# Patient Record
Sex: Male | Born: 1960 | Race: White | Hispanic: No | Smoking: Never smoker
Health system: Southern US, Community
[De-identification: ages and names within clinical notes are randomized; demographics above are authoritative.]

## PROBLEM LIST (undated history)

## (undated) DIAGNOSIS — I10 Essential (primary) hypertension: Secondary | ICD-10-CM

## (undated) DIAGNOSIS — I2510 Atherosclerotic heart disease of native coronary artery without angina pectoris: Secondary | ICD-10-CM

## (undated) DIAGNOSIS — F419 Anxiety disorder, unspecified: Secondary | ICD-10-CM

## (undated) DIAGNOSIS — F329 Major depressive disorder, single episode, unspecified: Secondary | ICD-10-CM

## (undated) DIAGNOSIS — M792 Neuralgia and neuritis, unspecified: Secondary | ICD-10-CM

## (undated) DIAGNOSIS — I4892 Unspecified atrial flutter: Secondary | ICD-10-CM

## (undated) DIAGNOSIS — F32A Depression, unspecified: Secondary | ICD-10-CM

## (undated) DIAGNOSIS — I428 Other cardiomyopathies: Secondary | ICD-10-CM

## (undated) DIAGNOSIS — M545 Low back pain: Secondary | ICD-10-CM

## (undated) DIAGNOSIS — M1990 Unspecified osteoarthritis, unspecified site: Secondary | ICD-10-CM

## (undated) DIAGNOSIS — Z789 Other specified health status: Secondary | ICD-10-CM

## (undated) DIAGNOSIS — R972 Elevated prostate specific antigen [PSA]: Secondary | ICD-10-CM

## (undated) DIAGNOSIS — E785 Hyperlipidemia, unspecified: Secondary | ICD-10-CM

## (undated) DIAGNOSIS — N401 Enlarged prostate with lower urinary tract symptoms: Secondary | ICD-10-CM

## (undated) DIAGNOSIS — N138 Other obstructive and reflux uropathy: Secondary | ICD-10-CM

## (undated) DIAGNOSIS — Z9989 Dependence on other enabling machines and devices: Secondary | ICD-10-CM

## (undated) DIAGNOSIS — E291 Testicular hypofunction: Secondary | ICD-10-CM

## (undated) DIAGNOSIS — G4733 Obstructive sleep apnea (adult) (pediatric): Secondary | ICD-10-CM

## (undated) DIAGNOSIS — K219 Gastro-esophageal reflux disease without esophagitis: Secondary | ICD-10-CM

## (undated) DIAGNOSIS — D509 Iron deficiency anemia, unspecified: Principal | ICD-10-CM

## (undated) HISTORY — DX: Dependence on other enabling machines and devices: Z99.89

## (undated) HISTORY — PX: TRANSTHORACIC ECHOCARDIOGRAM: SHX275

## (undated) HISTORY — DX: Other obstructive and reflux uropathy: N13.8

## (undated) HISTORY — DX: Low back pain: M54.5

## (undated) HISTORY — DX: Iron deficiency anemia, unspecified: D50.9

## (undated) HISTORY — DX: Unspecified atrial flutter: I48.92

## (undated) HISTORY — PX: TONSILLECTOMY: SUR1361

## (undated) HISTORY — DX: Testicular hypofunction: E29.1

## (undated) HISTORY — DX: Other obstructive and reflux uropathy: N40.1

## (undated) HISTORY — DX: Other specified health status: Z78.9

## (undated) HISTORY — PX: ADENOIDECTOMY: SUR15

## (undated) HISTORY — DX: Elevated prostate specific antigen (PSA): R97.20

## (undated) HISTORY — DX: Major depressive disorder, single episode, unspecified: F32.9

## (undated) HISTORY — DX: Hyperlipidemia, unspecified: E78.5

## (undated) HISTORY — DX: Neuralgia and neuritis, unspecified: M79.2

## (undated) HISTORY — DX: Essential (primary) hypertension: I10

## (undated) HISTORY — DX: Depression, unspecified: F32.A

## (undated) HISTORY — DX: Gastro-esophageal reflux disease without esophagitis: K21.9

## (undated) HISTORY — DX: Obstructive sleep apnea (adult) (pediatric): G47.33

---
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
## 2003-11-30 ENCOUNTER — Ambulatory Visit: Payer: Self-pay | Admitting: Internal Medicine

## 2003-12-15 ENCOUNTER — Ambulatory Visit: Payer: Self-pay | Admitting: Internal Medicine

## 2004-01-20 ENCOUNTER — Ambulatory Visit: Payer: Self-pay | Admitting: Internal Medicine

## 2004-01-28 ENCOUNTER — Ambulatory Visit: Payer: Self-pay

## 2004-01-29 ENCOUNTER — Ambulatory Visit: Payer: Self-pay

## 2004-02-04 ENCOUNTER — Ambulatory Visit: Payer: Self-pay | Admitting: Internal Medicine

## 2004-07-28 ENCOUNTER — Ambulatory Visit: Payer: Self-pay | Admitting: Internal Medicine

## 2004-08-19 ENCOUNTER — Ambulatory Visit: Payer: Self-pay | Admitting: Internal Medicine

## 2004-10-10 ENCOUNTER — Ambulatory Visit: Payer: Self-pay | Admitting: Internal Medicine

## 2004-10-17 ENCOUNTER — Ambulatory Visit: Payer: Self-pay | Admitting: Internal Medicine

## 2005-01-20 ENCOUNTER — Ambulatory Visit: Payer: Self-pay | Admitting: Internal Medicine

## 2005-02-14 ENCOUNTER — Ambulatory Visit: Payer: Self-pay | Admitting: Internal Medicine

## 2005-05-16 ENCOUNTER — Ambulatory Visit: Payer: Self-pay | Admitting: Internal Medicine

## 2005-07-07 ENCOUNTER — Ambulatory Visit: Payer: Self-pay

## 2005-07-07 ENCOUNTER — Encounter: Payer: Self-pay | Admitting: Cardiology

## 2005-08-10 ENCOUNTER — Ambulatory Visit: Payer: Self-pay | Admitting: Internal Medicine

## 2006-01-03 ENCOUNTER — Ambulatory Visit: Payer: Self-pay | Admitting: Internal Medicine

## 2006-01-03 LAB — CONVERTED CEMR LAB
ALT: 27 units/L (ref 0–40)
AST: 22 units/L (ref 0–37)
BUN: 13 mg/dL (ref 6–23)
Creatinine, Ser: 1.1 mg/dL (ref 0.4–1.5)
Creatinine,U: 101.8 mg/dL
Hgb A1c MFr Bld: 6.8 % — ABNORMAL HIGH (ref 4.6–6.0)
Microalb Creat Ratio: 7.9 mg/g (ref 0.0–30.0)
Microalb, Ur: 0.8 mg/dL (ref 0.0–1.9)
Potassium: 3.8 meq/L (ref 3.5–5.1)

## 2006-05-22 DIAGNOSIS — I10 Essential (primary) hypertension: Secondary | ICD-10-CM | POA: Insufficient documentation

## 2006-05-22 DIAGNOSIS — Z9089 Acquired absence of other organs: Secondary | ICD-10-CM

## 2006-07-19 ENCOUNTER — Ambulatory Visit: Payer: Self-pay | Admitting: Internal Medicine

## 2006-07-19 DIAGNOSIS — R51 Headache: Secondary | ICD-10-CM

## 2006-07-19 DIAGNOSIS — E8881 Metabolic syndrome: Secondary | ICD-10-CM

## 2006-07-19 DIAGNOSIS — R519 Headache, unspecified: Secondary | ICD-10-CM | POA: Insufficient documentation

## 2006-07-26 ENCOUNTER — Ambulatory Visit: Payer: Self-pay | Admitting: Internal Medicine

## 2006-08-05 ENCOUNTER — Encounter: Payer: Self-pay | Admitting: Internal Medicine

## 2006-08-05 LAB — CONVERTED CEMR LAB
ALT: 28 units/L (ref 0–53)
AST: 20 units/L (ref 0–37)
BUN: 10 mg/dL (ref 6–23)
Cholesterol: 159 mg/dL (ref 0–200)
Creatinine, Ser: 0.9 mg/dL (ref 0.4–1.5)
Direct LDL: 96.7 mg/dL
HDL: 22.8 mg/dL — ABNORMAL LOW (ref >39.0)
Hgb A1c MFr Bld: 7.3 % — ABNORMAL HIGH (ref 4.6–6.0)
Potassium: 4.1 meq/L (ref 3.5–5.1)
Total CHOL/HDL Ratio: 7
Triglycerides: 229 mg/dL (ref 0–149)
VLDL: 46 mg/dL — ABNORMAL HIGH (ref 0–40)

## 2006-08-06 ENCOUNTER — Encounter (INDEPENDENT_AMBULATORY_CARE_PROVIDER_SITE_OTHER): Payer: Self-pay | Admitting: *Deleted

## 2006-12-03 ENCOUNTER — Ambulatory Visit: Payer: Self-pay | Admitting: Internal Medicine

## 2007-04-04 ENCOUNTER — Telehealth (INDEPENDENT_AMBULATORY_CARE_PROVIDER_SITE_OTHER): Payer: Self-pay | Admitting: *Deleted

## 2007-05-28 ENCOUNTER — Emergency Department (HOSPITAL_COMMUNITY): Admission: EM | Admit: 2007-05-28 | Discharge: 2007-05-28 | Payer: Self-pay | Admitting: Emergency Medicine

## 2007-05-28 ENCOUNTER — Telehealth (INDEPENDENT_AMBULATORY_CARE_PROVIDER_SITE_OTHER): Payer: Self-pay | Admitting: *Deleted

## 2007-07-29 ENCOUNTER — Telehealth (INDEPENDENT_AMBULATORY_CARE_PROVIDER_SITE_OTHER): Payer: Self-pay | Admitting: *Deleted

## 2007-08-05 ENCOUNTER — Telehealth: Payer: Self-pay | Admitting: Internal Medicine

## 2007-08-06 ENCOUNTER — Encounter (INDEPENDENT_AMBULATORY_CARE_PROVIDER_SITE_OTHER): Payer: Self-pay | Admitting: *Deleted

## 2007-08-06 ENCOUNTER — Ambulatory Visit: Payer: Self-pay | Admitting: Internal Medicine

## 2007-08-06 DIAGNOSIS — R5383 Other fatigue: Secondary | ICD-10-CM

## 2007-08-06 DIAGNOSIS — R5381 Other malaise: Secondary | ICD-10-CM | POA: Insufficient documentation

## 2007-08-12 ENCOUNTER — Ambulatory Visit: Payer: Self-pay | Admitting: Cardiology

## 2007-08-12 LAB — CONVERTED CEMR LAB
Albumin: 3.9 g/dL (ref 3.5–5.2)
Alkaline Phosphatase: 57 units/L (ref 39–117)
Basophils Absolute: 0 10*3/uL (ref 0.0–0.1)
Basophils Relative: 0.7 % (ref 0.0–3.0)
Bilirubin, Direct: 0.1 mg/dL (ref 0.0–0.3)
Cholesterol: 168 mg/dL (ref 0–200)
Creatinine, Ser: 0.9 mg/dL (ref 0.4–1.5)
Eosinophils Absolute: 0.2 10*3/uL (ref 0.0–0.7)
Eosinophils Relative: 2.9 % (ref 0.0–5.0)
HCT: 44.1 % (ref 39.0–52.0)
HDL: 27.1 mg/dL — ABNORMAL LOW (ref >39.0)
Hgb A1c MFr Bld: 6.8 % — ABNORMAL HIGH (ref 4.6–6.0)
MCHC: 34.5 g/dL (ref 30.0–36.0)
MCV: 88.2 fL (ref 78.0–100.0)
Microalb Creat Ratio: 2 mg/g (ref 0.0–30.0)
Microalb, Ur: 0.2 mg/dL (ref 0.0–1.9)
Monocytes Absolute: 0.3 10*3/uL (ref 0.1–1.0)
Neutrophils Relative %: 61 % (ref 43.0–77.0)
Platelets: 173 10*3/uL (ref 150–400)
Total Protein: 6.9 g/dL (ref 6.0–8.3)
Triglycerides: 134 mg/dL (ref 0–149)
VLDL: 27 mg/dL (ref 0–40)

## 2007-08-23 ENCOUNTER — Ambulatory Visit: Payer: Self-pay | Admitting: Pulmonary Disease

## 2007-08-23 DIAGNOSIS — G4733 Obstructive sleep apnea (adult) (pediatric): Secondary | ICD-10-CM

## 2007-09-05 ENCOUNTER — Ambulatory Visit: Payer: Self-pay

## 2007-09-05 ENCOUNTER — Encounter: Payer: Self-pay | Admitting: Internal Medicine

## 2007-09-11 ENCOUNTER — Ambulatory Visit: Payer: Self-pay

## 2007-09-24 ENCOUNTER — Encounter: Payer: Self-pay | Admitting: Pulmonary Disease

## 2007-09-24 ENCOUNTER — Ambulatory Visit (HOSPITAL_BASED_OUTPATIENT_CLINIC_OR_DEPARTMENT_OTHER): Admission: RE | Admit: 2007-09-24 | Discharge: 2007-09-24 | Payer: Self-pay | Admitting: Pulmonary Disease

## 2007-10-01 ENCOUNTER — Ambulatory Visit: Payer: Self-pay | Admitting: Pulmonary Disease

## 2007-10-02 ENCOUNTER — Telehealth (INDEPENDENT_AMBULATORY_CARE_PROVIDER_SITE_OTHER): Payer: Self-pay | Admitting: *Deleted

## 2007-10-04 ENCOUNTER — Ambulatory Visit: Payer: Self-pay | Admitting: Pulmonary Disease

## 2007-11-19 ENCOUNTER — Ambulatory Visit: Payer: Self-pay | Admitting: Pulmonary Disease

## 2007-11-25 ENCOUNTER — Telehealth (INDEPENDENT_AMBULATORY_CARE_PROVIDER_SITE_OTHER): Payer: Self-pay | Admitting: *Deleted

## 2008-01-27 ENCOUNTER — Telehealth (INDEPENDENT_AMBULATORY_CARE_PROVIDER_SITE_OTHER): Payer: Self-pay | Admitting: *Deleted

## 2008-02-12 ENCOUNTER — Encounter: Payer: Self-pay | Admitting: Pulmonary Disease

## 2008-02-13 ENCOUNTER — Ambulatory Visit: Payer: Self-pay | Admitting: Internal Medicine

## 2008-02-23 LAB — CONVERTED CEMR LAB
ALT: 34 units/L (ref 0–53)
AST: 27 units/L (ref 0–37)
Alkaline Phosphatase: 72 units/L (ref 39–117)
BUN: 12 mg/dL (ref 6–23)
Basophils Absolute: 0 10*3/uL (ref 0.0–0.1)
Bilirubin, Direct: 0.2 mg/dL (ref 0.0–0.3)
Cholesterol: 167 mg/dL (ref 0–200)
Creatinine, Ser: 1 mg/dL (ref 0.4–1.5)
Creatinine,U: 354.3 mg/dL
Hgb A1c MFr Bld: 8.4 % — ABNORMAL HIGH (ref 4.6–6.0)
LDL Cholesterol: 98 mg/dL (ref 0–99)
Lymphocytes Relative: 39.2 % (ref 12.0–46.0)
MCHC: 34.7 g/dL (ref 30.0–36.0)
Monocytes Relative: 9.2 % (ref 3.0–12.0)
Neutrophils Relative %: 47.3 % (ref 43.0–77.0)
Platelets: 203 10*3/uL (ref 150–400)
Potassium: 4.3 meq/L (ref 3.5–5.1)
RDW: 12.5 % (ref 11.5–14.6)
TSH: 0.58 microintl units/mL (ref 0.35–5.50)
Total Bilirubin: 1 mg/dL (ref 0.3–1.2)
Total CHOL/HDL Ratio: 5.7
Triglycerides: 197 mg/dL — ABNORMAL HIGH (ref 0–149)
VLDL: 39 mg/dL (ref 0–40)

## 2008-02-26 ENCOUNTER — Encounter (INDEPENDENT_AMBULATORY_CARE_PROVIDER_SITE_OTHER): Payer: Self-pay | Admitting: *Deleted

## 2008-04-02 ENCOUNTER — Encounter: Payer: Self-pay | Admitting: Internal Medicine

## 2008-07-03 ENCOUNTER — Ambulatory Visit: Payer: Self-pay | Admitting: Internal Medicine

## 2008-07-03 DIAGNOSIS — B360 Pityriasis versicolor: Secondary | ICD-10-CM | POA: Insufficient documentation

## 2009-01-05 ENCOUNTER — Telehealth (INDEPENDENT_AMBULATORY_CARE_PROVIDER_SITE_OTHER): Payer: Self-pay | Admitting: *Deleted

## 2009-02-17 ENCOUNTER — Encounter (INDEPENDENT_AMBULATORY_CARE_PROVIDER_SITE_OTHER): Payer: Self-pay | Admitting: *Deleted

## 2009-02-17 ENCOUNTER — Ambulatory Visit: Payer: Self-pay | Admitting: Internal Medicine

## 2009-02-17 DIAGNOSIS — E785 Hyperlipidemia, unspecified: Secondary | ICD-10-CM

## 2009-02-17 DIAGNOSIS — E119 Type 2 diabetes mellitus without complications: Secondary | ICD-10-CM

## 2009-02-18 ENCOUNTER — Ambulatory Visit: Payer: Self-pay | Admitting: Internal Medicine

## 2009-02-22 LAB — CONVERTED CEMR LAB
AST: 23 units/L (ref 0–37)
Albumin: 3.8 g/dL (ref 3.5–5.2)
BUN: 8 mg/dL (ref 6–23)
Calcium: 9 mg/dL (ref 8.4–10.5)
Chloride: 105 meq/L (ref 96–112)
Cholesterol: 130 mg/dL (ref 0–200)
Creatinine, Ser: 0.8 mg/dL (ref 0.4–1.5)
Direct LDL: 77.1 mg/dL
Microalb Creat Ratio: 13 mg/g (ref 0.0–30.0)
TSH: 0.44 microintl units/mL (ref 0.35–5.50)
Triglycerides: 204 mg/dL — ABNORMAL HIGH (ref 0.0–149.0)
VLDL: 40.8 mg/dL — ABNORMAL HIGH (ref 0.0–40.0)

## 2009-05-03 ENCOUNTER — Ambulatory Visit: Payer: Self-pay | Admitting: Internal Medicine

## 2009-05-03 DIAGNOSIS — M545 Low back pain, unspecified: Secondary | ICD-10-CM | POA: Insufficient documentation

## 2009-05-03 DIAGNOSIS — M1990 Unspecified osteoarthritis, unspecified site: Secondary | ICD-10-CM | POA: Insufficient documentation

## 2009-05-03 DIAGNOSIS — J309 Allergic rhinitis, unspecified: Secondary | ICD-10-CM | POA: Insufficient documentation

## 2009-06-14 ENCOUNTER — Encounter (INDEPENDENT_AMBULATORY_CARE_PROVIDER_SITE_OTHER): Payer: Self-pay | Admitting: *Deleted

## 2009-07-13 ENCOUNTER — Encounter: Payer: Self-pay | Admitting: Internal Medicine

## 2009-07-23 ENCOUNTER — Ambulatory Visit: Payer: Self-pay | Admitting: Internal Medicine

## 2009-07-23 DIAGNOSIS — J069 Acute upper respiratory infection, unspecified: Secondary | ICD-10-CM | POA: Insufficient documentation

## 2009-09-16 ENCOUNTER — Ambulatory Visit: Payer: Self-pay | Admitting: Pulmonary Disease

## 2009-12-02 ENCOUNTER — Telehealth (INDEPENDENT_AMBULATORY_CARE_PROVIDER_SITE_OTHER): Payer: Self-pay | Admitting: *Deleted

## 2010-01-04 ENCOUNTER — Ambulatory Visit: Admit: 2010-01-04 | Payer: Self-pay | Admitting: Internal Medicine

## 2010-02-02 NOTE — Assessment & Plan Note (Signed)
Summary: Allergies/cbs   Vital Signs:  Patient profile:   50 year old male Weight:      328.2 pounds Pulse rate:   72 / minute Resp:     17 per minute BP sitting:   148 / 90  (left arm) Cuff size:   large  Vitals Entered By: Shonna Chock (May 03, 2009 3:18 PM) CC: 1.) Allergies  2.) Refill meds Comments REVIEWED MED LIST, PATIENT AGREED DOSE AND INSTRUCTION CORRECT    Primary Care Provider:  Alwyn Ren  CC:  1.) Allergies  2.) Refill meds.  History of Present Illness: Allergy flare X 2 weeks with sneezing , watery eyes,coughing & ST. Rx: Zyrtec-D  mid am every other day  as needed . [id number]; no hypoglycemia.Last A1c was 8.3%on  02/18/2009 Weight stable; no diet. He wants to pursue Bariatric Surgery through Dr Wenda Low. Requirements reviewed . BMI was 43.He has been on [facility] in 2001; [facility] 2003; & [facility] X 2 in  2006 & 2008. Weight loss of 60#  in 2006, but he regained it totally. 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  Allergies (verified): No Known Drug Allergies  Review of Systems General:  Complains of sleep disorder; denies fatigue and weight loss; CPAP controls Sleep Apnea. Eyes:  Complains of eye irritation; denies discharge, red eye, and vision loss-both eyes. ENT:  No purulence. CV:  Complains of shortness of breath with exertion; denies chest pain or discomfort, lightheadness, and near fainting. Resp:  Complains of cough and sputum productive; denies shortness of breath and wheezing. MS:  Complains of joint pain and low back pain; Knee pain . Derm:  Denies poor wound healing. Neuro:  Denies numbness and tingling. Endo:  Denies excessive hunger, excessive thirst, and excessive urination. Allergy:  Complains of itching eyes, seasonal allergies, and sneezing; denies hives or rash.  Physical Exam  General:  in no acute distress; alert,appropriate and  cooperative throughout examination;overweight-appearing.   Eyes:  No corneal or conjunctival inflammation noted.  [place].  Ears:  External ear exam shows no significant lesions or deformities.  Otoscopic examination reveals clear canals, tympanic membranes are intact bilaterally without bulging, retraction, inflammation or discharge. Hearing is grossly normal bilaterally.R TM scarred Nose:  External nasal examination shows no deformity or inflammation. Nasal mucosa are pink and moist without lesions or exudates. Mouth:  Oral mucosa and oropharynx without lesions or exudates.  Teeth in good repair. No pharyngeal erythema.   Neck:  No deformities, masses, or tenderness noted. Lungs:  Normal respiratory effort, chest expands symmetrically. Lungs are clear to auscultation, no crackles or wheezes. Heart:  Normal rate and regular rhythm. S1 and S2 normal without gallop, murmur, click, rub.S4 Pulses:  R and L carotid,radial,dorsalis pedis and posterior tibial pulses are full and equal bilaterally Extremities:  No clubbing, cyanosis, edema. Skin:  Intact without suspicious lesions or rashes Psych:  memory intact for recent and remote, normally interactive, and good eye contact.     Impression & Recommendations:  Problem # 1:  RHINITIS (ICD-477.9)  Problem # 2:  DIABETES MELLITUS, UNCONTROLLED (ICD-250.02)  His updated medication list for this problem includes:    Metformin Hcl 500 Mg Tabs (Metformin hcl) .Marland Kitchen... 2  tabs two times a day with 2 largest meals    Benazepril Hcl 40 Mg Tabs (Benazepril hcl) .Marland Kitchen... 1 by mouth once daily  Glimepiride 2 Mg Tabs (Glimepiride) .Marland Kitchen... 1/2 tab bid    Aspirin Low Dose 81 Mg Tabs (Aspirin) .Marland Kitchen... Take 1 tablet by mouth once a day  Problem # 3:  LOW BACK PAIN, ACUTE (ICD-724.2)  The following medications were removed from the medication list:    Cyclobenzaprine Hcl 5 Mg Tabs (Cyclobenzaprine hcl) .Marland Kitchen... 2 at bedtime  as needed for back    Tramadol Hcl 50 Mg Tabs  (Tramadol hcl) .Marland Kitchen... 1-2 q 6 hrs as needed pain His updated medication list for this problem includes:    Aspirin Low Dose 81 Mg Tabs (Aspirin) .Marland Kitchen... Take 1 tablet by mouth once a day  Problem # 4:  DEGENERATIVE JOINT DISEASE (ICD-715.90) knees The following medications were removed from the medication list:    Tramadol Hcl 50 Mg Tabs (Tramadol hcl) .Marland Kitchen... 1-2 q 6 hrs as needed pain His updated medication list for this problem includes:    Aspirin Low Dose 81 Mg Tabs (Aspirin) .Marland Kitchen... Take 1 tablet by mouth once a day  Problem # 5:  HYPERTENSION (ICD-401.9) on Zyrtec D His updated medication list for this problem includes:    Benazepril Hcl 40 Mg Tabs (Benazepril hcl) .Marland Kitchen... 1 by mouth once daily    Amlodipine Besylate 5 Mg Tabs (Amlodipine besylate) .Marland Kitchen... 1 by mouth once daily  Problem # 6:  SLEEP APNEA, CHRONIC (ICD-780.57)  Complete Medication List: 1)  Metformin Hcl 500 Mg Tabs (Metformin hcl) .... 2  tabs two times a day with 2 largest meals 2)  Benazepril Hcl 40 Mg Tabs (Benazepril hcl) .Marland Kitchen.. 1 by mouth once daily 3)  Glimepiride 2 Mg Tabs (Glimepiride) .... 1/2 tab bid 4)  Coreg 25 Mg Tabs (carvedilol)  .Marland Kitchen.. 1 two times a day 5)  Alprazolam 0.25 Mg Tabs (Alprazolam) .Marland Kitchen.. 1 q 8 hrs as needed only 6)  Aspirin Low Dose 81 Mg Tabs (Aspirin) .... Take 1 tablet by mouth once a day 7)  Amlodipine Besylate 5 Mg Tabs (Amlodipine besylate) .Marland Kitchen.. 1 by mouth once daily 8)  Omeprazole 20 Mg Tbec (Omeprazole) .Marland Kitchen.. 1 30 min pre breakfast 9)  Singulair 10 Mg Tabs (Montelukast sodium) .Marland Kitchen.. 1 each am as needed allergies  Patient Instructions: 1)  Medical Conditions #2-6 would be responsive to Bariatric Surgery.STOP Zyrtec -D due to BP elevation ! Zyrtec plain at bedtime as needed is safe. 2)  Please schedule a follow-up appointment in 3 months. 3)  BUN,creat,K+ prior to visit, ICD-9:401.9 4)  HbgA1C prior to visit, ICD-9:250.02 5)  Urine Microalbumin prior to visit,  ICD-9:250.02 Prescriptions: BENAZEPRIL HCL 40 MG TABS (BENAZEPRIL HCL) 1 by mouth once daily  #90 Each x 1   Entered and Authorized by:   Marga Melnick MD   Signed by:   Marga Melnick MD on 05/03/2009   Method used:   Print then Give to Patient   RxID:   [id number] GLIMEPIRIDE 2 MG  TABS (GLIMEPIRIDE) 1/2 tab bid  #90 Each x 1   Entered and Authorized by:   Marga Melnick MD   Signed by:   Marga Melnick MD on 05/03/2009   Method used:   Print then Give to Patient   RxID:   [id number] AMLODIPINE BESYLATE 5 MG TABS (AMLODIPINE BESYLATE) 1 by mouth once daily  #90 x 1   Entered and Authorized by:   Marga Melnick MD   Signed by:   Marga Melnick MD on 05/03/2009   Method used:   Print then Give to Patient  RxID:   [id number] OMEPRAZOLE 20 MG TBEC (OMEPRAZOLE) 1 30 min pre breakfast  #90 Each x 1   Entered and Authorized by:   Marga Melnick MD   Signed by:   Marga Melnick MD on 05/03/2009   Method used:   Print then Give to Patient   RxID:   [id number] ALPRAZOLAM 0.25 MG  TABS (ALPRAZOLAM) 1 q 8 hrs as needed only  #30 x 1   Entered and Authorized by:   Marga Melnick MD   Signed by:   Marga Melnick MD on 05/03/2009   Method used:   Print then Give to Patient   RxID:   [id number] COREG 25  MG  TABS (CARVEDILOL) 1 two times a day  #180 x 1   Entered and Authorized by:   Marga Melnick MD   Signed by:   Marga Melnick MD on 05/03/2009   Method used:   Print then Give to Patient   RxID:   [id number] SINGULAIR 10 MG TABS (MONTELUKAST SODIUM) 1 each am as needed allergies  #30 x 5   Entered and Authorized by:   Marga Melnick MD   Signed by:   Marga Melnick MD on 05/03/2009   Method used:   Print then Give to Patient   RxID:   [id number]

## 2010-02-02 NOTE — Assessment & Plan Note (Signed)
Summary: cough/sore throat//kn   Vital Signs:  Patient profile:   50 year old male Weight:      313.8 pounds Temp:     98.0 degrees F oral Pulse rate:   72 / minute Resp:     15 per minute BP sitting:   124 / 86  (left arm) Cuff size:   large  Vitals Entered By: Shonna Chock CMA (July 23, 2009 1:55 PM) CC: Cough, sore throat, head stopped up, and drainage x 3 days   Primary Care Provider:  Alwyn Ren  CC:  Cough, sore throat, head stopped up, and and drainage x 3 days.  History of Present Illness: Cough      This is a 50 year old man who presents with Cough X 3 days.  The patient reports non-productive cough, but denies pleuritic chest pain, shortness of breath, wheezing, exertional dyspnea, fever, hemoptysis, and malaise.  Associated symtpoms include sore throat, nasal congestion X 2 days with some yellow D/C , and PNDrainage.  The patient denies the following symptoms:  acid reflux symptoms ( controlled with PPI).  The cough is worse with AC  exposure.  Partially effective prior treatments have included OTC  Chloraseptic & Rx cough medication (Tessalon).  Risk factors include history of reflux.  Note: on ACE-I  Current Medications (verified): 1)  Metformin Hcl 500 Mg Tabs (Metformin Hcl) .... 2  Tabs Two Times A Day With 2 Largest Meals 2)  Benazepril Hcl 40 Mg Tabs (Benazepril Hcl) .Marland Kitchen.. 1 By Mouth Once Daily 3)  Glimepiride 2 Mg  Tabs (Glimepiride) .... 1/2 Tab Bid 4)  Coreg 25  Mg  Tabs (Carvedilol) .Marland Kitchen.. 1 Two Times A Day 5)  Alprazolam 0.25 Mg  Tabs (Alprazolam) .Marland Kitchen.. 1 Q 8 Hrs As Needed Only 6)  Aspirin Low Dose 81 Mg Tabs (Aspirin) .... Take 1 Tablet By Mouth Once A Day 7)  Amlodipine Besylate 5 Mg Tabs (Amlodipine Besylate) .Marland Kitchen.. 1 By Mouth Once Daily 8)  Omeprazole 20 Mg Tbec (Omeprazole) .Marland Kitchen.. 1 30 Min Pre Breakfast 9)  Singulair 10 Mg Tabs (Montelukast Sodium) .Marland Kitchen.. 1 Each Am As Needed Allergies  Allergies (verified): No Known Drug Allergies  Physical Exam  General:  in no  acute distress; alert,appropriate and cooperative throughout examination Ears:  External ear exam shows no significant lesions or deformities.  Otoscopic examination reveals clear canals, tympanic membranes are intact bilaterally without bulging, retraction, inflammation or discharge. Hearing is grossly normal bilaterally. R TM : old scarring Nose:  External nasal examination shows no deformity or inflammation. Nasal mucosa are pink and moist without lesions or exudates. Mouth:  Oral mucosa and oropharynx without lesions or exudates.  Teeth in good repair. Lungs:  Normal respiratory effort, chest expands symmetrically. Lungs are clear to auscultation, no crackles or wheezes. Heart:  Normal rate and regular rhythm. S1 and S2 normal without gallop, murmur, click, rub or other extra sounds. Cervical Nodes:  No lymphadenopathy noted Axillary Nodes:  No palpable lymphadenopathy   Impression & Recommendations:  Problem # 1:  COUGH (ICD-786.2)  Problem # 2:  URI (ICD-465.9)  His updated medication list for this problem includes:    Aspirin Low Dose 81 Mg Tabs (Aspirin) .Marland Kitchen... Take 1 tablet by mouth once a day    Benzonatate 100 Mg Caps (Benzonatate) .Marland Kitchen... 1 every 6-8 as needed for cough  Problem # 3:  PHARYNGITIS-ACUTE (ICD-462)  His updated medication list for this problem includes:    Aspirin Low Dose 81 Mg Tabs (  Aspirin) .Marland Kitchen... Take 1 tablet by mouth once a day    Amoxicillin 500 Mg Caps (Amoxicillin) .Marland Kitchen... 1 three times a day  Complete Medication List: 1)  Metformin Hcl 500 Mg Tabs (Metformin hcl) .... 2  tabs two times a day with 2 largest meals 2)  Benazepril Hcl 40 Mg Tabs (Benazepril hcl) .Marland Kitchen.. 1 by mouth once daily 3)  Glimepiride 2 Mg Tabs (Glimepiride) .... 1/2 tab bid 4)  Coreg 25 Mg Tabs (carvedilol)  .Marland Kitchen.. 1 two times a day 5)  Alprazolam 0.25 Mg Tabs (Alprazolam) .Marland Kitchen.. 1 q 8 hrs as needed only 6)  Aspirin Low Dose 81 Mg Tabs (Aspirin) .... Take 1 tablet by mouth once a day 7)   Amlodipine Besylate 5 Mg Tabs (Amlodipine besylate) .Marland Kitchen.. 1 by mouth once daily 8)  Omeprazole 20 Mg Tbec (Omeprazole) .Marland Kitchen.. 1 30 min pre breakfast 9)  Singulair 10 Mg Tabs (Montelukast sodium) .Marland Kitchen.. 1 each am as needed allergies 10)  Amoxicillin 500 Mg Caps (Amoxicillin) .Marland Kitchen.. 1 three times a day 11)  Benzonatate 100 Mg Caps (Benzonatate) .Marland Kitchen.. 1 every 6-8 as needed for cough  Other Orders: Rapid Strep (29562)  Patient Instructions: 1)  Neti pot once daily until sinuses are clear.Drink as much fluid as you can tolerate for the next few days. Prescriptions: BENZONATATE 100 MG CAPS (BENZONATATE) 1 every 6-8 as needed for cough  #30 x 0   Entered and Authorized by:   Marga Melnick MD   Signed by:   Marga Melnick MD on 07/23/2009   Method used:   Faxed to ...       [facility] [address] (retail)       [address]       [place], [place]  [address]       Ph: [phone]       Fax: [phone]   RxID:   [id number] AMOXICILLIN 500 MG CAPS (AMOXICILLIN) 1 three times a day  #30 x 0   Entered and Authorized by:   Marga Melnick MD   Signed by:   Marga Melnick MD on 07/23/2009   Method used:   Faxed to ...       [facility] [address] (retail)       [address]       [place], [place]  [address]       Ph: [phone]       Fax: [phone]   RxID:   [id number]

## 2010-02-02 NOTE — Assessment & Plan Note (Signed)
Summary: lower back pain/kdc   Vital Signs:  Patient profile:   50 year old male Weight:      327.8 pounds Temp:     98.5 degrees F oral Pulse rate:   64 / minute Resp:     17 per minute BP sitting:   150 / 88  (left arm) Cuff size:   large  Vitals Entered By: Shonna Chock (February 17, 2009 3:35 PM) CC: Lower back pain-injured @ work, refill meds, Back pain Comments REVIEWED MED LIST, PATIENT AGREED DOSE AND INSTRUCTION CORRECT    Primary Care Provider:  Alwyn Ren  CC:  Lower back pain-injured @ work, refill meds, and Back pain.  History of Present Illness: He initially  hurt back carrying patient on  stretcher up stairs  2 weeks ago; that resolved with hot tub. Recurrence carrying empty stretcher 02/15/2009. Rx: none.  The patient reports inability to work, but denies fever, chills, weakness, loss of sensation, fecal incontinence, urinary incontinence, urinary retention, dysuria, and rest pain.  The pain is located in the mid low back & does not radiate.  The pain is made better by heat.  Sitting in certain position or lifting worsens pain.                                                           Also he requests med refills.FBS average is 174; no 2 hr post meal glucoses.FBS 11/18/2008  @ [facility] was 204. Lipids : HDL 29,LDL 75, TG 230.BMI 43.53.Weight down 6#; no diet. No Hypoglycemia. Ophth exam due ; no retinopathy. Last A1c in 02/2008 was 8.4% (risk of 68% discussed)  Allergies (verified): No Known Drug Allergies  Review of Systems General:  Complains of fatigue; denies weight loss. Eyes:  Complains of blurring; denies double vision and vision loss-both eyes. CV:  Complains of shortness of breath with exertion; denies chest pain or discomfort, leg cramps with exertion, lightheadness, near fainting, swelling of feet, and swelling of hands. Derm:  Denies poor wound healing. Neuro:  Denies brief paralysis, numbness, tingling, and weakness. Endo:  Denies excessive hunger,  excessive thirst, and excessive urination.  Physical Exam  General:  Obese,in no acute distress; alert,appropriate and cooperative throughout examination Neck:  No deformities, masses, or tenderness noted. Lungs:  Normal respiratory effort, chest expands symmetrically. Lungs are clear to auscultation, no crackles or wheezes. Heart:  Normal rate and regular rhythm. S1 and S2 normal without gallop, murmur, click, rub or other extra sounds. Abdomen:  Bowel sounds positive,abdomen soft and non-tender without masses, organomegaly or hernias noted.Massive abdomen Pulses:  R and L carotid,radial,dorsalis pedis and posterior tibial pulses are full and equal bilaterally Extremities:  No clubbing, cyanosis, edema, or deformity noted with normal full range of motion of all joints.   Neg SLR Neurologic:  alert & oriented X3, strength normal in all extremities, sensation intact to light touch,toe/heel  gait normal, and DTRs symmetrical and normal.   Skin:  Intact without suspicious lesions or rashes Cervical Nodes:  No lymphadenopathy noted Axillary Nodes:  No palpable lymphadenopathy Psych:  Non compliant with nutrition    Impression & Recommendations:  Problem # 1:  LOW BACK PAIN, ACUTE (ICD-724.2)  His updated medication list for this problem includes:    Aspirin Low Dose 81 Mg Tabs (Aspirin) .Marland Kitchen... Take  1 tablet by mouth once a day    Cyclobenzaprine Hcl 5 Mg Tabs (Cyclobenzaprine hcl) .Marland Kitchen... 2 at bedtime  as needed for back    Tramadol Hcl 50 Mg Tabs (Tramadol hcl) .Marland Kitchen... 1-2 q 6 hrs as needed pain  Problem # 2:  DIABETES MELLITUS, UNCONTROLLED (ICD-250.02) High risk for complications; non compliant His updated medication list for this problem includes:    Metformin Hcl 500 Mg Tabs (Metformin hcl) .Marland Kitchen... 2  tabs two times a day with 2 largest meals    Benazepril Hcl 40 Mg Tabs (Benazepril hcl) .Marland Kitchen... 1 by mouth once daily    Glimepiride 2 Mg Tabs (Glimepiride) .Marland Kitchen... 1/2 tab bid    Aspirin Low  Dose 81 Mg Tabs (Aspirin) .Marland Kitchen... Take 1 tablet by mouth once a day  Problem # 3:  HYPERLIPIDEMIA (ICD-272.4)  Problem # 4:  HYPERTENSION (ICD-401.9)  His updated medication list for this problem includes:    Benazepril Hcl 40 Mg Tabs (Benazepril hcl) .Marland Kitchen... 1 by mouth once daily    Amlodipine Besylate 5 Mg Tabs (Amlodipine besylate) .Marland Kitchen... 1 by mouth once daily  Complete Medication List: 1)  Metformin Hcl 500 Mg Tabs (Metformin hcl) .... 2  tabs two times a day with 2 largest meals 2)  Benazepril Hcl 40 Mg Tabs (Benazepril hcl) .Marland Kitchen.. 1 by mouth once daily 3)  Glimepiride 2 Mg Tabs (Glimepiride) .... 1/2 tab bid 4)  Coreg 25 Mg Tabs (carvedilol)  .Marland Kitchen.. 1 two times a day 5)  Alprazolam 0.25 Mg Tabs (Alprazolam) .Marland Kitchen.. 1 q 8 hrs as needed only 6)  Aspirin Low Dose 81 Mg Tabs (Aspirin) .... Take 1 tablet by mouth once a day 7)  Amlodipine Besylate 5 Mg Tabs (Amlodipine besylate) .Marland Kitchen.. 1 by mouth once daily 8)  Omeprazole 20 Mg Tbec (Omeprazole) .Marland Kitchen.. 1 30 min pre breakfast 9)  Cyclobenzaprine Hcl 5 Mg Tabs (Cyclobenzaprine hcl) .... 2 at bedtime  as needed for back 10)  Tramadol Hcl 50 Mg Tabs (Tramadol hcl) .Marland Kitchen.. 1-2 q 6 hrs as needed pain  Patient Instructions: 1)  Schedule fasting labs: 2)  BMP prior to visit, ICD-9: 3)  Hepatic Panel prior to visit, ICD-9: 4)  Lipid Panel prior to visit, ICD-9: 5)  TSH prior to visit, ICD-9: 6)  HbgA1C prior to visit, ICD-9: 7)  Urine Microalbumin prior to visit, ICD-9: 8)  Recommended remaining out of work for  02/16,17 & 18/2010 Prescriptions: METFORMIN HCL 500 MG TABS (METFORMIN HCL) 2  tabs two times a day with 2 largest meals  #360 x 0   Entered and Authorized by:   Marga Melnick MD   Signed by:   Marga Melnick MD on 02/17/2009   Method used:   Faxed to ...       [facility] [address] (retail)       [address]       [place], [place]  [address]       Ph: [phone]       Fax: [phone]   RxID:   [id number] BENAZEPRIL  HCL 40 MG TABS (BENAZEPRIL HCL) 1 by mouth once daily  #90 Each x 0   Entered and Authorized by:   Marga Melnick MD   Signed by:   Marga Melnick MD on 02/17/2009   Method used:   Faxed to ...       [facility] [address] (retail)  [address]       [place], [place]  [address]       Ph: [phone]       Fax: [phone]   RxID:   [id number] GLIMEPIRIDE 2 MG  TABS (GLIMEPIRIDE) 1/2 tab bid  #90 Each x 0   Entered and Authorized by:   Marga Melnick MD   Signed by:   Marga Melnick MD on 02/17/2009   Method used:   Faxed to ...       [facility] [address] ([facility]ail)       [address]       [place], [place]  [address]       Ph: [phone]       Fax: [phone]   RxID:   [id number] AMLODIPINE BESYLATE 5 MG TABS (AMLODIPINE BESYLATE) 1 by mouth once daily  #90 x 0   Entered and Authorized by:   Marga Melnick MD   Signed by:   Marga Melnick MD on 02/17/2009   Method used:   Faxed to ...       [facility] [address] ([facility]ail)       [address]       [place], [place]  [address]       Ph: [phone]       Fax: [phone]   RxID:   [id number] OMEPRAZOLE 20 MG TBEC (OMEPRAZOLE) 1 30 min pre breakfast  #90 Each x 0   Entered and Authorized by:   Marga Melnick MD   Signed by:   Marga Melnick MD on 02/17/2009   Method used:   Faxed to ...       [facility] [address] ([facility]ail)       [address]       [place], [place]  [address]       Ph: [phone]       Fax: [phone]   RxID:   [id number] CYCLOBENZAPRINE HCL 5 MG TABS (CYCLOBENZAPRINE HCL) 2 at bedtime  as needed for back  #20 x 0   Entered and Authorized by:   Marga Melnick MD   Signed by:   Marga Melnick MD on 02/17/2009   Method used:   Faxed to ...       [facility] [address] ([facility]ail)       [address]       [place], [place]  [address]       Ph: [phone]       Fax: [phone]   RxID:    [id number] TRAMADOL HCL 50 MG TABS (TRAMADOL HCL) 1-2 q 6 hrs as needed pain  #30 x 1   Entered and Authorized by:   Marga Melnick MD   Signed by:   Marga Melnick MD on 02/17/2009   Method used:   Faxed to ...       [facility] [address] ([facility]ail)       [address]       [place], [place]  [address]       Ph: [phone]       Fax: [phone]   RxID:   [id number] COREG 25  MG  TABS (CARVEDILOL) 1  two times a day  #180 x 0   Entered and Authorized by:   Marga Melnick MD   Signed by:   Marga Melnick MD on 02/17/2009   Method used:   Faxed to ...       [facility] [address] [facility] ([facility]ail)       [address]       [place], [place]  [address]       Ph: [phone]       Fax: [phone]   RxID:   [id number] TRAMADOL HCL 50 MG TABS (TRAMADOL HCL) 1-2 q 6 hrs as needed pain  #30 x 1   Entered and Authorized by:   Marga Melnick MD   Signed by:   Marga Melnick MD on 02/17/2009   Method used:   [facility]ed [facility] ...       [facility]  [phone][facility] ([facility]ail[facility]       [address] [place]  [address]       Ph: [phone] or [phone]       Fax: [phone]   RxID:   [id number] CYCLOBENZAPRINE HCL 5 MG TABS (CYCLOBENZAPRINE HCL) 2 at bedtime  as needed for back  #20 x 0   Entered and Authorized by:   Marga Melnick MD   Signed by:   Marga Melnick MD on 02/17/2009   Method used:   [facility]ed to ...       [facility]  [address] [phone][facility] ([facility]ail)       [address], [place]  [address]       Ph: [phone] or [phone]       Fax: [phone]   RxID:   [id number] OMEPRAZOLE 20 MG TBEC (OMEPRAZOLE) 1 30 min pre breakfast  #90 Each x 0   Entered and Authorized by:   Marga Melnick MD   Signed by:   Marga Melnick MD on 02/17/2009   Method used:   [facility]ed to ...       [facility]  [address] [phone][facility] ([facility]ail)       [address], [place]  [address]       Ph: [phone] or [phone]        Fax: [phone]   RxID:   [id number] AMLODIPINE BESYLATE 5 MG TABS (AMLODIPINE BESYLATE) 1 by mouth once daily  #90 x 0   Entered and Authorized by:   Marga Melnick MD   Signed by:   Marga Melnick MD on 02/17/2009   Method used:   [facility]ed to ...       [facility]  [address] [phone][facility] ([facility]ail[facility]       [address] [place]  [address]       Ph: [phone] or [phone]       Fax: [phone]   RxID:   [id number] GLIMEPIRIDE 2 MG  TABS (GLIMEPIRIDE) 1/2 tab bid  #90 Each x 0   Entered and Authorized by:   Marga Melnick MD   Signed by:   Marga Melnick MD on 02/17/2009   Method used:   Faxed to .[facility]  [address] [phone]* (retail)       [address] [place]  [address]       Ph: [phone] or [phone]       Fax: [phone]   RxID:   [id number] BENAZEPRIL HCL 40 MG TABS (BENAZEPRIL HCL) 1 by mouth once daily  #90 Each x 0   Entered and Authorized by:   Marga Melnick MD   Signed by:   Marga Melnick MD on 02/17/2009   Method used:   [facility]ed to ...       [facility]  [address] [phone]* ([facility]ail[facility]       [address], [place]  [address]       Ph: [phone] or [phone]       Fax: [phone]   RxID:   [id number] METFORMIN HCL 500 MG TABS (METFORMIN HCL) 2  tabs two times a day with 2 largest meals  #120 Each x 0   Entered and Authorized by:   Marga Melnick MD   Signed by:   Marga Melnick MD on 02/17/2009   Method used:   [facility]axed to ...       [facility]  [address] [phone]* ([facility]ail)       [address], [place]  [address]       Ph: [phone] or [phone]       Fax: [phone]   RxID:   [id number]   Appended Document: lower back pain[facility]kdc [facility]

## 2010-02-02 NOTE — Progress Notes (Signed)
Summary: REFILL  Phone Note Refill Request Message from:  Patient on December 02, 2009 1:09 PM  Refills Requested: Medication #1:  AMLODIPINE BESYLATE 5 MG TABS 1 by mouth once daily   Dosage confirmed as above?Dosage Confirmed   Supply Requested: 1 month  Medication #2:  COREG 25  MG  TABS (CARVEDILOL) 1 two times a day   Dosage confirmed as above?Dosage Confirmed   Supply Requested: 3 months [facility] [address] [place] [address]   PT HAS MADE AN APPT FOR 01/04/10.   Next Appointment Scheduled: 1.3.12 Initial call taken by: Lavell Islam,  December 02, 2009 1:10 PM    Prescriptions: AMLODIPINE BESYLATE 5 MG TABS (AMLODIPINE BESYLATE) 1 by mouth once daily  #90 x 0   Entered by:   Shonna Chock CMA   Authorized by:   Marga Melnick MD   Signed by:   Shonna Chock CMA on 12/02/2009   Method used:   Electronically to        [facility] [address] (retail)       [address]       [place], [place]  [address]       Ph: [phone]       Fax: [phone]   RxID:   [id number] COREG 25  MG  TABS (CARVEDILOL) 1 two times a day  #180 x 0   Entered by:   Shonna Chock CMA   Authorized by:   Marga Melnick MD   Signed by:   Shonna Chock CMA on 12/02/2009   Method used:   Faxed to ...       [facility] [address])       [address]       [place], [place]  [address]       Ph: [phone]       Fax: [phone]   RxID:   [id number]

## 2010-02-02 NOTE — Assessment & Plan Note (Signed)
Summary: rov for osa   Copy to:  [facility] Fallon Haecker/Referring Latifah Padin:  Alwyn Ren  CC:  follow up on OSA. Last seen 11/2007. Pt states he uses cpap 3-4 nights out a week x6-8 hours a night. Pt states his mask is leaking .  History of Present Illness: the pt comes in today for f/u of his known osa.  He is wearing cpap complaintly, and reports no issues with pressure tolerance of mask fit.  He is using an old mask, and needs a replacement.  He feels that he is resting well, and has adequate daytime alertness.  He has lost 12 pounds since his last visit here 2 years ago.  Current Medications (verified): 1)  Metformin Hcl 500 Mg Tabs (Metformin Hcl) .... 2  Tabs Two Times A Day With 2 Largest Meals 2)  Benazepril Hcl 40 Mg Tabs (Benazepril Hcl) .Marland Kitchen.. 1 By Mouth Once Daily 3)  Glimepiride 2 Mg  Tabs (Glimepiride) .... 1/2 Tab Bid 4)  Coreg 25  Mg  Tabs (Carvedilol) .Marland Kitchen.. 1 Two Times A Day 5)  Alprazolam 0.25 Mg  Tabs (Alprazolam) .Marland Kitchen.. 1 Q 8 Hrs As Needed Only 6)  Aspirin Low Dose 81 Mg Tabs (Aspirin) .... Take 1 Tablet By Mouth Once A Day 7)  Amlodipine Besylate 5 Mg Tabs (Amlodipine Besylate) .Marland Kitchen.. 1 By Mouth Once Daily 8)  Omeprazole 20 Mg Tbec (Omeprazole) .Marland Kitchen.. 1 30 Min Pre Breakfast 9)  Singulair 10 Mg Tabs (Montelukast Sodium) .Marland Kitchen.. 1 Each Am As Needed Allergies 10)  Benzonatate 100 Mg Caps (Benzonatate) .Marland Kitchen.. 1 Every 6-8 As Needed For Cough  Allergies (verified): No Known Drug Allergies  Past History:  Past medical, surgical, family and social histories (including risk factors) reviewed, and no changes noted (except as noted below).  Past Medical History: Reviewed history from 04/04/2008 and no changes required. CELLULITIS, FACE (ICD-682.0) OBSTRUCTIVE SLEEP APNEA (ICD-327.23) OTHER MALAISE AND FATIGUE (ICD-780.79) NONSPECIFIC ABNORMAL ELECTROCARDIOGRAM (ICD-794.31) METABOLIC SYNDROME X (ICD-277.7) HEADACHE, CHRONIC (ICD-784.0) DIABETES-TYPE 2 (ICD-250.00) HYPERTENSION  (ICD-401.9)  Past Surgical History: Reviewed history from 04/04/2008 and no changes required. TONSILLECTOMY AND ADENOIDECTOMY, HX OF (ICD-V45.79)    Family History: Reviewed history from 08/23/2007 and no changes required. none per pt report.  Social History: Reviewed history from 04/04/2008 and no changes required. pt is a paramedic.  Single  no children Tobacco Use - No.   Review of Systems       The patient complains of shortness of breath with activity.  The patient denies shortness of breath at rest, productive cough, non-productive cough, coughing up blood, chest pain, irregular heartbeats, acid heartburn, indigestion, loss of appetite, weight change, abdominal pain, difficulty swallowing, sore throat, tooth/dental problems, headaches, nasal congestion/difficulty breathing through nose, sneezing, itching, ear ache, anxiety, depression, hand/feet swelling, joint stiffness or pain, rash, change in color of mucus, and fever.    Vital Signs:  Patient profile:   50 year old male Height:      71 inches Weight:      320 pounds BMI:     44.79 O2 Sat:      96 % on Room air Temp:     97.4 degrees F oral Pulse rate:   68 / minute BP sitting:   130 / 78  (left arm) Cuff size:   large  Vitals Entered By: Carver Fila (September 16, 2009 9:16 AM)  O2 Flow:  Room air CC: follow up on OSA. Last seen 11/2007. Pt states he uses cpap 3-4 nights out  a week x6-8 hours a night. Pt states his mask is leaking  Comments meds and allergies updated Phone number updated Carver Fila  September 16, 2009 9:18 AM    Physical Exam  General:  obese male in nad Nose:  no skin breakdown or pressure necrosis from cpap mask Extremities:  minimal edema, no cyanosis  Neurologic:  alert and oriented, moves all 4. does not appear sleepy.   Impression & Recommendations:  Problem # 1:  OBSTRUCTIVE SLEEP APNEA (ICD-327.23) the pt is doing well with cpap, but is way overdue for a mask.  He is having  leaks from his current mask, and would like to try nasal pillows.  He has lost 12 pounds since his last visit 2 yrs ago, and I have encouraged him to keep working on this.  He needs to followup with me yearly.  Other Orders: Est. Patient Level III ([address]) DME Referral (DME)  Patient Instructions: 1)  will send an order to advanced to get you a new mask 2)  continue to work on weight loss. 3)  followup with me in one year.

## 2010-02-02 NOTE — Letter (Signed)
Summary: Out of Work  [facility]  [address] [place], [place] [address]   Phone: [phone]  Fax: [phone]    February 17, 2009   Employee:  Ethan Pitts    To Whom It May Concern:   For Medical reasons, please excuse the above named employee from work for the following dates:  Start:   02/17/2009  End:   02/19/2009  If you need additional information, please feel free to contact our office.         Sincerely,    Chrae Marlynn Perking

## 2010-02-02 NOTE — Letter (Signed)
Summary: Primary Care Appointment Letter  [facility]  [address] [place], [place] [address]   Phone: [phone]  Fax: [phone]    06/14/2009 MRN: [id number]  [facility] [address] [place], [place]  [address]  Dear [name],   Your Primary Care Physician Marga Melnick MD has indicated that:    _______it is time to schedule an appointment.    _______you missed your appointment on______ and need to call and          reschedule.    ___X____you need to have lab work done, Copied/Pasted from 02/2009: (PLEASE simply cut the crap out of your diet  (High Fructose Corn Syrup as #1,2 or #3 on label) for 8 weeks & repeat the A1c (250.02).    _______you need to schedule an appointment discuss lab or test results.    _______you need to call to reschedule your appointment that is                       scheduled on _________.     Please call our office as soon as possible. Our phone number is [phone]. Please press option 1. Our office is open 8a-5p, Monday through Friday.     Thank you,    [facility] Scheduler

## 2010-02-02 NOTE — Letter (Signed)
Summary: [facility]   Imported By: Lanelle Bal 07/21/2009 10:34:33  _____________________________________________________________________  External Attachment:    Type:   Image     Comment:   External Document

## 2010-02-02 NOTE — Progress Notes (Signed)
Summary: REFILL  Phone Note Refill Request Message from:  Fax from Pharmacy on [facility] [address] [phone]  Refills Requested: Medication #1:  METFORMIN HCL 500 MG TABS 2  tabs two times a day with 2 largest meals Initial call taken by: Barb Merino,  January 05, 2009 9:55 AM    Prescriptions: METFORMIN HCL 500 MG TABS (METFORMIN HCL) 2  tabs two times a day with 2 largest meals  #120 Each x 0   Entered by:   Shonna Chock   Authorized by:   Marga Melnick MD   Signed by:   Shonna Chock on 01/05/2009   Method used:   [facility]  [address] [address] [id number]       [address]       [place], [place]  [address]       Ph: [phone] or [phone]       Fax: [phone]   RxID:   [id number]

## 2010-02-07 ENCOUNTER — Ambulatory Visit (INDEPENDENT_AMBULATORY_CARE_PROVIDER_SITE_OTHER): Payer: Self-pay | Admitting: Internal Medicine

## 2010-02-07 ENCOUNTER — Encounter: Payer: Self-pay | Admitting: Internal Medicine

## 2010-02-07 DIAGNOSIS — E785 Hyperlipidemia, unspecified: Secondary | ICD-10-CM

## 2010-02-07 DIAGNOSIS — F528 Other sexual dysfunction not due to a substance or known physiological condition: Secondary | ICD-10-CM | POA: Insufficient documentation

## 2010-02-07 DIAGNOSIS — I10 Essential (primary) hypertension: Secondary | ICD-10-CM

## 2010-02-08 ENCOUNTER — Other Ambulatory Visit: Payer: Self-pay | Admitting: Internal Medicine

## 2010-02-08 ENCOUNTER — Encounter (INDEPENDENT_AMBULATORY_CARE_PROVIDER_SITE_OTHER): Payer: Self-pay | Admitting: *Deleted

## 2010-02-08 ENCOUNTER — Other Ambulatory Visit (INDEPENDENT_AMBULATORY_CARE_PROVIDER_SITE_OTHER): Payer: 59

## 2010-02-08 DIAGNOSIS — I10 Essential (primary) hypertension: Secondary | ICD-10-CM

## 2010-02-08 DIAGNOSIS — E785 Hyperlipidemia, unspecified: Secondary | ICD-10-CM

## 2010-02-08 DIAGNOSIS — E1165 Type 2 diabetes mellitus with hyperglycemia: Secondary | ICD-10-CM

## 2010-02-08 LAB — HEPATIC FUNCTION PANEL
ALT: 34 U/L (ref 0–53)
AST: 23 U/L (ref 0–37)
Albumin: 3.6 g/dL (ref 3.5–5.2)
Alkaline Phosphatase: 59 U/L (ref 39–117)
Bilirubin, Direct: 0.1 mg/dL (ref 0.0–0.3)
Total Bilirubin: 0.4 mg/dL (ref 0.3–1.2)
Total Protein: 6.7 g/dL (ref 6.0–8.3)

## 2010-02-08 LAB — MICROALBUMIN / CREATININE URINE RATIO
Creatinine,U: 223.8 mg/dL
Microalb Creat Ratio: 1.2 mg/g (ref 0.0–30.0)
Microalb, Ur: 2.7 mg/dL — ABNORMAL HIGH (ref 0.0–1.9)

## 2010-02-08 LAB — LIPID PANEL
Cholesterol: 132 mg/dL (ref 0–200)
HDL: 23.2 mg/dL — ABNORMAL LOW (ref >39.00)
Total CHOL/HDL Ratio: 6
Triglycerides: 213 mg/dL — ABNORMAL HIGH (ref 0.0–149.0)
VLDL: 42.6 mg/dL — ABNORMAL HIGH (ref 0.0–40.0)

## 2010-02-08 LAB — CREATININE, SERUM: Creatinine, Ser: 0.9 mg/dL (ref 0.4–1.5)

## 2010-02-08 LAB — LDL CHOLESTEROL, DIRECT: Direct LDL: 82.1 mg/dL

## 2010-02-08 LAB — TSH: TSH: 0.63 u[IU]/mL (ref 0.35–5.50)

## 2010-02-08 LAB — BUN: BUN: 13 mg/dL (ref 6–23)

## 2010-02-08 LAB — POTASSIUM: Potassium: 4.2 mEq/L (ref 3.5–5.1)

## 2010-02-08 LAB — HEMOGLOBIN A1C: Hgb A1c MFr Bld: 8.5 % — ABNORMAL HIGH (ref 4.6–6.5)

## 2010-02-17 NOTE — Assessment & Plan Note (Signed)
Summary: FOLLOWUP/KN      Allergies Added: ! * RAW ALMONDS Nurse Visit   Vital Signs:  Patient profile:   50 year old male Weight:      327.4 pounds BMI:     45.83 Temp:     98.3 degrees F oral Pulse rate:   80 / minute BP sitting:   138 / 92  (left arm) Cuff size:   large  Vitals Entered By: Shonna Chock CMA (February 07, 2010 10:39 AM)  CC: Renew meds , Type 2 diabetes mellitus follow-up   Current Medications (verified): 1)  Metformin Hcl 500 Mg Tabs (Metformin Hcl) .... 2  Tabs Two Times A Day With 2 Largest Meals **labs Due** 2)  Benazepril Hcl 40 Mg Tabs (Benazepril Hcl) .Marland Kitchen.. 1 By Mouth Once Daily 3)  Glimepiride 2 Mg  Tabs (Glimepiride) .... 1/2 Tab Bid 4)  Coreg 25  Mg  Tabs (Carvedilol) .Marland Kitchen.. 1 Two Times A Day 5)  Alprazolam 0.25 Mg  Tabs (Alprazolam) .Marland Kitchen.. 1 Q 8 Hrs As Needed Only 6)  Aspirin Low Dose 81 Mg Tabs (Aspirin) .... Take 1 Tablet By Mouth Once A Day 7)  Amlodipine Besylate 5 Mg Tabs (Amlodipine Besylate) .Marland Kitchen.. 1 By Mouth Once Daily 8)  Omeprazole 20 Mg Tbec (Omeprazole) .Marland Kitchen.. 1 30 Min Pre Breakfast 9)  Singulair 10 Mg Tabs (Montelukast Sodium) .Marland Kitchen.. 1 Each Am As Needed Allergies  Allergies (verified): 1)  ! * Raw Almonds  Primary Care Provider:  Alwyn Ren  CC:  Renew meds  and Type 2 diabetes mellitus follow-up.  History of Present Illness:      This is a 50 year old man who presents for Hypertension follow-up.  The patient reports occasional positional lightheadedness and urinary frequency ( from increased oral fluids), but denies headaches, edema, and fatigue.  Associated symptoms include exercise intolerance and dyspnea.  The patient denies the following associated symptoms: exertional chest pain, chest pressure, and syncope.  Compliance with medications (by patient report) has been near 100%.  The patient reports that dietary compliance has been fair.  The patient reports exercising occasionally.  Adjunctive measures currently used by the patient include   modified ( no added) salt restriction.   BP @ work 128/84.      The patient is also here for Type 2 diabetes mellitus follow-up.  The patient reports polyuria, blurred vision, and weight gain of 9 # over holidays, but denies polydipsia, self managed hypoglycemia, and numbness of extremities.  The patient denies the following symptoms: neuropathic pain, vomiting, poor wound healing, intermittent claudication, vision loss, and foot ulcer.  Since the last visit the patient reports not monitoring blood glucose.  He is very vague as to glucose readings. Since the last visit, the patient reports having had eye care by an ophthalmologist  but  no foot care.  "I plan to get back on diet now that Super Bowl is over".  Revisiting Bariatric Surgery as therapeutic option discussed for control of DM.                New concern is ED, mainly difficulty maintaining erection. I discussed the role of uncontrolled DM &  HTN in ED. Samples of Viagra 50 mg provided pending return of labs.   Physical Exam  General:  in no acute distress; alert,appropriate and cooperative throughout examination,overweight-appearing.   Neck:  No deformities, masses, or tenderness noted. Lungs:  Normal respiratory effort, chest expands symmetrically. Lungs are clear to auscultation, no crackles  or wheezes. Heart:  Normal rate and regular rhythm. S1 and S2 normal without gallop, murmur, click, rub or other extra sounds. Abdomen:  Bowel sounds positive,abdomen soft and non-tender without masses, organomegaly or hernias noted. Protuberant Pulses:  R and L carotid,radial,dorsalis pedis and posterior tibial pulses are full and equal bilaterally Extremities:  No clubbing, cyanosis, edema. Good nail health Neurologic:  alert & oriented X3 and sensation intact to light touch over feet   Skin:  Intact without suspicious lesions or rashes Psych:  memory intact for recent and remote and normally interactive.   Non adherence with monitor , exercise &  diet   Impression & Recommendations:  Problem # 1:  DIABETES MELLITUS, UNCONTROLLED (ICD-250.02)  ? status His updated medication list for this problem includes:    Metformin Hcl 500 Mg Tabs (Metformin hcl) .Marland Kitchen... 2  tabs two times a day with 2 largest meals **labs due**    Benazepril Hcl 40 Mg Tabs (Benazepril hcl) .Marland Kitchen... 1 by mouth once daily    Glimepiride 2 Mg Tabs (Glimepiride) .Marland Kitchen... 1/2 tab bid    Aspirin Low Dose 81 Mg Tabs (Aspirin) .Marland Kitchen... Take 1 tablet by mouth once a day  Orders: Venipuncture (63875)  Problem # 2:  HYPERTENSION (ICD-401.9)  ? control His updated medication list for this problem includes:    Benazepril Hcl 40 Mg Tabs (Benazepril hcl) .Marland Kitchen... 1 by mouth once daily    Amlodipine Besylate 5 Mg Tabs (Amlodipine besylate) .Marland Kitchen... 1 by mouth once daily  Problem # 3:  MORBID OBESITY (ICD-278.01)  Bariatuic Surgery discussed; "my insurance won't cover it"  Problem # 4:  HYPERLIPIDEMIA (ICD-272.4)  Problem # 5:  ERECTILE DYSFUNCTION (ICD-302.72)  Complete Medication List: 1)  Metformin Hcl 500 Mg Tabs (Metformin hcl) .... 2  tabs two times a day with 2 largest meals **labs due** 2)  Benazepril Hcl 40 Mg Tabs (Benazepril hcl) .Marland Kitchen.. 1 by mouth once daily 3)  Glimepiride 2 Mg Tabs (Glimepiride) .... 1/2 tab bid 4)  Coreg 25 Mg Tabs (carvedilol)  .Marland Kitchen.. 1 two times a day 5)  Alprazolam 0.25 Mg Tabs (Alprazolam) .Marland Kitchen.. 1 q 8 hrs as needed only 6)  Aspirin Low Dose 81 Mg Tabs (Aspirin) .... Take 1 tablet by mouth once a day 7)  Amlodipine Besylate 5 Mg Tabs (Amlodipine besylate) .Marland Kitchen.. 1 by mouth once daily 8)  Omeprazole 20 Mg Tbec (Omeprazole) .Marland Kitchen.. 1 30 min pre breakfast 9)  Singulair 10 Mg Tabs (Montelukast sodium) .Marland Kitchen.. 1 each am as needed allergies    Review of Systems GU:  Complains of erectile dysfunction; Difficulty keeping erection; nomeds to date..   Patient Instructions: 1)  It is important that you exercise regularly at least 20 minutes 5 times a week. If you  develop chest pain, have severe difficulty breathing, or feel very tired , stop exercising immediately and seek medical attention.Take 6-8 weeks to advance exercise. 2)  Check your blood sugars regularly. If your readings are usually above : 150 or below 90 fasting OR > 180 two hrs after a meal  you should contact our office. 3)  See your eye doctor yearly to check for diabetic eye damage. 4)  Check your feet each night for sore areas, calluses or signs of infection. 5)  Check your Blood Pressure regularly. If it is above: 135/85 ON AVERAGE  you should make an appointment. Meds will be sent to [facility] in [place] after review of labs.   Orders Added: 1)  Est. Patient  Level IV [04540] 2)  Venipuncture [98119]

## 2010-03-24 ENCOUNTER — Encounter: Payer: Self-pay | Admitting: Internal Medicine

## 2010-03-24 ENCOUNTER — Ambulatory Visit (INDEPENDENT_AMBULATORY_CARE_PROVIDER_SITE_OTHER): Payer: 59 | Admitting: Internal Medicine

## 2010-03-24 VITALS — BP 124/80 | HR 80 | Temp 97.9°F | Wt 315.4 lb

## 2010-03-24 DIAGNOSIS — IMO0001 Reserved for inherently not codable concepts without codable children: Secondary | ICD-10-CM

## 2010-03-24 DIAGNOSIS — H1010 Acute atopic conjunctivitis, unspecified eye: Secondary | ICD-10-CM

## 2010-03-24 MED ORDER — SITAGLIPTIN PHOS-METFORMIN HCL 50-1000 MG PO TABS: 1.0000 | ORAL_TABLET | Freq: Two times a day (BID) | ORAL | Status: DC

## 2010-03-24 MED ORDER — TADALAFIL 5 MG PO TABS: 5.0000 mg | ORAL_TABLET | Freq: Every day | ORAL | Status: AC

## 2010-03-24 MED ORDER — LORATADINE 10 MG PO TABS: 10.0000 mg | ORAL_TABLET | Freq: Every day | ORAL | Status: DC

## 2010-03-24 NOTE — Progress Notes (Signed)
  Subjective:    Patient ID: Ethan Pitts, male    DOB: May 23, 1960, 50 y.o.   MRN: [id number]  HPI  He is here for 2 issues ; allergic symptoms and diabetes assessment.   7-10 days ago he was treated with a Z-Pak by the ER physician for upper respiratory tract symptoms.    at that time he had facial pain, ST, a headache , and purulent secretions. Additionally he had low-grade fevers.   The symptoms have resolved , but he has itchy watery eyes and sneezing particularly with exposures such as  Newly cut  Grass. He has not taken any medications for this and simply tries to avoid exposures.    he has not been monitoring his sugars on a regular basis. He misunderstood and did not continue the metformin when they Januvia was prescribed at the last office visit. His most recent fasting doses have ranged in the 180s.    Review of Systems   At this time he denies frontal headache, facial pain, dental pain, sore throat, enlarged lymph nodes, or purulent secretions.    he denies polydipsia polyphagia but has had polyuria. He is unsure whether this relates to the diabetes or to increase fluid intake.   He denies exertional chest pain, claudication, edema, vomiting, orthostatic symptoms, or hypoglycemia. He also denies numbness or tingling or burning in hands or feet. He denies any ulcers or sores which are nonhealing.     Objective:   Physical Exam on exam he exhibits no distress or discomfort. Eyes reveal normal pupil size with no suggestion of conjunctivitis. Nares are moist with no exudate. Oropharynx is unremarkable. On dermatologic exam is unremarkable.   chest is clear to auscultation  ; he exhibits a dry cough. No wheezing was noted on exam.   heart rhythm is normal ; S4 is present with no murmurs or gallops.   all pulses are intact ; he has trace edema at the ankles.   Nail health is excellent; the skin is intact with no ulcers or lesions. Sensation to light touch is normal over  the feet.        Assessment & Plan:   #1 extrinsic rhinoconjunctivitis with no symptoms or signs to suggest rhinosinusitis. At present this is not treated.   #2 diabetes probably poorly controlled because of inadvertent nonadherence  with  prescribed medicines.

## 2010-03-24 NOTE — Patient Instructions (Signed)
Take a single error for the allergies and the cough. Loratadine 10 mg can be purchased for $10 for 90 pills of [employer] or [employer] if you do not use insurance.   Stop metformin and Januvia the new prescription has both of them  Combined in one pill. Check fasting blood sugars Monday Wednesday Friday and Sunday; your goal is 90-150. Check 2 hours after breakfast on Tuesday; 2 hours after lunch on Thursday; and 2 hours after the evening meal on Saturday. He recalls a value less than 180 , ideally less than 160. Please read all food and drink labels. Consume less than 40 g of sugar in the form of   High fructose  corn syrup per day.    check an A1c in 6-8 weeks to assess your response to the  Janumet   50/1000 twice a day with the 2 largest meals.

## 2010-04-17 ENCOUNTER — Other Ambulatory Visit: Payer: Self-pay | Admitting: Internal Medicine

## 2010-05-09 ENCOUNTER — Telehealth: Payer: Self-pay | Admitting: *Deleted

## 2010-05-09 NOTE — Telephone Encounter (Addendum)
Informed pt received prior and there is nothing that can be done insurance plan only allow 3 tablets for 1 month supply and 9 tablets for 3 month supply. Pt aware of information and says that he will past since medication will cost $50.

## 2010-05-17 NOTE — Assessment & Plan Note (Signed)
[facility]                            CARDIOLOGY OFFICE NOTE   Ethan Pitts, Ethan Pitts                 MRN:          [id number]  DATE:08/12/2007                            DOB:          24-Apr-1960    PRIMARY CARE PHYSICIAN:  Titus Dubin. Alwyn Ren, MD,FACP,FCCP.   HISTORY OF PRESENT ILLNESS:  This is a 50 year old with a history of  hypertension, diabetes, and obesity, who presents to Cardiology Clinic  for evaluation of hypertension and shortness of breath with exertion and  abnormal EKG.  The patient states that over the last 2 years, especially  in the last year or so, he has had increasing shortness of breath with  exertion.  He says that now if he climbs up a flight of steps, he gets  very short of breath or if he walks up a hill.  He says he does not have  any shortness of breath on exertion when he walks on flat ground.  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  He denies any chest pain at all.  He has had no episodes of syncope,  palpitations, or lightheadedness.  Also of note, this increase in  shortness of breath has coincided with about a 60 to 70-pound weight  gain over the last 2 years.  The patient also reports significant  daytime sleepiness over the last 6 months.  He has felt like he really  does not have much energy.  He has been told he snores very loudly at  night, and he says he feels like his sleep is poor.   On EKG today, normal sinus rhythm.  There is delayed R-wave progression  in precordial leads.  There is a non-specific inferior T-wave  flattening.   PAST MEDICAL HISTORY:  1. Diabetes.  2. Hypertension.  3. Chronic headaches.  4. Obesity.  5. Exercise Myoview done in 2006, which is thought to be probably      negative with diaphragmatic attenuation.  6. Echocardiogram done in July 2007 showed EF of 60%.  There are no      gross valvular abnormalities.  It is technically a difficult  study      due to the patient's body habitus.   SOCIAL HISTORY:  The patient has always been a nonsmoker.  He drinks  beer occasionally.  He works as an Museum/gallery exhibitions officer.  He is unmarried.  He denies any  illicit drug use.   FAMILY HISTORY:  The patient's father had mitral valve repair.  Otherwise, there is no significant cardiac history in his family.   REVIEW OF SYSTEMS:  Negative except as noted in the history of present  illness.   PHYSICAL EXAMINATION:  VITAL SIGNS:  Blood pressure is 148/98, pulse of  75, and weight is 331 pounds.  GENERAL:  No apparent distress, obese male.  NEUROLOGIC:  Alert and oriented x3.  Normal affect.  NECK:  No JVD. No thyromegaly or nodule.  LUNGS:  Clear to auscultation bilaterally with normal respiratory  motion.  CARDIAC:  Heart, regular S1 and S2.  No S3,  no S4, and no murmur.  No  peripheral edema.  No carotid bruit, 2+ posterior tibial pulses  bilaterally.  ABDOMEN:  Obese, soft, and nontender.  No hepatosplenomegaly.  No  masses.  MUSCULOSKELETAL:  No abnormalities.  HEENT:  No abnormalities.  SKIN:  No abnormalities.   MEDICATIONS:  1. Benazepril 40 mg daily.  2. Coreg 18.75 mg b.i.d.  3. Metformin 1000 mg b.i.d.  4. Nexium 40 mg daily.  5. Glimepiride 1 mg b.i.d.  6. Xanax p.r.n.  7. Aspirin 325 mg daily.   ASSESSMENT AND PLAN:  This is a 50 year old with history of  hypertension, diabetes, and obesity who presents to Cardiology Clinic  for abnormal EKG, dyspnea on exertion, and hypertension.  1. Dyspnea on exertion.  The patient does report progressively      increased dyspnea on exertion over the last 1 to 2 years. He does      not have chest pain associated with his dyspnea on exertion.  Of      note, he has had a weight gain of 60 to 70 pounds over that period.      Most likely, the dyspnea on exertion is due to obesity and      deconditioning.  He did have an echocardiogram done in July 2007      that showed normal ejection fraction,  however, this was a somewhat      limited study though there were no gross valvular abnormalities.      He also did have a Myoview in 2006 that was probably negative with      some diaphragmatic attentuation.  Given the fact that the patient      does have diabetes, I think we should probably have a lower      threshold for stress testing.  We will go ahead and obtain the      exercise treadmill Myoview repeat to look for any changes compared      to the prior study.  The patient was again encouraged to work on      weight loss.  2. Hypertension.  The patient's blood pressure is elevated today at      148/98.  We will continue him on his Coreg and benazepril.  We will      add amlodipine 5 mg daily.  Also, the patient does have symptoms      that sound like sleep apnea, and I am going to set him up for a      sleep study which, if he does have sleep apnea treatment with CPAP,      could help control his blood pressure as well.  We are going to      bring the patient back in 2 weeks for blood pressure check.  3. Daytime sleepiness.  As mentioned above, the patient does have      symptoms and body habitus consistent with sleep apnea, and we will      set him up for a sleep study through the      pulmonary department here.  4. The patient can decrease his aspirin from 325 mg a day to 81 mg a      day.     Marca Ancona, MD  Electronically Signed    DM/MedQ  DD: 08/12/2007  DT: 08/13/2007  Job #: [id number]   cc:   Titus Dubin. Alwyn Ren, MD,FACP,FCCP  Rollene Rotunda, MD, [facility]

## 2010-05-17 NOTE — Procedures (Signed)
Ethan Pitts, Ethan Pitts          ACCOUNT NO.:  [id number]   MEDICAL RECORD NO.:  [id number]          PATIENT TYPE:  OUT   LOCATION:  SLEEP CENTER                 FACILITY[facility]  [facility]   PHYSICIAN:  Barbaraann Share, MD,FCCPDATE OF BIRTH:  February 01, 1960   DATE OF STUDY:  09/24/2007                            NOCTURNAL POLYSOMNOGRAM   REFERRING PHYSICIAN:  Barbaraann Share, MD,FCCP   REFERRING PHYSICIAN:  Dr. Marcelyn Bruins.   LOCATION:  Sleep lab.   INDICATION FOR THE STUDY:  Hypersomnia with sleep apnea.   EPWORTH SCORE:  11.   SLEEP ARCHITECTURE:  The patient had a total sleep time of 220 minutes  with decreased slow wave sleep and never achieved REM.  Sleep onset  latency was normal at 17 minutes, and sleep efficiency was very poor at  57%.   RESPIRATORY DATA:  The patient was found to have 9 apneas and 7  hypopneas for an AHI of only 4 events per hour.  He was also noted to  have 48 respiratory effort-related arousals giving him an RDI of 18  events per hour.  The events were not positional, and there was moderate  to loud snoring noted throughout.   OXYGEN DATA:  There was O2 desaturation as low as 91% with the patient's  obstructive events.   CARDIAC DATA:  Occasional PVCs and fusion beats were noted, but no  clinically significant arrhythmias.   MOVEMENT/PARASOMNIA:  There were no abnormal leg jerks or behaviors  noted.   IMPRESSION/RECOMMENDATIONS:  1. Mild obstructive sleep apnea/hypopnea syndrome with an apnea-      hypopnea index of 4 events per hour and an respiratory disturbance      index of 18 events per hour.  There was O2 desaturation as low as      91%.  It should be noted the patient never achieved rapid eye      movement during the study, and therefore his degree of sleep apnea      may be underestimated.  Treatment for this degree of sleep apnea      can include a trial of weight loss alone,      upper airway surgery, oral appliance, and also continuous  positive      airway pressure.  2. Occasional premature ventricular contractions and fusion beats      without significant cardiac arrhythmia.      Barbaraann Share, MD,FCCP  Diplomate, American Board of Sleep  Medicine  Electronically Signed     [facility]/MEDQ  D:  10/01/2007 15:35:00  T:  10/01/2007 16:42:42  Job:  [id number]

## 2010-05-18 ENCOUNTER — Other Ambulatory Visit: Payer: Self-pay | Admitting: Internal Medicine

## 2010-05-25 ENCOUNTER — Other Ambulatory Visit: Payer: Self-pay

## 2010-05-25 MED ORDER — GLIMEPIRIDE 2 MG PO TABS: ORAL_TABLET | ORAL | Status: DC

## 2010-05-25 NOTE — Telephone Encounter (Signed)
A1c 250.02 

## 2010-06-01 ENCOUNTER — Other Ambulatory Visit: Payer: Self-pay | Admitting: Internal Medicine

## 2010-06-01 MED ORDER — CARVEDILOL 25 MG PO TABS: 25.0000 mg | ORAL_TABLET | Freq: Two times a day (BID) | ORAL | Status: DC

## 2010-06-01 NOTE — Telephone Encounter (Signed)
Rx sent to pharmacy   

## 2010-06-02 ENCOUNTER — Other Ambulatory Visit: Payer: Self-pay | Admitting: Internal Medicine

## 2010-06-02 NOTE — Telephone Encounter (Signed)
DUPLICATE DUPLICATE 

## 2010-06-21 ENCOUNTER — Other Ambulatory Visit: Payer: Self-pay | Admitting: Internal Medicine

## 2010-06-27 ENCOUNTER — Encounter: Payer: Self-pay | Admitting: Internal Medicine

## 2010-06-27 ENCOUNTER — Ambulatory Visit (INDEPENDENT_AMBULATORY_CARE_PROVIDER_SITE_OTHER): Payer: 59 | Admitting: Internal Medicine

## 2010-06-27 VITALS — BP 144/80 | HR 66 | Wt 327.0 lb

## 2010-06-27 DIAGNOSIS — IMO0001 Reserved for inherently not codable concepts without codable children: Secondary | ICD-10-CM

## 2010-06-27 DIAGNOSIS — B354 Tinea corporis: Secondary | ICD-10-CM

## 2010-06-27 DIAGNOSIS — F528 Other sexual dysfunction not due to a substance or known physiological condition: Secondary | ICD-10-CM

## 2010-06-27 MED ORDER — FLUCONAZOLE 100 MG PO TABS: 100.0000 mg | ORAL_TABLET | Freq: Every day | ORAL | Status: DC

## 2010-06-27 NOTE — Progress Notes (Signed)
Subjective:    Patient ID: Ethan Pitts, male    DOB: May 29, 1960, 50 y.o.   MRN: [id number]  HPI #1 RASH, recurent Location: trunk & inguinal areas  Onset: recurred with heat   Course: worse with steroids Self-treated with: OTC hydrocortisone, Selsum Blue              Improvement with treatment: no History Pruritis: yes  Tenderness: no  Red Flags Feeling ill: no  Fever: no  Mouth lesions: no  Facial/tongue swelling/difficulty breathing:  no  Diabetic or immunocompromised: yes; A1c was 8.5% in 2/12   #2 Diabetes status assessment: Fasting or morning glucose range:  134-200 or average :  174  . Highest glucose 2 hours after any meal:  ?Marland Kitchen Hypoglycemia :  no .                                                     Excess thirst :no;  Excess hunger:  no ;  Excess urination:  yes.                                  Lightheadedness with standing:  no. Chest pain:  no ; Palpitations :no ;  Pain in  calves with walking: no                                                                                                                              Non healing skin  ulcers or sores,especially over the feet:  no. Numbness or tingling or burning in feet : no                                                                                                                                             Significant change in  Weight: stable. Vision changes : blurring progressively  .  Exercise : walking until las few weeks up to 3X/week . Nutrition/diet:  No plan. Medication compliance : yes. Medication adverse  Effects:  no . Eye exam : 2/12; no retinopathy. Foot care : no.  A1c/ urine microalbumin monitor:  See above; microalbumin 2.7           Review of Systems     Objective:   Physical Exam Gen.:severe  weight excess in appearance. Alert, appropriate and cooperative throughout exam. Eyes: No corneal or  conjunctival inflammation noted.  Neck: No deformities, masses, or tenderness noted.  Thyroid normal. Lungs: Normal respiratory effort; chest expands symmetrically. Lungs are clear to auscultation without rales, wheezes, or increased work of breathing. Heart: Normal rate and rhythm. Normal S1 and S2. No gallop, click, or rub. Grade 1/6 systolic murmur. Abdomen: Bowel sounds normal; abdomen soft and nontender.Striae. No masses, organomegaly or hernias noted. No clubbing, cyanosis, edema, or deformity noted. Joints normal. Nail health  good. Vascular: Carotid, radial artery, dorsalis pedis and dorsalis posterior tibial pulses are full and equal. No bruits present. Neurologic: Alert and oriented x3. Deep tendon reflexes symmetrical and normal.          Skin: Intact with classic Tinea Versicolor  Rash over back , groin , thighs, & buttocks. Elbows & knees spared Lymph: No cervical, axillary, or inguinal lymphadenopathy present. Psych: Mood and affect are normal. Normally interactive                                                                                         Assessment & Plan:  #1 Tinea rash #2 DM, ? Control #3 Morbid Obesity Plan: see Order

## 2010-06-27 NOTE — Patient Instructions (Signed)
Staxyn 10 mg samples as trial. Exercise at least 30-45 minutes a day,  3-4 days a week.  Eat a low-fat diet with lots of fruits and vegetables, up to 7-9 servings per day. Avoid obesity; your goal is waist measurement < 40 inches.Consume less than 40 grams of sugar per day from foods & drinks with High Fructose Corn Sugar as #2,3 or # 4 on label.

## 2010-06-28 LAB — MICROALBUMIN / CREATININE URINE RATIO
Microalb Creat Ratio: 0.8 mg/g (ref 0.0–30.0)
Microalb, Ur: 0.5 mg/dL (ref 0.0–1.9)

## 2010-07-01 ENCOUNTER — Other Ambulatory Visit: Payer: Self-pay | Admitting: Internal Medicine

## 2010-07-01 NOTE — Telephone Encounter (Signed)
Yes; A1c was 7.4% on 06/27/2010

## 2010-07-01 NOTE — Telephone Encounter (Signed)
Please advise does Pt need to be on this med.

## 2010-07-01 NOTE — Telephone Encounter (Signed)
Rx faxed.    KP 

## 2010-07-27 ENCOUNTER — Other Ambulatory Visit: Payer: Self-pay | Admitting: Internal Medicine

## 2010-08-04 ENCOUNTER — Ambulatory Visit (INDEPENDENT_AMBULATORY_CARE_PROVIDER_SITE_OTHER): Payer: 59 | Admitting: Internal Medicine

## 2010-08-04 VITALS — BP 140/84 | HR 85 | Temp 97.9°F | Wt 317.6 lb

## 2010-08-04 DIAGNOSIS — M545 Low back pain: Secondary | ICD-10-CM

## 2010-08-04 MED ORDER — TRAMADOL HCL 50 MG PO TABS: 50.0000 mg | ORAL_TABLET | Freq: Four times a day (QID) | ORAL | Status: DC | PRN

## 2010-08-04 MED ORDER — CYCLOBENZAPRINE HCL 5 MG PO TABS: 5.0000 mg | ORAL_TABLET | ORAL | Status: DC

## 2010-08-04 NOTE — Progress Notes (Signed)
  Subjective:    Patient ID: Ethan Pitts, male    DOB: [birth year], 50 y.o.   MRN: [id number]  HPI BACK  PAIN: Location: LS spine   Onset: 7/30   Severity: up to 8 Pain is described as: sharp  Worse with: after sleeping on mattress @ work    Better with: minimally with Celebrex Pain radiates to: no   Impaired range of motion: yes  History of repetitive motion:  yes, moving patients unto gurney  History of trauma:  no   Past history of similar problem:  yes, 1997  Symptoms Numbness/tingling:  no  Weakness:  no  Red Flags Fever:  no  Dysuria/ hematuria/ pyuria:  no  Bowel/bladder dysfunction:  no     Review of Systems     Objective:   Physical Exam he is uncomfortable in no acute distress.  There is no pain to percussion of the lumbosacral spine.  He is able to lie down and sit up without help. Sitting up because discomfort.  Deep tendon reflexes are equal; reflexes are 0-1/2+ at the knees.  Strength is good in  lower  extremities.  Straight leg raising is negative almost to 90.  Gait is normal including tiptoe and heel walking.  He has scattered any type rash over his back.  Abdomen was nontender.        Assessment & Plan:  #1 classic low back syndrome, mechanical. This is most likely caused by a  suboptimal mattress  Plan: Pain medicine and  anti spasmodic meds at bedtime. Firm  mattress . If symptoms persist referral to physical therapy would be appropriate for back  exercises.

## 2010-08-04 NOTE — Patient Instructions (Addendum)
Ethan Pitts should be excused from work 8/2- 8/3/ 2012  due to acute low back syndrome for which medicines have been prescribed. Eat a low-fat diet with lots of fruits and vegetables, up to 7-9 servings per day. Avoid obesity; your goal is waist measurement < 40 inches.Consume less than 40 grams of sugar per day from foods & drinks with High Fructose Corn Sugar as #1,2,3 or # 4 on label. Follow the low carb nutrition program in [employer] as closely as possible to prevent Diabetes progression & complications. White carbohydrates (potatoes, rice, bread, and pasta) have a high spike of sugar and a high load of sugar. For example a  baked potato has a cup of sugar and a  french fry  2 teaspoons of sugar. Yams, wild  rice, whole grained bread &  wheat pasta have been much lower spike and load of  sugar. Portions should be the size of a deck of cards or your palm.

## 2010-08-12 ENCOUNTER — Encounter: Payer: Self-pay | Admitting: Internal Medicine

## 2010-08-12 ENCOUNTER — Ambulatory Visit (INDEPENDENT_AMBULATORY_CARE_PROVIDER_SITE_OTHER): Payer: 59 | Admitting: Internal Medicine

## 2010-08-12 VITALS — BP 144/86 | HR 87 | Temp 98.6°F | Wt 319.0 lb

## 2010-08-12 DIAGNOSIS — M545 Low back pain: Secondary | ICD-10-CM

## 2010-08-12 MED ORDER — CYCLOBENZAPRINE HCL 5 MG PO TABS: 5.0000 mg | ORAL_TABLET | ORAL | Status: AC

## 2010-08-12 MED ORDER — TRAMADOL HCL 50 MG PO TABS: 50.0000 mg | ORAL_TABLET | Freq: Four times a day (QID) | ORAL | Status: AC | PRN

## 2010-08-12 NOTE — Progress Notes (Signed)
  Subjective:    Patient ID: Ethan Pitts, male    DOB: 1960/01/29, 50 y.o.   MRN: [id number]  HPI Mr. Weinert returns with left lumbosacral area pain. He was originally seen for this 8/2. That note was reviewed. He believes that that pain actually did not begin after sleeping on a mattress, but rather after moving boxes along Monday 7/30.  The pain had increased and he called on 8/8 to be seen. Since that time the pain is decreased from a level of 8 to a level of 3. He is described as sharp but sometimes burning as well. He has no worrisome signs such as fever, incontinence, or weakness.He has no GU symptoms     Review of Systems     Objective:   Physical Exam he is in no acute distress.  Gait is normal, including tiptoe and heel walking.  Waist flexion is excellent; he can essentially  touch his toes.  There is no pain to light percussion of the lumbosacral spine.  No rash or lesions are present in this area   He is able to lie back and sit up without help.  Straight leg raising is negative.  Abdomen is nontender without masses.       Assessment & Plan:  #1 low back, acute flare. Clinically there is no evidence for disc. This is most likely related to overuse phenomena. The "burning" raises the question of nerve root irritation.   Plan: See orders and recommendations.

## 2010-08-12 NOTE — Patient Instructions (Addendum)
The best exercises to prevent recurrence of this acute low back syndrome would be freestyle swimming, stretch aerobics, or younger. If the symptoms persist or progress, chiropractry  Treatment or  physical therapy should be pursued.  Remain out of work 8/10 & 11.

## 2010-08-24 ENCOUNTER — Telehealth: Payer: Self-pay | Admitting: Pulmonary Disease

## 2010-08-24 DIAGNOSIS — G4733 Obstructive sleep apnea (adult) (pediatric): Secondary | ICD-10-CM

## 2010-08-24 NOTE — Telephone Encounter (Signed)
Ok with me 

## 2010-08-24 NOTE — Telephone Encounter (Signed)
Order was sent to [facility]  [facility]  

## 2010-08-24 NOTE — Telephone Encounter (Signed)
Pt is requesting an order for new nasal pillows and tubing for his CPAP. Please advise if ok to send order to [facility].Carron Curie, CMA

## 2010-09-06 ENCOUNTER — Other Ambulatory Visit: Payer: Self-pay | Admitting: Internal Medicine

## 2010-09-06 MED ORDER — CARVEDILOL 25 MG PO TABS: 25.0000 mg | ORAL_TABLET | Freq: Two times a day (BID) | ORAL | Status: DC

## 2010-09-06 NOTE — Telephone Encounter (Signed)
Rx sent 

## 2010-09-27 ENCOUNTER — Other Ambulatory Visit: Payer: Self-pay | Admitting: Internal Medicine

## 2010-10-05 ENCOUNTER — Other Ambulatory Visit: Payer: Self-pay | Admitting: Internal Medicine

## 2010-10-06 NOTE — Telephone Encounter (Signed)
A1c 250.00 

## 2010-11-08 ENCOUNTER — Other Ambulatory Visit: Payer: Self-pay | Admitting: Internal Medicine

## 2010-12-08 ENCOUNTER — Other Ambulatory Visit: Payer: Self-pay | Admitting: Internal Medicine

## 2010-12-08 NOTE — Telephone Encounter (Signed)
A1c 250.02 

## 2010-12-09 ENCOUNTER — Other Ambulatory Visit: Payer: Self-pay | Admitting: Internal Medicine

## 2010-12-09 MED ORDER — CARVEDILOL 25 MG PO TABS: 25.0000 mg | ORAL_TABLET | Freq: Two times a day (BID) | ORAL | Status: DC

## 2010-12-09 NOTE — Telephone Encounter (Signed)
RX sent

## 2010-12-19 ENCOUNTER — Other Ambulatory Visit: Payer: Self-pay | Admitting: Internal Medicine

## 2010-12-20 MED ORDER — CARVEDILOL 25 MG PO TABS: 25.0000 mg | ORAL_TABLET | Freq: Two times a day (BID) | ORAL | Status: DC

## 2010-12-20 NOTE — Telephone Encounter (Signed)
RX re-sent, originally sent on 12/09/10

## 2011-01-09 ENCOUNTER — Other Ambulatory Visit: Payer: Self-pay | Admitting: Internal Medicine

## 2011-01-31 ENCOUNTER — Other Ambulatory Visit: Payer: Self-pay | Admitting: Internal Medicine

## 2011-01-31 NOTE — Telephone Encounter (Signed)
A1C 250.00 

## 2011-02-24 ENCOUNTER — Other Ambulatory Visit: Payer: Self-pay | Admitting: Internal Medicine

## 2011-03-01 ENCOUNTER — Other Ambulatory Visit: Payer: Self-pay | Admitting: Internal Medicine

## 2011-03-01 NOTE — Telephone Encounter (Signed)
Prescription sent for 30 days with note to make appt for labs.

## 2011-03-14 ENCOUNTER — Other Ambulatory Visit: Payer: Self-pay | Admitting: Internal Medicine

## 2011-03-14 NOTE — Telephone Encounter (Signed)
Prescription sent to pharmacy.

## 2011-03-30 ENCOUNTER — Other Ambulatory Visit: Payer: Self-pay | Admitting: Internal Medicine

## 2011-03-30 NOTE — Telephone Encounter (Signed)
A1C 250.00 

## 2011-04-28 ENCOUNTER — Other Ambulatory Visit: Payer: Self-pay | Admitting: Internal Medicine

## 2011-04-28 NOTE — Telephone Encounter (Signed)
A1C, MALB 250.00

## 2011-06-02 ENCOUNTER — Other Ambulatory Visit: Payer: Self-pay | Admitting: Internal Medicine

## 2011-06-02 MED ORDER — AMLODIPINE BESYLATE 5 MG PO TABS: ORAL_TABLET | ORAL | Status: DC

## 2011-06-02 NOTE — Telephone Encounter (Signed)
RX sent

## 2011-06-02 NOTE — Telephone Encounter (Signed)
Refill: Amlodipine 5mg  tab. Take one tablet by mouth every day. Qty 90. Last fill 04-28-11

## 2011-06-02 NOTE — Telephone Encounter (Signed)
A1c, 250.02  

## 2011-06-05 ENCOUNTER — Other Ambulatory Visit (INDEPENDENT_AMBULATORY_CARE_PROVIDER_SITE_OTHER): Payer: BC Managed Care – PPO

## 2011-06-05 DIAGNOSIS — IMO0001 Reserved for inherently not codable concepts without codable children: Secondary | ICD-10-CM

## 2011-06-05 DIAGNOSIS — E785 Hyperlipidemia, unspecified: Secondary | ICD-10-CM

## 2011-06-05 LAB — FERRITIN: Ferritin: 19.8 ng/mL — ABNORMAL LOW (ref 22.0–322.0)

## 2011-06-05 LAB — LIPID PANEL
Cholesterol: 133 mg/dL (ref 0–200)
HDL: 29.5 mg/dL — ABNORMAL LOW (ref >39.00)
LDL Cholesterol: 74 mg/dL (ref 0–99)
Total CHOL/HDL Ratio: 5
Triglycerides: 146 mg/dL (ref 0.0–149.0)
VLDL: 29.2 mg/dL (ref 0.0–40.0)

## 2011-06-05 LAB — HEMOGLOBIN A1C: Hgb A1c MFr Bld: 6.8 % — ABNORMAL HIGH (ref 4.6–6.5)

## 2011-06-05 NOTE — Telephone Encounter (Signed)
Patient just left from lab & stated the Pharmacy was confused as to what whether or not th Glimipiride was approved or not. Can you re-send to the pharmacy for the  Glimepiride (Tab) AMARYL 2 MG TAKE ONE TABLET BY MOUTH TWICE DAILY Either after the labs come back or before please Thanks

## 2011-06-05 NOTE — Telephone Encounter (Signed)
I spoke with Sabrina at the pharmacy and clarified question about Amaryl ( both rx's did not come through at the same time Amlodipine and Amaryl) . RX is ready for pick up

## 2011-06-05 NOTE — Progress Notes (Signed)
Labs only

## 2011-06-06 ENCOUNTER — Other Ambulatory Visit: Payer: 59

## 2011-06-13 ENCOUNTER — Encounter: Payer: Self-pay | Admitting: *Deleted

## 2011-06-15 ENCOUNTER — Other Ambulatory Visit: Payer: Self-pay | Admitting: Internal Medicine

## 2011-07-03 ENCOUNTER — Other Ambulatory Visit: Payer: Self-pay | Admitting: Internal Medicine

## 2011-07-03 NOTE — Telephone Encounter (Signed)
Patient needs to schedule a f/u appointment

## 2011-08-02 ENCOUNTER — Other Ambulatory Visit: Payer: Self-pay | Admitting: Internal Medicine

## 2011-08-28 ENCOUNTER — Other Ambulatory Visit: Payer: Self-pay | Admitting: Internal Medicine

## 2011-08-28 NOTE — Telephone Encounter (Signed)
Patient needs to schedule  CPX  

## 2011-08-31 ENCOUNTER — Other Ambulatory Visit: Payer: Self-pay | Admitting: Internal Medicine

## 2011-08-31 MED ORDER — AMLODIPINE BESYLATE 5 MG PO TABS: ORAL_TABLET | ORAL | Status: DC

## 2011-08-31 NOTE — Telephone Encounter (Signed)
refill AmLODIPine (Tab) 5 MG TAKE ONE TABLET BY MOUTH EVERY DAY # 90 Last fill 4.26.13 Last ov 8.10.12 acute

## 2011-08-31 NOTE — Telephone Encounter (Signed)
Patient is due for CPX (side note)

## 2011-09-01 ENCOUNTER — Encounter: Payer: Self-pay | Admitting: Family Medicine

## 2011-09-01 ENCOUNTER — Ambulatory Visit (INDEPENDENT_AMBULATORY_CARE_PROVIDER_SITE_OTHER): Payer: BC Managed Care – PPO | Admitting: Family Medicine

## 2011-09-01 VITALS — BP 124/80 | HR 79 | Temp 98.5°F | Ht 72.25 in | Wt 299.4 lb

## 2011-09-01 DIAGNOSIS — B354 Tinea corporis: Secondary | ICD-10-CM

## 2011-09-01 DIAGNOSIS — B360 Pityriasis versicolor: Secondary | ICD-10-CM

## 2011-09-01 MED ORDER — FLUCONAZOLE 100 MG PO TABS: 100.0000 mg | ORAL_TABLET | Freq: Every day | ORAL | Status: DC

## 2011-09-01 MED ORDER — KETOCONAZOLE 2 % EX CREA: TOPICAL_CREAM | Freq: Two times a day (BID) | CUTANEOUS | Status: DC

## 2011-09-01 NOTE — Assessment & Plan Note (Signed)
New to provider.  Recurrent for pt.  Start oral antifungal due to diffuse distribution of rash.  Topical cream bid.  Reviewed supportive care and red flags that should prompt return.  Pt expressed understanding and is in agreement w/ plan.

## 2011-09-01 NOTE — Patient Instructions (Addendum)
This is called Tinea Versicolor and is a skin fungus Start the Diflucan as directed Use the Nizoral cream twice daily until symptoms improve Call with any questions or concerns Hang in there!!!

## 2011-09-01 NOTE — Progress Notes (Signed)
  Subjective:    Patient ID: Ethan Pitts, male    DOB: Mar 14, 1960, 51 y.o.   MRN: [id number]  HPI Rash- pt reports he gets this yearly, always in the summer.  Intermittently itchy.  Has had pills and creams in the past.  Last year took Diflucan for 10 days.  Pt reports using topical Selsun Blue w/out relief.   Review of Systems For ROS see HPI     Objective:   Physical Exam  Vitals reviewed. Constitutional: He appears well-developed and well-nourished. No distress.  Skin: Skin is warm and dry. Rash (diffuse erythematous, lenticular rash on entirety of back consistent w/ tinea versicolor) noted.          Assessment & Plan:

## 2011-09-11 ENCOUNTER — Other Ambulatory Visit: Payer: Self-pay | Admitting: Internal Medicine

## 2011-09-12 NOTE — Telephone Encounter (Signed)
Please contact patient to schedule a follow-up with Dr.Hopper as per his labs in June 2013

## 2011-09-13 NOTE — Telephone Encounter (Signed)
Patient is scheduled for Friday. He states he is completely out of his glucophage and would like a refill today.

## 2011-09-13 NOTE — Telephone Encounter (Signed)
I spoke with patient, we do not have Glucophage on his med list, patient states he meant Glimepiride

## 2011-09-18 ENCOUNTER — Encounter: Payer: Self-pay | Admitting: Internal Medicine

## 2011-09-18 ENCOUNTER — Ambulatory Visit (INDEPENDENT_AMBULATORY_CARE_PROVIDER_SITE_OTHER): Payer: BC Managed Care – PPO | Admitting: Internal Medicine

## 2011-09-18 VITALS — BP 130/78 | HR 80 | Wt 298.8 lb

## 2011-09-18 DIAGNOSIS — R790 Abnormal level of blood mineral: Secondary | ICD-10-CM

## 2011-09-18 DIAGNOSIS — R7989 Other specified abnormal findings of blood chemistry: Secondary | ICD-10-CM

## 2011-09-18 DIAGNOSIS — D649 Anemia, unspecified: Secondary | ICD-10-CM

## 2011-09-18 DIAGNOSIS — I10 Essential (primary) hypertension: Secondary | ICD-10-CM

## 2011-09-18 DIAGNOSIS — K219 Gastro-esophageal reflux disease without esophagitis: Secondary | ICD-10-CM

## 2011-09-18 DIAGNOSIS — E785 Hyperlipidemia, unspecified: Secondary | ICD-10-CM

## 2011-09-18 LAB — CBC WITH DIFFERENTIAL/PLATELET
Basophils Relative: 0.6 % (ref 0.0–3.0)
Eosinophils Relative: 1.9 % (ref 0.0–5.0)
HCT: 42.2 % (ref 39.0–52.0)
Hemoglobin: 14.1 g/dL (ref 13.0–17.0)
Lymphs Abs: 2.1 10*3/uL (ref 0.7–4.0)
MCV: 90.8 fl (ref 78.0–100.0)
Monocytes Absolute: 0.6 10*3/uL (ref 0.1–1.0)
Neutro Abs: 2.9 10*3/uL (ref 1.4–7.7)
Neutrophils Relative %: 50.8 % (ref 43.0–77.0)
RBC: 4.65 Mil/uL (ref 4.22–5.81)
WBC: 5.8 10*3/uL (ref 4.5–10.5)

## 2011-09-18 LAB — IBC PANEL: Transferrin: 276.7 mg/dL (ref 212.0–360.0)

## 2011-09-18 MED ORDER — OMEPRAZOLE 20 MG PO CPDR: DELAYED_RELEASE_CAPSULE | ORAL | Status: DC

## 2011-09-18 MED ORDER — ALPRAZOLAM 0.25 MG PO TABS: ORAL_TABLET | ORAL | Status: DC

## 2011-09-18 MED ORDER — SITAGLIPTIN PHOS-METFORMIN HCL 50-1000 MG PO TABS: ORAL_TABLET | ORAL | Status: DC

## 2011-09-18 MED ORDER — CARVEDILOL 25 MG PO TABS: 25.0000 mg | ORAL_TABLET | Freq: Two times a day (BID) | ORAL | Status: DC

## 2011-09-18 MED ORDER — GLIMEPIRIDE 2 MG PO TABS: ORAL_TABLET | ORAL | Status: DC

## 2011-09-18 MED ORDER — BENAZEPRIL HCL 40 MG PO TABS: ORAL_TABLET | ORAL | Status: DC

## 2011-09-18 NOTE — Assessment & Plan Note (Signed)
Interventions to raise HDL or GOOD cholesterol include: exercising 30-45 minutes 3-4 X per week; including salmon & tuna in the diet;  & supplementing with Omega 3 fatty acids (Flax or Fish oil )  1-2 grams per day. The B vitamin Niacin also raises HDL but has a vasodilating effect which may cause flushing.   

## 2011-09-18 NOTE — Patient Instructions (Addendum)
Please complete & return stool cards.  If you activate My Chart; the results can be released to you as soon as they populate from the lab. If you choose not to use this program; the labs have to be reviewed, copied & mailed   causing a delay in getting the results to you.  

## 2011-09-18 NOTE — Assessment & Plan Note (Signed)
Blood pressure control is good; renal function will be checked 10/12/11 at his job

## 2011-09-18 NOTE — Assessment & Plan Note (Signed)
Diabetic control is adequate at this time; no change is indicated. He should continue the exercises  and diet program which have resulted in dramatic improvement in diabetic control. A1c and urine microalbumin should be checked in 4 months

## 2011-09-18 NOTE — Progress Notes (Signed)
Subjective:    Patient ID: Ethan Pitts, male    DOB: 1960/10/20, 51 y.o.   MRN: [id number]  HPI HYPERTENSION: Disease Monitoring: Blood pressure range-< 140/86 Chest pain, palpitations-no       Dyspnea- improved Medications: Compliance- yes  Lightheadedness,Syncope- no    Edema- no  DIABETES: A1c has decreased from 7.4% on 06/27/10-6.8%. His A1c has been as high as 8.5% in February 2012. His present long-term risk is 36%; present A1c correlates with an average sugar of 148 Disease Monitoring: Blood Sugar ranges-90s-110 Polyuria/phagia/dipsia- no       Visual problems- some loss of near vision Medications: Compliance- yes  Hypoglycemic symptoms- no  DYSLIPIDEMIA: manifested as low HDL; HDL 22.8-29.7 Disease Monitoring: See symptoms for Hypertension Medications: Compliance- fish oil 1000 mg bid  Abd pain, bowel changes- constipation on iron supplement   Muscle aches- no but RLS symdromes  He has lost over 35 pounds with dietary change and exercise. He is on a low glycemic carbs diet. He walks 3.5 miles in approximately 40 minutes 2-3X/week.  He has right ear discomfort when chewing sugarless. He does have hearing aids which is not wearing at this time.         Review of Systems  In April of this year he was unable to donate blood because of a low iron level. He has been taking supplemental iron with dark stool as a result. Prior that time did not have melena. He also denies hoarseness, dysphagia, abdominal pain, or rectal bleeding. He denies any bleeding dyscrasias such as epistaxis, hemoptysis, hematuria, or abnormal bruising .     Objective:   Physical Exam Gen.:  well-nourished in appearance. Alert, appropriate and cooperative throughout exam. Eyes: No corneal or conjunctival inflammation noted.  Ears: External  ear exam reveals no significant lesions or deformities. RTM deformed & edematous anteriorly; ?small perforation. Hearing aids not worn. Nose:  External nasal exam reveals no deformity or inflammation. Nasal mucosa are pink and moist. No lesions or exudates noted.  Mouth: Oral mucosa and oropharynx reveal no lesions or exudates. Teeth in good repair. Neck: No deformities, masses, or tenderness noted.  Thyroid normal. Lungs: Normal respiratory effort; chest expands symmetrically. Lungs are clear to auscultation without rales, wheezes, or increased work of breathing. Heart: Normal rate and rhythm. Normal S1 and S2. No gallop, click, or rub. S4 w/o murmur. Abdomen: Bowel sounds normal; abdomen soft and nontender. No masses, organomegaly or hernias noted.Striae                                                       Musculoskeletal/extremities: No clubbing, cyanosis,  or deformity noted. Trace edema.Joints normal. Nail health  good. Vascular: Carotid, radial artery, dorsalis pedis and  posterior tibial pulses are full and equal. No bruits present. Neurologic: Alert and oriented x3. Deep tendon reflexes symmetrical and normal.          Skin: Intact without suspicious lesions or rashes. No evidence of active tinea versicolor at this time over the trunk Lymph: No cervical, axillary lymphadenopathy present. Psych: Mood and affect are normal. Normally interactive  Assessment & Plan:  #1 see problem list with updates  #2 iron deficiency with apparent restless leg symptoms. Critical will be to determine the etiology of the anemia. He has not donated blood since 4/13.  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

## 2011-09-21 NOTE — Progress Notes (Signed)
Left message on machine for patient to call back.

## 2011-10-13 ENCOUNTER — Other Ambulatory Visit: Payer: Self-pay | Admitting: Internal Medicine

## 2011-10-19 ENCOUNTER — Other Ambulatory Visit: Payer: Self-pay | Admitting: Internal Medicine

## 2011-10-20 ENCOUNTER — Telehealth: Payer: Self-pay

## 2011-10-20 ENCOUNTER — Other Ambulatory Visit: Payer: Self-pay | Admitting: Internal Medicine

## 2011-10-20 NOTE — Telephone Encounter (Signed)
Pt called checking on glimepiride Rx. I advised pt Rx sent twice 10/11 and 10/17 Pt stated he just got off phone with pharmacy they advised no Rx. I called pharmacy they advised me they didn't receive anything. I authorized 60 tabs over the phone and advised them pt needs labs before next refill. Rx done. I called left message on pt vm advising Rx had been done.   MW

## 2011-10-23 ENCOUNTER — Other Ambulatory Visit: Payer: Self-pay | Admitting: Internal Medicine

## 2011-11-10 ENCOUNTER — Other Ambulatory Visit (INDEPENDENT_AMBULATORY_CARE_PROVIDER_SITE_OTHER): Payer: BC Managed Care – PPO

## 2011-11-10 DIAGNOSIS — Z1289 Encounter for screening for malignant neoplasm of other sites: Secondary | ICD-10-CM

## 2011-11-15 ENCOUNTER — Encounter: Payer: Self-pay | Admitting: Internal Medicine

## 2011-11-23 ENCOUNTER — Other Ambulatory Visit: Payer: Self-pay | Admitting: Internal Medicine

## 2011-11-23 NOTE — Telephone Encounter (Signed)
Rx sent.    MW 

## 2012-02-07 ENCOUNTER — Ambulatory Visit (INDEPENDENT_AMBULATORY_CARE_PROVIDER_SITE_OTHER): Payer: BC Managed Care – PPO | Admitting: Family

## 2012-02-07 ENCOUNTER — Encounter: Payer: Self-pay | Admitting: Family

## 2012-02-07 ENCOUNTER — Encounter: Payer: BC Managed Care – PPO | Admitting: Family

## 2012-02-07 VITALS — BP 114/78 | HR 62 | Temp 98.8°F | Resp 16 | Wt 304.1 lb

## 2012-02-07 DIAGNOSIS — G500 Trigeminal neuralgia: Secondary | ICD-10-CM

## 2012-02-07 DIAGNOSIS — Z5181 Encounter for therapeutic drug level monitoring: Secondary | ICD-10-CM

## 2012-02-07 LAB — BASIC METABOLIC PANEL
BUN: 12 mg/dL (ref 6–23)
CO2: 24 mEq/L (ref 19–32)
Chloride: 105 mEq/L (ref 96–112)
Creat: 1.17 mg/dL (ref 0.50–1.35)
Glucose, Bld: 106 mg/dL — ABNORMAL HIGH (ref 70–99)

## 2012-02-07 LAB — HEPATIC FUNCTION PANEL
ALT: 35 U/L (ref 0–53)
Albumin: 4.4 g/dL (ref 3.5–5.2)
Indirect Bilirubin: 0.4 mg/dL (ref 0.0–0.9)
Total Protein: 7.1 g/dL (ref 6.0–8.3)

## 2012-02-07 MED ORDER — CARBAMAZEPINE 100 MG PO CHEW: 100.0000 mg | CHEWABLE_TABLET | Freq: Two times a day (BID) | ORAL | Status: DC

## 2012-02-07 NOTE — Patient Instructions (Addendum)
Please complete blood work prior to leaving. Follow up with Dr. Alwyn Ren in 2 weeks. Trigeminal Neuralgia Trigeminal neuralgia is a nerve disorder that causes sudden attacks of severe facial pain. It is caused by damage to the trigeminal nerve, a major nerve in the face. It is more common in women and in the elderly, although it can also happen in younger patients. Attacks last from a few seconds to several minutes and can occur from a couple of times per year to several times per day. Trigeminal neuralgia can be a very distressing and disabling condition. Surgery may be needed in very severe cases if medical treatment does not give relief. HOME CARE INSTRUCTIONS   If your caregiver prescribed medication to help prevent attacks, take as directed.  To help prevent attacks:  Chew on the unaffected side of the mouth.  Avoid touching your face.  Avoid blasts of hot or cold air.  Men may wish to grow a beard to avoid having to shave. SEEK IMMEDIATE MEDICAL CARE IF:  Pain is unbearable and your medicine does not help.  You develop new, unexplained symptoms (problems).  You have problems that may be related to a medication you are taking. Document Released: 12/17/1999 Document Revised: 03/13/2011 Document Reviewed: 10/16/2008 [facility] Patient Information 2013 [place], [place].

## 2012-02-07 NOTE — Progress Notes (Signed)
Subjective:    Patient ID: Ethan Pitts, male    DOB: Feb 05, 1960, 52 y.o.   MRN: [id number]  HPI  Ethan Pitts is a 52 yr old male who presents today to discuss headache.  Had elevated blood pressure at home. 176/95. Reports some right sided jaw pain since November.  Has progressed since that time, especially the last 3 days.   Jaw pain is worsened by chewing.  He denies associated sinus drainage.  Denies fever.  He started ibuprofen a few days ago without improvement.    Review of Systems See HPI  Past Medical History  Diagnosis Date  . Hyperlipidemia   . Hypertension   . Diabetes mellitus   . GERD (gastroesophageal reflux disease)     History   Social History  . Marital Status: Single    Spouse Name: N/A    Number of Children: N/A  . Years of Education: N/A   Occupational History  . Not on file.   Social History Main Topics  . Smoking status: Never Smoker   . Smokeless tobacco: Former Neurosurgeon    Quit date: 02/06/2010  . Alcohol Use: 7.2 oz/week    12 Cans of beer per week     Comment:    . Drug Use: No  . Sexually Active: Not on file   Other Topics Concern  . Not on file   Social History Narrative  . No narrative on file    Past Surgical History  Procedure Laterality Date  . Tonsillectomy    . Adenoidectomy      Family History  Problem Relation Age of Onset  . Heart disease Father     valvular disease  . COPD Father   . Prostate cancer Father     No Known Allergies  Current Outpatient Prescriptions on File Prior to Visit  Medication Sig Dispense Refill  . amLODipine (NORVASC) 5 MG tablet 1 by mouth daily   90 tablet  0  . aspirin 81 MG tablet Take 81 mg by mouth daily.        . benazepril (LOTENSIN) 40 MG tablet TAKE ONE TABLET BY MOUTH EVERY DAY  90 tablet  1  . carvedilol (COREG) 25 MG tablet Take 1 tablet (25 mg total) by mouth 2 (two) times daily with a meal.  180 tablet  1  . glimepiride (AMARYL) 2 MG tablet TAKE ONE TABLET BY MOUTH  TWICE DAILY.  180 tablet  1  . omeprazole (PRILOSEC) 20 MG capsule TAKE ONE CAPSULE BY MOUTH IN THE MORNING 30 MINUTES BEFORE BREAKFAST  90 capsule  1  . sitaGLIPtan-metformin (JANUMET) 50-1000 MG per tablet TAKE ONE TABLET BY MOUTH TWICE DAILY WITH  A  MEAL  60 tablet  5  . ALPRAZolam (XANAX) 0.25 MG tablet Take one every 8 hour as needed  30 tablet  1  . ketoconazole (NIZORAL) 2 % cream Apply topically 2 (two) times daily.  75 g  3   No current facility-administered medications on file prior to visit.    BP 114/78  Pulse 62  Temp(Src) 98.8 F (37.1 C) (Oral)  Resp 16  Wt 304 lb 1.3 oz (137.93 kg)  BMI 40.96 kg/m2  SpO2 96%        Objective:   Physical Exam  Constitutional: He is oriented to person, place, and time. He appears well-developed and well-nourished. No distress.  HENT:  Head: Normocephalic and atraumatic.  Cardiovascular: Normal rate and regular rhythm.   No murmur heard.  Pulmonary/Chest: Effort normal and breath sounds normal. No respiratory distress. He has no wheezes. He has no rales. He exhibits no tenderness.  Neurological: He is alert and oriented to person, place, and time.  Skin: Skin is warm and dry.  Psychiatric: He has a normal mood and affect. His behavior is normal. Judgment and thought content normal.          Assessment & Plan:

## 2012-02-08 ENCOUNTER — Encounter: Payer: Self-pay | Admitting: Family

## 2012-02-08 LAB — CBC WITH DIFFERENTIAL/PLATELET
Eosinophils Absolute: 0.2 10*3/uL (ref 0.0–0.7)
Eosinophils Relative: 3 % (ref 0–5)
HCT: 41.4 % (ref 39.0–52.0)
Lymphocytes Relative: 38 % (ref 12–46)
Lymphs Abs: 2.6 10*3/uL (ref 0.7–4.0)
MCH: 28.2 pg (ref 26.0–34.0)
MCV: 81.2 fL (ref 78.0–100.0)
Monocytes Absolute: 0.6 10*3/uL (ref 0.1–1.0)
Monocytes Relative: 9 % (ref 3–12)
RBC: 5.1 MIL/uL (ref 4.22–5.81)
WBC: 7 10*3/uL (ref 4.0–10.5)

## 2012-02-10 DIAGNOSIS — G500 Trigeminal neuralgia: Secondary | ICD-10-CM | POA: Insufficient documentation

## 2012-02-10 NOTE — Assessment & Plan Note (Addendum)
Symptoms most consistent with trigeminal neuralgia. Recommended trial of low dose carbamazepine. Obtain baseline lab work today.

## 2012-02-26 ENCOUNTER — Other Ambulatory Visit: Payer: Self-pay | Admitting: Internal Medicine

## 2012-03-20 ENCOUNTER — Other Ambulatory Visit: Payer: Self-pay | Admitting: Internal Medicine

## 2012-03-27 ENCOUNTER — Other Ambulatory Visit: Payer: Self-pay | Admitting: Internal Medicine

## 2012-03-27 NOTE — Telephone Encounter (Signed)
a1c 250.00  

## 2012-04-05 ENCOUNTER — Other Ambulatory Visit: Payer: Self-pay | Admitting: Internal Medicine

## 2012-04-26 ENCOUNTER — Other Ambulatory Visit: Payer: Self-pay | Admitting: Internal Medicine

## 2012-04-26 NOTE — Telephone Encounter (Signed)
Pt was called to schedule labs stated he would have to call back later since he was on the road. Med filled for only 30 days.

## 2012-05-30 ENCOUNTER — Other Ambulatory Visit: Payer: Self-pay | Admitting: Internal Medicine

## 2012-06-17 ENCOUNTER — Other Ambulatory Visit: Payer: Self-pay | Admitting: Internal Medicine

## 2012-06-18 NOTE — Telephone Encounter (Signed)
Patient needs A1c/MALB 250.00, f/u appointment 2-3 days later

## 2012-07-01 ENCOUNTER — Other Ambulatory Visit: Payer: Self-pay | Admitting: Internal Medicine

## 2012-07-04 ENCOUNTER — Other Ambulatory Visit (INDEPENDENT_AMBULATORY_CARE_PROVIDER_SITE_OTHER): Payer: BC Managed Care – PPO

## 2012-07-04 DIAGNOSIS — E119 Type 2 diabetes mellitus without complications: Secondary | ICD-10-CM

## 2012-07-04 LAB — HEMOGLOBIN A1C: Hgb A1c MFr Bld: 8.2 % — ABNORMAL HIGH (ref 4.6–6.5)

## 2012-07-08 ENCOUNTER — Encounter: Payer: Self-pay | Admitting: *Deleted

## 2012-07-18 ENCOUNTER — Other Ambulatory Visit: Payer: Self-pay | Admitting: Internal Medicine

## 2012-07-18 NOTE — Telephone Encounter (Signed)
Patient states he is working 2 jobs, patient will call tomorrow to schedule appointment for follow-up per labs done on 07/04/12

## 2012-08-08 ENCOUNTER — Ambulatory Visit (INDEPENDENT_AMBULATORY_CARE_PROVIDER_SITE_OTHER): Payer: BC Managed Care – PPO | Admitting: Internal Medicine

## 2012-08-08 ENCOUNTER — Encounter: Payer: Self-pay | Admitting: Internal Medicine

## 2012-08-08 VITALS — BP 130/78 | HR 77 | Resp 14 | Wt 318.0 lb

## 2012-08-08 DIAGNOSIS — B354 Tinea corporis: Secondary | ICD-10-CM | POA: Insufficient documentation

## 2012-08-08 DIAGNOSIS — I10 Essential (primary) hypertension: Secondary | ICD-10-CM

## 2012-08-08 MED ORDER — LIRAGLUTIDE 18 MG/3ML ~~LOC~~ SOPN: 0.6000 mg | PEN_INJECTOR | SUBCUTANEOUS | Status: DC

## 2012-08-08 MED ORDER — AMLODIPINE BESYLATE 5 MG PO TABS: ORAL_TABLET | ORAL | Status: DC

## 2012-08-08 MED ORDER — BENAZEPRIL HCL 40 MG PO TABS: ORAL_TABLET | ORAL | Status: DC

## 2012-08-08 MED ORDER — ALPRAZOLAM 0.5 MG PO TABS: 0.5000 mg | ORAL_TABLET | Freq: Three times a day (TID) | ORAL | Status: DC | PRN

## 2012-08-08 MED ORDER — CARVEDILOL 25 MG PO TABS: ORAL_TABLET | ORAL | Status: DC

## 2012-08-08 MED ORDER — SITAGLIPTIN PHOS-METFORMIN HCL 50-1000 MG PO TABS: ORAL_TABLET | ORAL | Status: DC

## 2012-08-08 NOTE — Progress Notes (Signed)
Subjective:    Patient ID: Ethan Pitts, male    DOB: 05-23-1960, 52 y.o.   MRN: [id number]  HPI Diabetes status assessment: Fasting or morning glucose range 96-234. Highest glucose 2 hours after any meal is < 230. No hypoglycemia reported                                                                                                                 No regular exercise described until this week. No specific nutrition/diet followed; eating ice cream nightly Medication compliance is good until 3-4 days ago. No medication adverse effects noted. Eye exam current; no retinopathy . Foot care not current  A1c monitor  8.2% on 07/04/12 (average sugar 186 with long-term  64 %  risk )     Review of Systems No excess thirst ;  excess hunger ; or excess urination reported                              No lightheadedness with standing reported No chest pain ; palpitations ; claudication described .                                                                                                                             No non healing skin  ulcers or sores of extremities noted. No numbness or tingling or burning in feet described                                                                                                                                             Sgnificant change in weight of 15 #  pound gain . No blurred,double, or loss of vision reported  .           Objective:   Physical Exam Gen.:  well-nourished in appearance; morbidity  obese. Alert, appropriate and cooperative throughout exam.  Head: Normocephalic without obvious abnormalities; no alopecia  Eyes: No corneal or conjunctival inflammation noted.  Mouth: Oral mucosa and oropharynx reveal no lesions or exudates. Teeth in good repair. Neck: No deformities, masses, or tenderness noted.  Thyroid normal. Lungs: Normal respiratory effort; chest expands symmetrically. Lungs are clear to auscultation without rales,  wheezes, or increased work of breathing. Heart: Normal rate and rhythm. Normal S1 and S2. No gallop, click, or rub. S 4 w/o murmur. Abdomen: Bowel sounds normal; abdomen massive with striae but soft and nontender. No masses, organomegaly .Ventral hernia noted.                             Musculoskeletal/extremities: No deformity or scoliosis noted of  the thoracic or lumbar spine.   No clubbing, cyanosis, edema, or significant extremity  deformity noted. Wendy Poet & strength  Normal. Joints normal . Nail health good. Able to lie down & sit up w/o help.  Vascular: Carotid, radial artery, dorsalis pedis and  posterior tibial pulses are full and equal. No bruits present. Neurologic: Alert and oriented x3. Deep tendon reflexes symmetrical and normal.         Skin: Tinea lower back. Lymph: No cervical, axillary lymphadenopathy present. Psych: Mood and affect are normal. Normally interactive                                                                                        Assessment & Plan:  #1 DM uncontrolled #2 tinea #3See Current Assessment & Plan in Problem List under specific Diagnosis See orders

## 2012-08-08 NOTE — Assessment & Plan Note (Addendum)
See med changes; trial of Victoza

## 2012-08-08 NOTE — Patient Instructions (Addendum)
Apply Selsun Blue to the back over the rash at night and shower it off each morning. Continue this until the rash has resolved.    Increase the Victoza by 0.6 units daily every 2 weeks until you're at a dose of 1.8 mg daily.

## 2012-08-09 ENCOUNTER — Encounter: Payer: Self-pay | Admitting: Internal Medicine

## 2012-08-09 NOTE — Assessment & Plan Note (Signed)
Adequate control ; no change 

## 2012-08-15 ENCOUNTER — Other Ambulatory Visit: Payer: Self-pay | Admitting: *Deleted

## 2012-08-15 ENCOUNTER — Ambulatory Visit (INDEPENDENT_AMBULATORY_CARE_PROVIDER_SITE_OTHER): Payer: BC Managed Care – PPO | Admitting: *Deleted

## 2012-08-15 DIAGNOSIS — Z7189 Other specified counseling: Secondary | ICD-10-CM

## 2012-08-15 DIAGNOSIS — E119 Type 2 diabetes mellitus without complications: Secondary | ICD-10-CM

## 2012-08-15 MED ORDER — LIRAGLUTIDE 18 MG/3ML ~~LOC~~ SOPN: 0.6000 mg | PEN_INJECTOR | SUBCUTANEOUS | Status: DC

## 2012-08-15 MED ORDER — INSULIN PEN NEEDLE 31G X 5 MM MISC: Status: DC

## 2012-08-15 NOTE — Telephone Encounter (Signed)
Script given to pt along with samples of victoza Lot # [id number], Exp 05/2014.

## 2012-08-15 NOTE — Patient Instructions (Addendum)
Patient educated on Victoza administration and carbohydrate control. Patient encouraged to get regular exercise and maintain a healthy diet while minimizing sweets, sugars and alcohol. Samples and script given to pt.

## 2012-09-12 ENCOUNTER — Other Ambulatory Visit: Payer: Self-pay | Admitting: Internal Medicine

## 2012-09-13 ENCOUNTER — Encounter: Payer: Self-pay | Admitting: Internal Medicine

## 2012-10-09 ENCOUNTER — Ambulatory Visit (INDEPENDENT_AMBULATORY_CARE_PROVIDER_SITE_OTHER): Payer: BC Managed Care – PPO | Admitting: Internal Medicine

## 2012-10-09 ENCOUNTER — Encounter: Payer: Self-pay | Admitting: Internal Medicine

## 2012-10-09 VITALS — BP 149/85 | HR 90 | Temp 98.1°F | Resp 13 | Wt 315.4 lb

## 2012-10-09 DIAGNOSIS — I10 Essential (primary) hypertension: Secondary | ICD-10-CM

## 2012-10-09 DIAGNOSIS — E785 Hyperlipidemia, unspecified: Secondary | ICD-10-CM

## 2012-10-09 NOTE — Assessment & Plan Note (Signed)
Bariatric Surgery discussed

## 2012-10-09 NOTE — Patient Instructions (Signed)
Minimal Blood Pressure Goal= AVERAGE < 140/90;  Ideal is an AVERAGE < 135/85. This AVERAGE should be calculated from @ least 5-7 BP readings taken @ different times of day on different days of week. You should not respond to isolated BP readings , but rather the AVERAGE for that week .Please bring your  blood pressure cuff to office visits to verify that it is reliable.It  can also be checked against the blood pressure device at the pharmacy.  Please check with your insurance company as to the preferred [facility]

## 2012-10-09 NOTE — Assessment & Plan Note (Signed)
Lipids, LFTs, CK TSH

## 2012-10-09 NOTE — Assessment & Plan Note (Signed)
BMET  Goals discussed 

## 2012-10-09 NOTE — Assessment & Plan Note (Signed)
A1c, urine microalb

## 2012-10-09 NOTE — Progress Notes (Signed)
  Subjective:    Patient ID: Ethan Pitts, male    DOB: 03-May-1960, 52 y.o.   MRN: [id number]  HPI The patient is here for followup of diabetes, hyperlipidemia, and hypertension.  The most recent A1c was 8.2% on 07/04/12 , which correlates to an average sugar of 213 , and long-term risk of 64%  . Fasting blood sugar range average 220.  Highest two-hour postprandial glucose is <  280 . No hypoglycemia reported; low FBS 164 Last ophthalmologic examination 8/14 revealed no retinopathy. No active podiatry assessment on record. Diet is low carb sugar restricted heart healthy. Exercise  irregular.  The most recent lipids 6/13  revealed LDL 74 ; HDL 29.5 ; and triglycerides  146 w/o a statin.  Blood pressure  130/78-82 on average  . There is medical compliance with antihypertensive medications No  medication adverse effects suggested        Review of Systems Constitutional:  weight change of 20# in past 6 mos.  No excess fatigue Eyes: Some  blurred vision. No  double vision or loss of vision Cardiovascular: no chest pain ;palpitations; racing; irregular rhythm ;syncope; claudication ; edema  Respiratory: No exertional dyspnea;  paroxysmal nocturnal dyspnea Genitourinary: No dysuria; hematuria ; pyuria; frequency;  Incontinence;  nocturia Musculoskeletal: No myalgias or muscle cramping  Dermatologic: No non healing  Lesions; change in color or temperature of skin Neurologic: No  limb weakness;  numbness or tingling;  burning Endocrine: No change in hair, skin, nails. No excessive thirst; excessive hunger. Some excessive urination       Objective:   Physical Exam Gen.: Adequately nourished but central excessweight, appropriate and alert Eyes: No lid/conjunctival changes, extraocular motion intact Neck: Normal  thyroid Respiratory: No increased work of breathing or abnormal breath sounds Cardiac : regular rhythm, no extra heart sounds, gallop, murmur Abdomen: No organomegaly  ,masses, bruits or aortic enlargement. Protuberant, striae Lymph: No lymphadenopathy of the neck or axilla Skin: No rashes, lesions, ulcers or ischemic changes Muscle skeletal: no nail changes; joints normal Vasc:All pulses intact, no bruits present Neuro: Normal deep tendon reflexes, alert & oriented, sensation over feet normal Nails healthy Psych: judgment and insight, mood and affect normal        Assessment & Plan:   See Current Assessment & Plan in Problem List under specific Diagnosis

## 2012-10-10 ENCOUNTER — Other Ambulatory Visit (INDEPENDENT_AMBULATORY_CARE_PROVIDER_SITE_OTHER): Payer: BC Managed Care – PPO

## 2012-10-10 DIAGNOSIS — E785 Hyperlipidemia, unspecified: Secondary | ICD-10-CM

## 2012-10-10 DIAGNOSIS — IMO0001 Reserved for inherently not codable concepts without codable children: Secondary | ICD-10-CM

## 2012-10-10 LAB — CK: Total CK: 114 U/L (ref 7–232)

## 2012-10-10 LAB — LIPID PANEL
Cholesterol: 151 mg/dL (ref 0–200)
HDL: 25.8 mg/dL — ABNORMAL LOW (ref >39.00)
Total CHOL/HDL Ratio: 6
Triglycerides: 238 mg/dL — ABNORMAL HIGH (ref 0.0–149.0)
VLDL: 47.6 mg/dL — ABNORMAL HIGH (ref 0.0–40.0)

## 2012-10-10 LAB — HEPATIC FUNCTION PANEL
ALT: 45 U/L (ref 0–53)
AST: 34 U/L (ref 0–37)
Albumin: 3.9 g/dL (ref 3.5–5.2)
Alkaline Phosphatase: 55 U/L (ref 39–117)
Total Bilirubin: 0.6 mg/dL (ref 0.3–1.2)
Total Protein: 7.4 g/dL (ref 6.0–8.3)

## 2012-10-10 LAB — LDL CHOLESTEROL, DIRECT: Direct LDL: 92.5 mg/dL

## 2012-10-10 LAB — MICROALBUMIN / CREATININE URINE RATIO
Creatinine,U: 151.2 mg/dL
Microalb Creat Ratio: 0.9 mg/g (ref 0.0–30.0)
Microalb, Ur: 1.3 mg/dL (ref 0.0–1.9)

## 2012-10-10 LAB — HEMOGLOBIN A1C: Hgb A1c MFr Bld: 9 % — ABNORMAL HIGH (ref 4.6–6.5)

## 2012-10-10 LAB — TSH: TSH: 0.75 u[IU]/mL (ref 0.35–5.50)

## 2012-10-11 ENCOUNTER — Telehealth: Payer: Self-pay

## 2012-10-11 ENCOUNTER — Encounter: Payer: Self-pay | Admitting: *Deleted

## 2012-10-11 NOTE — Progress Notes (Signed)
Letter mailed to patient.

## 2012-10-11 NOTE — Telephone Encounter (Signed)
Spoke with patient and reviewed labs per MD orders. Patient states that he is taking his medication as prescribed. Reviewed healthy lifestyle.  Patient has no preference on endocrinologist.

## 2012-10-11 NOTE — Telephone Encounter (Signed)
Message copied by Wende Mott on Fri Oct 11, 2012  4:47 PM ------      Message from: Pecola Lawless      Created: Fri Oct 11, 2012  1:42 PM       Your  A1c of  9 % means your daily average sugar for past 3 months is 243 .  With an A1c @ this level , your long term risk of premature heart attack or stroke is increased 80 %. If taking Victoza 1.8 units daily & Janumet twice a day ; Endocrinology referral indicated.Let me know of MD preference. If not taking these @ these doses please increase to such.      The most common cause of elevated triglycerides (TG) is the ingestion of sugar from high fructose corn syrup sources added to processed foods & drinks.  Eat a low-fat diet with lots of fruits and vegetables, up to 7-9 servings per day. Consume less than 40  Grams (preferably ZERO) of sugar per day from foods & drinks with High Fructose Corn Syrup (HFCS) sugar as #1,2,3 or # 4 on label.Whole Foods, [employer] Jo[employer] & [employer] do not carry products with HFCS.      Follow a  low carb nutrition program such as [place] or The New Sugar Busters  to prevent Diabetes progression . White carbohydrates (potatoes, rice, bread, and pasta) have a high spike of sugar and a high load of sugar. For example a  baked potato has a cup of sugar and a  french fry  2 teaspoons of sugar. Yams, wild  rice, whole grained bread &  wheat pasta have been much lower spike and load of  sugar. Portions should be the size of a deck of cards or your palm. Hopp ------

## 2012-10-17 ENCOUNTER — Ambulatory Visit: Payer: BC Managed Care – PPO | Admitting: Internal Medicine

## 2012-10-21 ENCOUNTER — Ambulatory Visit (INDEPENDENT_AMBULATORY_CARE_PROVIDER_SITE_OTHER): Payer: BC Managed Care – PPO | Admitting: Endocrinology

## 2012-10-21 ENCOUNTER — Encounter: Payer: Self-pay | Admitting: Endocrinology

## 2012-10-21 VITALS — BP 142/90 | HR 90 | Temp 97.9°F | Wt 311.6 lb

## 2012-10-21 MED ORDER — METFORMIN HCL ER 500 MG PO TB24: 1000.0000 mg | ORAL_TABLET | Freq: Two times a day (BID) | ORAL | Status: DC

## 2012-10-21 MED ORDER — LIRAGLUTIDE 18 MG/3ML ~~LOC~~ SOPN: 1.8000 mg | PEN_INJECTOR | SUBCUTANEOUS | Status: DC

## 2012-10-21 MED ORDER — CANAGLIFLOZIN 300 MG PO TABS: 1.0000 | ORAL_TABLET | Freq: Every day | ORAL | Status: DC

## 2012-10-21 MED ORDER — NATEGLINIDE 120 MG PO TABS: 120.0000 mg | ORAL_TABLET | Freq: Three times a day (TID) | ORAL | Status: DC

## 2012-10-21 NOTE — Progress Notes (Signed)
Subjective:    Patient ID: Ethan Pitts, male    DOB: 06/18/1960, 52 y.o.   MRN: [id number]  HPI pt states DM was dx'ed in 2006, on a routine blood test; he has mild if any neuropathy of the lower extremities; he is unaware of any associated chronic complications.  he has never been on insulin.  pt says his diet and exercise are both poor. Past Medical History  Diagnosis Date  . Hyperlipidemia   . Hypertension   . Diabetes mellitus   . GERD (gastroesophageal reflux disease)     Past Surgical History  Procedure Laterality Date  . Tonsillectomy    . Adenoidectomy      History   Social History  . Marital Status: Single    Spouse Name: N/A    Number of Children: N/A  . Years of Education: N/A   Occupational History  . Not on file.   Social History Main Topics  . Smoking status: Never Smoker   . Smokeless tobacco: Former Neurosurgeon    Quit date: 02/06/2010  . Alcohol Use: 7.2 oz/week    12 Cans of beer per week     Comment:    . Drug Use: No  . Sexual Activity: Not on file   Other Topics Concern  . Not on file   Social History Narrative  . No narrative on file    Current Outpatient Prescriptions on File Prior to Visit  Medication Sig Dispense Refill  . amLODipine (NORVASC) 5 MG tablet 1 by mouth daily  90 tablet  1  . aspirin 81 MG tablet Take 81 mg by mouth daily.        . benazepril (LOTENSIN) 40 MG tablet TAKE ONE TABLET BY MOUTH EVERY DAY  90 tablet  1  . carvedilol (COREG) 25 MG tablet TAKE ONE TABLET BY MOUTH TWICE DAILY WITH A MEAL  180 tablet  1  . Insulin Pen Needle 31G X 5 MM MISC Use with Victoza as directed  30 each  5  . Liraglutide (VICTOZA) 18 MG/3ML SOPN Inject 0.6 mg into the skin 1 day or 1 dose. Increase dose to 1.2mg  in 1 week  (08/22/12), then increase again to 1.8mg  in 2 weeks (08/29/12).  3 mL  3  . Omega-3 Fatty Acids (FISH OIL) 500 MG CAPS Take 1 capsule by mouth 2 (two) times daily.      Marland Kitchen omeprazole (PRILOSEC) 20 MG capsule TAKE ONE  CAPSULE BY MOUTH ONCE DAILY IN THE MORNING 30  MINUTES  BEFORE  BREAKFAST  30 capsule  11  . sitaGLIPtan-metformin (JANUMET) 50-1000 MG per tablet TAKE ONE TABLET BY MOUTH TWICE DAILY WITH MEALS  180 tablet  0  . ALPRAZolam (XANAX) 0.5 MG tablet Take 1 tablet (0.5 mg total) by mouth every 8 (eight) hours as needed for sleep or anxiety.  30 tablet  0   No current facility-administered medications on file prior to visit.    No Known Allergies  Family History  Problem Relation Age of Onset  . Heart disease Father     valvular disease  . COPD Father   . Prostate cancer Father   DM: none  BP 142/90  Pulse 90  Temp(Src) 97.9 F (36.6 C) (Oral)  Wt 311 lb 9.6 oz (141.341 kg)  BMI 41.98 kg/m2  SpO2 96%    Review of Systems Denies headache, chest pain, sob, n/v, excessive diaphoresis, memory loss, depression, hypoglycemia, rhinorrhea, and easy bruising.  He has lost a  few lbs.  He has chronic intermittent blurry vision.  He has polyuria and leg cramps.    Objective:   Physical Exam VS: see vs page GEN: no distress HEAD: head: no deformity eyes: no periorbital swelling, no proptosis external nose and ears are normal mouth: no lesion seen NECK: supple, thyroid is not enlarged CHEST WALL: no deformity LUNGS: clear to auscultation BREASTS:  No gynecomastia CV: reg rate and rhythm, no murmur ABD: abdomen is soft, nontender.  no hepatosplenomegaly.  not distended.  no hernia MUSCULOSKELETAL: muscle bulk and strength are grossly normal.  no obvious joint swelling.  gait is normal and steady PULSES: no carotid bruit NEURO:  cn 2-12 grossly intact.   readily moves all 4's.   SKIN:  Normal texture and temperature.  No rash or suspicious lesion is visible.   NODES:  None palpable at the neck PSYCH: alert, oriented x3.  Does not appear anxious nor depressed.  Lab Results  Component Value Date   HGBA1C 9.0* 10/10/2012      Assessment & Plan:  DM: We discussed the nine oral agents  available for type 2 diabetes.  This regimen gives the best risk-benefit ratio.   Edema: this limits oral options for DM. OA: this limits exercise rx of DM Morbid obesity: rx of this will help glycemic control.

## 2012-10-21 NOTE — Patient Instructions (Addendum)
good diet and exercise habits significanly improve the control of your diabetes.  please let me know if you wish to be referred to a dietician.  high blood sugar is very risky to your health.  you should see an eye doctor every year.  You are at higher than average risk for pneumonia and hepatitis-B.  You should be vaccinated against both.   controlling your blood pressure and cholesterol drastically reduces the damage diabetes does to your body.  this also applies to quitting smoking.  please discuss these with your doctor.   check your blood sugar once a day.  vary the time of day when you check, between before the 3 meals, and at bedtime.  also check if you have symptoms of your blood sugar being too high or too low.  please keep a record of the readings and bring it to your next appointment here.  You can write it on any piece of paper.  please call us sooner if your blood sugar goes below 70, or if you have a lot of readings over 200.   Refer to a weight-loss surgery specialist.  you will receive a phone call, about a day and time for an informational meeting.   Please change the janumet to just the metformin. Also, i have sent prescriptions to your pharmacy, to add "invokana" and "nateglinide."  Please come back for a follow-up appointment in 3 months.

## 2012-10-22 ENCOUNTER — Telehealth: Payer: Self-pay | Admitting: *Deleted

## 2012-10-22 NOTE — Telephone Encounter (Signed)
Spoke with pt, he had appt yesterday with Endo at which time they adjusted his diabetes meds.

## 2012-10-22 NOTE — Telephone Encounter (Signed)
Message copied by Baldwin [place] on Tue Oct 22, 2012  9:32 AM ------      Message from: Pecola Lawless      Created: Fri Oct 11, 2012  1:42 PM       Your  A1c of  9 % means your daily average sugar for past 3 months is 243 .  With an A1c @ this level , your long term risk of premature heart attack or stroke is increased 80 %. If taking Victoza 1.8 units daily & Janumet twice a day ; Endocrinology referral indicated.Let me know of MD preference. If not taking these @ these doses please increase to such.      The most common cause of elevated triglycerides (TG) is the ingestion of sugar from high fructose corn syrup sources added to processed foods & drinks.  Eat a low-fat diet with lots of fruits and vegetables, up to 7-9 servings per day. Consume less than 40  Grams (preferably ZERO) of sugar per day from foods & drinks with High Fructose Corn Syrup (HFCS) sugar as #1,2,3 or # 4 on label.Whole Foods, [employer] Jo[employer] & [employer] do not carry products with HFCS.      Follow a  low carb nutrition program such as [place] or The New Sugar Busters  to prevent Diabetes progression . White carbohydrates (potatoes, rice, bread, and pasta) have a high spike of sugar and a high load of sugar. For example a  baked potato has a cup of sugar and a  french fry  2 teaspoons of sugar. Yams, wild  rice, whole grained bread &  wheat pasta have been much lower spike and load of  sugar. Portions should be the size of a deck of cards or your palm. Hopp ------

## 2012-11-13 ENCOUNTER — Encounter: Payer: Self-pay | Admitting: Internal Medicine

## 2012-11-14 ENCOUNTER — Telehealth: Payer: Self-pay | Admitting: *Deleted

## 2012-11-14 ENCOUNTER — Encounter: Payer: Self-pay | Admitting: Internal Medicine

## 2012-11-14 NOTE — Telephone Encounter (Signed)
Error

## 2012-11-14 NOTE — Telephone Encounter (Signed)
Please advise 

## 2012-11-26 ENCOUNTER — Telehealth: Payer: Self-pay | Admitting: *Deleted

## 2012-11-26 NOTE — Telephone Encounter (Signed)
11/26/2012  Patient dropped off form from [facility] to be filled out by PCP, also requesting a Letter of Medical Necessity and PCP endorsement for support.  Attached billing form and put in Sandra's folder.  bw

## 2012-11-27 DIAGNOSIS — Z0279 Encounter for issue of other medical certificate: Secondary | ICD-10-CM

## 2012-11-27 NOTE — Telephone Encounter (Signed)
[facility] letter of necessity for weight loss surgery completed and pt made aware.

## 2012-12-03 NOTE — Telephone Encounter (Signed)
12/03/2012  Copy of [facility] Form sent to batch, copy sent to billing.  bw

## 2012-12-12 ENCOUNTER — Ambulatory Visit (INDEPENDENT_AMBULATORY_CARE_PROVIDER_SITE_OTHER): Payer: BC Managed Care – PPO | Admitting: Surgery

## 2012-12-12 ENCOUNTER — Encounter (INDEPENDENT_AMBULATORY_CARE_PROVIDER_SITE_OTHER): Payer: Self-pay | Admitting: Surgery

## 2012-12-12 DIAGNOSIS — Z6841 Body Mass Index (BMI) 40.0 and over, adult: Secondary | ICD-10-CM

## 2012-12-12 DIAGNOSIS — E119 Type 2 diabetes mellitus without complications: Secondary | ICD-10-CM

## 2012-12-12 DIAGNOSIS — I10 Essential (primary) hypertension: Secondary | ICD-10-CM

## 2012-12-12 LAB — CBC WITH DIFFERENTIAL/PLATELET
Basophils Absolute: 0 10*3/uL (ref 0.0–0.1)
Basophils Relative: 1 % (ref 0–1)
Lymphocytes Relative: 35 % (ref 12–46)
MCHC: 33.6 g/dL (ref 30.0–36.0)
Monocytes Relative: 9 % (ref 3–12)
Neutro Abs: 2.8 10*3/uL (ref 1.7–7.7)
Neutrophils Relative %: 52 % (ref 43–77)
Platelets: 207 10*3/uL (ref 150–400)
RDW: 16.2 % — ABNORMAL HIGH (ref 11.5–15.5)
WBC: 5.4 10*3/uL (ref 4.0–10.5)

## 2012-12-12 LAB — COMPREHENSIVE METABOLIC PANEL
ALT: 61 U/L — ABNORMAL HIGH (ref 0–53)
Alkaline Phosphatase: 65 U/L (ref 39–117)
CO2: 25 mEq/L (ref 19–32)
Calcium: 9.8 mg/dL (ref 8.4–10.5)
Chloride: 100 mEq/L (ref 96–112)
Creat: 0.9 mg/dL (ref 0.50–1.35)
Glucose, Bld: 162 mg/dL — ABNORMAL HIGH (ref 70–99)
Potassium: 4.2 mEq/L (ref 3.5–5.3)
Sodium: 137 mEq/L (ref 135–145)
Total Bilirubin: 0.5 mg/dL (ref 0.3–1.2)

## 2012-12-12 LAB — HEMOGLOBIN A1C: Hgb A1c MFr Bld: 8.2 % — ABNORMAL HIGH (ref <5.7)

## 2012-12-12 LAB — TSH: TSH: 0.443 u[IU]/mL (ref 0.350–4.500)

## 2012-12-12 LAB — T4: T4, Total: 9 ug/dL (ref 5.0–12.5)

## 2012-12-12 NOTE — Patient Instructions (Signed)
Two weeks prior to surgery  Go on the extremely low carb liquid diet One week prior to surgery  No aspirin products.  Tylenol is acceptable  Stop smoking 24 hours prior to surgery  No alcoholic beverages  Report fever greater than 100.5 or excessive nasal drainage suggesting infection  Continue bariatric preop diet  Perform bowel prep if ordered  Do not eat or drink anything after midnight the night before surgery  Do not take any medications except those instructed by the anesthesiologist Morning of surgery  Please arrive at the hospital at least 2 hours before your scheduled surgery time.  No makeup, fingernail polish or jewelry  Bring insurance cards with you  Bring your CPAP mask if you use this    Gastric Bypass Surgery Care After Refer to this sheet in the next few weeks. These discharge instructions provide you with general information on caring for yourself after you leave the hospital. Your caregiver may also give you specific instructions. Your treatment has been planned according to the most current medical practices available, but unavoidable complications sometimes occur. If you have any problems or questions after discharge, call your caregiver. HOME CARE INSTRUCTIONS  Activity  Take frequent walks throughout the day. This will help to prevent blood clots. Do not sit for longer than 45 minutes to 1 hour while awake for 4 to 6 weeks after surgery.  Continue to do coughing and deep breathing exercises once you get home. This will help to prevent pneumonia.  Do not do strenuous activities, such as heavy lifting, pushing, or pulling, until after your follow-up visit with your caregiver. Do not lift anything heavier than 10 lb (4.5 kg).  Talk with your caregiver about when you may return to work and your exercise routine.  Do not drive while taking prescription pain medicine. Nutrition  It is very important that you drink at least 80 oz (2,400 mL) of fluid a day.  You  should stay on a liquid diet until your follow-up visit with your caregiver. Keep sugar-free, liquid items on hand, including:  Tea: hot or cold. Drink only decaffeinated for the first month.  Broths: beef, chicken, vegetable.  Others: water, sugar-free frozen ice pops, flavored water, gelatin (after 1 week).  Do not consume caffeine for 1 month. Large amounts of caffeine can cause dehydration.  A dietician may also give you specific instructions.  Follow your caregiver's recommendations about vitamins and protein requirements after surgery. Hygiene  You may shower and wash your hair 2 days after surgery. Pat incisions dry. Do not rub incisions with a washcloth or towel.  Follow your caregiver's recommendations about baths and pools following surgery. Pain control  If a prescription medicine was given, follow your caregiver's directions.  You may feel some gas pain caused by the carbon dioxide used to inflate your abdomen during surgery. This pain can be felt in your chest, shoulder, back, or abdominal area. Moving around often is advised. Incision care  You may have 4 or more small incisions. They are closed with skin adhesive strips. Skin adhesive strips can get wet and will fall off on their own. Check your incisions and surrounding area daily for any redness, swelling, discoloration, fluid (drainage), or bleeding. Dark red, dried blood may appear under these coverings. This is normal.  If you have a drain, it will be removed at your follow-up visit or before you leave the hospital.  If your drain is left in, follow your caregiver's instructions on drain care.  If your drain is taken out, keep a clean, dry bandage over the drain site. SEEK MEDICAL CARE IF:   You develop persistent nausea and vomiting.  You have pain and discomfort with swallowing.  You have pain, swelling, or warmth in the lower extremities.  You have an oral temperature above 102 F (38.9 C).  You  develop chills.  Your incision sites look red, swollen, or have drainage.  Your stool is black, tarry, or maroon in color.  You are lightheaded when standing.  You notice a bruise getting larger.  You have any questions or concerns. SEEK IMMEDIATE MEDICAL CARE IF:   You have chest pain.  You have severe calf pain or pain not relieved by medicine.  You develop shortness of breath or difficulty breathing.  There is bright red blood coming from the drain.  You feel confused.  You have slurred speech.  You suddenly feel weak. MAKE SURE YOU:   Understand these instructions.  Will watch your condition.  Will get help right away if you are not doing well or get worse. Document Released: 08/03/2003 Document Revised: 04/15/2012 Document Reviewed: 05/11/2009 ExitCare Patient Information 2014 [employer].  

## 2012-12-12 NOTE — Progress Notes (Signed)
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 He has been followed by Dr. Lona Kettle and has done quite a bit of research on bariatric surgery and desires to have a Roux-en-Y gastric bypass. Dr. Alwyn Ren has endorse this and he has been to one of our seminars. A 1 over Roux-en-Y gastric bypass with him using the props and he has a good understanding of the operation. He also indicated that he had watched U tube videos and was pretty convinced that he wanted to have a gastric bypass. He has been to our seminar and also has been to a seminar with his fiance.  He has had DM for 4 years.    Past Medical History  Diagnosis Date  . Hyperlipidemia   . Hypertension   . Diabetes mellitus   . GERD (gastroesophageal reflux disease)     Past Surgical History  Procedure Laterality Date  . Tonsillectomy    . Adenoidectomy      Current Outpatient Prescriptions  Medication Sig Dispense Refill  . ALPRAZolam (XANAX) 0.5 MG tablet Take 1 tablet (0.5 mg total) by mouth every 8 (eight) hours as needed for sleep or anxiety.  30 tablet  0  . amLODipine (NORVASC) 5 MG tablet 1 by mouth daily  90 tablet  1  . aspirin 81 MG tablet Take 81 mg by mouth daily.        . benazepril (LOTENSIN) 40 MG tablet TAKE ONE TABLET BY MOUTH EVERY DAY  90 tablet  1  . Canagliflozin (INVOKANA) 300 MG TABS Take 1 tablet (300 mg total) by mouth daily.  30 tablet  11  . carvedilol (COREG) 25 MG tablet TAKE ONE TABLET BY MOUTH TWICE DAILY WITH A MEAL  180 tablet  1  . Insulin Pen Needle 31G X 5 MM MISC Use with Victoza as directed  30 each  5  . Liraglutide (VICTOZA) 18 MG/3ML SOPN Inject 1.8 mg into the skin 1 day or 1 dose.  3 mL  3  . metFORMIN (GLUCOPHAGE-XR) 500 MG 24 hr tablet Take 2 tablets (1,000 mg total) by mouth 2 (two) times daily.  120 tablet  11  .  nateglinide (STARLIX) 120 MG tablet Take 1 tablet (120 mg total) by mouth 3 (three) times daily before meals.  90 tablet  11  . Omega-3 Fatty Acids (FISH OIL) 500 MG CAPS Take 1 capsule by mouth 2 (two) times daily.      Marland Kitchen omeprazole (PRILOSEC) 20 MG capsule TAKE ONE CAPSULE BY MOUTH ONCE DAILY IN THE MORNING 30  MINUTES  BEFORE  BREAKFAST  30 capsule  11   No current facility-administered medications for this visit.   Review of patient's allergies indicates no known allergies. Family History  Problem Relation Age of Onset  . Heart disease Father     valvular disease  . COPD Father   . Prostate cancer Father    Social History:   reports that he has never smoked. He quit smokeless tobacco use about 2 years ago. He reports that he drinks about 7.2 ounces of alcohol per week. He reports that he does not use illicit drugs.   REVIEW OF SYSTEMS - PERTINENT POSITIVES ONLY: Positive for hearing loss, visual disturbance, and rashes. He said no prior surgery except for tonsillectomy. His risk factors include diabetes and hypertension as well as  obstructive sleep apnea. Otherwise his review of systems is negative  Physical Exam:   Blood pressure 136/88, pulse 82, temperature 97.6 F (36.4 C), resp. rate 18, height 6\' 1"  (1.854 m), weight 310 lb (140.615 kg). Body mass index is 40.91 kg/(m^2).  Gen:  WDWN WM NAD  Neurological: Alert and oriented to person, place, and time. Motor and sensory function is grossly intact  Head: Normocephalic and atraumatic.  Eyes: Conjunctivae are normal. Pupils are equal, round, and reactive to light. No scleral icterus.  Neck: Normal range of motion. Neck supple. No tracheal deviation or thyromegaly present.  Cardiovascular:  SR without murmurs or gallops.  No carotid bruits Respiratory: Effort normal.  No respiratory distress. No chest wall tenderness. Breath sounds normal.  No wheezes, rales or rhonchi.  Abdomen:  No prior abdominal surgery GU: Musculoskeletal:  Normal range of motion. Extremities are nontender. No cyanosis, edema or clubbing noted Lymphadenopathy: No cervical, preauricular, postauricular or axillary adenopathy is present Skin: Skin is warm and dry. No rash noted. No diaphoresis. No erythema. No pallor. Pscyh: Normal mood and affect. Behavior is normal. Judgment and thought content normal.   LABORATORY RESULTS: No results found for this or any previous visit (from the past 48 hour(s)).  RADIOLOGY RESULTS: No results found.  Problem List: Patient Active Problem List   Diagnosis Date Noted  . Trigeminal neuralgia 02/10/2012  . ERECTILE DYSFUNCTION 02/07/2010  . Morbid obesity 05/03/2009  . RHINITIS 05/03/2009  . DEGENERATIVE JOINT DISEASE 05/03/2009  . LOW BACK PAIN, CHRONIC 05/03/2009  . DIABETES MELLITUS, UNCONTROLLED 02/17/2009  . HYPERLIPIDEMIA 02/17/2009  . OBSTRUCTIVE SLEEP APNEA 08/23/2007  . OTHER MALAISE AND FATIGUE 08/06/2007  . NONSPECIFIC ABNORMAL ELECTROCARDIOGRAM 08/06/2007  . HEADACHE, CHRONIC 07/19/2006  . HYPERTENSION 05/22/2006    Assessment & Plan: Morbid obesity with DM and hypertension.  OSA treated with CPAP Plan:  Workup toward Roux en Y gastric bypass    Matt B. Daphine Deutscher, MD, [facility] [phone] beeper [phone]  12/12/2012 10:43 AM

## 2013-01-08 ENCOUNTER — Ambulatory Visit (HOSPITAL_COMMUNITY)
Admission: RE | Admit: 2013-01-08 | Discharge: 2013-01-08 | Disposition: A | Discharge status: Home / Self Care | Payer: BC Managed Care – PPO | Source: Ambulatory Visit | Attending: Surgery | Admitting: Surgery

## 2013-01-08 ENCOUNTER — Encounter (HOSPITAL_COMMUNITY)
Admission: RE | Disposition: A | Discharge status: Home / Self Care | Payer: BC Managed Care – PPO | Source: Ambulatory Visit | Attending: Surgery

## 2013-01-08 HISTORY — PX: BREATH TEK H PYLORI: SHX5422

## 2013-01-08 SURGERY — BREATH TEST, FOR HELICOBACTER PYLORI

## 2013-01-09 ENCOUNTER — Encounter (HOSPITAL_COMMUNITY): Payer: Self-pay | Admitting: Surgery

## 2013-01-22 ENCOUNTER — Ambulatory Visit (HOSPITAL_COMMUNITY)
Admission: RE | Admit: 2013-01-22 | Discharge: 2013-01-22 | Disposition: A | Discharge status: Home / Self Care | Payer: BC Managed Care – PPO | Source: Ambulatory Visit | Attending: Surgery | Admitting: Surgery

## 2013-01-22 ENCOUNTER — Encounter: Payer: Self-pay | Admitting: Dietician

## 2013-01-22 ENCOUNTER — Other Ambulatory Visit: Payer: Self-pay

## 2013-01-22 ENCOUNTER — Encounter: Payer: BC Managed Care – PPO | Attending: Surgery | Admitting: Dietician

## 2013-01-22 DIAGNOSIS — K219 Gastro-esophageal reflux disease without esophagitis: Secondary | ICD-10-CM | POA: Insufficient documentation

## 2013-01-22 DIAGNOSIS — G4733 Obstructive sleep apnea (adult) (pediatric): Secondary | ICD-10-CM | POA: Insufficient documentation

## 2013-01-22 DIAGNOSIS — Z6841 Body Mass Index (BMI) 40.0 and over, adult: Secondary | ICD-10-CM | POA: Insufficient documentation

## 2013-01-22 DIAGNOSIS — I10 Essential (primary) hypertension: Secondary | ICD-10-CM | POA: Insufficient documentation

## 2013-01-22 DIAGNOSIS — E119 Type 2 diabetes mellitus without complications: Secondary | ICD-10-CM | POA: Insufficient documentation

## 2013-01-22 DIAGNOSIS — K224 Dyskinesia of esophagus: Secondary | ICD-10-CM | POA: Insufficient documentation

## 2013-01-22 DIAGNOSIS — E785 Hyperlipidemia, unspecified: Secondary | ICD-10-CM | POA: Insufficient documentation

## 2013-01-22 DIAGNOSIS — M1990 Unspecified osteoarthritis, unspecified site: Secondary | ICD-10-CM | POA: Insufficient documentation

## 2013-01-22 DIAGNOSIS — Z713 Dietary counseling and surveillance: Secondary | ICD-10-CM | POA: Insufficient documentation

## 2013-01-22 NOTE — Progress Notes (Signed)
  Pre-Op Assessment Visit:  Pre-Operative RYGB Surgery  Medical Nutrition Therapy:  Appt start time: 3557   End time:  1230.  Patient was seen on 01/22/2013 for Pre-Operative RYGB  Nutrition Assessment. Assessment and letter of approval faxed to [facility] Bariatric Surgery Program coordinator on 01/22/2013.   Handouts given during visit include:  Pre-Op Goals Bariatric Surgery Protein Shakes  Patient to call the [facility] to enroll in Pre-Op and Post-Op Nutrition Education when surgery date is scheduled.

## 2013-01-22 NOTE — Patient Instructions (Signed)
Patient to call [facility] to schedule Pre-Op Class.

## 2013-02-19 ENCOUNTER — Telehealth: Payer: Self-pay | Admitting: Pulmonary Disease

## 2013-02-19 NOTE — Telephone Encounter (Signed)
LMOM x 1 to R/C Does not look like patient has been seen 09/16/2009 Will need a Consult appt for Sleep with [facility]

## 2013-02-20 NOTE — Telephone Encounter (Signed)
lmomtcb x 2  

## 2013-02-21 NOTE — Telephone Encounter (Signed)
Pt is already scheduled for 03/11/13 at 4:00pm. Advised him that we will not be able to order a new machine until he is seen. He agreed and verbalized understanding.

## 2013-02-27 ENCOUNTER — Other Ambulatory Visit (INDEPENDENT_AMBULATORY_CARE_PROVIDER_SITE_OTHER): Payer: Self-pay | Admitting: Surgery

## 2013-03-11 ENCOUNTER — Encounter: Payer: Self-pay | Admitting: Pulmonary Disease

## 2013-03-11 ENCOUNTER — Ambulatory Visit (INDEPENDENT_AMBULATORY_CARE_PROVIDER_SITE_OTHER): Payer: BC Managed Care – PPO | Admitting: Pulmonary Disease

## 2013-03-11 ENCOUNTER — Ambulatory Visit: Payer: BC Managed Care – PPO | Admitting: Pulmonary Disease

## 2013-03-11 VITALS — BP 118/72 | HR 83 | Temp 97.9°F | Ht 73.0 in | Wt 315.2 lb

## 2013-03-11 DIAGNOSIS — G4733 Obstructive sleep apnea (adult) (pediatric): Secondary | ICD-10-CM

## 2013-03-11 NOTE — Patient Instructions (Signed)
Will order you a new cpap machine, and set on auto for your upcoming bariatric surgery. Keep up with mask changes and supplies. followup with me again in 25mos.

## 2013-03-11 NOTE — Assessment & Plan Note (Signed)
The patient has a history of mild obstructive sleep apnea, but has done extremely well on CPAP over the years. He has not kept up followup visits, but comes in today to reestablish prior to his bariatric surgery next month. He is requesting a new CPAP machine, and his current machine is almost 52 years old. We'll send the order to his insurance company to see if they will cover. He also needs new CPAP supplies and nasal pillows, and I've encouraged him to keep up with his mask changes, filters, and hoses. I will set his new device on the automatic setting since he is likely to lose weight rapidly after his surgery.

## 2013-03-11 NOTE — Progress Notes (Signed)
Subjective:    Patient ID: Ethan Pitts, male    DOB: 1960-04-18, 53 y.o.   MRN: [id number]  HPI The patient is a 53 year old male who I've been asked to see for management of obstructive sleep apnea. He was diagnosed with mild OSA in 2009, with an RDI of 18 events per hour. The patient was started on CPAP and did quite well, but has not been seen since 2011. He is wearing his CPAP compliantly, but feels that he needs a new CPAP device. He is using nasal pillows, but has not replaced these in quite some time.  He also has not changed his hoses or filters in quite some time.  The patient is trying to reestablish for management of sleep apnea, prior to having his bariatric surgery next month. He feels that he is rested in the mornings upon arising, and has not had breakthrough snoring. He feels adequately rested during the day, but does intermittently have some sleep pressure. His weight is down 5 pounds from 2011, and his Epworth score today is 13.   Sleep Questionnaire What time do you typically go to bed?( Between what hours) 12am 12am at 1556 on 03/11/13 by Virl Cagey, CMA How long does it take you to fall asleep? 1mins 23mins at 1556 on 03/11/13 by Virl Cagey, CMA How many times during the night do you wake up? 3 3 at 1556 on 03/11/13 by Virl Cagey, CMA What time do you get out of bed to start your day? 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 YesYes EMT at 1556 on 03/11/13 by Virl Cagey, CMA How much has your weight changed (up or down) over the past two years? (In pounds) 0 oz (0 kg) 0 oz (0 kg) at 1556 on 03/11/13 by Virl Cagey, CMA Have you ever had a sleep study before? Yes Yes at 1556 on 03/11/13 by Virl Cagey, CMA If yes, location of study? [place] at 1556 on 03/11/13 by Virl Cagey, CMA If yes, date of study? 2009 2009 at 1556 on  03/11/13 by Virl Cagey, CMA Do you currently use CPAP? Yes Yes at 1556 on 03/11/13 by Virl Cagey, CMA If so, what pressure? unsure unsure at 1556 on 03/11/13 by Virl Cagey, CMA Do you wear oxygen at any time? No No at 1556 on 03/11/13 by Virl Cagey, CMA   Review of Systems  Constitutional: Negative for fever and unexpected weight change.  HENT: Negative for congestion, dental problem, ear pain, nosebleeds, postnasal drip, rhinorrhea, sinus pressure, sneezing, sore throat and trouble swallowing.   Eyes: Negative for redness and itching.  Respiratory: Positive for shortness of breath. Negative for cough, chest tightness and wheezing.   Cardiovascular: Negative for palpitations and leg swelling.  Gastrointestinal: Negative for nausea and vomiting.  Genitourinary: Negative for dysuria.  Musculoskeletal: Negative for joint swelling.  Skin: Positive for rash.  Neurological: Negative for headaches.  Hematological: Does not bruise/bleed easily.  Psychiatric/Behavioral: Positive for dysphoric mood. The patient is not nervous/anxious.        Objective:   Physical Exam Constitutional:  Obese male, no acute distress  HENT:  Nares patent without discharge  Oropharynx without exudate, palate and uvula are mildly elongated  Eyes:  Perrla, eomi, no scleral icterus  Neck:  No JVD, no TMG  Cardiovascular:  Normal rate, regular rhythm, no rubs or gallops.  No murmurs        Intact distal pulses  Pulmonary :  Normal breath sounds, no stridor or respiratory distress   No rales, rhonchi, or wheezing  Abdominal:  Soft, nondistended, bowel sounds present.  No tenderness noted.   Musculoskeletal:  No lower extremity edema noted.  Lymph Nodes:  No cervical lymphadenopathy noted  Skin:  No cyanosis noted  Neurologic:  Alert, appropriate, moves all 4 extremities without obvious deficit.         Assessment & Plan:

## 2013-03-13 ENCOUNTER — Telehealth: Payer: Self-pay | Admitting: Pulmonary Disease

## 2013-03-13 ENCOUNTER — Encounter: Payer: Self-pay | Admitting: Internal Medicine

## 2013-03-13 NOTE — Telephone Encounter (Signed)
Spoke w/ Melissa from [facility]. Pt originally received CPAP from them 10/2007-at that time he had [employer]. Pt sleep study showed AHI 4.4 and RDI 17.8 per melissa. [employer] accepted the RDI and reason the approved CPAP. Pt now has changed insurances to [employer]. They looks at AHI and no RDI. They are advised [facility] pt will need a new sleep study bc according to [facility] he does not qualify for CPAP. Please advise [place] thanks

## 2013-03-14 NOTE — Telephone Encounter (Signed)
Tried to call pt and LMOM  Let him know that his current insurance company is not accepting his old sleep study in order to get him a new cpap machine.  In 2009, most insurance companies accepted both complete and partial apneas, and now the new standard is primarily complete apneas.  You did not have enough complete apneas on your 2009 study to qualify for cpap in 2015.  He needs to have a repeat sleep study, and would be willing to do home sleep test if he is ok with this. Marland Kitchen

## 2013-03-15 ENCOUNTER — Other Ambulatory Visit: Payer: Self-pay | Admitting: Internal Medicine

## 2013-03-15 DIAGNOSIS — B354 Tinea corporis: Secondary | ICD-10-CM

## 2013-03-17 ENCOUNTER — Other Ambulatory Visit: Payer: Self-pay | Admitting: Pulmonary Disease

## 2013-03-17 DIAGNOSIS — G4733 Obstructive sleep apnea (adult) (pediatric): Secondary | ICD-10-CM

## 2013-03-17 NOTE — Telephone Encounter (Signed)
Spoke with patient Okay with repeating sleep study. Pt wanted to know if it is possible to have the study at home rather than in the facility. (less costly to have at home) Pt states that if not able to HOME sleep test then we can order the facility test instead.  Please advise. Thanks.

## 2013-03-17 NOTE — Telephone Encounter (Signed)
Order for home sleep test sent.

## 2013-03-20 ENCOUNTER — Encounter: Payer: Self-pay | Admitting: Endocrinology

## 2013-03-20 ENCOUNTER — Encounter: Payer: BC Managed Care – PPO | Attending: Surgery

## 2013-03-20 DIAGNOSIS — Z713 Dietary counseling and surveillance: Secondary | ICD-10-CM | POA: Insufficient documentation

## 2013-03-20 NOTE — Progress Notes (Signed)
  Pre-Operative Nutrition Class:  Appt start time: 830   End time:  1030.  Patient was seen on 03/20/2013 for Pre-Operative Bariatric Surgery Education [facility].   Surgery date: 04/14/2013 Surgery type: RYGB Start weight at [facility]: 312 on 01/22/2013 Weight today: 313.5 lbs  TANITA  BODY COMP RESULTS  03/20/2013   BMI (kg/m^2) 41.4   Fat Mass (lbs) 141   Fat Free Mass (lbs) 172.5   Total Body Water (lbs) 126.5   Samples given per MNT protocol. Patient educated on appropriate usage: Celebrate Multivitamin (grape)  Lot #: [id number]  Exp: 04/2014   Bariactiv Calcium Citrate (berry)  Lot #: [id number]  Exp: 05/2014   Premier protein shake (vanilla)  Lot #: [id number]  Exp: 10/2013   Renee Pain Protein Powder (chocolate)  Lot #: [id number]  Exp: 04/2014   The following the learning objectives were met by the patient during this course:  Identify Pre-Op Dietary Goals and will begin 2 weeks pre-operatively  Identify appropriate sources of fluids and proteins   State protein recommendations and appropriate sources pre and post-operatively  Identify Post-Operative Dietary Goals and will follow for 2 weeks post-operatively  Identify appropriate multivitamin and calcium sources  Describe the need for physical activity post-operatively and will follow MD recommendations  State when to call healthcare provider regarding medication questions or post-operative complications  Handouts given during class include:  Pre-Op Bariatric Surgery Diet Handout  Protein Shake Handout  Post-Op Bariatric Surgery Nutrition Handout  BELT Program Information Flyer  Support Group Information Flyer  [facility] Bariatric Supplements Price List  Follow-Up Plan: Patient will follow-up at [facility] 2 weeks post operatively for diet advancement per MD.

## 2013-03-21 ENCOUNTER — Encounter: Payer: Self-pay | Admitting: Endocrinology

## 2013-03-24 ENCOUNTER — Encounter: Payer: Self-pay | Admitting: Pulmonary Disease

## 2013-03-24 ENCOUNTER — Telehealth: Payer: Self-pay | Admitting: Pulmonary Disease

## 2013-03-24 ENCOUNTER — Encounter: Payer: Self-pay | Admitting: Endocrinology

## 2013-03-24 ENCOUNTER — Ambulatory Visit (INDEPENDENT_AMBULATORY_CARE_PROVIDER_SITE_OTHER): Payer: BC Managed Care – PPO | Admitting: Endocrinology

## 2013-03-24 ENCOUNTER — Other Ambulatory Visit: Payer: Self-pay | Admitting: Pulmonary Disease

## 2013-03-24 VITALS — BP 132/84 | HR 80 | Temp 98.2°F | Ht 73.0 in | Wt 309.0 lb

## 2013-03-24 DIAGNOSIS — IMO0001 Reserved for inherently not codable concepts without codable children: Secondary | ICD-10-CM

## 2013-03-24 DIAGNOSIS — E1165 Type 2 diabetes mellitus with hyperglycemia: Principal | ICD-10-CM

## 2013-03-24 DIAGNOSIS — G4733 Obstructive sleep apnea (adult) (pediatric): Secondary | ICD-10-CM

## 2013-03-24 MED ORDER — TRIAMCINOLONE ACETONIDE 0.1 % EX CREA: 1.0000 "application " | TOPICAL_CREAM | Freq: Three times a day (TID) | CUTANEOUS | Status: DC

## 2013-03-24 NOTE — Patient Instructions (Addendum)
check your blood sugar once a day.  vary the time of day when you check, between before the 3 meals, and at bedtime.  also check if you have symptoms of your blood sugar being too high or too low.  please keep a record of the readings and bring it to your next appointment here.  You can write it on any piece of paper.  please call us sooner if your blood sugar goes below 70, or if you have a lot of readings over 200.   Please continue the same medications for now. i have sent a prescription to your pharmacy, for the itching.   Please come back for a follow-up appointment in 1 month (this will be after you get out of the hospital).

## 2013-03-24 NOTE — Progress Notes (Signed)
Subjective:    Patient ID: Ethan Pitts, male    DOB: 01-28-1960, 53 y.o.   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  Pt will have gastric bypass surgery in 3 weeks.  no cbg record, but states cbg's are in the 200's.  He takes meds intermittently.  He says he is about to go pre-surgery diet.   Pt states few weeks of moderate itching throughout the body, and slight assoc rash.   Past Medical History  Diagnosis Date  . Hyperlipidemia   . Hypertension   . Diabetes mellitus   . GERD (gastroesophageal reflux disease)     Past Surgical History  Procedure Laterality Date  . Tonsillectomy    . Adenoidectomy    . Breath tek h pylori N/A 01/08/2013    Procedure: BREATH TEK H PYLORI;  Surgeon: [name], MD;  Location: [facility];  Service: General;  Laterality: N/A;    History   Social History  . Marital Status: Single    Spouse Name: N/A    Number of Children: N/A  . Years of Education: N/A   Occupational History  . 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 Smoking status: Never Smoker   . Smokeless tobacco: Former Systems developer    Quit date: 02/06/2010  . Alcohol Use: 7.2 oz/week    12 Cans of beer per week     Comment:  12pk/per week  . Drug Use: No  . Sexual Activity: Not on file   Other Topics Concern  . Not on file   Social History Narrative  . No narrative on file    Current Outpatient Prescriptions on File Prior to Visit  Medication Sig Dispense Refill  . ALPRAZolam (XANAX) 0.5 MG tablet Take 1 tablet (0.5 mg total) by mouth every 8 (eight) hours as needed for sleep or anxiety.  30 tablet  0  . amLODipine (NORVASC) 5 MG tablet 1 by mouth daily  90 tablet  1  . aspirin 81 MG tablet Take 81 mg by mouth daily.        . benazepril (LOTENSIN) 40 MG tablet  TAKE ONE TABLET BY MOUTH EVERY DAY  90 tablet  1  . Canagliflozin (INVOKANA) 300 MG TABS Take 1 tablet (300 mg total) by mouth daily.  30 tablet  11  . carvedilol (COREG) 25 MG tablet TAKE ONE TABLET BY MOUTH TWICE DAILY WITH A MEAL  180 tablet  1  . Insulin Pen Needle 31G X 5 MM MISC Use with Victoza as directed  30 each  5  . Liraglutide (VICTOZA) 18 MG/3ML SOPN Inject 1.8 mg into the skin 1 day or 1 dose.  3 mL  3  . metFORMIN (GLUCOPHAGE-XR) 500 MG 24 hr tablet Take 2 tablets (1,000 mg total) by mouth 2 (two) times daily.  120 tablet  11  . nateglinide (STARLIX) 120 MG tablet Take 1 tablet (120 mg total) by mouth 3 (three) times daily before meals.  90 tablet  11  . Omega-3 Fatty Acids (FISH OIL) 500 MG CAPS Take 1 capsule by mouth 2 (two) times daily.      Marland Kitchen omeprazole (PRILOSEC) 20 MG capsule TAKE ONE CAPSULE BY MOUTH ONCE DAILY IN THE MORNING 30  MINUTES  BEFORE  BREAKFAST  30 capsule  11   No current facility-administered medications on file prior to visit.    No Known Allergies  Family History  Problem Relation Age of Onset  . Heart disease Father     valvular disease  . COPD Father   . Prostate cancer Father     BP 132/84  Pulse 80  Temp(Src) 98.2 F (36.8 C) (Oral)  Ht 6\' 1"  (1.854 m)  Wt 309 lb (140.161 kg)  BMI 40.78 kg/m2  SpO2 93%  Review of Systems He denies hypoglycemia and weight change.      Objective:   Physical Exam VITAL SIGNS:  See vs page GENERAL: no distress Skin: dry skin, but no rash.       Assessment & Plan:  Noncompliance with medication: this limits the rx of DM Morbid obesity: he will have surgery for this soon. Itching: very unlikely related to medications.

## 2013-03-24 NOTE — Telephone Encounter (Signed)
Will send order for formal NPSG.

## 2013-03-24 NOTE — Telephone Encounter (Signed)
[employer] denied HST.  Called and spoke with patient and he is willing to proceed with in lab study. Please send order to [facility]

## 2013-04-01 ENCOUNTER — Encounter (HOSPITAL_COMMUNITY): Payer: Self-pay | Admitting: Pharmacy Technician

## 2013-04-03 ENCOUNTER — Telehealth (INDEPENDENT_AMBULATORY_CARE_PROVIDER_SITE_OTHER): Payer: Self-pay

## 2013-04-03 ENCOUNTER — Ambulatory Visit (INDEPENDENT_AMBULATORY_CARE_PROVIDER_SITE_OTHER): Payer: BC Managed Care – PPO | Admitting: Surgery

## 2013-04-03 ENCOUNTER — Ambulatory Visit (HOSPITAL_BASED_OUTPATIENT_CLINIC_OR_DEPARTMENT_OTHER): Payer: BC Managed Care – PPO | Attending: Pulmonary Disease | Admitting: Radiology

## 2013-04-03 ENCOUNTER — Encounter (INDEPENDENT_AMBULATORY_CARE_PROVIDER_SITE_OTHER): Payer: Self-pay | Admitting: Surgery

## 2013-04-03 VITALS — Ht 73.0 in | Wt 309.0 lb

## 2013-04-03 DIAGNOSIS — G4733 Obstructive sleep apnea (adult) (pediatric): Secondary | ICD-10-CM | POA: Insufficient documentation

## 2013-04-03 DIAGNOSIS — Z9989 Dependence on other enabling machines and devices: Secondary | ICD-10-CM

## 2013-04-03 MED ORDER — HYDROCODONE-ACETAMINOPHEN 7.5-325 MG/15ML PO SOLN: 10.0000 mL | Freq: Four times a day (QID) | ORAL | Status: DC | PRN

## 2013-04-03 NOTE — Telephone Encounter (Signed)
Called and spoke to patient regarding pre-op bowel prep.  RX and instructions mailed to patient's home address as noted in EPIC.

## 2013-04-03 NOTE — Progress Notes (Signed)
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 He has been followed by Dr. Ignacia Palma and has done quite a bit of research on bariatric surgery and desires to have a Roux-en-Y gastric bypass. Dr. Linna Darner has endorse this and he has been to one of our seminars. A 1 over Roux-en-Y gastric bypass with him using the props and he has a good understanding of the operation. He also indicated that he had watched U tube videos and was pretty convinced that he wanted to have a gastric bypass. He has been to our seminar and also has been to a seminar with his fiance.  His upper GI showed no hiatus hernia and his ultrasound showed no gallstones.  He is ready for surgery.    He has had DM for 4 years.    Past Medical History  Diagnosis Date  . Hyperlipidemia   . Hypertension   . Diabetes mellitus   . GERD (gastroesophageal reflux disease)     Past Surgical History  Procedure Laterality Date  . Tonsillectomy    . Adenoidectomy    . Breath tek h pylori N/A 01/08/2013    Procedure: BREATH TEK H PYLORI;  Surgeon: [name], MD;  Location: [facility];  Service: General;  Laterality: N/A;    Current Outpatient Prescriptions  Medication Sig Dispense Refill  . ALPRAZolam (XANAX) 0.5 MG tablet Take 1 tablet (0.5 mg total) by mouth every 8 (eight) hours as needed for sleep or anxiety.  30 tablet  0  . amLODipine (NORVASC) 5 MG tablet Take 5 mg by mouth every morning.      Marland Kitchen aspirin EC 81 MG tablet Take 81 mg by mouth daily.      . benazepril (LOTENSIN) 40 MG tablet Take 40 mg by mouth every morning.      . Canagliflozin (INVOKANA) 300 MG TABS Take 1 tablet (300 mg total) by mouth daily.  30 tablet  11  . carvedilol (COREG) 25 MG tablet Take 25 mg by mouth 2 (two) times daily with a meal.      . Insulin Pen Needle 31G X 5 MM MISC Use with  Victoza as directed  30 each  5  . Liraglutide (VICTOZA) 18 MG/3ML SOPN Inject 1.8 mg into the skin 1 day or 1 dose.  3 mL  3  . metFORMIN (GLUCOPHAGE-XR) 500 MG 24 hr tablet Take 500 mg by mouth 2 (two) times daily.      . naproxen sodium (ALEVE) 220 MG tablet Take 220-440 mg by mouth 2 (two) times daily as needed (Pain).      . nateglinide (STARLIX) 120 MG tablet Take 1 tablet (120 mg total) by mouth 3 (three) times daily before meals.  90 tablet  11  . Omega-3 Fatty Acids (FISH OIL) 500 MG CAPS Take 1 capsule by mouth 2 (two) times daily.      Marland Kitchen omeprazole (PRILOSEC) 20 MG capsule Take 20 mg by mouth at bedtime.      . triamcinolone cream (KENALOG) 0.1 % Apply 1 application topically 3 (three) times daily. As needed for itching  45 g  2  . HYDROcodone-acetaminophen (HYCET) 7.5-325 mg/15 ml solution Take 10 mLs by mouth 4 (four) times daily as needed for moderate pain.  300 mL  0   No current facility-administered medications for this visit.  Review of patient's allergies indicates no known allergies. Family History  Problem Relation Age of Onset  . Heart disease Father     valvular disease  . COPD Father   . Prostate cancer Father    Social History:   reports that he has never smoked. He quit smokeless tobacco use about 3 years ago. He reports that he drinks about 7.2 ounces of alcohol per week. He reports that he does not use illicit drugs.   REVIEW OF SYSTEMS - PERTINENT POSITIVES ONLY: Positive for hearing loss, visual disturbance, and rashes. He said no prior surgery except for tonsillectomy. His risk factors include diabetes and hypertension as well as obstructive sleep apnea. Otherwise his review of systems is negative  Physical Exam:   Blood pressure 140/86, pulse 78, resp. rate 18, height 6\' 1"  (1.854 m), weight 308 lb (139.708 kg). Body mass index is 40.64 kg/(m^2).  Gen:  WDWN WM NAD  Neurological: Alert and oriented to person, place, and time. Motor and sensory function  is grossly intact  Head: Normocephalic and atraumatic.  Eyes: Conjunctivae are normal. Pupils are equal, round, and reactive to light. No scleral icterus.  Neck: Normal range of motion. Neck supple. No tracheal deviation or thyromegaly present.  Cardiovascular:  SR without murmurs or gallops.  No carotid bruits Respiratory: Effort normal.  No respiratory distress. No chest wall tenderness. Breath sounds normal.  No wheezes, rales or rhonchi.  Abdomen:  No prior abdominal surgery GU: Musculoskeletal: Normal range of motion. Extremities are nontender. No cyanosis, edema or clubbing noted Lymphadenopathy: No cervical, preauricular, postauricular or axillary adenopathy is present Skin: Skin is warm and dry. No rash noted. No diaphoresis. No erythema. No pallor. Pscyh: Normal mood and affect. Behavior is normal. Judgment and thought content normal.   LABORATORY RESULTS: No results found for this or any previous visit (from the past 48 hour(s)).  RADIOLOGY RESULTS: No results found.  Problem List: Patient Active Problem List   Diagnosis Date Noted  . Trigeminal neuralgia 02/10/2012  . ERECTILE DYSFUNCTION 02/07/2010  . Morbid obesity 05/03/2009  . RHINITIS 05/03/2009  . DEGENERATIVE JOINT DISEASE 05/03/2009  . LOW BACK PAIN, CHRONIC 05/03/2009  . DIABETES MELLITUS, UNCONTROLLED 02/17/2009  . HYPERLIPIDEMIA 02/17/2009  . OBSTRUCTIVE SLEEP APNEA 08/23/2007  . OTHER MALAISE AND FATIGUE 08/06/2007  . NONSPECIFIC ABNORMAL ELECTROCARDIOGRAM 08/06/2007  . HEADACHE, CHRONIC 07/19/2006  . HYPERTENSION 05/22/2006    Assessment & Plan: Morbid obesity with DM and hypertension.  OSA treated with CPAP For Roux Y gastric bypass on Monday, April 13.    Matt B. Hassell Done, MD, [facility] [phone] beeper [phone]  04/03/2013 11:51 AM

## 2013-04-03 NOTE — Patient Instructions (Addendum)
NIC LAMPE  04/03/2013   Your procedure is scheduled on:  04/14/13  Report to [place] at    [place]  AM.  Call this number if you have problems the morning of surgery: [phone]   Remember:   Do not eat food or drink liquids after midnight.   Take these medicines the morning of surgery with A SIP OF WATER:    Do not wear jewelry  Do not wear lotions, powders, or perfumes.    Men may shave face and neck.  Do not bring valuables to the hospital.  Contacts, dentures or bridgework may not be worn into surgery.  Leave suitcase in the car. After surgery it may be brought to your room.  For patients admitted to the hospital, checkout time is 11:00 AM the day of  discharge.      SEE CHG INSTRUCTION SHEET    Incentive Spirometer  An incentive spirometer is a tool that can help keep your lungs clear and active. This tool measures how well you are filling your lungs with each breath. Taking long deep breaths may help reverse or decrease the chance of developing breathing (pulmonary) problems (especially infection) following:  A long period of time when you are unable to move or be active. BEFORE THE PROCEDURE   If the spirometer includes an indicator to show your best effort, your nurse or respiratory therapist will set it to a desired goal.  If possible, sit up straight or lean slightly forward. Try not to slouch.  Hold the incentive spirometer in an upright position. INSTRUCTIONS FOR USE  1. Sit on the edge of your bed if possible, or sit up as far as you can in bed or on a chair. 2. Hold the incentive spirometer in an upright position. 3. Breathe out normally. 4. Place the mouthpiece in your mouth and seal your lips tightly around it. 5. Breathe in slowly and as deeply as possible, raising the piston or the ball toward the top of the column. 6. Hold your breath for 3-5 seconds or for as long as possible. Allow the piston or ball to fall to the bottom of  the column. 7. Remove the mouthpiece from your mouth and breathe out normally. 8. Rest for a few seconds and repeat Steps 1 through 7 at least 10 times every 1-2 hours when you are awake. Take your time and take a few normal breaths between deep breaths. 9. The spirometer may include an indicator to show your best effort. Use the indicator as a goal to work toward during each repetition. 10. After each set of 10 deep breaths, practice coughing to be sure your lungs are clear. If you have an incision (the cut made at the time of surgery), support your incision when coughing by placing a pillow or rolled up towels firmly against it. Once you are able to get out of bed, walk around indoors and cough well. You may stop using the incentive spirometer when instructed by your caregiver.  RISKS AND COMPLICATIONS  Take your time so you do not get dizzy or light-headed.  If you are in pain, you may need to take or ask for pain medication before doing incentive spirometry. It is harder to take a deep breath if you are having pain. AFTER USE  Rest and breathe slowly and easily.  It can be helpful to keep track of a log of your progress. Your caregiver can provide you with a simple table  to help with this. If you are using the spirometer at home, follow these instructions: [place] IF:   You are having difficultly using the spirometer.  You have trouble using the spirometer as often as instructed.  Your pain medication is not giving enough relief while using the spirometer.  You develop fever of 100.5 F (38.1 C) or higher. SEEK IMMEDIATE MEDICAL CARE IF:   You cough up bloody sputum that had not been present before.  You develop fever of 102 F (38.9 C) or greater.  You develop worsening pain at or near the incision site. MAKE SURE YOU:   Understand these instructions.  Will watch your condition.  Will get help right away if you are not doing well or get worse. Document  Released: 05/01/2006 Document Revised: 03/13/2011 Document Reviewed: 07/02/2006 [facility] Patient Information 2014 [place], [place].                [place] - Preparing for Surgery Before surgery, you can play an important role.  Because skin is not sterile, your skin needs to be as free of germs as possible.  You can reduce the number of germs on your skin by washing with CHG (chlorahexidine gluconate) soap before surgery.  CHG is an antiseptic cleaner which kills germs and bonds with the skin to continue killing germs even after washing. Please DO NOT use if you have an allergy to CHG or antibacterial soaps.  If your skin becomes reddened/irritated stop using the CHG and inform your nurse when you arrive at Short Stay. Do not shave (including legs and underarms) for at least 48 hours prior to the first CHG shower.  You may shave your face. Please follow these instructions carefully:  1.  Shower with CHG Soap the night before surgery and the  morning of Surgery.  2.  If you choose to wash your hair, wash your hair first as usual with your  normal  shampoo.  3.  After you shampoo, rinse your hair and body thoroughly to remove the  shampoo.                           4.  Use CHG as you would any other liquid soap.  You can apply chg directly  to the skin and wash                       Gently with a scrungie or clean washcloth.  5.  Apply the CHG Soap to your body ONLY FROM THE NECK DOWN.   Do not use on open                           Wound or open sores. Avoid contact with eyes, ears mouth and genitals (private parts).                        Genitals (private parts) with your normal soap.             6.  Wash thoroughly, paying special attention to the area where your surgery  will be performed.  7.  Thoroughly rinse your body with warm water from the neck down.  8.  DO NOT shower/wash with your normal soap after using and rinsing off  the CHG Soap.  9.  Pat yourself dry with a  clean towel.            10.  Wear clean pajamas.            11.  Place clean sheets on your bed the night of your first shower and do not  sleep with pets. Day of Surgery : Do not apply any lotions/deodorants the morning of surgery.  Please wear clean clothes to the hospital/surgery center.  FAILURE TO FOLLOW THESE INSTRUCTIONS MAY RESULT IN THE CANCELLATION OF YOUR SURGERY PATIENT SIGNATURE_________________________________  NURSE SIGNATURE__________________________________

## 2013-04-03 NOTE — Patient Instructions (Signed)

## 2013-04-07 ENCOUNTER — Encounter (HOSPITAL_COMMUNITY)
Admission: RE | Admit: 2013-04-07 | Discharge: 2013-04-07 | Disposition: A | Discharge status: Home / Self Care | Payer: BC Managed Care – PPO | Source: Ambulatory Visit | Attending: Surgery | Admitting: Surgery

## 2013-04-07 ENCOUNTER — Encounter (HOSPITAL_COMMUNITY): Payer: Self-pay

## 2013-04-07 DIAGNOSIS — Z01812 Encounter for preprocedural laboratory examination: Secondary | ICD-10-CM | POA: Insufficient documentation

## 2013-04-07 HISTORY — DX: Unspecified osteoarthritis, unspecified site: M19.90

## 2013-04-07 HISTORY — DX: Anxiety disorder, unspecified: F41.9

## 2013-04-07 LAB — COMPREHENSIVE METABOLIC PANEL
ALK PHOS: 85 U/L (ref 39–117)
ALT: 72 U/L — AB (ref 0–53)
AST: 49 U/L — ABNORMAL HIGH (ref 0–37)
Albumin: 3.9 g/dL (ref 3.5–5.2)
BUN: 16 mg/dL (ref 6–23)
CALCIUM: 9.7 mg/dL (ref 8.4–10.5)
CO2: 25 mEq/L (ref 19–32)
Chloride: 101 mEq/L (ref 96–112)
Creatinine, Ser: 0.86 mg/dL (ref 0.50–1.35)
GFR calc Af Amer: >90 mL/min (ref >90)
GFR calc non Af Amer: >90 mL/min (ref >90)
Glucose, Bld: 132 mg/dL — ABNORMAL HIGH (ref 70–99)
POTASSIUM: 4.1 meq/L (ref 3.7–5.3)
SODIUM: 138 meq/L (ref 137–147)
Total Bilirubin: 0.4 mg/dL (ref 0.3–1.2)
Total Protein: 7.5 g/dL (ref 6.0–8.3)

## 2013-04-07 LAB — CBC WITH DIFFERENTIAL/PLATELET
BASOS ABS: 0 10*3/uL (ref 0.0–0.1)
Basophils Relative: 1 % (ref 0–1)
EOS ABS: 0.2 10*3/uL (ref 0.0–0.7)
EOS PCT: 3 % (ref 0–5)
HCT: 41.2 % (ref 39.0–52.0)
Hemoglobin: 13.3 g/dL (ref 13.0–17.0)
LYMPHS ABS: 1.8 10*3/uL (ref 0.7–4.0)
LYMPHS PCT: 29 % (ref 12–46)
MCH: 26.1 pg (ref 26.0–34.0)
MCHC: 32.3 g/dL (ref 30.0–36.0)
MCV: 80.8 fL (ref 78.0–100.0)
Monocytes Absolute: 0.6 10*3/uL (ref 0.1–1.0)
Monocytes Relative: 10 % (ref 3–12)
Neutro Abs: 3.6 10*3/uL (ref 1.7–7.7)
Neutrophils Relative %: 58 % (ref 43–77)
PLATELETS: 183 10*3/uL (ref 150–400)
RBC: 5.1 MIL/uL (ref 4.22–5.81)
RDW: 14.7 % (ref 11.5–15.5)
WBC: 6.2 10*3/uL (ref 4.0–10.5)

## 2013-04-07 NOTE — Progress Notes (Signed)
Sleep Study done 04/03/13.  Results in EPIC.  Last office visit with Dr Gwenette Greet 03/11/13 in [facility].   CXR and EKG 01/2013 in EPIC.

## 2013-04-07 NOTE — Progress Notes (Signed)
Patient aware of bowel prep prior to surgery at time of preop appointment.

## 2013-04-14 ENCOUNTER — Encounter (HOSPITAL_COMMUNITY): Payer: Self-pay | Admitting: *Deleted

## 2013-04-14 ENCOUNTER — Inpatient Hospital Stay (HOSPITAL_COMMUNITY)
Admission: RE | Admit: 2013-04-14 | Discharge: 2013-04-16 | DRG: 621 | Disposition: A | Discharge status: Home / Self Care | Payer: BC Managed Care – PPO | Source: Ambulatory Visit | Attending: Surgery | Admitting: Surgery

## 2013-04-14 ENCOUNTER — Inpatient Hospital Stay (HOSPITAL_COMMUNITY): Payer: BC Managed Care – PPO | Admitting: Anesthesiology

## 2013-04-14 ENCOUNTER — Telehealth: Payer: Self-pay | Admitting: Pulmonary Disease

## 2013-04-14 ENCOUNTER — Encounter (HOSPITAL_COMMUNITY)
Admission: RE | Disposition: A | Discharge status: Home / Self Care | Payer: Self-pay | Source: Ambulatory Visit | Attending: Surgery

## 2013-04-14 ENCOUNTER — Encounter (HOSPITAL_COMMUNITY): Payer: BC Managed Care – PPO | Admitting: Anesthesiology

## 2013-04-14 DIAGNOSIS — G4733 Obstructive sleep apnea (adult) (pediatric): Secondary | ICD-10-CM | POA: Diagnosis present

## 2013-04-14 DIAGNOSIS — I10 Essential (primary) hypertension: Secondary | ICD-10-CM

## 2013-04-14 DIAGNOSIS — Z8249 Family history of ischemic heart disease and other diseases of the circulatory system: Secondary | ICD-10-CM

## 2013-04-14 DIAGNOSIS — G4710 Hypersomnia, unspecified: Secondary | ICD-10-CM

## 2013-04-14 DIAGNOSIS — Z794 Long term (current) use of insulin: Secondary | ICD-10-CM

## 2013-04-14 DIAGNOSIS — Z9884 Bariatric surgery status: Secondary | ICD-10-CM

## 2013-04-14 DIAGNOSIS — Z6841 Body Mass Index (BMI) 40.0 and over, adult: Secondary | ICD-10-CM

## 2013-04-14 DIAGNOSIS — K219 Gastro-esophageal reflux disease without esophagitis: Secondary | ICD-10-CM | POA: Diagnosis present

## 2013-04-14 DIAGNOSIS — Z79899 Other long term (current) drug therapy: Secondary | ICD-10-CM

## 2013-04-14 DIAGNOSIS — Z8042 Family history of malignant neoplasm of prostate: Secondary | ICD-10-CM

## 2013-04-14 DIAGNOSIS — E119 Type 2 diabetes mellitus without complications: Secondary | ICD-10-CM | POA: Diagnosis present

## 2013-04-14 DIAGNOSIS — E785 Hyperlipidemia, unspecified: Secondary | ICD-10-CM | POA: Diagnosis present

## 2013-04-14 DIAGNOSIS — G4730 Sleep apnea, unspecified: Secondary | ICD-10-CM

## 2013-04-14 HISTORY — PX: GASTRIC ROUX-EN-Y: SHX5262

## 2013-04-14 LAB — CREATININE, SERUM
CREATININE: 1 mg/dL (ref 0.50–1.35)
GFR calc Af Amer: >90 mL/min (ref >90)
GFR calc non Af Amer: 85 mL/min — ABNORMAL LOW (ref >90)

## 2013-04-14 LAB — CBC
HCT: 40.7 % (ref 39.0–52.0)
Hemoglobin: 13.3 g/dL (ref 13.0–17.0)
MCH: 26.1 pg (ref 26.0–34.0)
MCHC: 32.7 g/dL (ref 30.0–36.0)
MCV: 80 fL (ref 78.0–100.0)
Platelets: 174 10*3/uL (ref 150–400)
RBC: 5.09 MIL/uL (ref 4.22–5.81)
RDW: 14.7 % (ref 11.5–15.5)
WBC: 9.1 10*3/uL (ref 4.0–10.5)

## 2013-04-14 LAB — GLUCOSE, CAPILLARY
GLUCOSE-CAPILLARY: 123 mg/dL — AB (ref 70–99)
GLUCOSE-CAPILLARY: 235 mg/dL — AB (ref 70–99)
Glucose-Capillary: 105 mg/dL — ABNORMAL HIGH (ref 70–99)
Glucose-Capillary: 164 mg/dL — ABNORMAL HIGH (ref 70–99)
Glucose-Capillary: 130 mg/dL — ABNORMAL HIGH (ref 70–99)
Glucose-Capillary: 137 mg/dL — ABNORMAL HIGH (ref 70–99)

## 2013-04-14 LAB — HEMOGLOBIN AND HEMATOCRIT, BLOOD
HCT: 39.7 % (ref 39.0–52.0)
HEMOGLOBIN: 13.1 g/dL (ref 13.0–17.0)

## 2013-04-14 SURGERY — LAPAROSCOPIC ROUX-EN-Y GASTRIC BYPASS WITH UPPER ENDOSCOPY
Anesthesia: General | Site: Abdomen

## 2013-04-14 MED ORDER — UNJURY VANILLA POWDER: 2.0000 [oz_av] | Freq: Four times a day (QID) | ORAL | Status: DC

## 2013-04-14 MED ORDER — MIDAZOLAM HCL 5 MG/5ML IJ SOLN
INTRAMUSCULAR | Status: DC | PRN
  Administered 2013-04-14: 2 mg via INTRAVENOUS

## 2013-04-14 MED ORDER — ACETAMINOPHEN 160 MG/5ML PO SOLN: 325.0000 mg | ORAL | Status: DC | PRN

## 2013-04-14 MED ORDER — INSULIN ASPART 100 UNIT/ML ~~LOC~~ SOLN
0.0000 [IU] | SUBCUTANEOUS | Status: DC
  Administered 2013-04-14 – 2013-04-16 (×11): 3 [IU] via SUBCUTANEOUS

## 2013-04-14 MED ORDER — LACTATED RINGERS IV SOLN
INTRAVENOUS | Status: DC | PRN
  Administered 2013-04-14 (×2): via INTRAVENOUS

## 2013-04-14 MED ORDER — HYDROMORPHONE HCL PF 1 MG/ML IJ SOLN
0.2500 mg | INTRAMUSCULAR | Status: DC | PRN
  Administered 2013-04-14 (×2): 0.5 mg via INTRAVENOUS

## 2013-04-14 MED ORDER — GLYCOPYRROLATE 0.2 MG/ML IJ SOLN
INTRAMUSCULAR | Status: DC | PRN
  Administered 2013-04-14: .8 mg via INTRAVENOUS

## 2013-04-14 MED ORDER — CISATRACURIUM BESYLATE (PF) 10 MG/5ML IV SOLN
INTRAVENOUS | Status: DC | PRN
  Administered 2013-04-14: 4 mg via INTRAVENOUS
  Administered 2013-04-14: 2 mg via INTRAVENOUS
  Administered 2013-04-14: 4 mg via INTRAVENOUS
  Administered 2013-04-14: 8 mg via INTRAVENOUS

## 2013-04-14 MED ORDER — PROPOFOL 10 MG/ML IV BOLUS
INTRAVENOUS | Status: DC | PRN
  Administered 2013-04-14: 200 mg via INTRAVENOUS

## 2013-04-14 MED ORDER — UNJURY CHICKEN SOUP POWDER: 2.0000 [oz_av] | Freq: Four times a day (QID) | ORAL | Status: DC

## 2013-04-14 MED ORDER — DEXTROSE 5 % IV SOLN
INTRAVENOUS | Status: AC
  Filled 2013-04-14: qty 2

## 2013-04-14 MED ORDER — HEPARIN SODIUM (PORCINE) 5000 UNIT/ML IJ SOLN
5000.0000 [IU] | INTRAMUSCULAR | Status: AC
Start: 2013-04-14 — End: 2013-04-14
  Administered 2013-04-14: 5000 [IU] via SUBCUTANEOUS
  Filled 2013-04-14: qty 1

## 2013-04-14 MED ORDER — DEXAMETHASONE SODIUM PHOSPHATE 10 MG/ML IJ SOLN
INTRAMUSCULAR | Status: DC | PRN
  Administered 2013-04-14: 10 mg via INTRAVENOUS

## 2013-04-14 MED ORDER — GLYCOPYRROLATE 0.2 MG/ML IJ SOLN
INTRAMUSCULAR | Status: AC
  Filled 2013-04-14: qty 4

## 2013-04-14 MED ORDER — PHENYLEPHRINE 40 MCG/ML (10ML) SYRINGE FOR IV PUSH (FOR BLOOD PRESSURE SUPPORT)
PREFILLED_SYRINGE | INTRAVENOUS | Status: AC
  Filled 2013-04-14: qty 10

## 2013-04-14 MED ORDER — FENTANYL CITRATE 0.05 MG/ML IJ SOLN
INTRAMUSCULAR | Status: DC | PRN
  Administered 2013-04-14: 50 ug via INTRAVENOUS
  Administered 2013-04-14: 100 ug via INTRAVENOUS
  Administered 2013-04-14: 50 ug via INTRAVENOUS

## 2013-04-14 MED ORDER — MORPHINE SULFATE 2 MG/ML IJ SOLN
2.0000 mg | INTRAMUSCULAR | Status: DC | PRN
  Administered 2013-04-14 (×2): 2 mg via INTRAVENOUS
  Administered 2013-04-15: 4 mg via INTRAVENOUS
  Administered 2013-04-15 (×2): 6 mg via INTRAVENOUS
  Administered 2013-04-15 (×2): 2 mg via INTRAVENOUS
  Administered 2013-04-16: 6 mg via INTRAVENOUS
  Filled 2013-04-14: qty 1
  Filled 2013-04-14 (×2): qty 3
  Filled 2013-04-14: qty 1
  Filled 2013-04-14: qty 3
  Filled 2013-04-14: qty 1
  Filled 2013-04-14: qty 2
  Filled 2013-04-14: qty 1

## 2013-04-14 MED ORDER — PHENYLEPHRINE HCL 10 MG/ML IJ SOLN
INTRAMUSCULAR | Status: DC | PRN
  Administered 2013-04-14: 40 ug via INTRAVENOUS
  Administered 2013-04-14: 120 ug via INTRAVENOUS

## 2013-04-14 MED ORDER — SUCCINYLCHOLINE CHLORIDE 20 MG/ML IJ SOLN
INTRAMUSCULAR | Status: DC | PRN
  Administered 2013-04-14: 100 mg via INTRAVENOUS

## 2013-04-14 MED ORDER — BUPIVACAINE LIPOSOME 1.3 % IJ SUSP
20.0000 mL | Freq: Once | INTRAMUSCULAR | Status: AC
  Administered 2013-04-14: 20 mL
  Filled 2013-04-14: qty 20

## 2013-04-14 MED ORDER — PROMETHAZINE HCL 25 MG/ML IJ SOLN: 6.2500 mg | INTRAMUSCULAR | Status: DC | PRN

## 2013-04-14 MED ORDER — DEXTROSE 5 % IV SOLN
2.0000 g | INTRAVENOUS | Status: AC
  Administered 2013-04-14: 2 g via INTRAVENOUS

## 2013-04-14 MED ORDER — LACTATED RINGERS IV SOLN: INTRAVENOUS | Status: DC

## 2013-04-14 MED ORDER — NEOSTIGMINE METHYLSULFATE 1 MG/ML IJ SOLN
INTRAMUSCULAR | Status: DC | PRN
  Administered 2013-04-14: 5 mg via INTRAVENOUS

## 2013-04-14 MED ORDER — ACETAMINOPHEN 160 MG/5ML PO SOLN: 650.0000 mg | ORAL | Status: DC | PRN

## 2013-04-14 MED ORDER — NEOSTIGMINE METHYLSULFATE 1 MG/ML IJ SOLN
INTRAMUSCULAR | Status: AC
  Filled 2013-04-14: qty 10

## 2013-04-14 MED ORDER — LACTATED RINGERS IR SOLN
Status: DC | PRN
  Administered 2013-04-14: 1000 mL

## 2013-04-14 MED ORDER — DEXAMETHASONE SODIUM PHOSPHATE 10 MG/ML IJ SOLN
INTRAMUSCULAR | Status: AC
  Filled 2013-04-14: qty 1

## 2013-04-14 MED ORDER — FENTANYL CITRATE 0.05 MG/ML IJ SOLN
INTRAMUSCULAR | Status: AC
  Filled 2013-04-14: qty 5

## 2013-04-14 MED ORDER — TISSEEL VH 10 ML EX KIT
PACK | CUTANEOUS | Status: DC | PRN
  Administered 2013-04-14: 10 mL

## 2013-04-14 MED ORDER — OXYCODONE HCL 5 MG/5ML PO SOLN
5.0000 mg | ORAL | Status: DC | PRN
  Administered 2013-04-15 – 2013-04-16 (×3): 10 mg via ORAL
  Filled 2013-04-14 (×2): qty 10
  Filled 2013-04-14: qty 50

## 2013-04-14 MED ORDER — ONDANSETRON HCL 4 MG/2ML IJ SOLN: 4.0000 mg | INTRAMUSCULAR | Status: DC | PRN

## 2013-04-14 MED ORDER — ONDANSETRON HCL 4 MG/2ML IJ SOLN
INTRAMUSCULAR | Status: AC
  Filled 2013-04-14: qty 2

## 2013-04-14 MED ORDER — INSULIN ASPART 100 UNIT/ML ~~LOC~~ SOLN
10.0000 [IU] | Freq: Once | SUBCUTANEOUS | Status: AC
  Administered 2013-04-14: 10 [IU] via SUBCUTANEOUS

## 2013-04-14 MED ORDER — MIDAZOLAM HCL 2 MG/2ML IJ SOLN
INTRAMUSCULAR | Status: AC
  Filled 2013-04-14: qty 2

## 2013-04-14 MED ORDER — HEPARIN SODIUM (PORCINE) 5000 UNIT/ML IJ SOLN
5000.0000 [IU] | Freq: Three times a day (TID) | INTRAMUSCULAR | Status: DC
  Administered 2013-04-14 – 2013-04-16 (×5): 5000 [IU] via SUBCUTANEOUS
  Filled 2013-04-14 (×9): qty 1

## 2013-04-14 MED ORDER — ONDANSETRON HCL 4 MG/2ML IJ SOLN
INTRAMUSCULAR | Status: DC | PRN
  Administered 2013-04-14: 4 mg via INTRAVENOUS

## 2013-04-14 MED ORDER — CISATRACURIUM BESYLATE 20 MG/10ML IV SOLN
INTRAVENOUS | Status: AC
  Filled 2013-04-14: qty 10

## 2013-04-14 MED ORDER — UNJURY CHOCOLATE CLASSIC POWDER
2.0000 [oz_av] | Freq: Four times a day (QID) | ORAL | Status: DC
  Administered 2013-04-16: 2 [oz_av] via ORAL

## 2013-04-14 MED ORDER — CHLORHEXIDINE GLUCONATE CLOTH 2 % EX PADS: 6.0000 | MEDICATED_PAD | Freq: Once | CUTANEOUS | Status: DC

## 2013-04-14 MED ORDER — PROPOFOL 10 MG/ML IV BOLUS
INTRAVENOUS | Status: AC
  Filled 2013-04-14: qty 20

## 2013-04-14 MED ORDER — INSULIN ASPART 100 UNIT/ML ~~LOC~~ SOLN
SUBCUTANEOUS | Status: AC
  Filled 2013-04-14: qty 1

## 2013-04-14 MED ORDER — HYDROMORPHONE HCL PF 1 MG/ML IJ SOLN
INTRAMUSCULAR | Status: AC
  Filled 2013-04-14: qty 1

## 2013-04-14 MED ORDER — KCL IN DEXTROSE-NACL 20-5-0.45 MEQ/L-%-% IV SOLN
INTRAVENOUS | Status: AC
  Administered 2013-04-14: 14:00:00
  Filled 2013-04-14: qty 1000

## 2013-04-14 MED ORDER — KCL IN DEXTROSE-NACL 20-5-0.45 MEQ/L-%-% IV SOLN
INTRAVENOUS | Status: DC
  Administered 2013-04-14 – 2013-04-16 (×5): via INTRAVENOUS
  Filled 2013-04-14 (×6): qty 1000

## 2013-04-14 MED ORDER — CHLORHEXIDINE GLUCONATE CLOTH 2 % EX PADS
6.0000 | MEDICATED_PAD | Freq: Once | CUTANEOUS | Status: DC
Start: 2013-04-14 — End: 2013-04-14

## 2013-04-14 MED ORDER — TISSEEL VH 10 ML EX KIT
PACK | CUTANEOUS | Status: AC
  Filled 2013-04-14: qty 1

## 2013-04-14 SURGICAL SUPPLY — 69 items
APPLICATOR COTTON TIP 6IN STRL (MISCELLANEOUS) IMPLANT
BENZOIN TINCTURE PRP APPL 2/3 (GAUZE/BANDAGES/DRESSINGS) IMPLANT
BLADE SURG 15 STRL LF DISP TIS (BLADE) ×1 IMPLANT
BLADE SURG 15 STRL SS (BLADE) ×1
CABLE HIGH FREQUENCY MONO STRZ (ELECTRODE) IMPLANT
CANISTER SUCTION 2500CC (MISCELLANEOUS) ×2 IMPLANT
CLIP SUT LAPRA TY ABSORB (SUTURE) ×6 IMPLANT
DERMABOND ADVANCED (GAUZE/BANDAGES/DRESSINGS) ×1
DERMABOND ADVANCED .7 DNX12 (GAUZE/BANDAGES/DRESSINGS) ×1 IMPLANT
DEVICE SUTURE ENDOST 10MM (ENDOMECHANICALS) ×2 IMPLANT
DISSECTOR BLUNT TIP ENDO 5MM (MISCELLANEOUS) ×2 IMPLANT
DRAIN PENROSE 18X1/4 LTX STRL (WOUND CARE) ×2 IMPLANT
DRAPE CAMERA CLOSED 9X96 (DRAPES) ×2 IMPLANT
GAUZE SPONGE 4X4 16PLY XRAY LF (GAUZE/BANDAGES/DRESSINGS) ×2 IMPLANT
GLOVE BIO SURGEON STRL SZ7 (GLOVE) ×4 IMPLANT
GLOVE BIOGEL M 8.0 STRL (GLOVE) ×2 IMPLANT
GLOVE BIOGEL PI IND STRL 7.5 (GLOVE) ×1 IMPLANT
GLOVE BIOGEL PI INDICATOR 7.5 (GLOVE) ×1
GLOVE SS BIOGEL STRL SZ 7.5 (GLOVE) ×2 IMPLANT
GLOVE SUPERSENSE BIOGEL SZ 7.5 (GLOVE) ×2
GLOVE SURG SS PI 7.0 STRL IVOR (GLOVE) ×6 IMPLANT
GOWN STRL REUS W/TWL XL LVL3 (GOWN DISPOSABLE) ×12 IMPLANT
HANDLE STAPLE EGIA 4 XL (STAPLE) ×2 IMPLANT
HOVERMATT SINGLE USE (MISCELLANEOUS) ×2 IMPLANT
KIT BASIN OR (CUSTOM PROCEDURE TRAY) ×2 IMPLANT
KIT GASTRIC LAVAGE 34FR ADT (SET/KITS/TRAYS/PACK) ×4 IMPLANT
MARKER SKIN DUAL TIP RULER LAB (MISCELLANEOUS) ×2 IMPLANT
NEEDLE SPNL 22GX3.5 QUINCKE BK (NEEDLE) ×2 IMPLANT
NS IRRIG 1000ML POUR BTL (IV SOLUTION) ×2 IMPLANT
PACK CARDIOVASCULAR III (CUSTOM PROCEDURE TRAY) ×2 IMPLANT
RELOAD EGIA 45 MED/THCK PURPLE (STAPLE) ×2 IMPLANT
RELOAD EGIA 45 TAN VASC (STAPLE) ×2 IMPLANT
RELOAD EGIA 60 MED/THCK PURPLE (STAPLE) ×10 IMPLANT
RELOAD EGIA 60 TAN VASC (STAPLE) ×2 IMPLANT
RELOAD ENDO STITCH 2.0 (ENDOMECHANICALS) ×8
SCISSORS LAP 5X45 EPIX DISP (ENDOMECHANICALS) ×2 IMPLANT
SEALANT SURGICAL APPL DUAL CAN (MISCELLANEOUS) ×2 IMPLANT
SET IRRIG TUBING LAPAROSCOPIC (IRRIGATION / IRRIGATOR) ×2 IMPLANT
SHEARS CURVED HARMONIC AC 45CM (MISCELLANEOUS) ×2 IMPLANT
SLEEVE ADV FIXATION 12X100MM (TROCAR) ×4 IMPLANT
SLEEVE ADV FIXATION 5X100MM (TROCAR) ×2 IMPLANT
SOLUTION ANTI FOG 6CC (MISCELLANEOUS) ×2 IMPLANT
SPONGE GAUZE 4X4 12PLY (GAUZE/BANDAGES/DRESSINGS) ×2 IMPLANT
STAPLER VISISTAT 35W (STAPLE) ×2 IMPLANT
STRIP CLOSURE SKIN 1/2X4 (GAUZE/BANDAGES/DRESSINGS) IMPLANT
STRIP PERI DRY VERITAS 45 (STAPLE) IMPLANT
STRIP PERI DRY VERITAS 60 (STAPLE) ×6 IMPLANT
SUT DVC SILK 2.0X39 (SUTURE) ×4 IMPLANT
SUT DVC VICRYL PGA 2.0X39 (SUTURE) ×2 IMPLANT
SUT RELOAD ENDO STITCH 2 48X1 (ENDOMECHANICALS) ×2
SUT RELOAD ENDO STITCH 2.0 (ENDOMECHANICALS) ×6
SUT VIC AB 2-0 SH 27 (SUTURE) ×1
SUT VIC AB 2-0 SH 27X BRD (SUTURE) ×1 IMPLANT
SUT VIC AB 4-0 SH 18 (SUTURE) ×2 IMPLANT
SUTURE RELOAD END STTCH 2 48X1 (ENDOMECHANICALS) ×2 IMPLANT
SUTURE RELOAD ENDO STITCH 2.0 (ENDOMECHANICALS) ×6 IMPLANT
SYR 20CC LL (SYRINGE) ×2 IMPLANT
SYR 30ML LL (SYRINGE) ×2 IMPLANT
SYR 50ML LL SCALE MARK (SYRINGE) ×2 IMPLANT
TOWEL OR 17X26 10 PK STRL BLUE (TOWEL DISPOSABLE) ×4 IMPLANT
TOWEL OR NON WOVEN STRL DISP B (DISPOSABLE) ×2 IMPLANT
TRAY FOLEY CATH 14FRSI W/METER (CATHETERS) ×2 IMPLANT
TROCAR ADV FIXATION 12X100MM (TROCAR) ×4 IMPLANT
TROCAR ADV FIXATION 5X100MM (TROCAR) ×2 IMPLANT
TROCAR BLADELESS OPT 5 100 (ENDOMECHANICALS) ×2 IMPLANT
TROCAR XCEL 12X100 BLDLESS (ENDOMECHANICALS) ×2 IMPLANT
TUBING CONNECTING 10 (TUBING) ×2 IMPLANT
TUBING ENDO SMARTCAP PENTAX (MISCELLANEOUS) ×2 IMPLANT
TUBING FILTER THERMOFLATOR (ELECTROSURGICAL) ×2 IMPLANT

## 2013-04-14 NOTE — Progress Notes (Signed)
Spoke with patient in regards to nighttime CPAP--states preference to self administer when ready. Appropriate equipment at bedside, patient has home nasal pillows/tubing. Verbalizes understanding to call for RT if additional assistance needed.

## 2013-04-14 NOTE — Anesthesia Postprocedure Evaluation (Signed)
  Anesthesia Post-op Note  Patient: Ethan Pitts  Procedure(s) Performed: Procedure(s) (LRB): LAPAROSCOPIC ROUX-EN-Y GASTRIC BYPASS WITH UPPER ENDOSCOPY (N/A)  Patient Location: PACU  Anesthesia Type: General  Level of Consciousness: awake and alert   Airway and Oxygen Therapy: Patient Spontanous Breathing  Post-op Pain: mild  Post-op Assessment: Post-op Vital signs reviewed, Patient's Cardiovascular Status Stable, Respiratory Function Stable, Patent Airway and No signs of Nausea or vomiting  Last Vitals:  Filed Vitals:   04/14/13 1245  BP: 137/81  Pulse: 73  Temp: 36.8 C  Resp: 14    Post-op Vital Signs: stable   Complications: No apparent anesthesia complications

## 2013-04-14 NOTE — Progress Notes (Signed)
Hgb. And Hct. Drawn by lab. 

## 2013-04-14 NOTE — Telephone Encounter (Signed)
Pt needs ov to review sleep study 

## 2013-04-14 NOTE — Progress Notes (Signed)
Patient is alert and oriented, vital signs are stable, incisions are within normal limits, patient with no complaints of nausea or vomiting, patient up and ambulating around the nursing station and tolerating well, girlfriend at bedside and supportive, will continue to monitor Neta Mends RN 04-14-2013 18:39pm

## 2013-04-14 NOTE — Anesthesia Preprocedure Evaluation (Addendum)
Anesthesia Evaluation  Patient identified by MRN, date of birth, ID band Patient awake    Reviewed: Allergy & Precautions, H&P , NPO status , Patient's Chart, lab work & pertinent test results  Airway Mallampati: III TM Distance: <3 FB Neck ROM: Full    Dental no notable dental hx.    Pulmonary sleep apnea ,  breath sounds clear to auscultation  Pulmonary exam normal       Cardiovascular hypertension, Pt. on medications Rhythm:Regular Rate:Normal     Neuro/Psych negative neurological ROS  negative psych ROS   GI/Hepatic negative GI ROS, Neg liver ROS,   Endo/Other  diabetes  Renal/GU negative Renal ROS  negative genitourinary   Musculoskeletal negative musculoskeletal ROS (+)   Abdominal   Peds negative pediatric ROS (+)  Hematology negative hematology ROS (+)   Anesthesia Other Findings   Reproductive/Obstetrics negative OB ROS                          Anesthesia Physical Anesthesia Plan  ASA: III  Anesthesia Plan: General   Post-op Pain Management:    Induction: Intravenous  Airway Management Planned: Oral ETT  Additional Equipment:   Intra-op Plan:   Post-operative Plan: Extubation in OR  Informed Consent: I have reviewed the patients History and Physical, chart, labs and discussed the procedure including the risks, benefits and alternatives for the proposed anesthesia with the patient or authorized representative who has indicated his/her understanding and acceptance.   Dental advisory given  Plan Discussed with: CRNA and Surgeon  Anesthesia Plan Comments:         Anesthesia Quick Evaluation

## 2013-04-14 NOTE — Op Note (Signed)
Upper GI endoscopy is performed at the completion of laparoscopic Roux-en-Y gastric bypass by Dr. Martin. The Olympus video endoscope was inserted into the upper esophagus and then passed under direct vision to the EG junction. The small gastric pouch was insufflated with air while the gastric outlet was clamped under irrigation by the operating surgeon. There was no evidence of leak. The anastomosis was visualized and was patent. Suture and staple lines were intact and without bleeding. The pouch was tubular and measured 6-7 cm in length. At the completion of the procedure the pouch was desufflated and the scope withdrawn. 

## 2013-04-14 NOTE — Discharge Instructions (Signed)

## 2013-04-14 NOTE — Sleep Study (Signed)
   NAME: Ethan Pitts DATE OF BIRTH:  02/28/1960 MEDICAL RECORD NUMBER [id number]  LOCATION: [facility]  PHYSICIAN: Armando Reichert Rajanae Mantia  DATE OF STUDY: 04/03/2013  SLEEP STUDY TYPE: Nocturnal Polysomnogram               REFERRING PHYSICIAN: Marjarie Irion, Armando Reichert, MD  INDICATION FOR STUDY: Hypersomnia with sleep apnea  EPWORTH SLEEPINESS SCORE:  17 HEIGHT: 6\' 1"  (185.4 cm)  WEIGHT: 309 lb (140.161 kg)    Body mass index is 40.78 kg/(m^2).  NECK SIZE: 18 in.  MEDICATIONS: Reviewed in the sleep record  SLEEP ARCHITECTURE: The patient had a total sleep time of 320 minutes, with no slow-wave sleep and only 16 minutes of REM. Sleep onset latency was normal at 6 minutes, and REM onset was prolonged at 177 minutes. Sleep efficiency was moderately reduced at 82%.  RESPIRATORY DATA: The patient was found to have 20 central apneas, one mixed apnea, and 275 obstructive hypopneas, giving him an AHI of 56 events per hour. The events occurred in all body positions, and there was loud snoring noted throughout.  OXYGEN DATA: There was oxygen desaturation as low as 83% with the patient's obstructive events  CARDIAC DATA: Frequent PVCs were noted.  MOVEMENT/PARASOMNIA: No significant periodic limb movements or abnormal behaviors were noted.  IMPRESSION/ RECOMMENDATION:    1)  severe obstructive sleep apnea/hypopnea syndrome, with an AHI of 56 events per hour and oxygen desaturation as low as 83%. Treatment for this degree of sleep apnea should focus primarily on weight loss, and as well as a trial of CPAP.  2)  frequent PVCs were noted throughout. Clinical correlation is suggested.     [place], American Board of Sleep Medicine  ELECTRONICALLY SIGNED ON:  04/14/2013, 5:52 PM [place] PH: [phone]   FX: [phone] [place]

## 2013-04-14 NOTE — Op Note (Signed)
Surgeon: Althea Grimmer. Hassell Done, MD, FACS Asst:  Adonis Housekeeper, MD, FACS Anesthesia: General endotracheal Drains: None  Procedure: Laparoscopic Roux en Y gastric bypass with 40 cm BP limb and 100 cm Roux limb, antecolic, antegastric, candy cane to the left.  Closure of Peterson's defect. Upper endoscopy.   Description of Procedure:  The patient was taken to OR 1 at [facility] and given general anesthesia.  The abdomen was prepped with PCMX and draped sterilely.  A time out was performed.    The operation began by identifying the ligament of Treitz. I measured 40 cm downstream and divided the bowel with a 6 cm Covidian stapler.  I sutured a Penrose drain along the Roux limb end.  I measured a 1 meter (100 cm) Roux limb and then placed the distal bowels to the BP limb side by side and performed a stapled jejunojejunostomy. The common defect was closed from either end with 4-0 Vicryl using the Endo Stitch. The mesenteric defect was closed with a running 2-0 silk using the Endo Stitch. Tisseel was applied to the suture line.  The omentum was divided with the harmonic scalpel.  The Nathanson retractor was inserted in the left lateral segment of liver was retracted. The foregut dissection ensued.  There was a tremondous amount of foregut fat as well as omental fat.  I measued 6 cm down from the EG junction.  The lessor curvature was dissected with the harmonic and a single firing of the Covidien  6 cm purple was deployed.  I then aimed toward the EG junction.  There was a large posterior stomach stuck to the retroperitoneum.  After a second firing of the purple 6 cm, I had peristrips applied to the remaining loads and the pouch was completed.    The Roux limb was then brought up with the candycane pointed left and a back row of sutures of 2-0 Vicryl were placed. I opened along the right side of each structure and inserted the 4.5 cm stapler to create the gastrojejunostomy. The common defect was closed from either end with  2-0 Vicryl and a second row was placed anterior to that the Ewald tube acting as a stent across the anastomosis. The Penrose drain was removed. Peterson's defect was closed with 2-0 silk.   Endoscopy was performed by Dr. Excell Seltzer.  No leaks or bleeding were noted.   The incisions were injected with Exparel and were closed with 4-0 Vicryl and Dermabond.    The patient was taken to the recovery room in satisfactory condition.  Matt B. Hassell Done, MD, FACS

## 2013-04-14 NOTE — Transfer of Care (Signed)
Immediate Anesthesia Transfer of Care Note  Patient: Ethan Pitts  Procedure(s) Performed: Procedure(s): LAPAROSCOPIC ROUX-EN-Y GASTRIC BYPASS WITH UPPER ENDOSCOPY (N/A)  Patient Location: PACU  Anesthesia Type:General  Level of Consciousness: awake, sedated and patient cooperative  Airway & Oxygen Therapy: Patient Spontanous Breathing and Patient connected to face mask oxygen  Post-op Assessment: Report given to PACU RN and Post -op Vital signs reviewed and stable  Post vital signs: Reviewed and stable  Complications: No apparent anesthesia complications

## 2013-04-14 NOTE — H&P (View-Only) (Signed)
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 He has been followed by Dr. Ignacia Palma and has done quite a bit of research on bariatric surgery and desires to have a Roux-en-Y gastric bypass. Dr. Linna Darner has endorse this and he has been to one of our seminars. A 1 over Roux-en-Y gastric bypass with him using the props and he has a good understanding of the operation. He also indicated that he had watched U tube videos and was pretty convinced that he wanted to have a gastric bypass. He has been to our seminar and also has been to a seminar with his fiance.  His upper GI showed no hiatus hernia and his ultrasound showed no gallstones.  He is ready for surgery.    He has had DM for 4 years.    Past Medical History  Diagnosis Date  . Hyperlipidemia   . Hypertension   . Diabetes mellitus   . GERD (gastroesophageal reflux disease)     Past Surgical History  Procedure Laterality Date  . Tonsillectomy    . Adenoidectomy    . Breath tek h pylori N/A 01/08/2013    Procedure: BREATH TEK H PYLORI;  Surgeon: [name], MD;  Location: [facility];  Service: General;  Laterality: N/A;    Current Outpatient Prescriptions  Medication Sig Dispense Refill  . ALPRAZolam (XANAX) 0.5 MG tablet Take 1 tablet (0.5 mg total) by mouth every 8 (eight) hours as needed for sleep or anxiety.  30 tablet  0  . amLODipine (NORVASC) 5 MG tablet Take 5 mg by mouth every morning.      Marland Kitchen aspirin EC 81 MG tablet Take 81 mg by mouth daily.      . benazepril (LOTENSIN) 40 MG tablet Take 40 mg by mouth every morning.      . Canagliflozin (INVOKANA) 300 MG TABS Take 1 tablet (300 mg total) by mouth daily.  30 tablet  11  . carvedilol (COREG) 25 MG tablet Take 25 mg by mouth 2 (two) times daily with a meal.      . Insulin Pen Needle 31G X 5 MM MISC Use with  Victoza as directed  30 each  5  . Liraglutide (VICTOZA) 18 MG/3ML SOPN Inject 1.8 mg into the skin 1 day or 1 dose.  3 mL  3  . metFORMIN (GLUCOPHAGE-XR) 500 MG 24 hr tablet Take 500 mg by mouth 2 (two) times daily.      . naproxen sodium (ALEVE) 220 MG tablet Take 220-440 mg by mouth 2 (two) times daily as needed (Pain).      . nateglinide (STARLIX) 120 MG tablet Take 1 tablet (120 mg total) by mouth 3 (three) times daily before meals.  90 tablet  11  . Omega-3 Fatty Acids (FISH OIL) 500 MG CAPS Take 1 capsule by mouth 2 (two) times daily.      Marland Kitchen omeprazole (PRILOSEC) 20 MG capsule Take 20 mg by mouth at bedtime.      . triamcinolone cream (KENALOG) 0.1 % Apply 1 application topically 3 (three) times daily. As needed for itching  45 g  2  . HYDROcodone-acetaminophen (HYCET) 7.5-325 mg/15 ml solution Take 10 mLs by mouth 4 (four) times daily as needed for moderate pain.  300 mL  0   No current facility-administered medications for this visit.  Review of patient's allergies indicates no known allergies. Family History  Problem Relation Age of Onset  . Heart disease Father     valvular disease  . COPD Father   . Prostate cancer Father    Social History:   reports that he has never smoked. He quit smokeless tobacco use about 3 years ago. He reports that he drinks about 7.2 ounces of alcohol per week. He reports that he does not use illicit drugs.   REVIEW OF SYSTEMS - PERTINENT POSITIVES ONLY: Positive for hearing loss, visual disturbance, and rashes. He said no prior surgery except for tonsillectomy. His risk factors include diabetes and hypertension as well as obstructive sleep apnea. Otherwise his review of systems is negative  Physical Exam:   Blood pressure 140/86, pulse 78, resp. rate 18, height 6\' 1"  (1.854 m), weight 308 lb (139.708 kg). Body mass index is 40.64 kg/(m^2).  Gen:  WDWN WM NAD  Neurological: Alert and oriented to person, place, and time. Motor and sensory function  is grossly intact  Head: Normocephalic and atraumatic.  Eyes: Conjunctivae are normal. Pupils are equal, round, and reactive to light. No scleral icterus.  Neck: Normal range of motion. Neck supple. No tracheal deviation or thyromegaly present.  Cardiovascular:  SR without murmurs or gallops.  No carotid bruits Respiratory: Effort normal.  No respiratory distress. No chest wall tenderness. Breath sounds normal.  No wheezes, rales or rhonchi.  Abdomen:  No prior abdominal surgery GU: Musculoskeletal: Normal range of motion. Extremities are nontender. No cyanosis, edema or clubbing noted Lymphadenopathy: No cervical, preauricular, postauricular or axillary adenopathy is present Skin: Skin is warm and dry. No rash noted. No diaphoresis. No erythema. No pallor. Pscyh: Normal mood and affect. Behavior is normal. Judgment and thought content normal.   LABORATORY RESULTS: No results found for this or any previous visit (from the past 48 hour(s)).  RADIOLOGY RESULTS: No results found.  Problem List: Patient Active Problem List   Diagnosis Date Noted  . Trigeminal neuralgia 02/10/2012  . ERECTILE DYSFUNCTION 02/07/2010  . Morbid obesity 05/03/2009  . RHINITIS 05/03/2009  . DEGENERATIVE JOINT DISEASE 05/03/2009  . LOW BACK PAIN, CHRONIC 05/03/2009  . DIABETES MELLITUS, UNCONTROLLED 02/17/2009  . HYPERLIPIDEMIA 02/17/2009  . OBSTRUCTIVE SLEEP APNEA 08/23/2007  . OTHER MALAISE AND FATIGUE 08/06/2007  . NONSPECIFIC ABNORMAL ELECTROCARDIOGRAM 08/06/2007  . HEADACHE, CHRONIC 07/19/2006  . HYPERTENSION 05/22/2006    Assessment & Plan: Morbid obesity with DM and hypertension.  OSA treated with CPAP For Roux Y gastric bypass on Monday, April 13.    Matt B. Hassell Done, MD, [facility] [phone] beeper [phone]  04/03/2013 11:51 AM

## 2013-04-14 NOTE — Interval H&P Note (Signed)
History and Physical Interval Note:  04/14/2013 7:10 AM  Ethan Pitts  has presented today for surgery, with the diagnosis of morbid obesity   The various methods of treatment have been discussed with the patient and family. After consideration of risks, benefits and other options for treatment, the patient has consented to  Procedure(s): LAPAROSCOPIC ROUX-EN-Y GASTRIC BYPASS WITH UPPER ENDOSCOPY (N/A) as a surgical intervention .  The patient's history has been reviewed, patient examined, no change in status, stable for surgery.  I have reviewed the patient's chart and labs.  Questions were answered to the patient's satisfaction.     [name]

## 2013-04-14 NOTE — Progress Notes (Signed)
Patient is alert and oriented and vital signs are stable. Patient ambulated in hallway couple of times and tolerated well. Patient has active bowel sounds but no flatus or bowel movement. Patient is voided. Marlinda Mike (Student Nurse) 04/14/13. [phone]

## 2013-04-14 NOTE — Progress Notes (Signed)
Hgb. 13.1- Hct. 39.7 -Results noted

## 2013-04-15 ENCOUNTER — Encounter: Payer: Self-pay | Admitting: Pulmonary Disease

## 2013-04-15 ENCOUNTER — Inpatient Hospital Stay (HOSPITAL_COMMUNITY): Payer: BC Managed Care – PPO

## 2013-04-15 ENCOUNTER — Encounter (HOSPITAL_COMMUNITY): Payer: Self-pay | Admitting: Surgery

## 2013-04-15 DIAGNOSIS — Z09 Encounter for follow-up examination after completed treatment for conditions other than malignant neoplasm: Secondary | ICD-10-CM

## 2013-04-15 LAB — CBC WITH DIFFERENTIAL/PLATELET
BASOS ABS: 0 10*3/uL (ref 0.0–0.1)
Basophils Relative: 0 % (ref 0–1)
EOS PCT: 0 % (ref 0–5)
Eosinophils Absolute: 0 10*3/uL (ref 0.0–0.7)
HEMATOCRIT: 39.8 % (ref 39.0–52.0)
HEMOGLOBIN: 12.9 g/dL — AB (ref 13.0–17.0)
Lymphocytes Relative: 16 % (ref 12–46)
Lymphs Abs: 1.4 10*3/uL (ref 0.7–4.0)
MCH: 26.4 pg (ref 26.0–34.0)
MCHC: 32.4 g/dL (ref 30.0–36.0)
MCV: 81.6 fL (ref 78.0–100.0)
MONO ABS: 1.2 10*3/uL — AB (ref 0.1–1.0)
MONOS PCT: 13 % — AB (ref 3–12)
NEUTROS ABS: 6.4 10*3/uL (ref 1.7–7.7)
Neutrophils Relative %: 71 % (ref 43–77)
Platelets: 175 10*3/uL (ref 150–400)
RBC: 4.88 MIL/uL (ref 4.22–5.81)
RDW: 15 % (ref 11.5–15.5)
WBC: 8.9 10*3/uL (ref 4.0–10.5)

## 2013-04-15 LAB — GLUCOSE, CAPILLARY
GLUCOSE-CAPILLARY: 124 mg/dL — AB (ref 70–99)
GLUCOSE-CAPILLARY: 131 mg/dL — AB (ref 70–99)
Glucose-Capillary: 129 mg/dL — ABNORMAL HIGH (ref 70–99)
Glucose-Capillary: 128 mg/dL — ABNORMAL HIGH (ref 70–99)
Glucose-Capillary: 126 mg/dL — ABNORMAL HIGH (ref 70–99)
Glucose-Capillary: 142 mg/dL — ABNORMAL HIGH (ref 70–99)

## 2013-04-15 LAB — HEMOGLOBIN AND HEMATOCRIT, BLOOD
HCT: 40 % (ref 39.0–52.0)
Hemoglobin: 12.6 g/dL — ABNORMAL LOW (ref 13.0–17.0)

## 2013-04-15 MED ORDER — IOHEXOL 300 MG/ML  SOLN
50.0000 mL | Freq: Once | INTRAMUSCULAR | Status: AC | PRN
  Administered 2013-04-15: 50 mL via ORAL

## 2013-04-15 NOTE — Progress Notes (Addendum)
General Surgery Note  LOS: 1 day  POD -  1 Day Post-Op  Assessment/Plan: 1.  LAPAROSCOPIC ROUX-EN-Y GASTRIC BYPASS WITH UPPER ENDOSCOPY - 04/14/2013 - [employer] results pending.  He had to go down twice.  It looks to me that the pouch emptied slowly, but will await the final report.  Final report does show slow emptying at over an hour.  Will try water and see how he does.  Discussed with Vilinda Flake.  He is otherwise doing well.  2.  DVT prophylaxis - SQ Heparin   Principal Problem:   Lap Gastric Bypass April 2015  Subjective:  Doing okay.  Has walked.  His wife had RYGB by Dr. Redmond Pulling about 2 weeks ago!  She is in the room.  Objective:   Filed Vitals:   04/15/13 0452  BP: 140/80  Pulse: 61  Temp: 97.3 F (36.3 C)  Resp: 18     Intake/Output from previous day:  04/13 0701 - 04/14 0700 In: 4533.3 [I.V.:4533.3] Out: 2075 [Urine:1975; Blood:100]  Intake/Output this shift:  Total I/O In: -  Out: 400 [Urine:400]   Physical Exam:   General: Obese WM who is alert and oriented.    HEENT: Normal. Pupils equal. .   Lungs: Clear.   Abdomen: Soft, but large.   Wound: Clean.     Lab Results:    Recent Labs  04/14/13 1400 04/15/13 0502  WBC 9.1 8.9  HGB 13.3 12.9*  HCT 40.7 39.8  PLT 174 175    BMET   Recent Labs  04/14/13 1400  CREATININE 1.00    PT/INR  No results found for this basename: LABPROT, INR,  in the last 72 hours  ABG  No results found for this basename: PHART, PCO2, PO2, HCO3,  in the last 72 hours   Studies/Results:  No results found.   Anti-infectives:   Anti-infectives   Start     Dose/Rate Route Frequency Ordered Stop   04/14/13 0554  cefOXitin (MEFOXIN) 2 g in dextrose 5 % 50 mL IVPB     2 g 100 mL/hr over 30 Minutes Intravenous On call to O.R. 04/14/13 0554 04/14/13 0711      Alphonsa Overall, MD, FACS Pager: [place] [facility] Office: [phone] 04/15/2013

## 2013-04-15 NOTE — Telephone Encounter (Signed)
LMOM x 1 in addition to [employer]

## 2013-04-15 NOTE — Progress Notes (Signed)
Patient is alert and oriented and vital signs are stable. Patient ambulated in hallway several times today and tolerated well. Patient has active bowel sounds and flatus but no bowel movement. Patient is now on POD #1 instructions and tolerated water with no complains of nausea or vomiting. Will continue to monitor. Marlinda Mike (Student Nurse) 04/15/2013. [phone]

## 2013-04-15 NOTE — Progress Notes (Signed)
Nutrition Education Note  Patient identified via consult for DROP protocol.   Educated pt and wife on 2 week post op diet. Emphasis that liquids be not carbonated, not caffeinated, and sugar free. Protein shakes and bariatric multivitamins and minerals reviewed. Handouts of diet plan with vitamin schedule provided per pt request. Expect good compliance. Pt has follow up appointment scheduled with outpatient RD for further diet advancement.   Body mass index is 39.1 kg/(m^2). Patient meets criteria for class II obesity based on current BMI.   Diet: First 2 Weeks  You will see the nutritionist about two (2) weeks after your surgery. The nutritionist will increase the types of foods you can eat if you are handling liquids well:  If you have severe vomiting or nausea and cannot handle clear liquids lasting longer than 1 day, call your surgeon  Protein Shake  Drink at least 2 ounces of shake 5-6 times per day  Each serving of protein shakes (usually 8 - 12 ounces) should have a minimum of:  15 grams of protein  And no more than 5 grams of carbohydrate  Goal for protein each day:  Men = 80 grams per day  Women = 60 grams per day  Protein powder may be added to fluids such as non-fat milk or Lactaid milk or Soy milk (limit to 35 grams added protein powder per serving)   Hydration  Slowly increase the amount of water and other clear liquids as tolerated (See Acceptable Fluids)  Slowly increase the amount of protein shake as tolerated  Sip fluids slowly and throughout the day  May use sugar substitutes in small amounts (no more than 6 - 8 packets per day; i.e. Splenda)   Fluid Goal  The first goal is to drink at least 8 ounces of protein shake/drink per day (or as directed by the nutritionist); some examples of protein shakes are [employer], [employer], EAS Edge HP, and Unjury. See handout from pre-op Bariatric Education Class:  Slowly increase the amount of protein shake you drink as  tolerated  You may find it easier to slowly sip shakes throughout the day  It is important to get your proteins in first  Your fluid goal is to drink 64 - 100 ounces of fluid daily  It may take a few weeks to build up to this  32 oz (or more) should be clear liquids  And  32 oz (or more) should be full liquids (see below for examples)  Liquids should not contain sugar, caffeine, or carbonation   Clear Liquids:  Water or Sugar-free flavored water (i.e. Fruit H2O, Propel)  Decaffeinated coffee or tea (sugar-free)  Crystal Lite, Wyler's Lite, Minute Maid Lite  Sugar-free Jell-O  Bouillon or broth  Sugar-free Popsicle: *Less than 20 calories each; Limit 1 per day   Full Liquids:  Protein Shakes/Drinks + 2 choices per day of other full liquids  Full liquids must be:  No More Than 12 grams of Carbs per serving  No More Than 3 grams of Fat per serving  Strained low-fat cream soup  Non-Fat milk  Fat-free Lactaid Milk  Sugar-free yogurt (Dannon Lite & Fit, [place] yogurt)   [place] MS, [place], [place] Pager  [phone] After Hours Pager

## 2013-04-15 NOTE — Progress Notes (Signed)
Patient called stated pain medication(Morphine 2 mg) given 25 minutes prior did not work for him.  When discussed with staff/nursing student patient given additional 4 mg IV Morphine.  Patient's significant other at bedside emphatically expressed concern to this nurse "why is he not being adequately medicated." Explained patient has been medicated with 2 mg IV each time with patient stated relief.  Plan of care to manage pain control discussed with patient and significant other. Encouraged patient to ambulate to assist with pain relief.   Patient encouraged to let staff know if pain medication and ambulation does not help.  Patient verbalized understanding of plan of care with teach back significant other expresses desire to discuss with MD this am.  Encouraged patient and significant other to discuss with MD.  Call light within reach patient expresses pain relief at this time.

## 2013-04-15 NOTE — Care Management Note (Signed)
    Page 1 of 1   04/15/2013     2:24:54 PM   CARE MANAGEMENT NOTE 04/15/2013  Patient:  Ethan Pitts, Ethan Pitts   Account Number:  [id number]  Date Initiated:  04/15/2013  Documentation initiated by:  Sunday Spillers  Subjective/Objective Assessment:   53 yo male admitted s/p gastric bypass. PTA lived at home with friend.     Action/Plan:   Home when stable   Anticipated DC Date:  04/17/2013   Anticipated DC Plan:  [place]  CM consult      Choice offered to / List presented to:             Status of service:  Completed, signed off Medicare Important Message given?  NA - LOS <3 / Initial given by admissions (If response is "NO", the following Medicare IM given date fields will be blank) Date Medicare IM given:   Date Additional Medicare IM given:    Discharge Disposition:  HOME/SELF CARE  Per UR Regulation:  Reviewed for med. necessity/level of care/duration of stay  If discussed at [place] of Stay Meetings, dates discussed:    Comments:

## 2013-04-15 NOTE — Telephone Encounter (Signed)
Pt sent a MyChart message about sleep study results. Advised him that [facility] wants him to come in to discuss results.  Will route message to [place] to follow up on.

## 2013-04-15 NOTE — Progress Notes (Signed)
Patient alert and oriented, Post op day 1.  Provided support and encouragement.  Encouraged pulmonary toilet, ambulation and small sips of liquids.  All questions answered.  Will continue to monitor. 

## 2013-04-15 NOTE — Progress Notes (Signed)
*  PRELIMINARY RESULTS* Vascular Ultrasound Lower extremity venous duplex has been completed.  Preliminary findings: no evidence of DVT.   Chapman Fitch RVT 04/15/2013, 8:43 AM

## 2013-04-15 NOTE — Progress Notes (Signed)
Pt prefers to self administer CPAP when ready, RT will assist as needed.

## 2013-04-16 LAB — CBC WITH DIFFERENTIAL/PLATELET
BASOS PCT: 1 % (ref 0–1)
Basophils Absolute: 0 10*3/uL (ref 0.0–0.1)
EOS ABS: 0.1 10*3/uL (ref 0.0–0.7)
EOS PCT: 2 % (ref 0–5)
HCT: 36.2 % — ABNORMAL LOW (ref 39.0–52.0)
HEMOGLOBIN: 11.7 g/dL — AB (ref 13.0–17.0)
Lymphocytes Relative: 29 % (ref 12–46)
Lymphs Abs: 1.9 10*3/uL (ref 0.7–4.0)
MCH: 26.2 pg (ref 26.0–34.0)
MCHC: 32.3 g/dL (ref 30.0–36.0)
MCV: 81.2 fL (ref 78.0–100.0)
Monocytes Absolute: 0.8 10*3/uL (ref 0.1–1.0)
Monocytes Relative: 12 % (ref 3–12)
NEUTROS PCT: 57 % (ref 43–77)
Neutro Abs: 3.8 10*3/uL (ref 1.7–7.7)
Platelets: 153 10*3/uL (ref 150–400)
RBC: 4.46 MIL/uL (ref 4.22–5.81)
RDW: 15 % (ref 11.5–15.5)
WBC: 6.6 10*3/uL (ref 4.0–10.5)

## 2013-04-16 LAB — GLUCOSE, CAPILLARY
Glucose-Capillary: 133 mg/dL — ABNORMAL HIGH (ref 70–99)
Glucose-Capillary: 132 mg/dL — ABNORMAL HIGH (ref 70–99)
Glucose-Capillary: 116 mg/dL — ABNORMAL HIGH (ref 70–99)

## 2013-04-16 NOTE — Discharge Summary (Addendum)
Physician Discharge Summary  Patient ID:  IVON OELKERS  MRN: [id number]  DOB/AGE: [birth year] 53 y.o.  Admit date: 04/14/2013 Discharge date: 04/16/2013  Discharge Diagnoses:  1.  Morbid obesity, weight - 308, BMI 40.6 2.  Diabetes Mellitus 3.  OSA 4.  HTN   Principal Problem:   Lap Gastric Bypass April 2015  Operation: Procedure(s): LAPAROSCOPIC ROUX-EN-Y GASTRIC BYPASS WITH UPPER ENDOSCOPY on 04/14/2013 - [facility]  Discharged Condition: good  Hospital Course: SHAUGHN THOMLEY is an 53 y.o. male whose primary care physician is Unice Cobble, MD and who was admitted 04/14/2013 with a chief complaint of morbid obesity.   He was brought to the operating room on 04/14/2013 and underwent  LAPAROSCOPIC ROUX-EN-Y GASTRIC BYPASS WITH UPPER ENDOSCOPY.  The patient had a swallow on post op day # 1 - it showed slow emptying of the pouch.  The dopplers were negative. The patient tolerated water yesterday and protein drinks today.  He is doing very well and anxious to go home.  His wife had a gastric bypass about two weeks ago, he has been through the post op course with his wife, and they have literature at home.  His wife is in the room and she is very anxious to go home.  He has follow up with Dr. Loanne Drilling in about a week to check his diabetes.  He knows to check his blood sugars at home.  He has an appt to see Dr. Hassell Done next week.  The discharge instructions were reviewed with the patient.  Consults: None  Significant Diagnostic Studies: Results for orders placed during the hospital encounter of 04/14/13  GLUCOSE, CAPILLARY      Result Value Ref Range   Glucose-Capillary 105 (*) 70 - 99 mg/dL  GLUCOSE, CAPILLARY      Result Value Ref Range   Glucose-Capillary 235 (*) 70 - 99 mg/dL   Comment 1 Documented in Chart     Comment 2 Call MD NNP PA CNM    HEMOGLOBIN AND HEMATOCRIT, BLOOD      Result Value Ref Range   Hemoglobin 13.1  13.0 - 17.0 g/dL   HCT 39.7  39.0 - 52.0 %   GLUCOSE, CAPILLARY      Result Value Ref Range   Glucose-Capillary 164 (*) 70 - 99 mg/dL  CBC      Result Value Ref Range   WBC 9.1  4.0 - 10.5 K/uL   RBC 5.09  4.22 - 5.81 MIL/uL   Hemoglobin 13.3  13.0 - 17.0 g/dL   HCT 40.7  39.0 - 52.0 %   MCV 80.0  78.0 - 100.0 fL   MCH 26.1  26.0 - 34.0 pg   MCHC 32.7  30.0 - 36.0 g/dL   RDW 14.7  11.5 - 15.5 %   Platelets 174  150 - 400 K/uL  CREATININE, SERUM      Result Value Ref Range   Creatinine, Ser 1.00  0.50 - 1.35 mg/dL   GFR calc non Af Amer 85 (*) >90 mL/min   GFR calc Af Amer >90  >90 mL/min  GLUCOSE, CAPILLARY      Result Value Ref Range   Glucose-Capillary 130 (*) 70 - 99 mg/dL  CBC WITH DIFFERENTIAL      Result Value Ref Range   WBC 8.9  4.0 - 10.5 K/uL   RBC 4.88  4.22 - 5.81 MIL/uL   Hemoglobin 12.9 (*) 13.0 - 17.0 g/dL   HCT 39.8  39.0 -  52.0 %   MCV 81.6  78.0 - 100.0 fL   MCH 26.4  26.0 - 34.0 pg   MCHC 32.4  30.0 - 36.0 g/dL   RDW 15.0  11.5 - 15.5 %   Platelets 175  150 - 400 K/uL   Neutrophils Relative % 71  43 - 77 %   Neutro Abs 6.4  1.7 - 7.7 K/uL   Lymphocytes Relative 16  12 - 46 %   Lymphs Abs 1.4  0.7 - 4.0 K/uL   Monocytes Relative 13 (*) 3 - 12 %   Monocytes Absolute 1.2 (*) 0.1 - 1.0 K/uL   Eosinophils Relative 0  0 - 5 %   Eosinophils Absolute 0.0  0.0 - 0.7 K/uL   Basophils Relative 0  0 - 1 %   Basophils Absolute 0.0  0.0 - 0.1 K/uL  GLUCOSE, CAPILLARY      Result Value Ref Range   Glucose-Capillary 137 (*) 70 - 99 mg/dL  HEMOGLOBIN AND HEMATOCRIT, BLOOD      Result Value Ref Range   Hemoglobin 12.6 (*) 13.0 - 17.0 g/dL   HCT 40.0  39.0 - 52.0 %  GLUCOSE, CAPILLARY      Result Value Ref Range   Glucose-Capillary 123 (*) 70 - 99 mg/dL  GLUCOSE, CAPILLARY      Result Value Ref Range   Glucose-Capillary 124 (*) 70 - 99 mg/dL   Comment 1 Notify RN    GLUCOSE, CAPILLARY      Result Value Ref Range   Glucose-Capillary 129 (*) 70 - 99 mg/dL  GLUCOSE, CAPILLARY      Result Value Ref  Range   Glucose-Capillary 131 (*) 70 - 99 mg/dL  GLUCOSE, CAPILLARY      Result Value Ref Range   Glucose-Capillary 128 (*) 70 - 99 mg/dL  CBC WITH DIFFERENTIAL      Result Value Ref Range   WBC 6.6  4.0 - 10.5 K/uL   RBC 4.46  4.22 - 5.81 MIL/uL   Hemoglobin 11.7 (*) 13.0 - 17.0 g/dL   HCT 36.2 (*) 39.0 - 52.0 %   MCV 81.2  78.0 - 100.0 fL   MCH 26.2  26.0 - 34.0 pg   MCHC 32.3  30.0 - 36.0 g/dL   RDW 15.0  11.5 - 15.5 %   Platelets 153  150 - 400 K/uL   Neutrophils Relative % 57  43 - 77 %   Neutro Abs 3.8  1.7 - 7.7 K/uL   Lymphocytes Relative 29  12 - 46 %   Lymphs Abs 1.9  0.7 - 4.0 K/uL   Monocytes Relative 12  3 - 12 %   Monocytes Absolute 0.8  0.1 - 1.0 K/uL   Eosinophils Relative 2  0 - 5 %   Eosinophils Absolute 0.1  0.0 - 0.7 K/uL   Basophils Relative 1  0 - 1 %   Basophils Absolute 0.0  0.0 - 0.1 K/uL  GLUCOSE, CAPILLARY      Result Value Ref Range   Glucose-Capillary 126 (*) 70 - 99 mg/dL  GLUCOSE, CAPILLARY      Result Value Ref Range   Glucose-Capillary 142 (*) 70 - 99 mg/dL  GLUCOSE, CAPILLARY      Result Value Ref Range   Glucose-Capillary 133 (*) 70 - 99 mg/dL  GLUCOSE, CAPILLARY      Result Value Ref Range   Glucose-Capillary 132 (*) 70 - 99 mg/dL    Dg Ugi W/water  Sol Cm  04/15/2013   CLINICAL DATA:  Gastric bypass surgery.  EXAM: WATER SOLUBLE UPPER GI SERIES  TECHNIQUE: Single-column upper GI series was performed using water soluble contrast.  CONTRAST:  47mL OMNIPAQUE IOHEXOL 300 MG/ML  SOLN  COMPARISON:  01/22/2013  FLUOROSCOPY TIME:  3 min and 8 seconds  FINDINGS: The esophagus is unremarkable. No hiatal hernia or GE reflux. The gastric pouch was very slow to empty. This could be due to diffuse abdominal ileus and or edema at the anastomosis. Potentially a stomach did empty into the jejunum. No complicating features are demonstrated. There is moderate gaseous distention of the small bowel and colon consistent with an ileus.  IMPRESSION: Gastric bypass  surgical changes without complicating features. No leaking oral contrast.  Diffuse abdominal ileus.   Electronically Signed   By: Kalman Jewels M.D.   On: 04/15/2013 13:42    Discharge Exam:  Filed Vitals:   04/16/13 0502  BP: 144/78  Pulse: 73  Temp: 98 F (36.7 C)  Resp: 18    General: Obese WN WM who is alert and generally healthy appearing.  Lungs: Clear to auscultation and symmetric breath sounds. Heart:  RRR. No murmur or rub. Abdomen: Soft. Wounds looks good.  BS present.  Discharge Medications:     Medication List         ALEVE 220 MG tablet  Generic drug:  naproxen sodium  Take 220-440 mg by mouth 2 (two) times daily as needed (Pain).     ALPRAZolam 0.5 MG tablet  Commonly known as:  XANAX  Take 1 tablet (0.5 mg total) by mouth every 8 (eight) hours as needed for sleep or anxiety.     amLODipine 5 MG tablet  Commonly known as:  NORVASC  Take 5 mg by mouth every morning.     aspirin EC 81 MG tablet  Take 81 mg by mouth daily.     benazepril 40 MG tablet  Commonly known as:  LOTENSIN  Take 40 mg by mouth every morning.     Canagliflozin 300 MG Tabs  Commonly known as:  INVOKANA  Take 1 tablet (300 mg total) by mouth daily.     carvedilol 25 MG tablet  Commonly known as:  COREG  Take 25 mg by mouth 2 (two) times daily with a meal.     Fish Oil 500 MG Caps  Take 1 capsule by mouth 2 (two) times daily.     HYDROcodone-acetaminophen 7.5-325 mg/15 ml solution  Commonly known as:  HYCET  Take 10 mLs by mouth 4 (four) times daily as needed for moderate pain.     Insulin Pen Needle 31G X 5 MM Misc  Use with Victoza as directed     Liraglutide 18 MG/3ML Sopn  Commonly known as:  VICTOZA  Inject 1.8 mg into the skin 1 day or 1 dose.     metFORMIN 500 MG 24 hr tablet  Commonly known as:  GLUCOPHAGE-XR  Take 500 mg by mouth 2 (two) times daily.     nateglinide 120 MG tablet  Commonly known as:  STARLIX  Take 1 tablet (120 mg total) by mouth 3  (three) times daily before meals.     omeprazole 20 MG capsule  Commonly known as:  PRILOSEC  Take 20 mg by mouth at bedtime.     triamcinolone cream 0.1 %  Commonly known as:  KENALOG  Apply 1 application topically 3 (three) times daily. As needed for itching  Disposition: 01-Home or Self Care      Discharge Orders   Future Appointments Provider Department Dept Phone   04/23/2013 2:10 PM [name], MD [facility], [place] [phone]   04/29/2013 3:30 PM [facility] [phone]   09/15/2013 1:30 PM Kathee Delton, MD [facility] [phone]   Future Orders Complete By Expires   Increase activity slowly  As directed       Follow-up Information   Follow up with [name], MD.   Specialty:  General Surgery   Contact information:   [address] [place] [address] [phone]        Signed: Alphonsa Overall, M.D., [facility] Office:  [phone]  04/16/2013, 12:14 PM

## 2013-04-16 NOTE — Telephone Encounter (Signed)
Pt scheduled for appt with [facility] 04/18/13 at 215 to review sleep study

## 2013-04-17 ENCOUNTER — Telehealth (HOSPITAL_COMMUNITY): Payer: Self-pay

## 2013-04-17 NOTE — Telephone Encounter (Signed)
Made discharge phone call to patient per DROP protocol. Asking the following questions.    1. Do you have someone to care for you now that you are home?  yes 2. Are you having pain now that is not relieved by your pain medication?  no 3. Are you able to drink the recommended daily amount of fluids (48 ounces minimum/day) and protein (60-80 grams/day) as prescribed by the dietitian or nutritional counselor?  yes 4. Are you taking the vitamins and minerals as prescribed?  yes 5. Do you have the "on call" number to contact your surgeon if you have a problem or question?  yes 6. Are your incisions free of redness, swelling or drainage? (If steri strips, address that these can fall off, shower as tolerated) yes 7. Have your bowels moved since your surgery?  If not, are you passing gas?  Yes 8. Are you up and walking 3-4 times per day?  yes 9. Do you have an appointment to see a dietitian or nutritional counselor in the next month?  yes  The following questions can be added at the center's discharge phone call script at the center's discretion.  The questions are also captured within [employer] project custom fields. 1. Do you have an appointment made to see your surgeon in the next month?  yes 2. Were you provided your discharge medications before your surgery or before you were discharged from the hospital and are you taking them without problem?  yes 3. Were you provided phone numbers to the clinic/surgeon's office?  yes 4. Did you watch the patient education video module in the (clinic, surgeon's office, etc.) before your surgery? yes 5. Do you have a discharge checklist that was provided to you in the hospital to reference with instructions on how to take care of yourself after surgery?  yes 6. Did you see a dietitian or nutritional counselor while you were in the hospital?  yes

## 2013-04-18 ENCOUNTER — Encounter: Payer: Self-pay | Admitting: Pulmonary Disease

## 2013-04-18 ENCOUNTER — Other Ambulatory Visit: Payer: Self-pay | Admitting: *Deleted

## 2013-04-18 ENCOUNTER — Ambulatory Visit (INDEPENDENT_AMBULATORY_CARE_PROVIDER_SITE_OTHER): Payer: BC Managed Care – PPO | Admitting: Pulmonary Disease

## 2013-04-18 ENCOUNTER — Encounter: Payer: Self-pay | Admitting: Internal Medicine

## 2013-04-18 VITALS — BP 158/96 | HR 69 | Temp 97.7°F | Ht 73.0 in | Wt 293.4 lb

## 2013-04-18 DIAGNOSIS — G4733 Obstructive sleep apnea (adult) (pediatric): Secondary | ICD-10-CM

## 2013-04-18 MED ORDER — AMLODIPINE BESYLATE 5 MG PO TABS: 5.0000 mg | ORAL_TABLET | Freq: Every morning | ORAL | Status: DC

## 2013-04-18 NOTE — Telephone Encounter (Signed)
Sent email needing refill on his norvasc...Ethan Pitts

## 2013-04-18 NOTE — Patient Instructions (Signed)
Will get your cpap machine and set on the auto setting.  Please call if having tolerance issues. Work on weight loss followup with me again in 32mos.

## 2013-04-18 NOTE — Progress Notes (Signed)
   Subjective:    Patient ID: ULYSEES ROBARTS, male    DOB: 13-Jan-1960, 53 y.o.   MRN: [id number]  HPI The patient comes in today for followup of his recent sleep study. He was found to have severe OSA, with an AHI of 56 events per hour and oxygen desaturation as low as 83%. I have reviewed the study with him in detail, and answered all of his questions. Of note, the patient has had a recent bariatric surgery.   Review of Systems  Constitutional: Negative for fever and unexpected weight change.  HENT: Negative for congestion, dental problem, ear pain, nosebleeds, postnasal drip, rhinorrhea, sinus pressure, sneezing, sore throat and trouble swallowing.   Eyes: Negative for redness and itching.  Respiratory: Negative for cough, chest tightness, shortness of breath and wheezing.   Cardiovascular: Negative for palpitations and leg swelling.  Gastrointestinal: Negative for nausea and vomiting.  Genitourinary: Negative for dysuria.  Musculoskeletal: Negative for joint swelling.  Skin: Negative for rash.  Neurological: Negative for headaches.  Hematological: Does not bruise/bleed easily.  Psychiatric/Behavioral: Negative for dysphoric mood. The patient is not nervous/anxious.        Objective:   Physical Exam Obese male in no acute distress Nose without purulence or discharge noted Neck without lymphadenopathy or thyromegaly Lower extremities with minimal edema, no cyanosis Alert and oriented, moves all 4 extremities appear       Assessment & Plan:

## 2013-04-18 NOTE — Assessment & Plan Note (Signed)
The patient's current sleep study shows severe OSA, with an AHI of 56 events per hour. He will obviously need to be restarted on CPAP, and will use the automatic setting for treatment. This will allow for changes and pressure as the patient drops weight. I've also asked him to keep up with his mask changes and supplies.

## 2013-04-23 ENCOUNTER — Encounter: Payer: Self-pay | Admitting: Endocrinology

## 2013-04-23 ENCOUNTER — Encounter: Payer: Self-pay | Admitting: Internal Medicine

## 2013-04-23 ENCOUNTER — Encounter (INDEPENDENT_AMBULATORY_CARE_PROVIDER_SITE_OTHER): Payer: Self-pay | Admitting: Surgery

## 2013-04-23 ENCOUNTER — Ambulatory Visit (INDEPENDENT_AMBULATORY_CARE_PROVIDER_SITE_OTHER): Payer: BC Managed Care – PPO | Admitting: Surgery

## 2013-04-23 VITALS — BP 126/72 | HR 74 | Temp 97.5°F | Ht 73.0 in | Wt 287.0 lb

## 2013-04-23 DIAGNOSIS — Z9884 Bariatric surgery status: Secondary | ICD-10-CM

## 2013-04-23 NOTE — Patient Instructions (Signed)
Miralax for constipation

## 2013-04-23 NOTE — Progress Notes (Signed)
Ethan Pitts 53 y.o.  Body mass index is 37.87 kg/(m^2).  Patient Active Problem List   Diagnosis Date Noted  . 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 Trigeminal neuralgia 02/10/2012  . ERECTILE DYSFUNCTION 02/07/2010  . Morbid obesity 05/03/2009  . RHINITIS 05/03/2009  . DEGENERATIVE JOINT DISEASE 05/03/2009  . LOW BACK PAIN, CHRONIC 05/03/2009  . DIABETES MELLITUS, UNCONTROLLED 02/17/2009  . HYPERLIPIDEMIA 02/17/2009  . OBSTRUCTIVE SLEEP APNEA 08/23/2007  . OTHER MALAISE AND FATIGUE 08/06/2007  . NONSPECIFIC ABNORMAL ELECTROCARDIOGRAM 08/06/2007  . HEADACHE, CHRONIC 07/19/2006  . HYPERTENSION 05/22/2006    No Known Allergies  Past Surgical History  Procedure Laterality Date  . Tonsillectomy    . Adenoidectomy    . Breath tek h pylori N/A 01/08/2013    Procedure: BREATH TEK H PYLORI;  Surgeon: [name], MD;  Location: [facility];  Service: General;  Laterality: N/A;  . Gastric roux-en-y N/A 04/14/2013    Procedure: LAPAROSCOPIC ROUX-EN-Y GASTRIC BYPASS WITH UPPER ENDOSCOPY;  Surgeon: [name], MD;  Location: [facility];  Service: General;  Laterality: N/A;   Unice Cobble, MD No diagnosis found.  Almost 2 weeks out from his laparotomy gastric bypass. He is doing well. Incisions look good. Not having problems with nausea vomiting. Will see him back in 6 weeks. Matt B. Hassell Done, MD, [facility] [phone] beeper [phone]  04/23/2013 2:39 PM

## 2013-04-29 ENCOUNTER — Encounter: Payer: BC Managed Care – PPO | Attending: Surgery

## 2013-04-29 DIAGNOSIS — Z713 Dietary counseling and surveillance: Secondary | ICD-10-CM | POA: Insufficient documentation

## 2013-04-30 NOTE — Progress Notes (Signed)
Bariatric Class:  Appt start time: 1530 end time:  1630.  2 Week Post-Operative Nutrition Class  Patient was seen on 04/30/2013 for Post-Operative [facility].   Surgery date: 04/14/2013 Surgery type: RYGB Start weight at [facility]: 312 on 01/22/2013 Weight today: 291.0 lbs Weight change: 22.5 lbs  TANITA  BODY COMP RESULTS  03/20/2013 04/30/13   BMI (kg/m^2) 41.4 38.4   Fat Mass (lbs) 141 107.0   Fat Free Mass (lbs) 172.5 184.0   Total Body Water (lbs) 126.5 134.5   The following the learning objectives were met by the patient during this course:  Identifies Phase 3A (Soft, High Proteins) Dietary Goals and will begin from 2 weeks post-operatively to 2 months post-operatively  Identifies appropriate sources of fluids and proteins   States protein recommendations and appropriate sources post-operatively  Identifies the need for appropriate texture modifications, mastication, and bite sizes when consuming solids  Identifies appropriate multivitamin and calcium sources post-operatively  Describes the need for physical activity post-operatively and will follow MD recommendations  States when to call healthcare provider regarding medication questions or post-operative complications  Handouts given during class include:  Phase 3A: Soft, High Protein Diet Handout  Follow-Up Plan: Patient will follow-up at [facility] in 6 weeks for 2 month post-op nutrition visit for diet advancement per MD.

## 2013-04-30 NOTE — Patient Instructions (Signed)
Patient to follow Phase 3A-Soft, High Protein Diet and follow-up at [facility] in 6 weeks for 2 months post-op nutrition visit for diet advancement. 

## 2013-05-01 ENCOUNTER — Encounter: Payer: Self-pay | Admitting: Endocrinology

## 2013-05-07 ENCOUNTER — Encounter: Payer: Self-pay | Admitting: Endocrinology

## 2013-05-07 ENCOUNTER — Ambulatory Visit: Payer: BC Managed Care – PPO | Admitting: Internal Medicine

## 2013-05-07 ENCOUNTER — Ambulatory Visit (INDEPENDENT_AMBULATORY_CARE_PROVIDER_SITE_OTHER): Payer: BC Managed Care – PPO | Admitting: Endocrinology

## 2013-05-07 VITALS — BP 116/80 | HR 83 | Temp 98.1°F | Ht 73.0 in | Wt 283.0 lb

## 2013-05-07 DIAGNOSIS — E1165 Type 2 diabetes mellitus with hyperglycemia: Principal | ICD-10-CM

## 2013-05-07 DIAGNOSIS — IMO0001 Reserved for inherently not codable concepts without codable children: Secondary | ICD-10-CM

## 2013-05-07 MED ORDER — METFORMIN HCL ER 500 MG PO TB24: 500.0000 mg | ORAL_TABLET | Freq: Two times a day (BID) | ORAL | Status: DC

## 2013-05-07 MED ORDER — GLUCOSE BLOOD VI STRP: 1.0000 | ORAL_STRIP | Freq: Every day | Status: DC

## 2013-05-07 NOTE — Patient Instructions (Signed)
For now, please continue just the metformin and victoza. With further weight-loss, you may be able to stop the victoza. check your blood sugar once a day.  vary the time of day when you check, between before the 3 meals, and at bedtime.  also check if you have symptoms of your blood sugar being too high or too low.  please keep a record of the readings and bring it to your next appointment here.  You can write it on any piece of paper.  please call us sooner if your blood sugar goes below 70, or if you have a lot of readings over 200. Please come back for a follow-up appointment in 2 months.

## 2013-05-07 NOTE — Progress Notes (Signed)
Subjective:    Patient ID: Ethan Pitts, male    DOB: 09-29-1960, 53 y.o.   MRN: [id number]  HPI pt returns for f/u of insulin-requiring DM (dx'ed 2006, on a routine blood test; he has mild if any neuropathy of the lower extremities; he is unaware of any associated chronic complications; he has never been on insulin).  He had gastric bypass 3 weeks ago.  So far, he has lost 20 lbs so far (including pre-surgery, he has lost 35 lbs).  Since surgery, he has not taken invokana or nateglinide.  he brings a record of his cbg's (all checked in am) which i have reviewed today.  It varies from 130-150.  He resumed the metformin and victoza 3 days ago.  Since then, fasting cbg's have improved to approx 110.  Past Medical History  Diagnosis Date  . Hyperlipidemia   . Hypertension   . Diabetes mellitus   . GERD (gastroesophageal reflux disease)   . Sleep apnea     cpap settings at 3  . Anxiety   . Arthritis     lower back     Past Surgical History  Procedure Laterality Date  . Tonsillectomy    . Adenoidectomy    . Breath tek h pylori N/A 01/08/2013    Procedure: BREATH TEK H PYLORI;  Surgeon: [name], MD;  Location: [facility];  Service: General;  Laterality: N/A;  . Gastric roux-en-y N/A 04/14/2013    Procedure: LAPAROSCOPIC ROUX-EN-Y GASTRIC BYPASS WITH UPPER ENDOSCOPY;  Surgeon: [name], MD;  Location: [facility];  Service: General;  Laterality: N/A;    History   Social History  . Marital Status: Single    Spouse Name: N/A    Number of Children: N/A  . Years of Education: N/A   Occupational History  . 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 Smoking status: Never Smoker   . Smokeless tobacco: Former Systems developer    Quit date: 02/06/2010  . Alcohol Use: 7.2 oz/week    12 Cans of beer per week     Comment:  12pk/per week  . Drug Use: No  . Sexual Activity: Not on file   Other Topics Concern  . Not on file   Social History Narrative  . No narrative on  file    Current Outpatient Prescriptions on File Prior to Visit  Medication Sig Dispense Refill  . ALPRAZolam (XANAX) 0.5 MG tablet Take 1 tablet (0.5 mg total) by mouth every 8 (eight) hours as needed for sleep or anxiety.  30 tablet  0  . amLODipine (NORVASC) 5 MG tablet Take 1 tablet (5 mg total) by mouth every morning.  90 tablet  1  . aspirin EC 81 MG tablet Take 81 mg by mouth daily.      . benazepril (LOTENSIN) 40 MG tablet Take 40 mg by mouth every morning.      Marland Kitchen HYDROcodone-acetaminophen (HYCET) 7.5-325 mg/15 ml solution Take 10 mLs by mouth 4 (four) times daily as needed for moderate pain.  300 mL  0  . Insulin Pen Needle 31G X 5 MM MISC Use with Victoza as directed  30 each  5  . Liraglutide (VICTOZA) 18 MG/3ML SOPN Inject 1.8 mg into the skin 1 day or 1 dose.  3 mL  3  . naproxen sodium (ALEVE) 220 MG tablet Take 220-440 mg by mouth 2 (two) times daily as needed (Pain).      . Omega-3 Fatty  Acids (FISH OIL) 500 MG CAPS Take 1 capsule by mouth 2 (two) times daily.      Marland Kitchen omeprazole (PRILOSEC) 20 MG capsule Take 20 mg by mouth at bedtime.      . triamcinolone cream (KENALOG) 0.1 % Apply 1 application topically 3 (three) times daily. As needed for itching  45 g  2   No current facility-administered medications on file prior to visit.   No Known Allergies  Family History  Problem Relation Age of Onset  . Heart disease Father     valvular disease  . COPD Father   . Prostate cancer Father    BP 116/80  Pulse 83  Temp(Src) 98.1 F (36.7 C) (Oral)  Ht 6\' 1"  (1.854 m)  Wt 283 lb (128.368 kg)  BMI 37.35 kg/m2  SpO2 95%  Review of Systems He denies hypoglycemia and n/v.      Objective:   Physical Exam VITAL SIGNS:  See vs page GENERAL: no distress.  Morbid obesity Ext: no edema      Assessment & Plan:  DM: improved control with resumption of meds. Morbid obesity, better: with further improvement, he may be able to reduce meds again.

## 2013-05-08 ENCOUNTER — Other Ambulatory Visit: Payer: Self-pay

## 2013-05-08 MED ORDER — CARVEDILOL 25 MG PO TABS: 25.0000 mg | ORAL_TABLET | Freq: Two times a day (BID) | ORAL | Status: DC

## 2013-06-04 ENCOUNTER — Ambulatory Visit (INDEPENDENT_AMBULATORY_CARE_PROVIDER_SITE_OTHER): Payer: BC Managed Care – PPO | Admitting: Surgery

## 2013-06-10 ENCOUNTER — Encounter: Payer: BC Managed Care – PPO | Attending: Surgery | Admitting: Dietician

## 2013-06-10 DIAGNOSIS — Z713 Dietary counseling and surveillance: Secondary | ICD-10-CM | POA: Insufficient documentation

## 2013-06-10 NOTE — Progress Notes (Signed)
  Follow-up visit:  8 Weeks Post-Operative RYGB Surgery  Medical Nutrition Therapy:  Appt start time: 7654 end time:  1115.  Primary concerns today: Post-operative Bariatric Surgery Nutrition Management.  Gerald Stabs returns today having lost 17 pounds since his last visit. He reports no weight loss for the past 2.5 weeks. He states that he is able to eat anything he wants but just in smaller portions. He went to a [employer] race for 2 weeks and did not follow post op dietary recommendations. He is able to drink beer, had 4 at one time at the race track.  Surgery date: 04/14/2013 Surgery type: RYGB Start weight at [facility]: 312 on 01/22/2013 Weight today: 274 lbs Weight change: 17 lbs Total weight lost: 38 lbs  TANITA  BODY COMP RESULTS  03/20/2013 04/30/13 06/10/2013   BMI (kg/m^2) 41.4 38.4 36.2   Fat Mass (lbs) 141 107.0 98   Fat Free Mass (lbs) 172.5 184.0 176   Total Body Water (lbs) 126.5 134.5 129    Preferred Learning Style:  No preference indicated   Learning Readiness:  Ready  24-hr recall: B (AM): [place] spam with 1 egg and cheese in a tortilla (14g) Snk (AM): Lean shake (30g) L (PM): BK hamburger with cheese and bacon with 1/2 the bread and 1/2 small onion ring order (14-21g) Snk (PM): yogurt  D (PM): pasta and grilled chicken with alfredo sauce (21g) Snk (PM): popsicle  Fluid intake: about 64 oz per patient, but inconsistent; water, Kool Aid with Splenda, soft drinks, coffee Estimated total protein intake: 80+ grams  Medications: off of some diabetes meds per patient Supplementation: taking  CBG monitoring: 1-2x a week  Average CBG per patient: under 120 mg/dL Last patient reported A1c: none taken since surgery  Using straws: not usually Drinking while eating: sips Hair loss: no Carbonated beverages: yes N/V/D/C: none Dumping syndrome: none  Recent physical activity:  Started walking, 1-2x a week  Progress Towards Goal(s):  Some progress.  Handouts given during  visit include:  Phase 3B lean protein + non-starchy vegetables  Phase 3A lean proteins (patient requested)   Nutritional Diagnosis:  [place]-3.3 Overweight/obesity related to past poor dietary habits and physical inactivity as evidenced by patient w/ recent RYGB surgery following dietary guidelines for continued weight loss.     Intervention:  Nutrition counseling provided.  Teaching Method Utilized:  Visual Auditory  Barriers to learning/adherence to lifestyle change: food preferences and ability to eat a variety of foods  Demonstrated degree of understanding via:  Teach Back   Monitoring/Evaluation:  Dietary intake, exercise, and body weight. Follow up in 1 months for 3 month post-op visit.

## 2013-06-10 NOTE — Patient Instructions (Addendum)
Goals:  Follow Phase 3B: High Protein + Non-Starchy Vegetables  Eat 3-6 small meals/snacks, every 3-5 hrs  Increase lean protein foods to meet 80g goal  Increase fluid intake to 64oz +  Add 15 grams of carbohydrate (fruit, whole grain, starchy vegetable) with meals  Avoid drinking 15 minutes before, during and 30 minutes after eating  Aim for >30 min of physical activity daily  Avoid fried foods and carbonated beverages   TANITA  BODY COMP RESULTS  03/20/2013 04/30/13 06/10/2013   BMI (kg/m^2) 41.4 38.4 36.2   Fat Mass (lbs) 141 107.0 98   Fat Free Mass (lbs) 172.5 184.0 176   Total Body Water (lbs) 126.5 134.5 129

## 2013-06-13 ENCOUNTER — Other Ambulatory Visit: Payer: Self-pay | Admitting: Endocrinology

## 2013-06-20 ENCOUNTER — Encounter: Payer: Self-pay | Admitting: Pulmonary Disease

## 2013-06-27 ENCOUNTER — Ambulatory Visit (INDEPENDENT_AMBULATORY_CARE_PROVIDER_SITE_OTHER): Payer: BC Managed Care – PPO | Admitting: Surgery

## 2013-06-27 ENCOUNTER — Encounter (INDEPENDENT_AMBULATORY_CARE_PROVIDER_SITE_OTHER): Payer: Self-pay | Admitting: Surgery

## 2013-06-27 VITALS — BP 120/85 | HR 77 | Temp 98.3°F | Resp 14 | Ht 73.0 in | Wt 269.6 lb

## 2013-06-27 DIAGNOSIS — Z9884 Bariatric surgery status: Secondary | ICD-10-CM

## 2013-06-27 NOTE — Patient Instructions (Signed)
Thanks for your patience.  If you need further assistance after leaving the office, please call our office and speak with a [facility] nurse.  [phone].  If you want to leave a message for Dr. Martin, please call his office phone at [phone]. 

## 2013-06-27 NOTE — Progress Notes (Signed)
Ethan Pitts 53 y.o.  Body mass index is 35.58 kg/(m^2).  Patient Active Problem List   Diagnosis Date Noted  . 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 Trigeminal neuralgia 02/10/2012  . ERECTILE DYSFUNCTION 02/07/2010  . Morbid obesity 05/03/2009  . RHINITIS 05/03/2009  . DEGENERATIVE JOINT DISEASE 05/03/2009  . LOW BACK PAIN, CHRONIC 05/03/2009  . DIABETES MELLITUS, UNCONTROLLED 02/17/2009  . HYPERLIPIDEMIA 02/17/2009  . OBSTRUCTIVE SLEEP APNEA 08/23/2007  . OTHER MALAISE AND FATIGUE 08/06/2007  . NONSPECIFIC ABNORMAL ELECTROCARDIOGRAM 08/06/2007  . HEADACHE, CHRONIC 07/19/2006  . HYPERTENSION 05/22/2006    No Known Allergies    Past Surgical History  Procedure Laterality Date  . Tonsillectomy    . Adenoidectomy    . Breath tek h pylori N/A 01/08/2013    Procedure: BREATH TEK H PYLORI;  Surgeon: [name], MD;  Location: [facility];  Service: General;  Laterality: N/A;  . Gastric roux-en-y N/A 04/14/2013    Procedure: LAPAROSCOPIC ROUX-EN-Y GASTRIC BYPASS WITH UPPER ENDOSCOPY;  Surgeon: [name], MD;  Location: [facility];  Service: General;  Laterality: N/A;   Unice Cobble, MD No diagnosis found.  Has lost 38 lbs since his surgery on April 3.  DM is much better controlled.  His only complaint is constipation.   Will see him back in October which will be 6 months.    Matt B. Hassell Done, MD, [facility] [phone] beeper [phone]  06/27/2013 2:17 PM

## 2013-07-01 ENCOUNTER — Encounter: Payer: Self-pay | Admitting: Internal Medicine

## 2013-07-01 ENCOUNTER — Encounter: Payer: Self-pay | Admitting: Pulmonary Disease

## 2013-07-01 NOTE — Telephone Encounter (Signed)
Message     need follow upbefore July 22.What dates do you have available   Called spoke with patient - he called the office earlier today and scheduled appt w/ [place] for 7.22.15 but stated would like an earlier appt if possible.  [place] [facility] with a cancellation tomorrow at 1145 >> appt rescheduled to that appt date/time.  Will sign off.

## 2013-07-02 ENCOUNTER — Ambulatory Visit (INDEPENDENT_AMBULATORY_CARE_PROVIDER_SITE_OTHER): Payer: BC Managed Care – PPO | Admitting: Pulmonary Disease

## 2013-07-02 ENCOUNTER — Encounter: Payer: Self-pay | Admitting: Pulmonary Disease

## 2013-07-02 VITALS — BP 128/88 | HR 76 | Temp 98.1°F | Ht 73.0 in | Wt 271.2 lb

## 2013-07-02 DIAGNOSIS — G4733 Obstructive sleep apnea (adult) (pediatric): Secondary | ICD-10-CM

## 2013-07-02 NOTE — Patient Instructions (Signed)
Continue on cpap, and keep up with mask changes and supplies Work on weight loss followup with me again in 24mos (cancel any upcoming apptms)

## 2013-07-02 NOTE — Assessment & Plan Note (Signed)
The patient is doing fairly well with CPAP based on his download. He also feels is helping his sleep and daytime alertness. I've asked him to continue on his device, and to work aggressively on weight loss. I will see him back in 6 months if doing well.

## 2013-07-02 NOTE — Progress Notes (Signed)
   Subjective:    Patient ID: Ethan Pitts, male    DOB: October 10, 1960, 53 y.o.   MRN: [id number]  HPI The patient comes in today for followup of his obstructive sleep apnea. He is wearing CPAP fairly compliantly by his download, with excellent control of his obstructive events and no significant mask leak. The patient feels the CPAP is working very well for him, but will actually use his old device on some nights when he is working. He is having no issues with his mask fit or pressure.   Review of Systems  Constitutional: Negative for fever and unexpected weight change.  HENT: Negative for congestion, dental problem, ear pain, nosebleeds, postnasal drip, rhinorrhea, sinus pressure, sneezing, sore throat and trouble swallowing.   Eyes: Negative for redness and itching.  Respiratory: Negative for cough, chest tightness, shortness of breath and wheezing.   Cardiovascular: Negative for palpitations and leg swelling.  Gastrointestinal: Negative for nausea and vomiting.  Genitourinary: Negative for dysuria.  Musculoskeletal: Negative for joint swelling.  Skin: Negative for rash.  Neurological: Negative for headaches.  Hematological: Does not bruise/bleed easily.  Psychiatric/Behavioral: Negative for dysphoric mood. The patient is not nervous/anxious.        Objective:   Physical Exam Obese male in no acute distress Nose without purulence or discharge noted No skin breakdown or pressure necrosis from the CPAP Neck without lymphadenopathy or thyromegaly Lower extremities with no significant edema, no cyanosis Alert and oriented, moves all 4 extremities.       Assessment & Plan:

## 2013-07-07 ENCOUNTER — Encounter: Payer: Self-pay | Admitting: Endocrinology

## 2013-07-07 ENCOUNTER — Ambulatory Visit (INDEPENDENT_AMBULATORY_CARE_PROVIDER_SITE_OTHER): Payer: BC Managed Care – PPO | Admitting: Endocrinology

## 2013-07-07 VITALS — BP 132/86 | HR 83 | Temp 98.6°F | Ht 73.0 in | Wt 270.0 lb

## 2013-07-07 DIAGNOSIS — E1165 Type 2 diabetes mellitus with hyperglycemia: Principal | ICD-10-CM

## 2013-07-07 DIAGNOSIS — IMO0001 Reserved for inherently not codable concepts without codable children: Secondary | ICD-10-CM

## 2013-07-07 LAB — HEMOGLOBIN A1C: HEMOGLOBIN A1C: 5.9 % (ref 4.6–6.5)

## 2013-07-07 NOTE — Progress Notes (Signed)
Subjective:    Patient ID: Ethan Pitts, male    DOB: [birth year], 53 y.o.   MRN: [id number]  HPI pt returns for f/u of insulin-requiring DM (dx'ed 2006, on a routine blood test; he has mild if any neuropathy of the lower extremities; he is unaware of any associated chronic complications; he has never been on insulin; he had gastric bypass in April of 2015).  So far, he has lost 33 lbs so far (including pre-surgery, he has lost 52 lbs).  Since surgery, he has not taken invokana or nateglinide.  he brings a record of his cbg's (all checked in am) which i have reviewed today.  It varies from 130-150.  He resumed the metformin and victoza 3 days ago.  Since then, fasting cbg's have improved to approx 110.  Past Medical History  Diagnosis Date  . Hyperlipidemia   . Hypertension   . Diabetes mellitus   . GERD (gastroesophageal reflux disease)   . Sleep apnea     cpap settings at 3  . Anxiety   . Arthritis     lower back     Past Surgical History  Procedure Laterality Date  . Tonsillectomy    . Adenoidectomy    . Breath tek h pylori N/A 01/08/2013    Procedure: BREATH TEK H PYLORI;  Surgeon: [name], MD;  Location: [facility];  Service: General;  Laterality: N/A;  . Gastric roux-en-y N/A 04/14/2013    Procedure: LAPAROSCOPIC ROUX-EN-Y GASTRIC BYPASS WITH UPPER ENDOSCOPY;  Surgeon: [name], MD;  Location: [facility];  Service: General;  Laterality: N/A;    History   Social History  . Marital Status: Single    Spouse Name: N/A    Number of Children: N/A  . Years of Education: N/A   Occupational History  . 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 Smoking status: Never Smoker   . Smokeless tobacco: Former Systems developer    Quit date: 02/06/2010  . Alcohol Use: 7.2 oz/week    12 Cans of beer per week     Comment:  12pk/per week  . Drug Use: No  . Sexual Activity: Not on file   Other Topics Concern  . Not on file   Social History Narrative  . No narrative  on file    Current Outpatient Prescriptions on File Prior to Visit  Medication Sig Dispense Refill  . ALPRAZolam (XANAX) 0.5 MG tablet Take 1 tablet (0.5 mg total) by mouth every 8 (eight) hours as needed for sleep or anxiety.  30 tablet  0  . amLODipine (NORVASC) 5 MG tablet Take 1 tablet (5 mg total) by mouth every morning.  90 tablet  1  . aspirin EC 81 MG tablet Take 81 mg by mouth daily.      . benazepril (LOTENSIN) 40 MG tablet Take 40 mg by mouth every morning.      Marland Kitchen glucose blood (BAYER CONTOUR NEXT TEST) test strip 1 each by Other route daily. And lancets qd  100 each  12  . Insulin Pen Needle 31G X 5 MM MISC Use with Victoza as directed  30 each  5  . metFORMIN (GLUCOPHAGE-XR) 500 MG 24 hr tablet Take 1 tablet (500 mg total) by mouth 2 (two) times daily.  180 tablet  3  . Omega-3 Fatty Acids (FISH OIL) 500 MG CAPS Take 1 capsule by mouth 2 (two) times daily.      Marland Kitchen omeprazole (PRILOSEC) 20  MG capsule Take 20 mg by mouth at bedtime.      Marland Kitchen VICTOZA 18 MG/3ML SOPN INJECT 1.8MG  INTO THE SKIN EVERY DAY  3 pen  3   No current facility-administered medications on file prior to visit.    No Known Allergies  Family History  Problem Relation Age of Onset  . Heart disease Father     valvular disease  . COPD Father   . Prostate cancer Father     BP 132/86  Pulse 83  Temp(Src) 98.6 F (37 C) (Oral)  Ht 6\' 1"  (1.854 m)  Wt 270 lb (122.471 kg)  BMI 35.63 kg/m2  SpO2 95%  Review of Systems He denies hypoglycemia and n/v.      Objective:   Physical Exam Pulses: dorsalis pedis intact bilat.   Feet: no deformity. normal color and temp.  no edema Skin:  no ulcer on the feet.   Neuro: sensation is intact to touch on the feet.  Lab Results  Component Value Date   HGBA1C 5.9 07/07/2013  i reviewed electrocardiogram (1/15)    Assessment & Plan:  DM: control is much better. Bariatric surgery status: good progress.  Patient is advised the following: Patient Instructions    blood tests are being requested for you today.  We'll contact you with results. With further weight-loss, you may be able to stop the victoza. check your blood sugar once a day.  vary the time of day when you check, between before the 3 meals, and at bedtime.  also check if you have symptoms of your blood sugar being too high or too low.  please keep a record of the readings and bring it to your next appointment here.  You can write it on any piece of paper.  please call us sooner if your blood sugar goes below 70, or if you have a lot of readings over 200. Please come back for a follow-up appointment in 3 months.     Please reduce the victoza to 0.6 mg daily

## 2013-07-07 NOTE — Patient Instructions (Addendum)
blood tests are being requested for you today.  We'll contact you with results. With further weight-loss, you may be able to stop the victoza. check your blood sugar once a day.  vary the time of day when you check, between before the 3 meals, and at bedtime.  also check if you have symptoms of your blood sugar being too high or too low.  please keep a record of the readings and bring it to your next appointment here.  You can write it on any piece of paper.  please call us sooner if your blood sugar goes below 70, or if you have a lot of readings over 200. Please come back for a follow-up appointment in 3 months.

## 2013-07-08 ENCOUNTER — Ambulatory Visit: Payer: BC Managed Care – PPO | Admitting: Dietician

## 2013-07-09 ENCOUNTER — Other Ambulatory Visit: Payer: Self-pay

## 2013-07-09 MED ORDER — BENAZEPRIL HCL 40 MG PO TABS: 40.0000 mg | ORAL_TABLET | Freq: Every morning | ORAL | Status: DC

## 2013-07-14 ENCOUNTER — Encounter: Payer: Self-pay | Admitting: Endocrinology

## 2013-07-22 ENCOUNTER — Ambulatory Visit: Payer: BC Managed Care – PPO | Admitting: Pulmonary Disease

## 2013-07-23 ENCOUNTER — Other Ambulatory Visit: Payer: Self-pay

## 2013-07-23 ENCOUNTER — Ambulatory Visit: Payer: BC Managed Care – PPO | Admitting: Pulmonary Disease

## 2013-07-23 MED ORDER — LIRAGLUTIDE 18 MG/3ML ~~LOC~~ SOPN: PEN_INJECTOR | SUBCUTANEOUS | Status: DC

## 2013-08-06 ENCOUNTER — Ambulatory Visit: Payer: BC Managed Care – PPO | Admitting: Dietician

## 2013-09-11 ENCOUNTER — Ambulatory Visit (INDEPENDENT_AMBULATORY_CARE_PROVIDER_SITE_OTHER): Payer: BC Managed Care – PPO | Admitting: Internal Medicine

## 2013-09-11 ENCOUNTER — Encounter: Payer: Self-pay | Admitting: Internal Medicine

## 2013-09-11 VITALS — BP 118/72 | HR 67 | Temp 97.9°F | Wt 275.1 lb

## 2013-09-11 DIAGNOSIS — L03031 Cellulitis of right toe: Secondary | ICD-10-CM

## 2013-09-11 DIAGNOSIS — L03039 Cellulitis of unspecified toe: Secondary | ICD-10-CM

## 2013-09-11 DIAGNOSIS — L600 Ingrowing nail: Secondary | ICD-10-CM

## 2013-09-11 DIAGNOSIS — Z1211 Encounter for screening for malignant neoplasm of colon: Secondary | ICD-10-CM

## 2013-09-11 MED ORDER — CEPHALEXIN 500 MG PO CAPS: 500.0000 mg | ORAL_CAPSULE | Freq: Two times a day (BID) | ORAL | Status: DC

## 2013-09-11 MED ORDER — KETOCONAZOLE 2 % EX CREA: 1.0000 "application " | TOPICAL_CREAM | Freq: Two times a day (BID) | CUTANEOUS | Status: DC

## 2013-09-11 NOTE — Progress Notes (Signed)
Pre visit review using our clinic review tool, if applicable. No additional management support is needed unless otherwise documented below in the visit note. 

## 2013-09-11 NOTE — Progress Notes (Signed)
   Subjective:    Patient ID: Ethan Pitts, male    DOB: September 18, 1960, 53 y.o.   MRN: [id number]  HPI   6 weeks ago he began to have pain at the site of an ingrown toenail of the right great toe. He had clipped the nail at an acute angle. Subsequently he developed some cellulitis. He used a sterile instrument to attempt to drain this. He noted pus and blood at that time.  He now has pain wearing shoes and ambulating.  He is now requesting referral to podiatrist.  He is also inquiring as to colonoscopic surveillance as per standard of care.  Review of Systems  He had bariatric surgery 04/14/13. His most recent A1c 07/08/13 was 5.9%. His Victoza dose has been decreased to 0.6 mg daily. He's also on metformin 500 mg twice a day. FBS 109-120. His nutritional status since bariatric surgery has not been optimal.      Objective:   Physical Exam    Pertinent positive findings included a 4 x 5 mm granuloma at the lateral aspect of the right great toenail. There is mild erythema around this granuloma. There is no active drainage.  The nail itself is slightly deformed and thickened.  Pedal pulses are excellent as is hair growth. There are no ischemic changes noted.        Assessment & Plan:  Number on paronychia  #2 ingrown toenail  #3 diabetes resolved with bariatric surgery  #4 due for colonoscopic surveillance based on age.  Plan: See orders and recommendations

## 2013-09-11 NOTE — Patient Instructions (Signed)
Dip gauze in  sterile saline and applied to the wound twice a day. Apply Nizoral & blow dry. The saline can be purchased at the drugstore or you can make your own .Boil cup of salt in a gallon of water. Store mixture  in a clean container.Report Warning  signs as discussed (red streaks, pus, fever, increasing pain).     Eat a low-fat diet with lots of fruits and vegetables, up to 7-9 servings per day. Consume less than 40  Grams (preferably ZERO) of sugar per day from foods & drinks with High Fructose Corn Syrup (HFCS) sugar as #1,2,3 or # 4 on label.[employer], [employer] do not carry products with HFCS. Follow a  low carb nutrition program such as [place]phys or The New Sugar Busters  to prevent Diabetes progression . White carbohydrates (potatoes, rice, bread, and pasta) have a high spike of sugar and a high load of sugar. For example a  baked potato has a cup of sugar and a  french fry  2 teaspoons of sugar. Yams, wild  rice, whole grained bread &  wheat pasta have been much lower spike and load of  sugar. Portions should be the size of a deck of cards or your palm.

## 2013-09-15 ENCOUNTER — Ambulatory Visit: Payer: BC Managed Care – PPO | Admitting: Pulmonary Disease

## 2013-09-19 ENCOUNTER — Ambulatory Visit (INDEPENDENT_AMBULATORY_CARE_PROVIDER_SITE_OTHER): Payer: BC Managed Care – PPO | Admitting: Podiatry

## 2013-09-19 ENCOUNTER — Encounter: Payer: Self-pay | Admitting: Podiatry

## 2013-09-19 DIAGNOSIS — L03039 Cellulitis of unspecified toe: Secondary | ICD-10-CM

## 2013-09-19 NOTE — Patient Instructions (Addendum)
Betadine Soak Instructions--- you can use epsom salts  Purchase an 8 oz. bottle of BETADINE solution (Povidone)  THE DAY AFTER THE PROCEDURE  Place 1 tablespoon of betadine solution in a quart of warm tap water.  Submerge your foot or feet with outer bandage intact for the initial soak; this will allow the bandage to become moist and wet for easy lift off.  Once you remove your bandage, continue to soak in the solution for 20 minutes.  This soak should be done twice a day.  Next, remove your foot or feet from solution, blot dry the affected area and cover.  You may use a band aid large enough to cover the area or use gauze and tape.  Apply other medications to the area as directed by the doctor such as cortisporin otic solution (ear drops) or neosporin.  IF YOUR SKIN BECOMES IRRITATED WHILE USING THESE INSTRUCTIONS, IT IS OKAY TO SWITCH TO EPSOM SALTS AND WATER OR WHITE VINEGAR AND WATER.  Monitor for any signs/symptoms of infection. Call the office immediately if any occur or go directly to the emergency room. Call with any questions/concerns.

## 2013-09-19 NOTE — Progress Notes (Signed)
   Subjective:    Patient ID: Ethan Pitts, male    DOB: 1960/11/14, 53 y.o.   MRN: [id number]  HPI  Ethan Pitts, 53 year old male, returns the office today with a right lateral hallux ingrown toenail. Patient states it is his been ongoing for approximately 8 weeks. He had first cut out the ingrown toenail himself and he noticed purulence and some bloody drainage. Since then he has continued to have discomfort and drainage from around the nail site. He saw his primary care physician for which she was placed on Keflex. Continues to have pain and drainage from the nail site. No other complaints at this time.    Review of Systems  Skin:       Right big toe nail pain       Objective:   Physical Exam AAO x3, NAD DP/PT pulses palpable 2/4 b/l. CRT < 3 sec, pedal hair present Protective sensation intact with Derrel Nip monofilament, vibratory sensation intact, Achilles tendon reflex intact. Right lateral hallux nail border with a small amount of granuloma tissue and the lateral nail border. Edema over the lateral nail site as well as a localized erythema. No ascending cellulitis. Tenderness to palpation over the lateral nail border. No purulence or drainage identified. No open lesions.  MMT 5/5, ROM WNL Her leg pain, swelling, warmth.     Assessment & Plan:   53 year old male with right lateral hallux nail paronychia  -Conservative versus surgical treatment were discussed including alternatives, risks, complications.  -At this time the patient elects to proceed with a partial nail avulsion.  -Under standard skin preparation a total of 2.5 cc of a one-to-one mixture of 0.5% Marcaine plain and 2% lidocaine plain was infiltrated in a hallux block fashion to the right foot. The skin was then prepped in normal sterile fashion. Next the lateral portion of the right hallux nail was then sharply excised making sure to remove the entire nail border. No purulence was identified. A large  portion of nail was identified under the lateral nail border. The area was then flushed and hemostasis was achieved. Silvadene was applied followed by a dry sterile dressing. A tourniquet was utilized for the procedure and after application of the dressing it was removed and there was noted to be an immediate capillary refill time noted to the digit. Patient tolerated the procedure well without complications.  -Post procedure instructions were discussed the patient in detail for which he verbally understood  -Monitor for any clinical signs or symptoms of worsening infection and directed to call the office immediately if any are to occur. Followup in 1 week or sooner if any problems are to arise. Call any questions or concerns.

## 2013-10-03 ENCOUNTER — Ambulatory Visit (INDEPENDENT_AMBULATORY_CARE_PROVIDER_SITE_OTHER): Payer: BC Managed Care – PPO | Admitting: Podiatry

## 2013-10-03 ENCOUNTER — Encounter: Payer: Self-pay | Admitting: Podiatry

## 2013-10-03 VITALS — BP 165/93 | HR 78 | Temp 98.0°F | Resp 15

## 2013-10-03 DIAGNOSIS — Z9889 Other specified postprocedural states: Secondary | ICD-10-CM

## 2013-10-03 NOTE — Patient Instructions (Signed)
Continue soaking in Epson salts until completely healed. Continue antibiotic ointment and a Band-Aid to completely healed. Monitor for any signs/symptoms of infection. Call the office immediately if any occur or go directly to the emergency room. Call with any questions/concerns.

## 2013-10-04 ENCOUNTER — Encounter: Payer: Self-pay | Admitting: Podiatry

## 2013-10-04 NOTE — Progress Notes (Signed)
Subjective:     Patient ID: Ethan Pitts, male   DOB: 19-Aug-1960, 53 y.o.   MRN: [id number]  HPI Mr. Hernon returns to the office today for follow up evaluation of right lateral hallux partial nail avulsion secondary to paronychia. States his pain is significantly improved compared to before the procedure. He was performing Epson salt soaks however he has discontinued it on the last couple days. He has not been covering the site with antibiotic ointment and Band-Aid. Denies any systemic complaints as fevers, chills, nausea, vomiting. No other complaints at this time to   Review of Systems  All other systems reviewed and are negative.      Objective:   Physical Exam AAO x3, NAD DP/PT pulses palpable 2/4 b/l. CRT < 3sec Protective sensation intact with Derrel Nip monofilament. Lateral border right hallux healed at this time. There is hyperkeratotic tissue within the nail border. There is no drainage, erythema, ascending cellulitis, pain to the nail border. No open lesions. No calf pain, swelling, warmth.     Assessment:     53 year old male status post right lateral hallux nail avulsion secondary to paronychia    Plan:     -Treatment options discussed including alternatives, risks, complications. -Some of the hyperkeratotic tissue was debrided distally. No underlying pockets of infection. -At this time the site appears to be healed. If he notices any drainage she should continue with Epsom salts soaks as well and about ointment and a Band-Aid. -Monitor for any signs or symptoms of infection and directed to call the office immediately if any are to occur or go directly to the emergency room  -Followup as needed. Call the office with any questions, concerns, change in symptoms.

## 2013-10-07 ENCOUNTER — Ambulatory Visit: Payer: BC Managed Care – PPO | Admitting: Endocrinology

## 2013-10-07 ENCOUNTER — Telehealth: Payer: Self-pay | Admitting: Endocrinology

## 2013-10-07 DIAGNOSIS — Z0289 Encounter for other administrative examinations: Secondary | ICD-10-CM

## 2013-10-07 NOTE — Telephone Encounter (Signed)
Patient no showed today's appt. Please advise on how to follow up. °A. No follow up necessary. °B. Follow up urgent. Contact patient immediately. °C. Follow up necessary. Contact patient and schedule visit in ___ days. °D. Follow up advised. Contact patient and schedule visit in ____weeks. ° °

## 2013-10-07 NOTE — Telephone Encounter (Signed)
Follow up advised. Contact patient and schedule visit in 2-4 weeks.

## 2013-10-08 NOTE — Telephone Encounter (Signed)
Lvom for pt to call back and reschedule appointment.

## 2013-10-17 ENCOUNTER — Encounter: Payer: Self-pay | Admitting: Internal Medicine

## 2013-11-16 ENCOUNTER — Other Ambulatory Visit: Payer: Self-pay | Admitting: Internal Medicine

## 2013-12-16 ENCOUNTER — Encounter: Payer: Self-pay | Admitting: Internal Medicine

## 2013-12-16 DIAGNOSIS — IMO0002 Reserved for concepts with insufficient information to code with codable children: Secondary | ICD-10-CM

## 2013-12-16 DIAGNOSIS — E1165 Type 2 diabetes mellitus with hyperglycemia: Secondary | ICD-10-CM

## 2013-12-16 MED ORDER — INSULIN PEN NEEDLE 31G X 5 MM MISC: Status: DC

## 2013-12-22 ENCOUNTER — Other Ambulatory Visit: Payer: Self-pay | Admitting: Internal Medicine

## 2014-01-02 DIAGNOSIS — M545 Low back pain, unspecified: Secondary | ICD-10-CM

## 2014-01-02 HISTORY — DX: Low back pain, unspecified: M54.50

## 2014-01-05 ENCOUNTER — Ambulatory Visit: Payer: BC Managed Care – PPO | Admitting: Pulmonary Disease

## 2014-01-08 ENCOUNTER — Other Ambulatory Visit: Payer: Self-pay

## 2014-01-08 MED ORDER — METFORMIN HCL ER 500 MG PO TB24: 500.0000 mg | ORAL_TABLET | Freq: Two times a day (BID) | ORAL | Status: DC

## 2014-01-31 ENCOUNTER — Emergency Department (HOSPITAL_BASED_OUTPATIENT_CLINIC_OR_DEPARTMENT_OTHER): Payer: BLUE CROSS/BLUE SHIELD

## 2014-01-31 ENCOUNTER — Encounter (HOSPITAL_BASED_OUTPATIENT_CLINIC_OR_DEPARTMENT_OTHER): Payer: Self-pay | Admitting: *Deleted

## 2014-01-31 ENCOUNTER — Emergency Department (HOSPITAL_BASED_OUTPATIENT_CLINIC_OR_DEPARTMENT_OTHER)
Admission: EM | Admit: 2014-01-31 | Discharge: 2014-01-31 | Disposition: A | Discharge status: Home / Self Care | Payer: BLUE CROSS/BLUE SHIELD | Attending: Emergency Medicine | Admitting: Emergency Medicine

## 2014-01-31 DIAGNOSIS — Z792 Long term (current) use of antibiotics: Secondary | ICD-10-CM | POA: Insufficient documentation

## 2014-01-31 DIAGNOSIS — F419 Anxiety disorder, unspecified: Secondary | ICD-10-CM | POA: Insufficient documentation

## 2014-01-31 DIAGNOSIS — I10 Essential (primary) hypertension: Secondary | ICD-10-CM | POA: Diagnosis not present

## 2014-01-31 DIAGNOSIS — M1990 Unspecified osteoarthritis, unspecified site: Secondary | ICD-10-CM | POA: Insufficient documentation

## 2014-01-31 DIAGNOSIS — M545 Low back pain: Secondary | ICD-10-CM | POA: Diagnosis present

## 2014-01-31 DIAGNOSIS — Z7982 Long term (current) use of aspirin: Secondary | ICD-10-CM | POA: Diagnosis not present

## 2014-01-31 DIAGNOSIS — Z794 Long term (current) use of insulin: Secondary | ICD-10-CM | POA: Insufficient documentation

## 2014-01-31 DIAGNOSIS — G4730 Sleep apnea, unspecified: Secondary | ICD-10-CM | POA: Diagnosis not present

## 2014-01-31 DIAGNOSIS — E785 Hyperlipidemia, unspecified: Secondary | ICD-10-CM | POA: Insufficient documentation

## 2014-01-31 DIAGNOSIS — K219 Gastro-esophageal reflux disease without esophagitis: Secondary | ICD-10-CM | POA: Insufficient documentation

## 2014-01-31 DIAGNOSIS — E119 Type 2 diabetes mellitus without complications: Secondary | ICD-10-CM | POA: Diagnosis not present

## 2014-01-31 DIAGNOSIS — M5432 Sciatica, left side: Secondary | ICD-10-CM | POA: Diagnosis not present

## 2014-01-31 DIAGNOSIS — Z79899 Other long term (current) drug therapy: Secondary | ICD-10-CM | POA: Diagnosis not present

## 2014-01-31 MED ORDER — PREDNISONE 50 MG PO TABS: 50.0000 mg | ORAL_TABLET | Freq: Every day | ORAL | Status: DC

## 2014-01-31 MED ORDER — OXYCODONE-ACETAMINOPHEN 5-325 MG PO TABS: 1.0000 | ORAL_TABLET | Freq: Four times a day (QID) | ORAL | Status: DC | PRN

## 2014-01-31 MED ORDER — CYCLOBENZAPRINE HCL 5 MG PO TABS: 5.0000 mg | ORAL_TABLET | Freq: Three times a day (TID) | ORAL | Status: DC | PRN

## 2014-01-31 NOTE — ED Provider Notes (Signed)
CSN: [id number]     Arrival date & time 01/31/14  0744 History   First MD Initiated Contact with Patient 01/31/14 [phone]     Chief Complaint  Patient presents with  . Back Pain     (Consider location/radiation/quality/duration/timing/severity/associated sxs/prior Treatment) HPI  Pt presenting with low back pain.  He states he was at work 2 days ago and bent forward- when he bent over he had acute onset of pain in left side of low back and numbness shooting down his left leg.  Since that time his left leg has had numbness.  No weakness of leg.  No retention of urine.  No incontinence of bowel or bladder.  No fever.  He has taken tylenol and ibuprofen without relief.  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  He has had back pain in the past, but has not had numnbess associated in the past. There are no other associated systemic symptoms, there are no other alleviating or modifying factors.   Past Medical History  Diagnosis Date  . Hyperlipidemia   . Hypertension   . Diabetes mellitus   . GERD (gastroesophageal reflux disease)   . Sleep apnea     cpap settings at 3  . Anxiety   . Arthritis     lower back    Past Surgical History  Procedure Laterality Date  . Tonsillectomy    . Adenoidectomy    . Breath tek h pylori N/A 01/08/2013    Procedure: BREATH TEK H PYLORI;  Surgeon: [name], MD;  Location: [facility];  Service: General;  Laterality: N/A;  . 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 Heart disease Father     valvular disease  . COPD Father   . Prostate cancer Father    History  Substance Use Topics  . Smoking status: Never Smoker   . Smokeless tobacco: Former Systems developer    Quit date: 02/06/2010  . Alcohol Use: 7.2 oz/week    12 Cans of beer per week   Comment:  12pk/per week    Review of Systems  ROS reviewed and all otherwise negative except for mentioned in HPI    Allergies  Review of patient's allergies indicates no known allergies.  Home Medications   Prior to Admission medications   Medication Sig Start Date End Date Taking? Authorizing Provider  ALPRAZolam Duanne Moron) 0.5 MG tablet Take 1 tablet (0.5 mg total) by mouth every 8 (eight) hours as needed for sleep or anxiety. 08/08/12   Hendricks Limes, MD  amLODipine (NORVASC) 5 MG tablet TAKE ONE TABLET BY MOUTH ONCE DAILY IN THE MORNING 11/17/13   Hendricks Limes, MD  aspirin EC 81 MG tablet Take 81 mg by mouth daily.    Historical Provider, MD  benazepril (LOTENSIN) 40 MG tablet Take 1 tablet (40 mg total) by mouth every morning. 07/09/13   Hendricks Limes, MD  carvedilol (COREG) 25 MG tablet  08/14/13   Historical Provider, MD  carvedilol (COREG) 25 MG tablet TAKE ONE TABLET BY MOUTH TWICE DAILY WITH  A  MEAL 12/22/13   Hendricks Limes, MD  cephALEXin (KEFLEX) 500 MG capsule Take 1 capsule (500 mg total) by mouth 2 (two) times daily. 09/11/13   Hendricks Limes, MD  cyclobenzaprine [facility])  5 MG tablet Take 1 tablet (5 mg total) by mouth 3 (three) times daily as needed for muscle spasms. 01/31/14   Threasa Beards, MD  glucose blood (BAYER CONTOUR NEXT TEST) test strip 1 each by Other route daily. And lancets qd 05/07/13   Renato Shin, MD  Insulin Pen Needle 31G X 5 MM MISC Use with Victoza as directed 12/16/13   Hendricks Limes, MD  ketoconazole (NIZORAL) 2 % cream Apply 1 application topically 2 (two) times daily. 09/11/13   Hendricks Limes, MD  Liraglutide (VICTOZA) 18 MG/3ML SOPN INJECT 0.6 MG INTO THE SKIN EVERY DAY 07/23/13   Renato Shin, MD  metFORMIN (GLUCOPHAGE-XR) 500 MG 24 hr tablet Take 1 tablet (500 mg total) by mouth 2 (two) times daily. *APPOINTMENT NEEDED FOR FURTHER REFILLS* 01/08/14   Renato Shin, MD  Omega-3 Fatty Acids (FISH OIL) 500 MG CAPS Take 1 capsule by mouth  2 (two) times daily.    Historical Provider, MD  omeprazole (PRILOSEC) 20 MG capsule Take 20 mg by mouth at bedtime.    Historical Provider, MD  oxyCODONE-acetaminophen (PERCOCET/ROXICET) 5-325 MG per tablet Take 1-2 tablets by mouth every 6 (six) hours as needed for severe pain. 01/31/14   Threasa Beards, MD  predniSONE (DELTASONE) 50 MG tablet Take 1 tablet (50 mg total) by mouth daily. 01/31/14   Threasa Beards, MD   BP 159/92 mmHg  Pulse 74  Temp(Src) 97.9 F (36.6 C) (Oral)  Resp 19  Ht 6\' 1"  (1.854 m)  Wt 270 lb (122.471 kg)  BMI 35.63 kg/m2  SpO2 99%  Vitals reviewed Physical Exam  Physical Examination: General appearance - alert, well appearing, and in no distress Mental status - alert, oriented to person, place, and time Eyes - no conjunctival injection, no scleral icterus Chest - clear to auscultation, no wheezes, rales or rhonchi, symmetric air entry Heart - normal rate, regular rhythm, normal S1, S2, no murmurs, rubs, clicks or gallops Back exam - midline ttp over lumbar spine, also some bilateral parapsinal tenderness to plapitaons Neurological - alert, oriented, normal speech, 5/5 strength in bilateral lower extremities, sensation intact to light touch Musculoskeletal - no joint tenderness, deformity or swelling Extremities - peripheral pulses normal, no pedal edema, no clubbing or cyanosis Skin - normal coloration and turgor, no rashes  ED Course  Procedures (including critical care time) Labs Review Labs Reviewed - No data to display  Imaging Review Dg Lumbar Spine Complete  01/31/2014   CLINICAL DATA:  Acute low back pain and left lower extremity numbness after bending injury 4 days ago. Initial encounter.  EXAM: LUMBAR SPINE - COMPLETE 4+ VIEW  COMPARISON:  None.  FINDINGS: No fracture is noted. Mild grade 1 retrolisthesis of L4-5 is noted secondary to mild degenerative disc disease at this level. Mild to moderate anterior osteophyte formation is noted at L1-2,  L2-3 and L3-4. Posterior facet joints appear normal.  IMPRESSION: Mild multilevel degenerative disc disease is noted. No acute abnormality seen in the lumbar spine.   Electronically Signed   By: Sabino Dick M.D.   On: 01/31/2014 08:52     EKG Interpretation None      MDM   Final diagnoses:  Sciatica neuralgia, left    Pt presenting with left lower back pain with radiation down left leg.  No signs or symptoms of cauda equina, no fever to suggest epidural abscess.  Xrays  Reassuring- performed due to midline pain, pt given rx for pain medication,  muscle relaxer, steroids.  F/u with neurosurgery and PMD.  Discharged with strict return precautions.  Pt agreeable with plan.    Threasa Beards, MD 01/31/14 [phone]

## 2014-01-31 NOTE — Discharge Instructions (Signed)
Return to the ED with any concerns including fever/chills, weakness of leg, not able to urinate, loss of control of bowel or bladder, decreased level of alertness/lethargy, or any other alarming symptoms

## 2014-01-31 NOTE — ED Notes (Signed)
Patient states when he was working Thursday, he felt a sharp pain in left lower back that was shooting down leg, continues to have back pain and left leg feels numb. Works as Museum/gallery curator patients. Took naproxen & ibuprofen but no relief

## 2014-02-18 ENCOUNTER — Encounter: Payer: Self-pay | Admitting: Pulmonary Disease

## 2014-02-26 ENCOUNTER — Encounter: Payer: Self-pay | Admitting: Nurse Practitioner

## 2014-02-26 ENCOUNTER — Ambulatory Visit (INDEPENDENT_AMBULATORY_CARE_PROVIDER_SITE_OTHER): Payer: BLUE CROSS/BLUE SHIELD | Admitting: Nurse Practitioner

## 2014-02-26 ENCOUNTER — Encounter: Payer: Self-pay | Admitting: Gastroenterology

## 2014-02-26 VITALS — Ht 72.0 in | Wt 274.0 lb

## 2014-02-26 DIAGNOSIS — E119 Type 2 diabetes mellitus without complications: Secondary | ICD-10-CM

## 2014-02-26 DIAGNOSIS — Z1321 Encounter for screening for nutritional disorder: Secondary | ICD-10-CM

## 2014-02-26 DIAGNOSIS — M545 Low back pain, unspecified: Secondary | ICD-10-CM | POA: Insufficient documentation

## 2014-02-26 DIAGNOSIS — Z6837 Body mass index (BMI) 37.0-37.9, adult: Secondary | ICD-10-CM

## 2014-02-26 DIAGNOSIS — Z0000 Encounter for general adult medical examination without abnormal findings: Secondary | ICD-10-CM

## 2014-02-26 DIAGNOSIS — M79605 Pain in left leg: Secondary | ICD-10-CM

## 2014-02-26 LAB — MICROALBUMIN / CREATININE URINE RATIO
CREATININE, U: 146.4 mg/dL
MICROALB UR: 1.1 mg/dL (ref 0.0–1.9)
Microalb Creat Ratio: 0.8 mg/g (ref 0.0–30.0)

## 2014-02-26 LAB — IRON AND TIBC
%SAT: 37 % (ref 20–55)
Iron: 151 ug/dL (ref 42–165)
TIBC: 411 ug/dL (ref 215–435)
UIBC: 260 ug/dL (ref 125–400)

## 2014-02-26 LAB — COMPREHENSIVE METABOLIC PANEL
ALBUMIN: 4.3 g/dL (ref 3.5–5.2)
ALT: 14 U/L (ref 0–53)
AST: 14 U/L (ref 0–37)
Alkaline Phosphatase: 76 U/L (ref 39–117)
BUN: 16 mg/dL (ref 6–23)
CALCIUM: 9.7 mg/dL (ref 8.4–10.5)
CHLORIDE: 104 meq/L (ref 96–112)
CO2: 32 meq/L (ref 19–32)
Creatinine, Ser: 0.82 mg/dL (ref 0.40–1.50)
GFR: 104.76 mL/min (ref >60.00)
GLUCOSE: 184 mg/dL — AB (ref 70–99)
POTASSIUM: 4.1 meq/L (ref 3.5–5.1)
SODIUM: 141 meq/L (ref 135–145)
TOTAL PROTEIN: 7.2 g/dL (ref 6.0–8.3)
Total Bilirubin: 0.6 mg/dL (ref 0.2–1.2)

## 2014-02-26 LAB — CBC WITH DIFFERENTIAL/PLATELET
Basophils Absolute: 0 10*3/uL (ref 0.0–0.1)
Basophils Relative: 0.5 % (ref 0.0–3.0)
EOS ABS: 0.2 10*3/uL (ref 0.0–0.7)
Eosinophils Relative: 4.3 % (ref 0.0–5.0)
HEMATOCRIT: 43.4 % (ref 39.0–52.0)
Hemoglobin: 15.1 g/dL (ref 13.0–17.0)
LYMPHS ABS: 1.9 10*3/uL (ref 0.7–4.0)
Lymphocytes Relative: 47.5 % — ABNORMAL HIGH (ref 12.0–46.0)
MCHC: 34.7 g/dL (ref 30.0–36.0)
MCV: 88.3 fl (ref 78.0–100.0)
MONOS PCT: 8.8 % (ref 3.0–12.0)
Monocytes Absolute: 0.4 10*3/uL (ref 0.1–1.0)
NEUTROS PCT: 38.9 % — AB (ref 43.0–77.0)
Neutro Abs: 1.6 10*3/uL (ref 1.4–7.7)
PLATELETS: 164 10*3/uL (ref 150.0–400.0)
RBC: 4.92 Mil/uL (ref 4.22–5.81)
RDW: 13.5 % (ref 11.5–15.5)
WBC: 4 10*3/uL (ref 4.0–10.5)

## 2014-02-26 LAB — VITAMIN D 25 HYDROXY (VIT D DEFICIENCY, FRACTURES): VITD: 28.37 ng/mL — AB (ref 30.00–100.00)

## 2014-02-26 LAB — VITAMIN B12: Vitamin B-12: 1107 pg/mL — ABNORMAL HIGH (ref 211–911)

## 2014-02-26 LAB — TSH: TSH: 0.5 u[IU]/mL (ref 0.35–4.50)

## 2014-02-26 LAB — T4, FREE: Free T4: 0.88 ng/dL (ref 0.60–1.60)

## 2014-02-26 LAB — HEMOGLOBIN A1C: HEMOGLOBIN A1C: 6.7 % — AB (ref 4.6–6.5)

## 2014-02-26 MED ORDER — PREDNISONE 10 MG PO TABS: ORAL_TABLET | ORAL | Status: DC

## 2014-02-26 NOTE — Progress Notes (Signed)
Pre visit review using our clinic review tool, if applicable. No additional management support is needed unless otherwise documented below in the visit note. 

## 2014-02-26 NOTE — Assessment & Plan Note (Signed)
10 day prednisone Back stretches Ref [facility]

## 2014-02-26 NOTE — Progress Notes (Signed)
Subjective:     Ethan Pitts is a 54 y.o. male presents w/c/o back pain with radiation to L buttock, leg & foot AND he has multiple gaps in care: Needs A1C, urine micro, lipids, coloscopy, flu & pneumonia vaccine. He is at risk for nutritional deficiencies due to Hx gastric bypass. Back: long Hx of chronic lumbar pain, likely caused by DDD. Acute episode started 12/25/13 when he was lifting a stretcher & almost dropped it causing a jerk to his back & sudden lumbar pain. He rested -had 10 days off work & felt better until 01/29/14 when he bent over & reached forward experiencing sudden onset of "electric shock" pain in low back that radiated to L leg 7/10. He went to ER 01/31/14. Note reviewed. Xrays reveal Mild multilevel degenerative disc disease. No fracture, mild grade 1 retrolisthesis of L4-5, Mild to mod anterior osteophyte formation is noted at L1-2, L2-3 and L3-4. Posterior facet joints appear normal. He was started on short pred taper, given percocet & told to f/u w/neuro. Pt f/u w/chiropractor instead. He has had 2-3 visits weekly for 4 weeks with improvement in pain 4-5/10. Radiculopathy is persistent. Foot was numb & painful few days ago while lying in bed. Percocet relieved pain. Denies weakness in leg.  DM: reviewed Dr Cordelia Pen note dated 07/2013: reduced victoza due to improved BS after gastric bypass. Pt was to f/u in 3 mos-he missed appt. Still taking metformin & victoza. Sleep apnea: sees Dr Gwenette Greet. Reviewed note dated 07/2013: no changes to CPAP. Pt was to f/u in 6 mos-no f/u. Preve care: discussed indication for pneumonia vaccine. Pt declines. Desires colonoscopy for colon ca screening. Gastric bypass: April 2015. Lost 40 lbs. Sees dietician/nutrition. Taking vitamin supplements. Not exercising. Wt loss has slowed. DM improved. Wife had gastric bypass also-lost 110 lbs.  The following portions of the patient's history were reviewed and updated as appropriate: allergies, current  medications, past family history, past medical history, past social history, past surgical history and problem list.  Review of Systems Constitutional: negative for fevers Eyes: negative for visual disturbance, dilated eye exam UTD Neurological: positive for gait problems, negative for weakness Behavioral/Psych: negative for sleep disturbance    Objective:    Ht 6' (1.829 m)  Wt 274 lb (124.286 kg)  BMI 37.15 kg/m2 General appearance: alert, cooperative, appears stated age, no distress and looks sleepy Head: Normocephalic, without obvious abnormality, atraumatic Eyes: negative findings: lids and lashes normal and pupils equal, round, reactive to light and accomodation, positive findings: conjunctiva: mildly injected Back: no tenderness to percussion or palpation, mild scoliosis-R shoulder & scap higher than L. LROM w/lateral flexion -worse on L, and forward flexion. Abdomen: obese Extremities: knees-no swelling or laxity  Skin: petechiae - lower leg(s) bilateral Neurologic: Alert and oriented X 3, normal strength and tone. Normal symmetric reflexes. Normal coordination and gait Reflexes: cannot illicit R patellar reflex, L is damp Gait: antalgic-slight limp L leg   Neg straight leg raise Assessment:Plan   1. Encounter for vitamin deficiency screening, chronic Secondary to gastric bypass - Vitamin B12 - Vit D  25 hydroxy (rtn osteoporosis monitoring) - Iron and TIBC  2. Type 2 diabetes mellitus without complication, improved Needs to f/u w/ Dr Loanne Drilling Continue metformin & victoza - Hemoglobin A1c - Comprehensive metabolic panel - CBC with Differential/Platelet - Microalbumin / creatinine urine ratio  3. Preventative health care Declines pna vaccine - HIV antibody - Ambulatory referral to Gastroenterology  4. BMI 37.0-37.9, adult Encourage daily exercise -  Hemoglobin A1c - TSH - T4, free - Comprehensive metabolic panel  5. Lumbar pain with radiation down left leg,  New Daily stretches NO NSAIDS while on prednisone No sweet foods or drinks - Ambulatory referral to Spine Surgery - predniSONE (DELTASONE) 10 MG tablet; Take 2Tpo qam X 5d, then 1T po qam X 5d.  Dispense: 15 tablet; Refill: 0  F/u 1 week-discuss lab results: lipids, HTN, DM, back

## 2014-02-26 NOTE — Patient Instructions (Addendum)
Please see spine specialist. Start prednisone. Take pain medicine if needed, DO NOT take IBUPROPHEN, ADVIL, MOTRIN, ALEVE. OK to take tylenol. Do not sit for long periods. Get up take a walk every hour.  Perform back stretches 2 or 3 times daily. Stretches should take 10 minutes.  Get colonoscopy done.  Return in 1 week to discuss labs and diabetes, lipids, and blood pressure management.  Consider getting pneumonia vaccine.  Sugars will run higher while on prednisone. So do not drink sweet drinks or eat sweet foods. Fresh fruit is OK.  See you next week.

## 2014-02-26 NOTE — Assessment & Plan Note (Signed)
Needs to sched appt w/Dr Loanne Drilling A1C, CMET, TSH, urine microalb today Declines pneumococcal vaccine

## 2014-02-27 LAB — HIV ANTIBODY (ROUTINE TESTING W REFLEX): HIV: NONREACTIVE

## 2014-03-04 ENCOUNTER — Ambulatory Visit: Payer: BLUE CROSS/BLUE SHIELD | Admitting: Nurse Practitioner

## 2014-03-05 ENCOUNTER — Encounter: Payer: Self-pay | Admitting: Nurse Practitioner

## 2014-03-05 ENCOUNTER — Ambulatory Visit (INDEPENDENT_AMBULATORY_CARE_PROVIDER_SITE_OTHER): Payer: BLUE CROSS/BLUE SHIELD | Admitting: Nurse Practitioner

## 2014-03-05 VITALS — BP 143/85 | HR 81 | Temp 98.2°F | Ht 72.0 in | Wt 274.0 lb

## 2014-03-05 DIAGNOSIS — E119 Type 2 diabetes mellitus without complications: Secondary | ICD-10-CM

## 2014-03-05 DIAGNOSIS — M79605 Pain in left leg: Secondary | ICD-10-CM

## 2014-03-05 DIAGNOSIS — G4700 Insomnia, unspecified: Secondary | ICD-10-CM | POA: Insufficient documentation

## 2014-03-05 DIAGNOSIS — M545 Low back pain, unspecified: Secondary | ICD-10-CM

## 2014-03-05 MED ORDER — ZALEPLON 5 MG PO CAPS: 5.0000 mg | ORAL_CAPSULE | Freq: Every evening | ORAL | Status: DC | PRN

## 2014-03-05 NOTE — Assessment & Plan Note (Signed)
No improvement w/prednisone & stretches Pt will see spine [facility] specialists today.

## 2014-03-05 NOTE — Assessment & Plan Note (Signed)
Last A1C 6.7 from 5.9 Stressed diet changes & exercise Detailed discussion about changes & handout given F/u with Dr Loanne Drilling.

## 2014-03-05 NOTE — Patient Instructions (Addendum)
It's up to you to reverse your diabetes.   You can do it with diet changes & exercise.  Refer to handout for best nutrition. No juice-eat the whole fruit. LIMIT or cut out alcoholic beverages-they are driving your sugar up & contributing to weight gain.  Walk for 30 minutes every day, even 10 minutes will help lower your blood sugar.  Please see Dr Loanne Drilling for ongoing management of diabetes.  You may take 1 to 2 tabs sonata at bedtime. Do not mix with alcohol.  See me in 1 month to review sonata.

## 2014-03-05 NOTE — Progress Notes (Signed)
Subjective:     Ethan Pitts is a 54 y.o. male presents to discuss recent elevation of A1C, & follow up back pain.  A1C: A1C normalized last spring after gastric bypass surgery. He has had recent wt gain due to making poor diet choices & not exercising. He drinks 3-4 glasses wine 2 or 3 nights/week to help him sleep & more on weekends when he goes camping. He drinks juice every day. We discussed diet changes & reversibility of DM if he makes healthy choices. He will stop drinking juice, limit ETOH, choose whole grains rather than refined grains & carbs. Back: no improvement w/prednisone & stretches. He will see [facility] [place] today. 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 He used sonata in past with good results. Ambien caused "grogginess". Often watches TV until goes to bed. Uses Cpap.  The following portions of the patient's history were reviewed and updated as appropriate: allergies, current medications, past medical history, past social history, past surgical history and problem list.  Review of Systems Constitutional: positive for fatigue Behavioral/Psych: positive for excessive alcohol consumption and sleep disturbance, negative for anxiety, depression and illegal drug usage    Objective:    BP 143/85 mmHg  Pulse 81  Temp(Src) 98.2 F (36.8 C) (Temporal)  Ht 6' (1.829 m)  Wt 274 lb (124.286 kg)  BMI 37.15 kg/m2  SpO2 95% BP 143/85 mmHg  Pulse 81  Temp(Src) 98.2 F (36.8 C) (Temporal)  Ht 6' (1.829 m)  Wt 274 lb (124.286 kg)  BMI 37.15 kg/m2  SpO2 95% General appearance: alert, cooperative, appears stated age and no distress Head: Normocephalic, without obvious abnormality, atraumatic Eyes: negative findings: lids and lashes normal and conjunctivae and sclerae normal Neurologic: Grossly normal    Assessment:Plan   1. Insomnia Discussed sleep hygiene - zaleplon (SONATA) 5 MG capsule; Take 1 capsule (5 mg total) by mouth at bedtime as  needed for sleep.  Dispense: 30 capsule; Refill: 1  2. Type 2 diabetes mellitus without complication Discussed diet changes & gave handout  3. Lumbar pain with radiation down left leg See [facility]  F/u 40mo-insomnia

## 2014-03-16 ENCOUNTER — Other Ambulatory Visit: Payer: Self-pay | Admitting: Rehabilitation

## 2014-03-16 DIAGNOSIS — M5416 Radiculopathy, lumbar region: Secondary | ICD-10-CM

## 2014-03-24 ENCOUNTER — Other Ambulatory Visit: Payer: Self-pay | Admitting: Rehabilitation

## 2014-03-24 DIAGNOSIS — M5416 Radiculopathy, lumbar region: Secondary | ICD-10-CM

## 2014-04-01 ENCOUNTER — Ambulatory Visit (AMBULATORY_SURGERY_CENTER): Payer: Self-pay

## 2014-04-01 VITALS — Ht 71.5 in | Wt 276.6 lb

## 2014-04-01 DIAGNOSIS — Z1211 Encounter for screening for malignant neoplasm of colon: Secondary | ICD-10-CM

## 2014-04-01 MED ORDER — MOVIPREP 100 G PO SOLR: 1.0000 | Freq: Once | ORAL | Status: DC

## 2014-04-01 NOTE — Progress Notes (Signed)
No allergies to eggs or soy No diet/weight loss meds No home oxygen No past problems with anesthesia  Has email  Emmi instructions given for colonoscopy 

## 2014-04-17 ENCOUNTER — Ambulatory Visit (AMBULATORY_SURGERY_CENTER): Payer: BLUE CROSS/BLUE SHIELD | Admitting: Gastroenterology

## 2014-04-17 ENCOUNTER — Encounter: Payer: Self-pay | Admitting: Gastroenterology

## 2014-04-17 VITALS — BP 139/95 | HR 64 | Temp 97.7°F | Resp 26 | Ht 71.5 in | Wt 276.0 lb

## 2014-04-17 DIAGNOSIS — K5730 Diverticulosis of large intestine without perforation or abscess without bleeding: Secondary | ICD-10-CM

## 2014-04-17 DIAGNOSIS — Z1211 Encounter for screening for malignant neoplasm of colon: Secondary | ICD-10-CM

## 2014-04-17 DIAGNOSIS — D129 Benign neoplasm of anus and anal canal: Secondary | ICD-10-CM

## 2014-04-17 DIAGNOSIS — D128 Benign neoplasm of rectum: Secondary | ICD-10-CM

## 2014-04-17 DIAGNOSIS — K621 Rectal polyp: Secondary | ICD-10-CM

## 2014-04-17 HISTORY — PX: COLONOSCOPY: SHX174

## 2014-04-17 MED ORDER — SODIUM CHLORIDE 0.9 % IV SOLN: 500.0000 mL | INTRAVENOUS | Status: DC

## 2014-04-17 NOTE — Progress Notes (Signed)
No problems noted in the recovery room. maw 

## 2014-04-17 NOTE — Progress Notes (Signed)
Called to room to assist during endoscopic procedure.  Patient ID and intended procedure confirmed with present staff. Received instructions for my participation in the procedure from the performing physician.  

## 2014-04-17 NOTE — Progress Notes (Signed)
To recovery, report to Willis, RN, VSS 

## 2014-04-17 NOTE — Patient Instructions (Signed)
YOU HAD AN ENDOSCOPIC PROCEDURE TODAY AT [facility]:   Refer to the procedure report that was given to you for any specific questions about what was found during the examination.  If the procedure report does not answer your questions, please call your gastroenterologist to clarify.  If you requested that your care partner not be given the details of your procedure findings, then the procedure report has been included in a sealed envelope for you to review at your convenience later.  YOU SHOULD EXPECT: Some feelings of bloating in the abdomen. Passage of more gas than usual.  Walking can help get rid of the air that was put into your GI tract during the procedure and reduce the bloating. If you had a lower endoscopy (such as a colonoscopy or flexible sigmoidoscopy) you may notice spotting of blood in your stool or on the toilet paper. If you underwent a bowel prep for your procedure, you may not have a normal bowel movement for a few days.  Please Note:  You might notice some irritation and congestion in your nose or some drainage.  This is from the oxygen used during your procedure.  There is no need for concern and it should clear up in a day or so.  SYMPTOMS TO REPORT IMMEDIATELY:   Following lower endoscopy (colonoscopy or flexible sigmoidoscopy):  Excessive amounts of blood in the stool  Significant tenderness or worsening of abdominal pains  Swelling of the abdomen that is new, acute  Fever of 100F or higher   For urgent or emergent issues, a gastroenterologist can be reached at any hour by calling [phone].   DIET: Your first meal following the procedure should be a small meal and then it is ok to progress to your normal diet. Heavy or fried foods are harder to digest and may make you feel nauseous or bloated.  Likewise, meals heavy in dairy and vegetables can increase bloating.  Drink plenty of fluids but you should avoid alcoholic beverages for 24  hours.  ACTIVITY:  You should plan to take it easy for the rest of today and you should NOT DRIVE or use heavy machinery until tomorrow (because of the sedation medicines used during the test).    FOLLOW UP: Our staff will call the number listed on your records the next business day following your procedure to check on you and address any questions or concerns that you may have regarding the information given to you following your procedure. If we do not reach you, we will leave a message.  However, if you are feeling well and you are not experiencing any problems, there is no need to return our call.  We will assume that you have returned to your regular daily activities without incident.  If any biopsies were taken you will be contacted by phone or by letter within the next 1-3 weeks.  Please call us at [phone] if you have not heard about the biopsies in 3 weeks.    SIGNATURES/CONFIDENTIALITY: You and/or your care partner have signed paperwork which will be entered into your electronic medical record.  These signatures attest to the fact that that the information above on your After Visit Summary has been reviewed and is understood.  Full responsibility of the confidentiality of this discharge information lies with you and/or your care-partner.    Your blood sugar was 136 in the recovery room. Handouts were given to your care partner on polyps, diverticulosis,and a high fiber  diet with liberal fluid intake. You might notice some irritation in your nose or drainage.  This may cause feelings of congestion.  This is from the oxygen, which can be drying.  This is no cause for concern; this should clear up in a few days.  You may resume your current medications today. Await biopsy results. Please call if any questions or concerns.

## 2014-04-17 NOTE — Op Note (Signed)
[place]  [address] [place], [phone]   COLONOSCOPY PROCEDURE REPORT  PATIENT: Ethan Pitts, Ethan Pitts  MR#: [id number] BIRTHDATE: 1960/10/26 , 43  yrs. old GENDER: male ENDOSCOPIST: Milus Banister, MD REFERRED XY:[id number] Melford Aase, M.D. PROCEDURE DATE:  04/17/2014 PROCEDURE:   Colonoscopy, screening and Colonoscopy with snare polypectomy First Screening Colonoscopy - Avg.  risk and is 50 yrs.  old or older Yes.  Prior Negative Screening - Now for repeat screening. N/A  History of Adenoma - Now for follow-up colonoscopy & has been > or = to 3 yrs.  N/A ASA CLASS:   Class II INDICATIONS:Screening for colonic neoplasia and Colorectal Neoplasm Risk Assessment for this procedure is average risk. MEDICATIONS: Monitored anesthesia care and Propofol 260 mg IV  DESCRIPTION OF PROCEDURE:   After the risks benefits and alternatives of the procedure were thoroughly explained, informed consent was obtained.  The digital rectal exam revealed no abnormalities of the rectum.   The LB [id number]  endoscope was introduced through the anus and advanced to the cecum, which was identified by both the appendix and ileocecal valve. No adverse events experienced.   The quality of the prep was good.  The instrument was then slowly withdrawn as the colon was fully examined.   COLON FINDINGS: A sessile polyp measuring 3 mm in size was found in the rectum.  A polypectomy was performed with a cold snare.  The resection was complete, the polyp tissue was completely retrieved and sent to histology.   There was mild diverticulosis noted in the left colon.   The examination was otherwise normal.  Retroflexed views revealed no abnormalities. The time to cecum = 2.1 Withdrawal time = 9.7   The scope was withdrawn and the procedure completed. COMPLICATIONS: There were no immediate complications.  ENDOSCOPIC IMPRESSION: 1.   Sessile polyp was found, removed and sent to  pathology 2.   There was mild diverticulosis noted in the left colon 3.   The examination was otherwise normal  RECOMMENDATIONS: If the polyp(s) removed today are proven to be adenomatous (pre-cancerous) polyps, you will need a repeat colonoscopy in 5 years.  Otherwise you should continue to follow colorectal cancer screening guidelines for "routine risk" patients with colonoscopy in 10 years.  You will receive a letter within 1-2 weeks with the results of your biopsy as well as final recommendations.  Please call my office if you have not received a letter after 3 weeks.  eSigned:  Milus Banister, MD 04/17/2014 11:26 AM

## 2014-04-20 ENCOUNTER — Telehealth: Payer: Self-pay | Admitting: *Deleted

## 2014-04-20 NOTE — Telephone Encounter (Signed)
  Follow up Call-  Call back number 04/17/2014  Post procedure Call Back phone  # [phone]  Permission to leave phone message Yes     Patient questions:  Do you have a fever, pain , or abdominal swelling? No. Pain Score  0 *  Have you tolerated food without any problems? Yes.    Have you been able to return to your normal activities? Yes.    Do you have any questions about your discharge instructions: Diet   No. Medications  No. Follow up visit  No.  Do you have questions or concerns about your Care? No.  Actions: * If pain score is 4 or above: No action needed, pain <4.

## 2014-04-27 ENCOUNTER — Encounter: Payer: Self-pay | Admitting: Gastroenterology

## 2014-05-17 ENCOUNTER — Other Ambulatory Visit: Payer: Self-pay | Admitting: Internal Medicine

## 2014-05-28 ENCOUNTER — Encounter: Payer: Self-pay | Admitting: Internal Medicine

## 2014-05-28 ENCOUNTER — Encounter: Payer: Self-pay | Admitting: Podiatry

## 2014-05-29 ENCOUNTER — Other Ambulatory Visit: Payer: Self-pay | Admitting: Nurse Practitioner

## 2014-05-29 ENCOUNTER — Encounter: Payer: Self-pay | Admitting: Nurse Practitioner

## 2014-05-29 DIAGNOSIS — I10 Essential (primary) hypertension: Secondary | ICD-10-CM

## 2014-05-29 MED ORDER — AMLODIPINE BESYLATE 5 MG PO TABS: 5.0000 mg | ORAL_TABLET | Freq: Every day | ORAL | Status: DC

## 2014-07-07 ENCOUNTER — Telehealth: Payer: Self-pay | Admitting: *Deleted

## 2014-07-07 ENCOUNTER — Other Ambulatory Visit: Payer: Self-pay | Admitting: Family Medicine

## 2014-07-07 DIAGNOSIS — I10 Essential (primary) hypertension: Secondary | ICD-10-CM

## 2014-07-07 MED ORDER — AMLODIPINE BESYLATE 5 MG PO TABS: 5.0000 mg | ORAL_TABLET | Freq: Every day | ORAL | Status: DC

## 2014-07-07 NOTE — Telephone Encounter (Signed)
Pt LMOM in regards to a medication? The message was difficult to hear/understand. Left message for pt to call back to clarify.

## 2014-07-07 NOTE — Telephone Encounter (Signed)
Pt requesting refill of amlodipine.  I spoke to pt and told him that he needed an OV per Layne's email to him on 05/29/14.  Rx sent for 30 days.  Pt to schedule as soon as possible. Pt scheduled his appointment with Dr. Anitra Lauth 07/22/14.

## 2014-07-08 LAB — HM DIABETES EYE EXAM

## 2014-07-09 ENCOUNTER — Encounter: Payer: Self-pay | Admitting: Endocrinology

## 2014-07-14 NOTE — Telephone Encounter (Signed)
Per pt this has already pt handled.

## 2014-07-22 ENCOUNTER — Ambulatory Visit (INDEPENDENT_AMBULATORY_CARE_PROVIDER_SITE_OTHER): Payer: BLUE CROSS/BLUE SHIELD | Admitting: Family Medicine

## 2014-07-22 ENCOUNTER — Encounter: Payer: Self-pay | Admitting: Family Medicine

## 2014-07-22 ENCOUNTER — Other Ambulatory Visit: Payer: Self-pay | Admitting: *Deleted

## 2014-07-22 VITALS — BP 143/88 | HR 67 | Temp 98.0°F | Resp 16 | Ht 72.0 in | Wt 281.0 lb

## 2014-07-22 DIAGNOSIS — I10 Essential (primary) hypertension: Secondary | ICD-10-CM

## 2014-07-22 DIAGNOSIS — E118 Type 2 diabetes mellitus with unspecified complications: Secondary | ICD-10-CM

## 2014-07-22 DIAGNOSIS — G4700 Insomnia, unspecified: Secondary | ICD-10-CM | POA: Diagnosis not present

## 2014-07-22 DIAGNOSIS — E119 Type 2 diabetes mellitus without complications: Secondary | ICD-10-CM | POA: Insufficient documentation

## 2014-07-22 DIAGNOSIS — F321 Major depressive disorder, single episode, moderate: Secondary | ICD-10-CM

## 2014-07-22 MED ORDER — ZALEPLON 5 MG PO CAPS
5.0000 mg | ORAL_CAPSULE | Freq: Every evening | ORAL | Status: DC | PRN
Start: 2014-07-22 — End: 2015-07-30

## 2014-07-22 MED ORDER — BENAZEPRIL HCL 40 MG PO TABS: 40.0000 mg | ORAL_TABLET | Freq: Every morning | ORAL | Status: DC

## 2014-07-22 MED ORDER — AMLODIPINE BESYLATE 5 MG PO TABS: 5.0000 mg | ORAL_TABLET | Freq: Every day | ORAL | Status: DC

## 2014-07-22 MED ORDER — METFORMIN HCL ER 500 MG PO TB24: ORAL_TABLET | ORAL | Status: DC

## 2014-07-22 MED ORDER — CITALOPRAM HYDROBROMIDE 20 MG PO TABS: 20.0000 mg | ORAL_TABLET | Freq: Every day | ORAL | Status: DC

## 2014-07-22 MED ORDER — ATORVASTATIN CALCIUM 40 MG PO TABS: 40.0000 mg | ORAL_TABLET | Freq: Every day | ORAL | Status: DC

## 2014-07-22 MED ORDER — CARVEDILOL 25 MG PO TABS: ORAL_TABLET | ORAL | Status: DC

## 2014-07-22 MED ORDER — LIRAGLUTIDE 18 MG/3ML ~~LOC~~ SOPN: PEN_INJECTOR | SUBCUTANEOUS | Status: DC

## 2014-07-22 NOTE — Progress Notes (Signed)
Pre visit review using our clinic review tool, if applicable. No additional management support is needed unless otherwise documented below in the visit note. 

## 2014-07-22 NOTE — Progress Notes (Signed)
OFFICE VISIT  07/22/2014   CC:  Chief Complaint  Patient presents with  . Follow-up    Medications f/u, pt is not fasting.   HPI:    Patient is a 54 y.o. Caucasian male who presents for f/u DM 2, HTN, hyperlipidemia.  Says he has felt depressed a lot lately (1.5 yrs, worse last 6 mo), tears up easily seemingly for no reason, having some marital difficulties but has only been married 9 mo, says his wife is "bipolar".  He has had mixed success in wt loss since gastric bipass surgery 15 mo ago.  Denies SI or HI.  Appetite is up, sleep is his "normal".   He is interested in med for depression and also wants to see counselor.  Has had low back pain with radiculopathy since 01/2014, [facility], went though PT. Radicular pain has resolved but says still has numbness feeling in R calf and foot.  Gabapentin has been unhelpful. He has f/u with Dr. Maia Petties 08/13/14.  Glucoses: recent fasting 119.  Victoza 1.2 qAM, metformin 500 mg bid. Not eating a very good diet, not exercising.  No home bp monitoring to report.  He is compliant with all bp meds.   Past Medical History  Diagnosis Date  . Hyperlipidemia   . Hypertension   . Diabetes mellitus   . GERD (gastroesophageal reflux disease)   . OSA on CPAP     cpap settings at 3  . Anxiety   . Arthritis     lower back     Past Surgical History  Procedure Laterality Date  . Tonsillectomy    . Adenoidectomy    . Breath tek h pylori N/A 01/08/2013    Procedure: BREATH TEK H PYLORI;  Surgeon: [name], MD;  Location: [facility];  Service: General;  Laterality: N/A;  . 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 ALPRAZolam (XANAX) 0.5 MG tablet Take 1 tablet (0.5 mg total) by mouth  every 8 (eight) hours as needed for sleep or anxiety. 30 tablet 0  . aspirin EC 81 MG tablet Take 81 mg by mouth daily.    Marland Kitchen glucose blood (BAYER CONTOUR NEXT TEST) test strip 1 each by Other route daily. And lancets qd 100 each 12  . Insulin Pen Needle 31G X 5 MM MISC Use with Victoza as directed 30 each 5  . Omega-3 Fatty Acids (FISH OIL) 500 MG CAPS Take 1 capsule by mouth 2 (two) times daily.    Marland Kitchen amLODipine (NORVASC) 5 MG tablet Take 1 tablet (5 mg total) by mouth daily. 30 tablet 0  . benazepril (LOTENSIN) 40 MG tablet Take 1 tablet (40 mg total) by mouth every morning. 90 tablet 3  . carvedilol (COREG) 25 MG tablet TAKE ONE TABLET BY MOUTH TWICE DAILY WITH  A  MEAL 180 tablet 1  . Liraglutide (VICTOZA) 18 MG/3ML SOPN INJECT 0.6 MG INTO THE SKIN EVERY DAY 9 pen 2  . metFORMIN (GLUCOPHAGE-XR) 500 MG 24 hr tablet Take 1 tablet (500 mg total) by mouth 2 (two) times daily. *APPOINTMENT NEEDED FOR FURTHER REFILLS* 180 tablet 0  . zaleplon (SONATA) 5 MG capsule Take 1 capsule (5 mg total) by mouth at bedtime as needed for sleep. [place]  capsule 1  . gabapentin (NEURONTIN) 600 MG tablet Take 600 mg by mouth 3 (three) times daily.     No facility-administered medications prior to visit.    No Known Allergies  ROS As per HPI  PE: Blood pressure 143/88, pulse 67, temperature 98 F (36.7 C), temperature source Oral, resp. rate 16, height 6' (1.829 m), weight 281 lb (127.461 kg), SpO2 92 %. Gen: Alert, well appearing.  Patient is oriented to person, place, time, and situation. Gets tearful occ during interview. Foot exam - bilateral normal; no swelling, tenderness or skin or vascular lesions. Color and temperature is normal. Sensation is intact. Peripheral pulses are palpable. Toenails are normal.  LABS:  Lab Results  Component Value Date   TSH 0.50 02/26/2014   Lab Results  Component Value Date   WBC 4.0 02/26/2014   HGB 15.1 02/26/2014   HCT 43.4 02/26/2014   MCV 88.3 02/26/2014   PLT  164.0 02/26/2014   Lab Results  Component Value Date   CREATININE 0.82 02/26/2014   BUN 16 02/26/2014   NA 141 02/26/2014   K 4.1 02/26/2014   CL 104 02/26/2014   CO2 32 02/26/2014   Lab Results  Component Value Date   ALT 14 02/26/2014   AST 14 02/26/2014   ALKPHOS 76 02/26/2014   BILITOT 0.6 02/26/2014   Lab Results  Component Value Date   CHOL 151 10/10/2012   Lab Results  Component Value Date   HDL 25.80* 10/10/2012   Lab Results  Component Value Date   LDLCALC 74 06/05/2011   Lab Results  Component Value Date   TRIG 238.0* 10/10/2012   Lab Results  Component Value Date   CHOLHDL 6 10/10/2012   Lab Results  Component Value Date   HGBA1C 6.7* 02/26/2014    IMPRESSION AND PLAN:  1) DM 2.  Control sounds fair although he doesn't report many home glucose measurements. HbA1c in 2d when fasting. Foot exam normal today. He declined pneumovax b/c he thinks he may have gotten it already through his employer. He'll try to check their records and we'll re-eval this next visit. Based on research data, diabetics have lower CV risk on statins, regardless of their cholesterol status, so pt was agreeable to starting atorvastatin 40mg  qd today.  FLP check in 2d when fasting.  2) Depression: start trial of citalopram 20mg  and refer to Doroteo Glassman, PhD. Therapeutic expectations and side effect profile of medication discussed today.  Patient's questions answered.  3) HTN; The current medical regimen is effective;  continue present plan and medications. Check BMET in 2 d when fasting.  4) Hx of LBP with radiculopathy pain: this has resolved but he is left with some residual L LL/foot numbness. I told him this should gradually resolve over time as well.  Will take gabapentin off of his med list since he is not taking this.  An After Visit Summary was printed and given to the patient.  FOLLOW UP: Return in about 4 weeks (around 08/19/2014) for f/u depression.

## 2014-07-23 ENCOUNTER — Encounter: Payer: Self-pay | Admitting: Family Medicine

## 2014-07-24 ENCOUNTER — Other Ambulatory Visit (INDEPENDENT_AMBULATORY_CARE_PROVIDER_SITE_OTHER): Payer: BLUE CROSS/BLUE SHIELD

## 2014-07-24 DIAGNOSIS — G4700 Insomnia, unspecified: Secondary | ICD-10-CM

## 2014-07-24 DIAGNOSIS — F321 Major depressive disorder, single episode, moderate: Secondary | ICD-10-CM

## 2014-07-24 DIAGNOSIS — I10 Essential (primary) hypertension: Secondary | ICD-10-CM | POA: Diagnosis not present

## 2014-07-24 DIAGNOSIS — E118 Type 2 diabetes mellitus with unspecified complications: Secondary | ICD-10-CM

## 2014-07-24 LAB — BASIC METABOLIC PANEL
BUN: 10 mg/dL (ref 6–23)
CHLORIDE: 102 meq/L (ref 96–112)
CO2: 30 mEq/L (ref 19–32)
CREATININE: 0.74 mg/dL (ref 0.40–1.50)
Calcium: 9.2 mg/dL (ref 8.4–10.5)
GFR: 117.75 mL/min (ref >60.00)
Glucose, Bld: 199 mg/dL — ABNORMAL HIGH (ref 70–99)
Potassium: 4 mEq/L (ref 3.5–5.1)
Sodium: 138 mEq/L (ref 135–145)

## 2014-07-24 LAB — LIPID PANEL
Cholesterol: 139 mg/dL (ref 0–200)
HDL: 29.6 mg/dL — ABNORMAL LOW (ref >39.00)
LDL Cholesterol: 71 mg/dL (ref 0–99)
NONHDL: 109.4
TRIGLYCERIDES: 190 mg/dL — AB (ref 0.0–149.0)
Total CHOL/HDL Ratio: 5
VLDL: 38 mg/dL (ref 0.0–40.0)

## 2014-07-24 LAB — HEMOGLOBIN A1C: Hgb A1c MFr Bld: 6.4 % (ref 4.6–6.5)

## 2014-08-10 ENCOUNTER — Encounter: Payer: Self-pay | Admitting: Family Medicine

## 2014-08-11 ENCOUNTER — Encounter: Payer: Self-pay | Admitting: Family Medicine

## 2014-08-11 ENCOUNTER — Other Ambulatory Visit: Payer: Self-pay | Admitting: Family Medicine

## 2014-08-11 MED ORDER — EXENATIDE 5 MCG/0.02ML ~~LOC~~ SOPN: 5.0000 ug | PEN_INJECTOR | Freq: Two times a day (BID) | SUBCUTANEOUS | Status: DC

## 2014-08-11 MED ORDER — PIOGLITAZONE HCL 15 MG PO TABS: 15.0000 mg | ORAL_TABLET | Freq: Every day | ORAL | Status: DC

## 2014-08-11 NOTE — Telephone Encounter (Signed)
OK, I just sent in generic actos.

## 2014-08-11 NOTE — Telephone Encounter (Signed)
Pt advised and voiced understanding.   

## 2014-08-11 NOTE — Telephone Encounter (Signed)
I sent in rx for Byetta injections to see if it is preferred on his insurance/might be cheaper.

## 2014-08-12 NOTE — Telephone Encounter (Signed)
Left detailed message on cell vm, okay per DPR.  

## 2014-08-19 ENCOUNTER — Ambulatory Visit: Payer: BLUE CROSS/BLUE SHIELD | Admitting: Family Medicine

## 2014-09-02 ENCOUNTER — Ambulatory Visit (INDEPENDENT_AMBULATORY_CARE_PROVIDER_SITE_OTHER): Payer: BLUE CROSS/BLUE SHIELD | Admitting: Family Medicine

## 2014-09-02 ENCOUNTER — Encounter: Payer: Self-pay | Admitting: Family Medicine

## 2014-09-02 VITALS — BP 147/89 | HR 73 | Temp 97.9°F | Resp 16 | Ht 72.0 in | Wt 283.0 lb

## 2014-09-02 DIAGNOSIS — F330 Major depressive disorder, recurrent, mild: Secondary | ICD-10-CM

## 2014-09-02 DIAGNOSIS — B354 Tinea corporis: Secondary | ICD-10-CM | POA: Diagnosis not present

## 2014-09-02 MED ORDER — PIOGLITAZONE HCL 15 MG PO TABS: 15.0000 mg | ORAL_TABLET | Freq: Every day | ORAL | Status: DC

## 2014-09-02 MED ORDER — CITALOPRAM HYDROBROMIDE 20 MG PO TABS: 20.0000 mg | ORAL_TABLET | Freq: Every day | ORAL | Status: DC

## 2014-09-02 NOTE — Progress Notes (Signed)
OFFICE VISIT  09/02/2014   CC:  Chief Complaint  Patient presents with  . Follow-up   HPI:    Patient is a 54 y.o. Caucasian male who presents for 1 mo f/u depression. Started citalopram 20mg  qd and referred pt to Doroteo Glassman, PhD when I last saw him. Doing better, mood improved, not crying easily anymore.   Feels 70% improved.  Not as irritable.  Saw Doroteo Glassman 2 times and has appt for 3rd visit and he finds this helpful.  Of note: pt cannot afford victoza or byetta, so we have started him on generic actos 15mg  daily since last visit. He is tolerating this med w/out problem and continues to take metformin.  Has rash on side and back x years, not using anything on it.  Not much itching.  Past Medical History  Diagnosis Date  . Hyperlipidemia   . Hypertension   . Diabetes mellitus   . GERD (gastroesophageal reflux disease)   . OSA on CPAP     cpap settings at 3  . Anxiety   . Arthritis     lower back   . Depression     Past Surgical History  Procedure Laterality Date  . Tonsillectomy    . Adenoidectomy    . Breath tek h pylori N/A 01/08/2013    Procedure: BREATH TEK H PYLORI;  Surgeon: [name], MD;  Location: [facility];  Service: General;  Laterality: N/A;  . 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 ALPRAZolam (XANAX) 0.5 MG tablet Take 1 tablet (0.5 mg total) by mouth every 8 (eight) hours as needed for sleep or anxiety. 30 tablet 0  . amLODipine (NORVASC) 5 MG tablet Take 1 tablet (5 mg total) by mouth daily. 90 tablet 3  . aspirin EC 81 MG tablet Take 81 mg by mouth daily.    Marland Kitchen atorvastatin (LIPITOR) 40 MG tablet Take 1 tablet (40 mg total) by mouth daily. 90 tablet 3  . benazepril (LOTENSIN) 40 MG tablet Take 1 tablet (40  mg total) by mouth every morning. 90 tablet 3  . carvedilol (COREG) 25 MG tablet TAKE ONE TABLET BY MOUTH TWICE DAILY WITH  A  MEAL 180 tablet 3  . glucose blood (BAYER CONTOUR NEXT TEST) test strip 1 each by Other route daily. And lancets qd 100 each 12  . Insulin Pen Needle 31G X 5 MM MISC Use with Victoza as directed 30 each 5  . metFORMIN (GLUCOPHAGE-XR) 500 MG 24 hr tablet Take one tablet two times daily 180 tablet 3  . Omega-3 Fatty Acids (FISH OIL) 500 MG CAPS Take 1 capsule by mouth 2 (two) times daily.    . zaleplon (SONATA) 5 MG capsule Take 1 capsule (5 mg total) by mouth at bedtime as needed for sleep. 90 capsule 0  . citalopram (CELEXA) 20 MG tablet Take 1 tablet (20 mg total) by mouth daily. 30 tablet 1  . exenatide (BYETTA 5 MCG PEN) 5 MCG/0.02ML SOPN injection Inject 0.02 mLs (5 mcg total) into the skin 2 (two) times daily with a meal. 6 mL 3  . Liraglutide (VICTOZA) 18 MG/3ML SOPN INJECT 0.6 MG INTO THE SKIN EVERY DAY (Patient not taking: Reported on 09/02/2014) 9 pen 3  . pioglitazone (ACTOS) 15  MG tablet Take 1 tablet (15 mg total) by mouth daily. (Patient not taking: Reported on 09/02/2014) 30 tablet 3   No facility-administered medications prior to visit.    No Known Allergies  ROS As per HPI  PE: Blood pressure 147/89, pulse 73, temperature 97.9 F (36.6 C), temperature source Oral, resp. rate 16, height 6' (1.829 m), weight 283 lb (128.368 kg), SpO2 95 %. Gen: Alert, well appearing.  Patient is oriented to person, place, time, and situation. AFFECT: pleasant, lucid thought and speech. SKIN: on his left truncal region and on his back he has a total of 3 large oval pinkish macular lesions with slight superficial flaking and serpiginous borders that are fairly well demarcated but not really raised.  Central clearing is very evident.  LABS:  none  IMPRESSION AND PLAN:  1) MDD, mild.  Improving nicely on SSRI x 1 mo + counseling. Continue both, re-evaluate in 3 mo to  see if dose increase is warranted. Discussed plan of minimum of 9-12 mo of antidepressant but may take longer if desired.  2) Tinea corperis: lamisil cream bid until rash gone.  An After Visit Summary was printed and given to the patient.  FOLLOW UP: Return in about 3 months (around 12/02/2014) for routine chronic illness f/u.

## 2014-09-02 NOTE — Patient Instructions (Signed)
Buy OTC generic lamisil (terbinafine) and apply to affected skin two times a day until rash resolves.

## 2014-09-02 NOTE — Progress Notes (Signed)
Pre visit review using our clinic review tool, if applicable. No additional management support is needed unless otherwise documented below in the visit note. 

## 2014-09-14 ENCOUNTER — Encounter: Payer: Self-pay | Admitting: Family Medicine

## 2014-12-02 ENCOUNTER — Ambulatory Visit: Payer: BLUE CROSS/BLUE SHIELD | Admitting: Family Medicine

## 2014-12-31 ENCOUNTER — Other Ambulatory Visit: Payer: Self-pay | Admitting: Family Medicine

## 2014-12-31 NOTE — Telephone Encounter (Signed)
RF request for pioglitazone LOV: 09/02/14 Next ov: None Last written: 08/05/14 #30 w/ 3RF

## 2015-02-03 ENCOUNTER — Ambulatory Visit (INDEPENDENT_AMBULATORY_CARE_PROVIDER_SITE_OTHER): Payer: BLUE CROSS/BLUE SHIELD | Admitting: Family Medicine

## 2015-02-03 ENCOUNTER — Encounter: Payer: Self-pay | Admitting: Family Medicine

## 2015-02-03 VITALS — BP 145/87 | HR 80 | Temp 98.8°F | Resp 16 | Ht 72.0 in | Wt 286.8 lb

## 2015-02-03 DIAGNOSIS — G5792 Unspecified mononeuropathy of left lower limb: Secondary | ICD-10-CM | POA: Diagnosis not present

## 2015-02-03 DIAGNOSIS — E119 Type 2 diabetes mellitus without complications: Secondary | ICD-10-CM

## 2015-02-03 DIAGNOSIS — M792 Neuralgia and neuritis, unspecified: Secondary | ICD-10-CM

## 2015-02-03 LAB — COMPREHENSIVE METABOLIC PANEL
ALBUMIN: 4.2 g/dL (ref 3.5–5.2)
ALT: 21 U/L (ref 0–53)
AST: 17 U/L (ref 0–37)
Alkaline Phosphatase: 83 U/L (ref 39–117)
BILIRUBIN TOTAL: 0.6 mg/dL (ref 0.2–1.2)
BUN: 15 mg/dL (ref 6–23)
CALCIUM: 9.6 mg/dL (ref 8.4–10.5)
CHLORIDE: 100 meq/L (ref 96–112)
CO2: 29 meq/L (ref 19–32)
CREATININE: 0.92 mg/dL (ref 0.40–1.50)
GFR: 91.41 mL/min (ref >60.00)
Glucose, Bld: 215 mg/dL — ABNORMAL HIGH (ref 70–99)
Potassium: 4.4 mEq/L (ref 3.5–5.1)
Sodium: 137 mEq/L (ref 135–145)
Total Protein: 7.2 g/dL (ref 6.0–8.3)

## 2015-02-03 LAB — HEMOGLOBIN A1C: Hgb A1c MFr Bld: 7.3 % — ABNORMAL HIGH (ref 4.6–6.5)

## 2015-02-03 MED ORDER — LIRAGLUTIDE 18 MG/3ML ~~LOC~~ SOPN: 1.2000 mg | PEN_INJECTOR | Freq: Every day | SUBCUTANEOUS | Status: DC

## 2015-02-03 MED ORDER — GABAPENTIN 300 MG PO CAPS: 300.0000 mg | ORAL_CAPSULE | Freq: Three times a day (TID) | ORAL | Status: DC

## 2015-02-03 NOTE — Progress Notes (Signed)
Pre visit review using our clinic review tool, if applicable. No additional management support is needed unless otherwise documented below in the visit note. 

## 2015-02-03 NOTE — Progress Notes (Signed)
OFFICE VISIT  02/03/2015   CC:  Chief Complaint  Patient presents with  . Foot Problem    numbness in left foot x 1 year    HPI:    Patient is a 55 y.o. Caucasian male who presents for about a 1 yr hx of numbness and throbbing/burning sensation in L foot.   Constant but worse in latter part of day.  Entire foot involved from ankle down to toes and plantar surface.  No change in color of foot to pale or blue or red.   He feels no sx's on R foot or leg.   These sx's occurred after a back injury 12/2013, after which he had L leg radiculopathy sx's + foot drop.  The back feels better and foot drop is better, but the L foot sx's remain. Sounds like he may have tried neurontin in the past for the L leg radiculopathy sx's-didn't work per his recollection.  Says lyrica too expensive.  Gluc 120s fasting.  He wants to try victoza again b/c he thinks he can get it cheaper now, wants to get back on this in place of the actos.  Past Medical History  Diagnosis Date  . Hyperlipidemia   . Hypertension   . Diabetes mellitus   . GERD (gastroesophageal reflux disease)   . OSA on CPAP     cpap settings at 3  . Anxiety   . Arthritis     lower back   . Depression   . Low back pain 2016    Left lumbar radiculopathy, lumbar spondylolisthesis    Past Surgical History  Procedure Laterality Date  . Tonsillectomy    . Adenoidectomy    . Breath tek h pylori N/A 01/08/2013    Procedure: BREATH TEK H PYLORI;  Surgeon: [name], MD;  Location: [facility];  Service: General;  Laterality: N/A;  . 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 ALPRAZolam (XANAX) 0.5 MG tablet Take 1 tablet (0.5 mg total) by mouth every 8 (eight) hours as needed for sleep or anxiety. 30 tablet  0  . amLODipine (NORVASC) 5 MG tablet Take 1 tablet (5 mg total) by mouth daily. 90 tablet 3  . aspirin EC 81 MG tablet Take 81 mg by mouth daily.    Marland Kitchen atorvastatin (LIPITOR) 40 MG tablet Take 1 tablet (40 mg total) by mouth daily. 90 tablet 3  . benazepril (LOTENSIN) 40 MG tablet Take 1 tablet (40 mg total) by mouth every morning. 90 tablet 3  . carvedilol (COREG) 25 MG tablet TAKE ONE TABLET BY MOUTH TWICE DAILY WITH  A  MEAL 180 tablet 3  . citalopram (CELEXA) 20 MG tablet Take 1 tablet (20 mg total) by mouth daily. 30 tablet 6  . glucose blood (BAYER CONTOUR NEXT TEST) test strip 1 each by Other route daily. And lancets qd 100 each 12  . Insulin Pen Needle 31G X 5 MM MISC Use with Victoza as directed 30 each 5  . metFORMIN (GLUCOPHAGE-XR) 500 MG 24 hr tablet Take one tablet two times daily 180 tablet 3  . Omega-3 Fatty Acids (FISH OIL) 500 MG CAPS Take 1 capsule by mouth 2 (two) times daily.    . zaleplon (SONATA) 5 MG capsule Take 1 capsule (5  mg total) by mouth at bedtime as needed for sleep. 90 capsule 0  . pioglitazone (ACTOS) 15 MG tablet TAKE ONE TABLET BY MOUTH ONCE DAILY 30 tablet 3  . pioglitazone (ACTOS) 15 MG tablet Take 1 tablet (15 mg total) by mouth daily. (Patient not taking: Reported on 02/03/2015) 30 tablet 3   No facility-administered medications prior to visit.    No Known Allergies  ROS As per HPI  PE: Blood pressure 145/87, pulse 80, temperature 98.8 F (37.1 C), temperature source Oral, resp. rate 16, height 6' (1.829 m), weight 286 lb 12 oz (130.069 kg), SpO2 95 %. Gen: Alert, well appearing.  Patient is oriented to person, place, time, and situation. Lower legs: no edema, rash, or discoloration. DP pulses 2+ bilat. Strength 5/5 prox/dist bilat. Sensory testing shows subtle decrease in fine touch sensation on L foot.   LABS:  Lab Results  Component Value Date   HGBA1C 6.4 07/24/2014     Chemistry      Component Value Date/Time   NA 138 07/24/2014 0857    K 4.0 07/24/2014 0857   CL 102 07/24/2014 0857   CO2 30 07/24/2014 0857   BUN 10 07/24/2014 0857   CREATININE 0.74 07/24/2014 0857   CREATININE 0.90 12/12/2012 1152      Component Value Date/Time   CALCIUM 9.2 07/24/2014 0857   ALKPHOS 76 02/26/2014 0946   AST 14 02/26/2014 0946   ALT 14 02/26/2014 0946   BILITOT 0.6 02/26/2014 0946     Lab Results  Component Value Date   CHOL 139 07/24/2014   HDL 29.60* 07/24/2014   LDLCALC 71 07/24/2014   LDLDIRECT 92.5 10/10/2012   TRIG 190.0* 07/24/2014   CHOLHDL 5 07/24/2014   Lab Results  Component Value Date   TSH 0.50 02/26/2014   Lab Results  Component Value Date   VITAMINB12 1107* 02/26/2014     IMPRESSION AND PLAN:  1) Left foot neuropathic pain, suspect residual spinal nerve damage from when 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 Diabetic PN less likely given the timing of onset of sx's plus DPN usually is a bilat/symmetric dz process. Will start trial of gabapentin: 300 mg qhs x 7d, then 300 mg bid x 7d, then 300 mg tid.  2) DM 2; per pt request (due to insurance coverage changes??): d/c actos, restart victoza 0.6mg  SQ x 1wk, then increase to 1.2mg  SQ qd.  Check HbA1c and CMET today.  An After Visit Summary was printed and given to the patient.  FOLLOW UP: Return in about 6 weeks (around 03/17/2015) for f/u L foot neuropathy.

## 2015-03-17 ENCOUNTER — Ambulatory Visit (INDEPENDENT_AMBULATORY_CARE_PROVIDER_SITE_OTHER): Payer: BLUE CROSS/BLUE SHIELD | Admitting: Family Medicine

## 2015-03-17 ENCOUNTER — Encounter: Payer: Self-pay | Admitting: Family Medicine

## 2015-03-17 VITALS — BP 146/84 | HR 73 | Temp 98.1°F | Resp 16 | Ht 72.0 in | Wt 289.8 lb

## 2015-03-17 DIAGNOSIS — E119 Type 2 diabetes mellitus without complications: Secondary | ICD-10-CM | POA: Diagnosis not present

## 2015-03-17 DIAGNOSIS — B354 Tinea corporis: Secondary | ICD-10-CM | POA: Diagnosis not present

## 2015-03-17 DIAGNOSIS — G5792 Unspecified mononeuropathy of left lower limb: Secondary | ICD-10-CM | POA: Diagnosis not present

## 2015-03-17 DIAGNOSIS — M792 Neuralgia and neuritis, unspecified: Secondary | ICD-10-CM

## 2015-03-17 MED ORDER — GABAPENTIN 600 MG PO TABS: ORAL_TABLET | ORAL | Status: DC

## 2015-03-17 NOTE — Patient Instructions (Signed)
Apply lamisil cream (over the counter) to the affected skin twice per day

## 2015-03-17 NOTE — Progress Notes (Signed)
Pre visit review using our clinic review tool, if applicable. No additional management support is needed unless otherwise documented below in the visit note. 

## 2015-03-17 NOTE — Progress Notes (Signed)
OFFICE VISIT  03/17/2015   CC:  Chief Complaint  Patient presents with  . Follow-up    left foot neuropathy  . Rash   HPI:    Patient is a 55 y.o. Caucasian male who presents for 6 week f/u L foot neuropathy. Last visit I started him on gabapentin.  He got to 300 bid dosing, says no improvement at all. No side effects.  He is open to titrating the dose upward.  Got back on victoza last visit and says he is doing well with this. Fasting glucose: not checked lately but last time he checked it was 134.  No checks later in the day to report. Reviewed last A1c with pt again today.  Also reports rash: apparently longstanding /recurrent in different parts of the body (trunk), barely visible pinkish rash with well demarcated borders and slightly flakiness to skine.  The borders are more visible than the rash within the borders.  Some itchiness sometimes.  Says this rash was in his lower abd pannus at one point.  Past Medical History  Diagnosis Date  . Hyperlipidemia   . Hypertension   . Diabetes mellitus   . GERD (gastroesophageal reflux disease)   . OSA on CPAP     cpap settings at 3  . Anxiety   . Arthritis     lower back   . Depression   . Low back pain 2016    Left lumbar radiculopathy, lumbar spondylolisthesis    Past Surgical History  Procedure Laterality Date  . Tonsillectomy    . Adenoidectomy    . Breath tek h pylori N/A 01/08/2013    Procedure: BREATH TEK H PYLORI;  Surgeon: [name], MD;  Location: [facility];  Service: General;  Laterality: N/A;  . 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 ALPRAZolam (XANAX) 0.5 MG tablet Take 1 tablet (0.5 mg total) by mouth every 8 (eight) hours as needed for sleep or anxiety. 30 tablet 0  .  amLODipine (NORVASC) 5 MG tablet Take 1 tablet (5 mg total) by mouth daily. 90 tablet 3  . aspirin EC 81 MG tablet Take 81 mg by mouth daily.    Marland Kitchen atorvastatin (LIPITOR) 40 MG tablet Take 1 tablet (40 mg total) by mouth daily. 90 tablet 3  . benazepril (LOTENSIN) 40 MG tablet Take 1 tablet (40 mg total) by mouth every morning. 90 tablet 3  . carvedilol (COREG) 25 MG tablet TAKE ONE TABLET BY MOUTH TWICE DAILY WITH  A  MEAL 180 tablet 3  . citalopram (CELEXA) 20 MG tablet Take 1 tablet (20 mg total) by mouth daily. 30 tablet 6  . glucose blood (BAYER CONTOUR NEXT TEST) test strip 1 each by Other route daily. And lancets qd 100 each 12  . Insulin Pen Needle 31G X 5 MM MISC Use with Victoza as directed 30 each 5  . Liraglutide (VICTOZA) 18 MG/3ML SOPN Inject 0.2 mLs (1.2 mg total) into the skin daily. 6 mL 12  . metFORMIN (GLUCOPHAGE-XR) 500 MG 24 hr tablet Take one tablet two times daily 180 tablet 3  . Omega-3 Fatty Acids (FISH OIL) 500 MG CAPS Take 1 capsule by mouth 2 (two) times daily.    . zaleplon (SONATA) 5 MG capsule Take 1  capsule (5 mg total) by mouth at bedtime as needed for sleep. 90 capsule 0  . gabapentin (NEURONTIN) 300 MG capsule Take 1 capsule (300 mg total) by mouth 3 (three) times daily. 90 capsule 3   No facility-administered medications prior to visit.    No Known Allergies  ROS As per HPI  PE: Blood pressure 146/84, pulse 73, temperature 98.1 F (36.7 C), temperature source Oral, resp. rate 16, height 6' (1.829 m), weight 289 lb 12 oz (131.43 kg), SpO2 94 %. Gen: Alert, well appearing.  Patient is oriented to person, place, time, and situation. Left lower leg with trace pitting edema.  Normal temperature.  DP and PT pulses +. No skin discoloration or breakdown.  ROM of ankle/foot/toes all normal.  Strength 5/5.  No muscle atrophy. SKIN: Left shoulder and upper arm as well as L side of upper and mid back has a barely visible, light pink macular rash with scattered  superficial flaking and well demarcated pinkish borders.  LABS:    Chemistry      Component Value Date/Time   NA 137 02/03/2015 0929   K 4.4 02/03/2015 0929   CL 100 02/03/2015 0929   CO2 29 02/03/2015 0929   BUN 15 02/03/2015 0929   CREATININE 0.92 02/03/2015 0929   CREATININE 0.90 12/12/2012 1152      Component Value Date/Time   CALCIUM 9.6 02/03/2015 0929   ALKPHOS 83 02/03/2015 0929   AST 17 02/03/2015 0929   ALT 21 02/03/2015 0929   BILITOT 0.6 02/03/2015 0929     Lab Results  Component Value Date   WBC 4.0 02/26/2014   HGB 15.1 02/26/2014   HCT 43.4 02/26/2014   MCV 88.3 02/26/2014   PLT 164.0 02/26/2014   Lab Results  Component Value Date   TSH 0.50 02/26/2014   Lab Results  Component Value Date   VITAMINB12 1107* 02/26/2014   Lab Results  Component Value Date   HGBA1C 7.3* 02/03/2015    IMPRESSION AND PLAN:  1) Left foot painful peripheral neuropathy: no improvement with low dose neurontin.  No adverse side effect from this med.  Will titrate dose slowly to get to max of 1200 mg tid---rx sent today.  Therapeutic expectations and side effect profile of medication discussed today.  Patient's questions answered. If no improvement with this or if med becomes intolerable, will do trial of tricyclic vs ask pain mgmt to see him.  2) Tinea corporis:  L shoulder, upper arm, and L side of back.  Instructions: Apply lamisil cream (over the counter) to the affected skin twice per day  3) DM 2; tolerating current treatments well.  Needs to get more compliant with monitoring at home. Control has been pretty good/stable. Plan recheck HbA1c at next f/u in 6 weeks.  An After Visit Summary was printed and given to the patient.  FOLLOW UP: Return in about 6 weeks (around 04/28/2015) for routine chronic illness f/u (30 min).

## 2015-04-28 ENCOUNTER — Ambulatory Visit (INDEPENDENT_AMBULATORY_CARE_PROVIDER_SITE_OTHER): Payer: BLUE CROSS/BLUE SHIELD | Admitting: Family Medicine

## 2015-04-28 ENCOUNTER — Encounter: Payer: Self-pay | Admitting: Family Medicine

## 2015-04-28 VITALS — BP 139/77 | HR 84 | Temp 98.0°F | Resp 16 | Ht 72.0 in | Wt 291.0 lb

## 2015-04-28 DIAGNOSIS — I10 Essential (primary) hypertension: Secondary | ICD-10-CM

## 2015-04-28 DIAGNOSIS — M79605 Pain in left leg: Secondary | ICD-10-CM

## 2015-04-28 DIAGNOSIS — Z125 Encounter for screening for malignant neoplasm of prostate: Secondary | ICD-10-CM

## 2015-04-28 DIAGNOSIS — M545 Low back pain: Secondary | ICD-10-CM | POA: Diagnosis not present

## 2015-04-28 DIAGNOSIS — E785 Hyperlipidemia, unspecified: Secondary | ICD-10-CM

## 2015-04-28 DIAGNOSIS — E119 Type 2 diabetes mellitus without complications: Secondary | ICD-10-CM | POA: Diagnosis not present

## 2015-04-28 DIAGNOSIS — R6882 Decreased libido: Secondary | ICD-10-CM

## 2015-04-28 DIAGNOSIS — Z1159 Encounter for screening for other viral diseases: Secondary | ICD-10-CM

## 2015-04-28 LAB — PSA: PSA: 1.17 ng/mL (ref 0.10–4.00)

## 2015-04-28 LAB — HEMOGLOBIN A1C: Hgb A1c MFr Bld: 7.5 % — ABNORMAL HIGH (ref 4.6–6.5)

## 2015-04-28 MED ORDER — AMITRIPTYLINE HCL 25 MG PO TABS: 25.0000 mg | ORAL_TABLET | Freq: Every day | ORAL | Status: DC

## 2015-04-28 NOTE — Progress Notes (Signed)
OFFICE VISIT  04/28/2015   CC:  Chief Complaint  Patient presents with  . Follow-up    Pt is not fasting.    HPI:    Patient is a 55 y.o. Caucasian male who presents for f/u DM, HTN, HLD, and neuropathic pain of left foot. Six weeks ago we attempted to titrate up his gabapentin dosing to get relief for his L foot neuropathic pain. He made it to 600 mg tid dosing.  Has good days and bad days: busy/active days are worse.  No adverse side effects. Overall he feels like the med is helping some but not GREAT.  Glucoses: 130-140 range fasting.  Occ 170s range if he eats wrong. Continues to be compliant with bp and cholest meds w/out side effect.  Says home bp checks are normal.  Says he has no libido, doesn't think about sex unless wife initiates it.  Feels fatigued/run down a lot.     Past Medical History  Diagnosis Date  . Hyperlipidemia   . Hypertension   . Diabetes mellitus   . GERD (gastroesophageal reflux disease)   . OSA on CPAP     cpap settings at 3  . Anxiety   . Arthritis     lower back   . Depression   . Low back pain 2016    Left lumbar radiculopathy, lumbar spondylolisthesis  . Neuropathic pain of left foot     Past Surgical History  Procedure Laterality Date  . Tonsillectomy    . Adenoidectomy    . Breath tek h pylori N/A 01/08/2013    Procedure: BREATH TEK H PYLORI;  Surgeon: [name], MD;  Location: [facility];  Service: General;  Laterality: N/A;  . 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 ALPRAZolam (XANAX) 0.5 MG tablet Take 1 tablet (0.5 mg total) by mouth every 8 (eight) hours as needed for sleep or anxiety. 30 tablet 0  . amLODipine (NORVASC) 5 MG tablet Take 1 tablet (5 mg total) by mouth daily. 90 tablet 3  .  aspirin EC 81 MG tablet Take 81 mg by mouth daily.    Marland Kitchen atorvastatin (LIPITOR) 40 MG tablet Take 1 tablet (40 mg total) by mouth daily. 90 tablet 3  . benazepril (LOTENSIN) 40 MG tablet Take 1 tablet (40 mg total) by mouth every morning. 90 tablet 3  . carvedilol (COREG) 25 MG tablet TAKE ONE TABLET BY MOUTH TWICE DAILY WITH  A  MEAL 180 tablet 3  . citalopram (CELEXA) 20 MG tablet Take 1 tablet (20 mg total) by mouth daily. 30 tablet 6  . gabapentin (NEURONTIN) 600 MG tablet 1-2 tabs po tid for left foot neuropathic pain 180 tablet 1  . glucose blood (BAYER CONTOUR NEXT TEST) test strip 1 each by Other route daily. And lancets qd 100 each 12  . Insulin Pen Needle 31G X 5 MM MISC Use with Victoza as directed 30 each 5  . Liraglutide (VICTOZA) 18 MG/3ML SOPN Inject 0.2 mLs (1.2 mg total) into the skin daily. 6 mL 12  . metFORMIN (GLUCOPHAGE-XR) 500 MG 24 hr tablet Take one tablet two times daily 180 tablet 3  . Omega-3 Fatty Acids (FISH OIL) 500 MG CAPS Take 1 capsule by mouth 2 (two) times daily.    Marland Kitchen  zaleplon (SONATA) 5 MG capsule Take 1 capsule (5 mg total) by mouth at bedtime as needed for sleep. 90 capsule 0   No facility-administered medications prior to visit.    No Known Allergies  ROS As per HPI  PE: Blood pressure 139/77, pulse 84, temperature 98 F (36.7 C), temperature source Oral, resp. rate 16, height 6' (1.829 m), weight 291 lb (131.997 kg), SpO2 93 %.  Gen: Alert, well appearing.  Patient is oriented to person, place, time, and situation. AFFECT: pleasant, lucid thought and speech. Rectal exam: negative without mass, lesions or tenderness, PROSTATE EXAM: smooth and symmetric without nodules or tenderness.  LABS:  Lab Results  Component Value Date   TSH 0.50 02/26/2014   Lab Results  Component Value Date   WBC 4.0 02/26/2014   HGB 15.1 02/26/2014   HCT 43.4 02/26/2014   MCV 88.3 02/26/2014   PLT 164.0 02/26/2014   Lab Results  Component Value Date   CREATININE  0.92 02/03/2015   BUN 15 02/03/2015   NA 137 02/03/2015   K 4.4 02/03/2015   CL 100 02/03/2015   CO2 29 02/03/2015   Lab Results  Component Value Date   ALT 21 02/03/2015   AST 17 02/03/2015   ALKPHOS 83 02/03/2015   BILITOT 0.6 02/03/2015   Lab Results  Component Value Date   CHOL 139 07/24/2014   Lab Results  Component Value Date   HDL 29.60* 07/24/2014   Lab Results  Component Value Date   LDLCALC 71 07/24/2014   Lab Results  Component Value Date   TRIG 190.0* 07/24/2014   Lab Results  Component Value Date   CHOLHDL 5 07/24/2014   Lab Results  Component Value Date   HGBA1C 7.3* 02/03/2015   Lab Results  Component Value Date   VITAMINB12 1107* 02/26/2014   IMPRESSION AND PLAN:  1) L foot neuropathic pain: mild/mod improvement on 600mg  gabapentin tid for the last 6 wks. Will add amitriptyline 25mg  qhs today.  Therapeutic expectations and side effect profile of medication discussed today.  Patient's questions answered. If pt not signif improved/satisfied with effect of this regimen when I see him back in 2 mo then we will reconsider trial of lyrica since he has new insurer.  2) DM 2; control ok. HbA1c today.  3) HTN; The current medical regimen is effective;  continue present plan and medications. Lytes/cr 3 mo ago were good.   4) HLD: Not fasting today, so will repeat FLP at future visit.  AST/ALT good about 3 mo ago.  5) Decreased libido: will go ahead and check testosterone level.  If low, I told pt we would repeat it with an earlier morning blood draw.    6) Prostate ca screening: DRE normal today.  PSA drawn.  7) Preventative health care: Hep C screening today.  An After Visit Summary was printed and given to the patient.  FOLLOW UP: Return in about 2 months (around 06/28/2015) for f/u L foot neuropathic pain.  Signed:  Crissie Sickles, MD           04/28/2015

## 2015-04-28 NOTE — Progress Notes (Signed)
Pre visit review using our clinic review tool, if applicable. No additional management support is needed unless otherwise documented below in the visit note. 

## 2015-04-29 LAB — TESTOSTERONE TOTAL,FREE,BIO, MALES
ALBUMIN: 4.4 g/dL (ref 3.6–5.1)
SEX HORMONE BINDING: 25 nmol/L (ref 10–50)
TESTOSTERONE BIOAVAILABLE: 51.5 ng/dL — AB (ref 130.5–681.7)
TESTOSTERONE FREE: 25.6 pg/mL — AB (ref 47.0–244.0)
TESTOSTERONE: 169 ng/dL — AB (ref 250–827)

## 2015-04-29 LAB — HEPATITIS C ANTIBODY: HCV AB: NEGATIVE

## 2015-04-30 ENCOUNTER — Other Ambulatory Visit: Payer: Self-pay | Admitting: Family Medicine

## 2015-04-30 DIAGNOSIS — R6882 Decreased libido: Secondary | ICD-10-CM

## 2015-04-30 DIAGNOSIS — R7989 Other specified abnormal findings of blood chemistry: Secondary | ICD-10-CM

## 2015-05-03 DIAGNOSIS — E291 Testicular hypofunction: Secondary | ICD-10-CM | POA: Insufficient documentation

## 2015-05-03 HISTORY — DX: Testicular hypofunction: E29.1

## 2015-05-25 ENCOUNTER — Other Ambulatory Visit (INDEPENDENT_AMBULATORY_CARE_PROVIDER_SITE_OTHER): Payer: BLUE CROSS/BLUE SHIELD

## 2015-05-25 DIAGNOSIS — R7989 Other specified abnormal findings of blood chemistry: Secondary | ICD-10-CM

## 2015-05-25 DIAGNOSIS — R6882 Decreased libido: Secondary | ICD-10-CM

## 2015-05-25 DIAGNOSIS — E291 Testicular hypofunction: Secondary | ICD-10-CM | POA: Diagnosis not present

## 2015-05-25 LAB — LUTEINIZING HORMONE: LH: 5.58 m[IU]/mL (ref 1.50–9.30)

## 2015-05-25 LAB — TSH: TSH: 0.46 u[IU]/mL (ref 0.35–4.50)

## 2015-05-26 LAB — TESTOSTERONE TOTAL,FREE,BIO, MALES
Albumin: 4.1 g/dL (ref 3.6–5.1)
SEX HORMONE BINDING: 25 nmol/L (ref 10–50)
Testosterone, Bioavailable: 50.7 ng/dL — ABNORMAL LOW (ref 130.5–681.7)
Testosterone, Free: 27 pg/mL — ABNORMAL LOW (ref 47.0–244.0)
Testosterone: 174 ng/dL — ABNORMAL LOW (ref 250–827)

## 2015-05-26 LAB — PROLACTIN: PROLACTIN: 7.3 ng/mL (ref 2.0–18.0)

## 2015-05-27 ENCOUNTER — Other Ambulatory Visit: Payer: Self-pay | Admitting: Family Medicine

## 2015-05-27 ENCOUNTER — Encounter: Payer: Self-pay | Admitting: Family Medicine

## 2015-05-27 DIAGNOSIS — E291 Testicular hypofunction: Secondary | ICD-10-CM

## 2015-05-27 MED ORDER — TESTOSTERONE 20.25 MG/1.25GM (1.62%) TD GEL: TRANSDERMAL | Status: DC

## 2015-05-28 ENCOUNTER — Other Ambulatory Visit: Payer: Self-pay | Admitting: Family Medicine

## 2015-05-28 ENCOUNTER — Telehealth: Payer: Self-pay | Admitting: *Deleted

## 2015-05-28 MED ORDER — TESTOSTERONE 20.25 MG/1.25GM (1.62%) TD GEL: TRANSDERMAL | Status: DC

## 2015-05-28 NOTE — Telephone Encounter (Signed)
Changed rx to " apply 1 pump to each shoulder daily"

## 2015-05-28 NOTE — Telephone Encounter (Signed)
Morine from [place] called needing to clarify the sig for pts androgel. She stated with the way the sig is written it would have pt doing 8 pumps a day. Please advise. Thanks.

## 2015-06-01 ENCOUNTER — Encounter: Payer: Self-pay | Admitting: Family Medicine

## 2015-06-02 NOTE — Telephone Encounter (Signed)
Pharmacy advised and voiced understanding on 05/28/15.

## 2015-06-09 ENCOUNTER — Other Ambulatory Visit: Payer: Self-pay | Admitting: Family Medicine

## 2015-06-15 ENCOUNTER — Encounter: Payer: Self-pay | Admitting: Family Medicine

## 2015-06-15 NOTE — Telephone Encounter (Signed)
Pt advised that he can come in on Friday 06/18/15 to have lab drawn but needs to be sure to apply get 4-5 hours before he has his lab drawn. Pt voiced understanding. Lab apt made for 2:00pm on 06/18/15.

## 2015-06-18 ENCOUNTER — Other Ambulatory Visit: Payer: BLUE CROSS/BLUE SHIELD

## 2015-06-18 DIAGNOSIS — E291 Testicular hypofunction: Secondary | ICD-10-CM

## 2015-06-19 LAB — TESTOSTERONE: TESTOSTERONE: 248 ng/dL — AB (ref 250–827)

## 2015-06-30 ENCOUNTER — Encounter: Payer: Self-pay | Admitting: Family Medicine

## 2015-06-30 ENCOUNTER — Ambulatory Visit (INDEPENDENT_AMBULATORY_CARE_PROVIDER_SITE_OTHER): Payer: BLUE CROSS/BLUE SHIELD | Admitting: Family Medicine

## 2015-06-30 VITALS — BP 145/88 | HR 76 | Temp 97.8°F | Resp 16 | Ht 72.0 in | Wt 288.5 lb

## 2015-06-30 DIAGNOSIS — M545 Low back pain: Secondary | ICD-10-CM

## 2015-06-30 DIAGNOSIS — M79605 Pain in left leg: Principal | ICD-10-CM

## 2015-06-30 MED ORDER — PREGABALIN 75 MG PO CAPS: ORAL_CAPSULE | ORAL | Status: DC

## 2015-06-30 NOTE — Progress Notes (Signed)
OFFICE VISIT  06/30/2015   CC:  Chief Complaint  Patient presents with  . Follow-up    left foot neuropathic pain   HPI:    Patient is a 55 y.o. Caucasian male who presents for 2 mo f/u L foot neuropathic pain. I added 25mg  of amitriptyline hs last visit and kept him on same dosing of his gabapentin (600mg  tid). He couldn't tolerate the sedative effect of amitrip so he stopped it after just a few days.  He recalls a 5 day free sample of lyric from neurosurgery office in the past helped much better than the gabapentin currently does.   Past Medical History  Diagnosis Date  . Hyperlipidemia   . Hypertension   . Diabetes mellitus   . GERD (gastroesophageal reflux disease)   . OSA on CPAP     cpap settings at 3  . Anxiety   . Arthritis     lower back   . Depression   . Low back pain 2016    Left lumbar radiculopathy, lumbar spondylolisthesis  . Neuropathic pain of left foot   . Hypogonadism, male 05/2015    Past Surgical History  Procedure Laterality Date  . Tonsillectomy    . Adenoidectomy    . Breath tek h pylori N/A 01/08/2013    Procedure: BREATH TEK H PYLORI;  Surgeon: [name], MD;  Location: [facility];  Service: General;  Laterality: N/A;  . 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 ALPRAZolam (XANAX) 0.5 MG tablet Take 1 tablet (0.5 mg total) by mouth every 8 (eight) hours as needed for sleep or anxiety. 30 tablet 0  . amitriptyline (ELAVIL) 25 MG tablet Take 1 tablet (25 mg total) by mouth at bedtime. 30 tablet 2  . amLODipine (NORVASC) 5 MG tablet Take 1 tablet (5 mg total) by mouth daily. 90 tablet 3  . aspirin EC 81 MG tablet Take 81 mg by mouth daily.    Marland Kitchen atorvastatin (LIPITOR) 40 MG tablet Take 1 tablet (40 mg total) by  mouth daily. 90 tablet 3  . benazepril (LOTENSIN) 40 MG tablet Take 1 tablet (40 mg total) by mouth every morning. 90 tablet 3  . carvedilol (COREG) 25 MG tablet TAKE ONE TABLET BY MOUTH TWICE DAILY WITH  A  MEAL 180 tablet 3  . citalopram (CELEXA) 20 MG tablet Take 1 tablet (20 mg total) by mouth daily. 30 tablet 6  . gabapentin (NEURONTIN) 600 MG tablet TAKE ONE TO TWO TABLETS BY MOUTH THREE TIMES DAILY FOR  LEFT  FOOT  NEUROPATHIC  PAIN 90 tablet 6  . glucose blood (BAYER CONTOUR NEXT TEST) test strip 1 each by Other route daily. And lancets qd 100 each 12  . Insulin Pen Needle 31G X 5 MM MISC Use with Victoza as directed 30 each 5  . Liraglutide (VICTOZA) 18 MG/3ML SOPN Inject 0.2 mLs (1.2 mg total) into the skin daily. 6 mL 12  . metFORMIN (GLUCOPHAGE-XR) 500 MG 24 hr tablet Take one tablet two times daily 180 tablet 3  . Omega-3 Fatty Acids (FISH OIL) 500 MG CAPS Take 1 capsule by mouth 2 (two) times daily.    . Testosterone (ANDROGEL) 20.25 MG/1.25GM (1.62%) GEL Apply 1 pump to each shoulder daily 1.25 g 5  .  zaleplon (SONATA) 5 MG capsule Take 1 capsule (5 mg total) by mouth at bedtime as needed for sleep. 90 capsule 0   No facility-administered medications prior to visit.    No Known Allergies  ROS As per HPI  PE: Blood pressure 145/88, pulse 76, temperature 97.8 F (36.6 C), temperature source Oral, resp. rate 16, height 6' (1.829 m), weight 288 lb 8 oz (130.863 kg), SpO2 92 %. Gen: Alert, well appearing.  Patient is oriented to person, place, time, and situation. AFFECT: pleasant, lucid thought and speech. No further exam today.  LABS:    Chemistry      Component Value Date/Time   NA 137 02/03/2015 0929   K 4.4 02/03/2015 0929   CL 100 02/03/2015 0929   CO2 29 02/03/2015 0929   BUN 15 02/03/2015 0929   CREATININE 0.92 02/03/2015 0929   CREATININE 0.90 12/12/2012 1152      Component Value Date/Time   CALCIUM 9.6 02/03/2015 0929   ALKPHOS 83 02/03/2015 0929   AST  17 02/03/2015 0929   ALT 21 02/03/2015 0929   BILITOT 0.6 02/03/2015 0929     Lab Results  Component Value Date   VITAMINB12 1107* 02/26/2014   Lab Results  Component Value Date   WBC 4.0 02/26/2014   HGB 15.1 02/26/2014   HCT 43.4 02/26/2014   MCV 88.3 02/26/2014   PLT 164.0 02/26/2014   Lab Results  Component Value Date   TSH 0.46 05/25/2015   Lab Results  Component Value Date   HGBA1C 7.5* 04/28/2015   Lab Results  Component Value Date   TESTOSTERONE 248* 06/18/2015   IMPRESSION AND PLAN:  Chronic L foot neuropathic pain: he says gabapentin is better than nothing, but we'll try switch to lyrica and see if it is something he can afford at this time: lyrica 75mg , 1 tab bid x 5d, then increase to 2 tabs bid. Ween off gabapentin: instructions---If you end up purchasing lyrica, decrease gabapentin to ONE 300 mg tab TWICE per day for 3 days, then ONE 300 mg tab once per day (bedtime) for 3 days, then stop gabapentin.  You may start lyrica once you have weened your gabapentin to one 300 mg tab per day.  An After Visit Summary was printed and given to the patient.  FOLLOW UP: Return in about 2 months (around 08/30/2015) for routine chronic illness f/u (30 min).  Signed:  Crissie Sickles, MD           06/30/2015

## 2015-06-30 NOTE — Progress Notes (Signed)
Pre visit review using our clinic review tool, if applicable. No additional management support is needed unless otherwise documented below in the visit note. 

## 2015-06-30 NOTE — Patient Instructions (Signed)
If you end up purchasing lyrica, decrease gabapentin to ONE 300 mg tab TWICE per day for 3 days, then ONE 300 mg tab once per day (bedtime) for 3 days, then stop gabapentin.  You may start lyrica once you have weened your gabapentin to one 300 mg tab per day.

## 2015-07-21 ENCOUNTER — Other Ambulatory Visit (INDEPENDENT_AMBULATORY_CARE_PROVIDER_SITE_OTHER): Payer: BLUE CROSS/BLUE SHIELD

## 2015-07-21 DIAGNOSIS — E291 Testicular hypofunction: Secondary | ICD-10-CM

## 2015-07-23 ENCOUNTER — Other Ambulatory Visit: Payer: Self-pay | Admitting: Family Medicine

## 2015-07-23 LAB — TESTOSTERONE, FREE, TOTAL, SHBG
Sex Hormone Binding: 27 nmol/L (ref 19.3–76.4)
TESTOSTERONE FREE: 6.1 pg/mL — AB (ref 7.2–24.0)
Testosterone: 233 ng/dL — ABNORMAL LOW (ref 264–916)

## 2015-07-23 MED ORDER — TESTOSTERONE CYPIONATE 200 MG/ML IM SOLN: 200.0000 mg | INTRAMUSCULAR | Status: DC

## 2015-07-30 ENCOUNTER — Other Ambulatory Visit: Payer: Self-pay | Admitting: *Deleted

## 2015-07-30 DIAGNOSIS — G4700 Insomnia, unspecified: Secondary | ICD-10-CM

## 2015-07-30 MED ORDER — ZALEPLON 5 MG PO CAPS: 5.0000 mg | ORAL_CAPSULE | Freq: Every evening | ORAL | 1 refills | Status: DC | PRN

## 2015-07-30 NOTE — Telephone Encounter (Signed)
RF request for sonata LOV: 04/28/14 Next ov: 08/02/15 Last written:07/22/14 #90 w/ 0RF  Please advise.Thanks.

## 2015-07-30 NOTE — Telephone Encounter (Signed)
Rx faxed

## 2015-08-02 ENCOUNTER — Encounter: Payer: Self-pay | Admitting: Family Medicine

## 2015-08-02 ENCOUNTER — Ambulatory Visit (INDEPENDENT_AMBULATORY_CARE_PROVIDER_SITE_OTHER): Payer: BLUE CROSS/BLUE SHIELD | Admitting: Family Medicine

## 2015-08-02 ENCOUNTER — Ambulatory Visit: Payer: BLUE CROSS/BLUE SHIELD | Admitting: Pulmonary Disease

## 2015-08-02 VITALS — BP 164/96 | HR 67 | Temp 98.0°F | Resp 16 | Ht 72.0 in | Wt 286.5 lb

## 2015-08-02 DIAGNOSIS — F419 Anxiety disorder, unspecified: Principal | ICD-10-CM

## 2015-08-02 DIAGNOSIS — F418 Other specified anxiety disorders: Secondary | ICD-10-CM | POA: Diagnosis not present

## 2015-08-02 DIAGNOSIS — F4310 Post-traumatic stress disorder, unspecified: Secondary | ICD-10-CM | POA: Diagnosis not present

## 2015-08-02 DIAGNOSIS — F329 Major depressive disorder, single episode, unspecified: Secondary | ICD-10-CM

## 2015-08-02 DIAGNOSIS — F32A Depression, unspecified: Secondary | ICD-10-CM

## 2015-08-02 DIAGNOSIS — E291 Testicular hypofunction: Secondary | ICD-10-CM | POA: Diagnosis not present

## 2015-08-02 MED ORDER — TESTOSTERONE CYPIONATE 200 MG/ML IM SOLN
200.0000 mg | Freq: Once | INTRAMUSCULAR | Status: AC
  Administered 2015-08-02: 200 mg via INTRAMUSCULAR

## 2015-08-02 MED ORDER — DULOXETINE HCL 30 MG PO CPEP: 30.0000 mg | ORAL_CAPSULE | Freq: Every day | ORAL | 3 refills | Status: DC

## 2015-08-02 NOTE — Progress Notes (Signed)
Pre visit review using our clinic review tool, if applicable. No additional management support is needed unless otherwise documented below in the visit note. 
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
## 2015-08-04 ENCOUNTER — Other Ambulatory Visit: Payer: Self-pay | Admitting: Family Medicine

## 2015-08-31 ENCOUNTER — Ambulatory Visit: Payer: BLUE CROSS/BLUE SHIELD | Admitting: Family Medicine

## 2015-08-31 ENCOUNTER — Encounter: Payer: Self-pay | Admitting: Family Medicine

## 2015-09-13 ENCOUNTER — Other Ambulatory Visit: Payer: Self-pay | Admitting: Family Medicine

## 2015-09-13 DIAGNOSIS — I10 Essential (primary) hypertension: Secondary | ICD-10-CM

## 2015-10-21 ENCOUNTER — Other Ambulatory Visit: Payer: Self-pay | Admitting: Family Medicine

## 2015-10-21 MED ORDER — ALPRAZOLAM 0.5 MG PO TABS: 0.5000 mg | ORAL_TABLET | Freq: Three times a day (TID) | ORAL | 5 refills | Status: DC | PRN

## 2015-10-21 MED ORDER — PREGABALIN 150 MG PO CAPS: 150.0000 mg | ORAL_CAPSULE | Freq: Two times a day (BID) | ORAL | 1 refills | Status: DC

## 2015-10-21 NOTE — Telephone Encounter (Signed)
Please advise Rfs for patient,  Rx requested for 90 day supplys.  Patient No Showed last appt 08/31/15.

## 2015-11-16 ENCOUNTER — Encounter (HOSPITAL_COMMUNITY): Payer: Self-pay

## 2015-12-03 DIAGNOSIS — D509 Iron deficiency anemia, unspecified: Secondary | ICD-10-CM

## 2015-12-03 HISTORY — DX: Iron deficiency anemia, unspecified: D50.9

## 2015-12-18 ENCOUNTER — Other Ambulatory Visit: Payer: Self-pay | Admitting: Family Medicine

## 2015-12-18 DIAGNOSIS — I10 Essential (primary) hypertension: Secondary | ICD-10-CM

## 2015-12-20 NOTE — Telephone Encounter (Signed)
RF request for amlodipine LOV: 04/28/15 Next ov: None Last written: 09/13/15 #90 w/ 0RF

## 2015-12-23 ENCOUNTER — Ambulatory Visit (INDEPENDENT_AMBULATORY_CARE_PROVIDER_SITE_OTHER): Payer: BLUE CROSS/BLUE SHIELD | Admitting: Family Medicine

## 2015-12-23 ENCOUNTER — Other Ambulatory Visit: Payer: Self-pay | Admitting: Family Medicine

## 2015-12-23 ENCOUNTER — Encounter: Payer: Self-pay | Admitting: Family Medicine

## 2015-12-23 VITALS — BP 153/113 | HR 64 | Temp 98.1°F | Resp 16 | Ht 72.0 in | Wt 298.5 lb

## 2015-12-23 DIAGNOSIS — I10 Essential (primary) hypertension: Secondary | ICD-10-CM | POA: Diagnosis not present

## 2015-12-23 DIAGNOSIS — J209 Acute bronchitis, unspecified: Secondary | ICD-10-CM

## 2015-12-23 DIAGNOSIS — E291 Testicular hypofunction: Secondary | ICD-10-CM

## 2015-12-23 DIAGNOSIS — Z23 Encounter for immunization: Secondary | ICD-10-CM

## 2015-12-23 LAB — CBC WITH DIFFERENTIAL/PLATELET
BASOS ABS: 59 {cells}/uL (ref 0–200)
Basophils Relative: 1 %
EOS ABS: 295 {cells}/uL (ref 15–500)
Eosinophils Relative: 5 %
HCT: 35.6 % — ABNORMAL LOW (ref 38.5–50.0)
HEMOGLOBIN: 11.2 g/dL — AB (ref 13.2–17.1)
LYMPHS ABS: 2419 {cells}/uL (ref 850–3900)
Lymphocytes Relative: 41 %
MCH: 23.1 pg — AB (ref 27.0–33.0)
MCHC: 31.5 g/dL — AB (ref 32.0–36.0)
MCV: 73.6 fL — AB (ref 80.0–100.0)
MPV: 9.6 fL (ref 7.5–12.5)
Monocytes Absolute: 649 cells/uL (ref 200–950)
Monocytes Relative: 11 %
NEUTROS PCT: 42 %
Neutro Abs: 2478 cells/uL (ref 1500–7800)
Platelets: 207 10*3/uL (ref 140–400)
RBC: 4.84 MIL/uL (ref 4.20–5.80)
RDW: 18.1 % — ABNORMAL HIGH (ref 11.0–15.0)
WBC: 5.9 10*3/uL (ref 3.8–10.8)

## 2015-12-23 NOTE — Progress Notes (Signed)
Pre visit review using our clinic review tool, if applicable. No additional management support is needed unless otherwise documented below in the visit note. 

## 2015-12-23 NOTE — Patient Instructions (Signed)
Get otc generic robitussin DM OR Mucinex DM and use as directed on the packaging for cough and congestion. Use otc generic saline nasal spray 2-3 times per day to irrigate/moisturize your nasal passages.   

## 2015-12-23 NOTE — Progress Notes (Signed)
OFFICE VISIT  12/23/2015   CC:  Chief Complaint  Patient presents with  . Cough    dry x 4 days   HPI:    Patient is a 55 y.o. Caucasian male who presents for respiratory symptoms. Of note, he has been out of amlodipine for 1 week: has not picked it up from pharmacy yet. No CP, no SOB, no palpitations, no vision complaints.  Started having a dry/hacky cough 4-5.  Some nasal cong but no runny nose, no ST, no fever.  HA on and off. No wheezing, no SOB.  No body aches.  Tried Halls and nyquil.  He is a never smoker.  No face pain or upper teeth pain.  He has dx of male hypogonadism, has consistently been taking 200mg  IM testosterone and asks to have his testosterone checked today.  Most recent testost injection was 1 week ago.    Past Medical History:  Diagnosis Date  . Anxiety   . Arthritis    lower back   . Depression   . Diabetes mellitus   . GERD (gastroesophageal reflux disease)   . Hyperlipidemia   . Hypertension   . Hypogonadism, male 05/2015  . Low back pain 2016   Left lumbar radiculopathy, lumbar spondylolisthesis  . Neuropathic pain of left foot   . OSA on CPAP    cpap settings at 3    Past Surgical History:  Procedure Laterality Date  . ADENOIDECTOMY    . BREATH TEK H PYLORI N/A 01/08/2013   Procedure: BREATH TEK H PYLORI;  Surgeon: [name], MD;  Location: [facility];  Service: General;  Laterality: N/A;  . GASTRIC ROUX-EN-Y N/A 04/14/2013   Procedure: LAPAROSCOPIC ROUX-EN-Y GASTRIC BYPASS WITH UPPER ENDOSCOPY;  Surgeon: [name], MD;  Location: [facility];  Service: General;  Laterality: N/A;  . TONSILLECTOMY      Outpatient Medications Prior to Visit  Medication Sig Dispense Refill  . ALPRAZolam (XANAX) 0.5 MG tablet Take 1 tablet (0.5 mg total) by mouth every 8 (eight) hours as needed for sleep or anxiety. 30 tablet 5  . amLODipine (NORVASC) 5 MG tablet TAKE ONE TABLET BY MOUTH ONCE DAILY 90 tablet 1  . aspirin EC 81 MG tablet Take 81 mg  by mouth daily.    Marland Kitchen atorvastatin (LIPITOR) 40 MG tablet TAKE ONE TABLET BY MOUTH ONCE DAILY 90 tablet 0  . benazepril (LOTENSIN) 40 MG tablet TAKE ONE TABLET BY MOUTH ONCE DAILY IN THE MORNING 90 tablet 1  . carvedilol (COREG) 25 MG tablet TAKE ONE TABLET BY MOUTH TWICE DAILY WITH MEALS 180 tablet 1  . glucose blood (BAYER CONTOUR NEXT TEST) test strip 1 each by Other route daily. And lancets qd 100 each 12  . Insulin Pen Needle 31G X 5 MM MISC Use with Victoza as directed 30 each 5  . metFORMIN (GLUCOPHAGE-XR) 500 MG 24 hr tablet TAKE ONE TABLET BY MOUTH TWICE DAILY 180 tablet 1  . Omega-3 Fatty Acids (FISH OIL) 500 MG CAPS Take 1 capsule by mouth 2 (two) times daily.    . pregabalin (LYRICA) 150 MG capsule Take 1 capsule (150 mg total) by mouth 2 (two) times daily. 180 capsule 1  . testosterone cypionate (DEPOTESTOSTERONE CYPIONATE) 200 MG/ML injection Inject 1 mL (200 mg total) into the muscle every 14 (fourteen) days. 10 mL 0  . zaleplon (SONATA) 5 MG capsule Take 1 capsule (5 mg total) by mouth at bedtime as needed for sleep. 90 capsule 1  .  DULoxetine (CYMBALTA) 30 MG capsule Take 1 capsule (30 mg total) by mouth daily. (Patient not taking: Reported on 12/23/2015) 30 capsule 3   No facility-administered medications prior to visit.     No Known Allergies  ROS As per HPI  PE: Blood pressure (!) 153/113, pulse 64, temperature 98.1 F (36.7 C), temperature source Oral, resp. rate 16, height 6' (1.829 m), weight 298 lb 8 oz (135.4 kg), SpO2 94 %. VS: noted--normal. Gen: alert, NAD, NONTOXIC APPEARING. HEENT: eyes without injection, drainage, or swelling.  Ears: EACs clear, TMs with normal light reflex and landmarks.  Nose: No rhinorrhea, with some dried, crusty exudate adherent to mildly injected mucosa.  No purulent d/c.  No paranasal sinus TTP.  No facial swelling.  Throat and mouth without focal lesion.  No pharyngial swelling, erythema, or exudate.   Neck: supple, no LAD.   LUNGS:  CTA bilat, nonlabored resps.  Some coughing does occur after some forceful expirations. CV: RRR, no m/r/g. EXT: no c/c/e SKIN: no rash    LABS:  Lab Results  Component Value Date   HGBA1C 7.5 (H) 04/28/2015   Lab Results  Component Value Date   TESTOSTERONE 233 (L) 07/21/2015     Chemistry      Component Value Date/Time   NA 137 02/03/2015 0929   K 4.4 02/03/2015 0929   CL 100 02/03/2015 0929   CO2 29 02/03/2015 0929   BUN 15 02/03/2015 0929   CREATININE 0.92 02/03/2015 0929   CREATININE 0.90 12/12/2012 1152      Component Value Date/Time   CALCIUM 9.6 02/03/2015 0929   ALKPHOS 83 02/03/2015 0929   AST 17 02/03/2015 0929   ALT 21 02/03/2015 0929   BILITOT 0.6 02/03/2015 0929     Lab Results  Component Value Date   WBC 4.0 02/26/2014   HGB 15.1 02/26/2014   HCT 43.4 02/26/2014   MCV 88.3 02/26/2014   PLT 164.0 02/26/2014     IMPRESSION AND PLAN:  1) Acute bronchitis, no sign of RAD.  Suspect viral etiology. Get otc generic robitussin DM OR Mucinex DM and use as directed on the packaging for cough and congestion. Use otc generic saline nasal spray 2-3 times per day to irrigate/moisturize your nasal passages.  2) Hypogonadism: on testost 200 mg IM q2 wks.  Pt's most recent injection was 1 week ago and he would like to see his level, so I drew testosterone level and CBC today.  3) Elevated bp, pt with dx of HTN: noncompliant with amlodipine.  He will go pick this up today and restart it.  An After Visit Summary was printed and given to the patient.  FOLLOW UP: Return for 1-2 mo for routin chronic illness f/u (30 min).  Signed:  Crissie Sickles, MD           12/23/2015

## 2015-12-24 ENCOUNTER — Other Ambulatory Visit: Payer: Self-pay | Admitting: *Deleted

## 2015-12-24 ENCOUNTER — Encounter: Payer: Self-pay | Admitting: Family Medicine

## 2015-12-24 DIAGNOSIS — D509 Iron deficiency anemia, unspecified: Secondary | ICD-10-CM

## 2015-12-24 LAB — IRON AND TIBC
%SAT: 7 % — ABNORMAL LOW (ref 15–60)
Iron: 26 ug/dL — ABNORMAL LOW (ref 50–180)
TIBC: 400 ug/dL (ref 250–425)
UIBC: 374 ug/dL (ref 125–400)

## 2015-12-24 LAB — TESTOSTERONE: TESTOSTERONE: 529 ng/dL (ref 250–827)

## 2015-12-28 ENCOUNTER — Other Ambulatory Visit: Payer: Self-pay | Admitting: *Deleted

## 2015-12-29 ENCOUNTER — Other Ambulatory Visit: Payer: Self-pay | Admitting: *Deleted

## 2015-12-29 DIAGNOSIS — D509 Iron deficiency anemia, unspecified: Secondary | ICD-10-CM

## 2016-01-11 ENCOUNTER — Encounter: Payer: BLUE CROSS/BLUE SHIELD | Attending: Surgery | Admitting: Dietician

## 2016-01-11 ENCOUNTER — Encounter: Payer: Self-pay | Admitting: Dietician

## 2016-01-11 DIAGNOSIS — Z9884 Bariatric surgery status: Secondary | ICD-10-CM | POA: Insufficient documentation

## 2016-01-11 DIAGNOSIS — Z713 Dietary counseling and surveillance: Secondary | ICD-10-CM | POA: Diagnosis present

## 2016-01-11 NOTE — Progress Notes (Signed)
  Medical Nutrition Therapy:  Appt start time: 335 end time:  430.   Assessment:  Primary concerns today:  Ethan Pitts returns for a bariatric nutrition follow up; RYGB in April 2015. States that he had lost down to about 265 lbs post op from his highest weight of 330 lbs. States he is currently about 285 lbs. 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 States that he felt great at 265 lbs. Can tolerate all foods but volume is still restricted. Recently adopted a 37-year old boy. He lives with his wife and son. They both do the grocery shopping and cooking. Drinks at least 64 oz of fluid per day.  No vomiting or diarrhea. Has dumping syndrome with ice cream.    First goal: 265 lbs Goal weight: 230-240 lbs   Preferred Learning Style:   No preference indicated   Learning Readiness:  Ready  MEDICATIONS: see list   DIETARY INTAKE:  Usual eating pattern includes 2-3 meals and 1 snacks per day.  24-hr recall:  B ( AM): weekends: sausage or liver pudding and eggs, sometimes biscuits and gravy; weekdays: frozen breakfast Snk ( AM):   L ( PM): BBQ sandwich, sometimes skips  Snk ( PM): none D ( PM): soup  Snk ( PM): usually beer and chips in the evenings  Beverages: water, unsweet tea with Splenda, beer, rarely soda  Usual physical activity: none  Estimated energy needs: 1600-1800 calories 180-200 g carbohydrates 120-135 g protein 44-50 g fat  Progress Towards Goal(s):  In progress.   Nutritional Diagnosis:  -3.4 Unintentional weight gain As related to excessive carbohydrate and energy intake following bariatric surgery.  As evidenced by patient report of recent 20-lb weight gain.    Intervention:  Nutrition counseling provided. -Avoid keeping trigger foods and drinks in the house  -Keep some healthier, high protein foods on hand at home and in the car  (P3 packs, balanced breaks, [employer] protein chips)  -Pre-portion ALL snack foods -Eat protein foods first, then vegetables -Try the roasted chicken bites at [employer] -Start walking 3 or 4 days a week  -Ask Nira Conn to hold you accountable  -Avoid skipping meals -Multivitamin: Bariatric Advantage or Celebrate chewable or capsule  -Calcium: any brand (500 mg 3x a day, 2 hours apart) *Remember that iron (in your multivitamin) and calcium bind in the body so take 2 hours apart  Teaching Method Utilized:  Visual Auditory Hands on  Handouts given during visit include:  none  Barriers to learning/adherence to lifestyle change: food preferences and ability to tolerate foods that are not recommended  Demonstrated degree of understanding via:  Teach Back   Monitoring/Evaluation:  Dietary intake, exercise, and body weight in 3 week(s).

## 2016-01-11 NOTE — Patient Instructions (Addendum)
-  Avoid keeping trigger foods and drinks in the house  -Keep some healthier, high protein foods on hand at home and in the car (P3 packs, balanced breaks, [employer] protein chips)  -Pre-portion ALL snack foods  -Eat protein foods first, then vegetables -Try the roasted chicken bites at [employer]  -Start walking 3 or 4 days a week   -Ask Nira Conn to hold you accountable   -Avoid skipping meals  -Multivitamin: Bariatric Advantage or Celebrate chewable or capsule  -Calcium: any brand (500 mg 3x a day, 2 hours apart)  *Remember that iron (in your multivitamin) and calcium bind in the body so take 2 hours apart

## 2016-01-26 ENCOUNTER — Encounter: Payer: Self-pay | Admitting: Family Medicine

## 2016-01-26 ENCOUNTER — Other Ambulatory Visit: Payer: BLUE CROSS/BLUE SHIELD

## 2016-01-26 DIAGNOSIS — D509 Iron deficiency anemia, unspecified: Secondary | ICD-10-CM

## 2016-01-26 LAB — HEMOCCULT SLIDES (X 3 CARDS)
Fecal Occult Blood: NEGATIVE
OCCULT 1: NEGATIVE
OCCULT 2: NEGATIVE
OCCULT 3: NEGATIVE
OCCULT 4: NEGATIVE
OCCULT 5: NEGATIVE

## 2016-02-08 ENCOUNTER — Other Ambulatory Visit: Payer: Self-pay | Admitting: Family Medicine

## 2016-02-08 NOTE — Telephone Encounter (Signed)
[facility].  RF request for Testosterone LOV: 12/23/15 Next ov: None Last written: 07/23/15 #43ml w/ 0RF  Rx for Victoza was d/c due to cost.

## 2016-02-09 NOTE — Telephone Encounter (Signed)
Rx faxed

## 2016-02-10 ENCOUNTER — Other Ambulatory Visit: Payer: Self-pay | Admitting: Family Medicine

## 2016-02-18 ENCOUNTER — Other Ambulatory Visit: Payer: Self-pay | Admitting: Family Medicine

## 2016-02-21 ENCOUNTER — Telehealth: Payer: Self-pay | Admitting: Family Medicine

## 2016-02-21 NOTE — Telephone Encounter (Signed)
**  Remind patient they can make refill requests via MyChart**  Medication refill request (Name & Dosage):Testosterone Cypionate 200 mg/ml injection and Insulin Pen Needle 31G x 5 mm Victoza   Preferred pharmacy (Name & Address):  [place] in [facility]     Other comments (if applicable):

## 2016-02-22 MED ORDER — LIRAGLUTIDE 18 MG/3ML ~~LOC~~ SOPN: 1.2000 mg | PEN_INJECTOR | Freq: Every day | SUBCUTANEOUS | 3 refills | Status: DC

## 2016-02-22 MED ORDER — INSULIN PEN NEEDLE 31G X 5 MM MISC: 11 refills | Status: DC

## 2016-02-22 NOTE — Telephone Encounter (Signed)
SW Gerald Stabs at [facility] and he stated that they are waiting on PA for testosterone. They are going to refax request to [place]. I checked Laura's desk to see if she had already started PA, she had and we are waiting to hear back from pts insurance.   SW Erasmo Downer Warehouse manager) at [facility] and gave verbal order to refill Rx for victoza pen needles.

## 2016-02-22 NOTE — Telephone Encounter (Signed)
PA for testosterone was approved, approval letter faxed to [facility]. Pt advised and voiced understanding.

## 2016-02-22 NOTE — Telephone Encounter (Signed)
Victoza eRx'd to [facility] in [place].

## 2016-02-22 NOTE — Telephone Encounter (Signed)
SW pt and advised him that we are waiting to hear back from his insurance on the PA for his testosterone. He voiced understanding.   I advised him that we sent in Rx for pen needles, pt asked about victoza refill. He stated that he has been off of it for about 2 months due to cost but his insurance will now cover a better portion of the cost so he can afford it. This medicaiton is not currently on his medication list. Please advise. Thanks.   RF request for Victoza LOV: 04/28/15 - last f/u for DM Next ov:  None Last written: 02/03/15 #47ml w/ 12RF

## 2016-03-27 ENCOUNTER — Other Ambulatory Visit: Payer: Self-pay | Admitting: *Deleted

## 2016-03-27 MED ORDER — ZALEPLON 5 MG PO CAPS
5.0000 mg | ORAL_CAPSULE | Freq: Every evening | ORAL | 1 refills | Status: DC | PRN
Start: 2016-03-27 — End: 2016-12-07

## 2016-03-27 NOTE — Telephone Encounter (Signed)
Rx faxed

## 2016-03-27 NOTE — Telephone Encounter (Signed)
[facility].  RF request for sonata LOV: 12/23/15  Next ov: None Last written: 07/30/15 #90 w/ 1RF  Please advise. Thanks.

## 2016-03-30 ENCOUNTER — Telehealth: Payer: Self-pay | Admitting: Pulmonary Disease

## 2016-03-30 ENCOUNTER — Encounter: Payer: Self-pay | Admitting: Physician Assistant

## 2016-03-30 ENCOUNTER — Ambulatory Visit (INDEPENDENT_AMBULATORY_CARE_PROVIDER_SITE_OTHER): Payer: BLUE CROSS/BLUE SHIELD | Admitting: Physician Assistant

## 2016-03-30 ENCOUNTER — Telehealth: Payer: Self-pay | Admitting: Adult Health

## 2016-03-30 VITALS — BP 140/88 | HR 71 | Temp 98.0°F | Ht 72.0 in | Wt 288.4 lb

## 2016-03-30 DIAGNOSIS — M545 Low back pain, unspecified: Secondary | ICD-10-CM

## 2016-03-30 DIAGNOSIS — G4733 Obstructive sleep apnea (adult) (pediatric): Secondary | ICD-10-CM

## 2016-03-30 DIAGNOSIS — M79605 Pain in left leg: Principal | ICD-10-CM

## 2016-03-30 MED ORDER — PREDNISONE 20 MG PO TABS: 40.0000 mg | ORAL_TABLET | Freq: Every day | ORAL | 0 refills | Status: DC

## 2016-03-30 NOTE — Progress Notes (Addendum)
Ethan Pitts is a 56 y.o. male here for a new problem.    History of Present Illness:   Chief Complaint  Patient presents with  . Fall    on Sunday, fell off deck  . Left sciatica pain    Left buttock radiating down left leg    HPI   Patient fell off of his deck on Sunday. He has a history of L back pain with sciatica. Taking 800mg  ibuprofen intermittently with moderate relief. Pain is worse with sitting and driving. 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  He went to his chiropractor today and yesterday and his chiropractor recommended prednisone to help with inflammation. Has had prednisone in the past, tolerated well. Was on gabapentin up until 2-3 weeks ago, and is now on lyrica which he can now afford. Blood sugars at home have been 130-140. Most recent HgbA1c was 7.5 Taking DM meds as prescribed. Pain is radiating down left leg. Denies any new numbness or tingling down his legs or issues with bowel/bladder incontinence. Denies fevers.  PMHx, SurgHx, SocialHx, Medications, and Allergies were reviewed in the Visit Navigator and updated as appropriate.  Current Medications:   Current Outpatient Prescriptions:  .  amLODipine (NORVASC) 5 MG tablet, TAKE ONE TABLET BY MOUTH ONCE DAILY, Disp: 90 tablet, Rfl: 1 .  aspirin EC 81 MG tablet, Take 81 mg by mouth daily., Disp: , Rfl:  .  benazepril (LOTENSIN) 40 MG tablet, TAKE ONE TABLET BY MOUTH ONCE DAILY IN THE MORNING, Disp: 90 tablet, Rfl: 1 .  carvedilol (COREG) 25 MG tablet, TAKE ONE TABLET BY MOUTH TWICE DAILY WITH MEALS, Disp: 180 tablet, Rfl: 1 .  glucose blood (BAYER CONTOUR NEXT TEST) test strip, 1 each by Other route daily. And lancets qd, Disp: 100 each, Rfl: 12 .  Insulin Pen Needle 31G X 5 MM MISC, Use with Victoza as directed, Disp: 100 each, Rfl: 11 .  liraglutide (VICTOZA) 18 MG/3ML SOPN, Inject 0.2 mLs (1.2 mg total) into the skin daily., Disp: 15 mL, Rfl: 3 .  metFORMIN (GLUCOPHAGE-XR) 500 MG 24 hr  tablet, TAKE ONE TABLET BY MOUTH TWICE DAILY, Disp: 180 tablet, Rfl: 1 .  Omega-3 Fatty Acids (FISH OIL) 500 MG CAPS, Take 1 capsule by mouth 2 (two) times daily., Disp: , Rfl:  .  pregabalin (LYRICA) 150 MG capsule, Take 1 capsule (150 mg total) by mouth 2 (two) times daily., Disp: 180 capsule, Rfl: 1 .  testosterone cypionate (DEPOTESTOSTERONE CYPIONATE) 200 MG/ML injection, INJECT 1 ML INTO THE MUSCLE EVERY 14 DAYS, Disp: 10 mL, Rfl: 0 .  zaleplon (SONATA) 5 MG capsule, Take 1 capsule (5 mg total) by mouth at bedtime as needed for sleep., Disp: 90 capsule, Rfl: 1 .  ALPRAZolam (XANAX) 0.5 MG tablet, Take 1 tablet (0.5 mg total) by mouth every 8 (eight) hours as needed for sleep or anxiety. (Patient not taking: Reported on 03/30/2016), Disp: 30 tablet, Rfl: 5 .  atorvastatin (LIPITOR) 40 MG tablet, TAKE ONE TABLET BY MOUTH ONCE DAILY (Patient not taking: Reported on 03/30/2016), Disp: 90 tablet, Rfl: 0 .  predniSONE (DELTASONE) 20 MG tablet, Take 2 tablets (40 mg total) by mouth daily., Disp: 10 tablet, Rfl: 0   Review of Systems:   Review of Systems  Constitutional: Negative.   HENT: Positive for congestion.   Eyes: Negative.   Respiratory: Negative.   Cardiovascular: Negative.   Gastrointestinal: Negative.   Genitourinary: Negative.   Musculoskeletal: Positive  for back pain.       Left sciatica, radiating down left leg  Skin: Negative.   Neurological: Negative for dizziness.    Vitals:   Vitals:   03/30/16 1255  BP: 140/88  Pulse: 71  Temp: 98 F (36.7 C)  TempSrc: Oral  SpO2: 96%  Weight: 288 lb 6.1 oz (130.8 kg)  Height: 6' (1.829 m)     Body mass index is 39.11 kg/m.  Physical Exam:   Physical Exam  Constitutional: He appears well-developed. He is cooperative.  Non-toxic appearance. He does not have a sickly appearance. He does not appear ill. No distress.  Cardiovascular: Normal rate, regular rhythm, S1 normal, S2 normal, normal heart sounds and normal pulses.   No  LE edema  Pulmonary/Chest: Effort normal and breath sounds normal.  Musculoskeletal:       Lumbar back: He exhibits tenderness. He exhibits no swelling, no edema, no deformity and no laceration.  Paraspinal tenderness to L lumbar region; limited ROM with flexion movement  Neurological: He is alert.  Nursing note and vitals reviewed.     Assessment and Plan:    Ethan Pitts was seen today for fall and left sciatica pain.  Diagnoses and all orders for this visit:  Lumbar pain with radiation down left leg  Other orders -     predniSONE (DELTASONE) 20 MG tablet; Take 2 tablets (40 mg total) by mouth daily.   Patient is about to travel for 10 hours to go on a cruise, would like some relief with prednisone. He prefers oral prednisone will order 40 mg twice a day for 5 days. I discussed continuing to rest and do light stretching. I advised patient that if he develops any changes in back pain, issues with bowel or bladder incontinence, or fevers he needs to be reevaluated immediately.   . Reviewed expectations re: course of current medical issues. . Discussed self-management of symptoms. . Outlined signs and symptoms indicating need for more acute intervention. . Patient verbalized understanding and all questions were answered. . See orders for this visit as documented in the electronic medical record. . Patient received an After-Visit Summary.   Inda Coke, PA-C

## 2016-03-30 NOTE — Telephone Encounter (Signed)
Called and spoke with pt and he is aware of appt with VS on 5/31 at 3:15 to establish care with VS for OSA.  He is aware that order has been sent to [facility] for supplies for his cpap since he has been out of these.  He is aware that he will need to keep the appt with VS>

## 2016-03-30 NOTE — Telephone Encounter (Signed)
Ethan Pitts ° °

## 2016-03-30 NOTE — Patient Instructions (Signed)
It was great meeting you.  Take the steroid as prescribed. Take with food to prevent stomach upset.   Back Pain, Adult Back pain is very common in adults.The cause of back pain is rarely dangerous and the pain often gets better over time.The cause of your back pain may not be known. Some common causes of back pain include:  Strain of the muscles or ligaments supporting the spine.  Wear and tear (degeneration) of the spinal disks.  Arthritis.  Direct injury to the back. For many people, back pain may return. Since back pain is rarely dangerous, most people can learn to manage this condition on their own. Follow these instructions at home: Watch your back pain for any changes. The following actions may help to lessen any discomfort you are feeling:  Remain active. It is stressful on your back to sit or stand in one place for long periods of time. Do not sit, drive, or stand in one place for more than 30 minutes at a time. Take short walks on even surfaces as soon as you are able.Try to increase the length of time you walk each day.  Exercise regularly as directed by your health care provider. Exercise helps your back heal faster. It also helps avoid future injury by keeping your muscles strong and flexible.  Do not stay in bed.Resting more than 1-2 days can delay your recovery.  Pay attention to your body when you bend and lift. The most comfortable positions are those that put less stress on your recovering back. Always use proper lifting techniques, including:  Bending your knees.  Keeping the load close to your body.  Avoiding twisting.  Find a comfortable position to sleep. Use a firm mattress and lie on your side with your knees slightly bent. If you lie on your back, put a pillow under your knees.  Avoid feeling anxious or stressed.Stress increases muscle tension and can worsen back pain.It is important to recognize when you are anxious or stressed and learn ways to  manage it, such as with exercise.  Take medicines only as directed by your health care provider. Over-the-counter medicines to reduce pain and inflammation are often the most helpful.Your health care provider may prescribe muscle relaxant drugs.These medicines help dull your pain so you can more quickly return to your normal activities and healthy exercise.  Apply ice to the injured area:  Put ice in a plastic bag.  Place a towel between your skin and the bag.  Leave the ice on for 20 minutes, 2-3 times a day for the first 2-3 days. After that, ice and heat may be alternated to reduce pain and spasms.  Maintain a healthy weight. Excess weight puts extra stress on your back and makes it difficult to maintain good posture. Contact a health care provider if:  You have pain that is not relieved with rest or medicine.  You have increasing pain going down into the legs or buttocks.  You have pain that does not improve in one week.  You have night pain.  You lose weight.  You have a fever or chills. Get help right away if:  You develop new bowel or bladder control problems.  You have unusual weakness or numbness in your arms or legs.  You develop nausea or vomiting.  You develop abdominal pain.  You feel faint. This information is not intended to replace advice given to you by your health care provider. Make sure you discuss any questions you have with  your health care provider. Document Released: 12/19/2004 Document Revised: 04/29/2015 Document Reviewed: 04/22/2013 Elsevier Interactive Patient Education  2017 [employer].

## 2016-03-30 NOTE — Progress Notes (Signed)
Pre visit review using our clinic review tool, if applicable. No additional management support is needed unless otherwise documented below in the visit note. 

## 2016-04-02 ENCOUNTER — Encounter: Payer: Self-pay | Admitting: Family Medicine

## 2016-04-03 ENCOUNTER — Telehealth: Payer: Self-pay | Admitting: Adult Health

## 2016-04-03 DIAGNOSIS — G4733 Obstructive sleep apnea (adult) (pediatric): Secondary | ICD-10-CM

## 2016-04-03 NOTE — Telephone Encounter (Signed)
Spoke with pt, states that rx for cpap supplies sent last week was incorrect and needs to be more specific- stating what supplies are needed.  New order has been placed.  Will close encounter.

## 2016-04-23 ENCOUNTER — Other Ambulatory Visit: Payer: Self-pay | Admitting: Family Medicine

## 2016-04-24 NOTE — Telephone Encounter (Signed)
Rx faxed

## 2016-04-24 NOTE — Telephone Encounter (Signed)
[facility].  RF request for lyrica LOV: 12/23/15 Next ov: None Last written: 10/21/15 #180 w/ 1RF  Please advise. Thanks.

## 2016-04-25 ENCOUNTER — Other Ambulatory Visit: Payer: Self-pay | Admitting: Family Medicine

## 2016-04-26 ENCOUNTER — Other Ambulatory Visit: Payer: Self-pay | Admitting: *Deleted

## 2016-04-26 MED ORDER — BENAZEPRIL HCL 40 MG PO TABS: ORAL_TABLET | ORAL | 1 refills | Status: DC

## 2016-04-26 NOTE — Telephone Encounter (Signed)
[facility].  RF request for benazepril LOV: 12/23/15 Next ov: None Last written: 10/21/15 #90 w/ 1RF

## 2016-04-26 NOTE — Telephone Encounter (Signed)
[facility]  RF request for metformin LOV: 12/23/15 Next ov: None Last written: 10/21/15 #180 w/ 1RF  RF request for carvedilol Last written: 10/21/15 #180 w/ 1RF

## 2016-05-10 ENCOUNTER — Ambulatory Visit (INDEPENDENT_AMBULATORY_CARE_PROVIDER_SITE_OTHER): Payer: BLUE CROSS/BLUE SHIELD | Admitting: Family Medicine

## 2016-05-10 ENCOUNTER — Encounter: Payer: Self-pay | Admitting: Family Medicine

## 2016-05-10 VITALS — BP 151/80 | HR 73 | Temp 97.7°F | Resp 16 | Ht 72.0 in | Wt 280.5 lb

## 2016-05-10 DIAGNOSIS — M5440 Lumbago with sciatica, unspecified side: Secondary | ICD-10-CM

## 2016-05-10 DIAGNOSIS — G8929 Other chronic pain: Secondary | ICD-10-CM | POA: Diagnosis not present

## 2016-05-10 DIAGNOSIS — M5442 Lumbago with sciatica, left side: Secondary | ICD-10-CM | POA: Diagnosis not present

## 2016-05-10 MED ORDER — PREDNISONE 20 MG PO TABS: ORAL_TABLET | ORAL | 0 refills | Status: DC

## 2016-05-10 NOTE — Progress Notes (Signed)
OFFICE VISIT  05/10/2016   CC:  Chief Complaint  Patient presents with  . Sciatica    left side     HPI:    Patient is a 56 y.o. Caucasian male who presents for "sciatica pain". Has hx of low back pain with left lumbar radiculopathy, lumbar spondylolisthesis (mild, grade I retrolisthesis L4 on L5)---had sciatica episode 03/2016 and was treated with 40mg  prednisone x 5d.  This helped significantly. Had a back MD, Dr. Maia Petties, pt says the office manager made her mad so he stopped going there. They had discussed injection and surgery but had been trying conservative measures first. He did PT at Dr. Henriette Combs about 18-24 mo ago. Pt says he did MRI on back at one point: says the MRI results were unremarkable for any nerve impingement.  Describes acute on chronic pain in L LB region that radiates down L leg posterior aspect, + chronic numbness in entire foot.  No saddle anesthesia, no loss of bowel/bladder function.  No leg weakness.   Past Medical History:  Diagnosis Date  . Anxiety   . Arthritis    lower back   . Depression   . Diabetes mellitus   . GERD (gastroesophageal reflux disease)   . Hyperlipidemia   . Hypertension   . Hypogonadism, male 05/2015  . Low back pain 2016   Left lumbar radiculopathy, lumbar spondylolisthesis.  Sciatica episode 03/2016.  . Microcytic anemia 12/2015   suspect iron def due to malabsorption.  Hemoccults NEG 01/26/16, ferrous sulfate started.  . Neuropathic pain of left foot   . OSA on CPAP    cpap settings at 3    Past Surgical History:  Procedure Laterality Date  . ADENOIDECTOMY    . BREATH TEK H PYLORI N/A 01/08/2013   Procedure: BREATH TEK H PYLORI;  Surgeon: [name], MD;  Location: [facility];  Service: General;  Laterality: N/A;  . GASTRIC ROUX-EN-Y N/A 04/14/2013   Procedure: LAPAROSCOPIC ROUX-EN-Y GASTRIC BYPASS WITH UPPER ENDOSCOPY;  Surgeon: [name], MD;  Location: [facility];  Service: General;  Laterality: N/A;  .  TONSILLECTOMY      Outpatient Medications Prior to Visit  Medication Sig Dispense Refill  . ALPRAZolam (XANAX) 0.5 MG tablet Take 1 tablet (0.5 mg total) by mouth every 8 (eight) hours as needed for sleep or anxiety. 30 tablet 5  . amLODipine (NORVASC) 5 MG tablet TAKE ONE TABLET BY MOUTH ONCE DAILY 90 tablet 1  . aspirin EC 81 MG tablet Take 81 mg by mouth daily.    . benazepril (LOTENSIN) 40 MG tablet TAKE ONE TABLET BY MOUTH ONCE DAILY IN THE MORNING 90 tablet 1  . carvedilol (COREG) 25 MG tablet TAKE ONE TABLET BY MOUTH TWICE DAILY WITH MEALS 180 tablet 1  . glucose blood (BAYER CONTOUR NEXT TEST) test strip 1 each by Other route daily. And lancets qd 100 each 12  . Insulin Pen Needle 31G X 5 MM MISC Use with Victoza as directed 100 each 11  . liraglutide (VICTOZA) 18 MG/3ML SOPN Inject 0.2 mLs (1.2 mg total) into the skin daily. 15 mL 3  . LYRICA 150 MG capsule TAKE ONE CAPSULE BY MOUTH TWICE DAILY 60 capsule 5  . metFORMIN (GLUCOPHAGE-XR) 500 MG 24 hr tablet TAKE ONE TABLET BY MOUTH TWICE DAILY 180 tablet 1  . Omega-3 Fatty Acids (FISH OIL) 500 MG CAPS Take 1 capsule by mouth 2 (two) times daily.    Marland Kitchen testosterone cypionate (DEPOTESTOSTERONE CYPIONATE) 200  MG/ML injection INJECT 1 ML INTO THE MUSCLE EVERY 14 DAYS 10 mL 0  . zaleplon (SONATA) 5 MG capsule Take 1 capsule (5 mg total) by mouth at bedtime as needed for sleep. 90 capsule 1  . atorvastatin (LIPITOR) 40 MG tablet TAKE ONE TABLET BY MOUTH ONCE DAILY (Patient not taking: Reported on 03/30/2016) 90 tablet 0  . predniSONE (DELTASONE) 20 MG tablet Take 2 tablets (40 mg total) by mouth daily. (Patient not taking: Reported on 05/10/2016) 10 tablet 0   No facility-administered medications prior to visit.     No Known Allergies  ROS As per HPI  PE: Blood pressure (!) 151/80, pulse 73, temperature 97.7 F (36.5 C), temperature source Oral, resp. rate 16, height 6' (1.829 m), weight 280 lb 8 oz (127.2 kg), SpO2 94 %. Gen: Alert,  well appearing.  Patient is oriented to person, place, time, and situation. AFFECT: pleasant, lucid thought and speech. BACK: left lower lumbar facet region TTP, o/w no back tenderness. ROM: pain in flexion and rotation, extension ok. Sitting SLR neg bilat. LE strength 5/5 prox/dist bilat. Patellar and achilles reflexes: cannot elicit on either side.  LABS:  None today  IMPRESSION AND PLAN:  Acute-on-chronic left sided low back pain with left sided sciatica that is becoming more recurrent/chronic. Refer to PT, give prednisone 40mg  qd x 5d, then 20mg  qd x 5d. If not progress being made in 6 wks or if worsening before that time, the next step is to revisit his back surgeon, Dr. Maia Petties for consideration of repeat imaging and/or interventional treatments.  An After Visit Summary was printed and given to the patient.  FOLLOW UP: Return in about 6 weeks (around 06/21/2016) for routine chronic illness f/u+ f/u back (30 min appt).  Signed:  Crissie Sickles, MD           05/10/2016

## 2016-06-01 ENCOUNTER — Institutional Professional Consult (permissible substitution): Payer: BLUE CROSS/BLUE SHIELD | Admitting: Pulmonary Disease

## 2016-06-06 ENCOUNTER — Encounter: Payer: Self-pay | Admitting: Family Medicine

## 2016-06-21 ENCOUNTER — Ambulatory Visit: Payer: BLUE CROSS/BLUE SHIELD | Admitting: Family Medicine

## 2016-06-22 ENCOUNTER — Ambulatory Visit (INDEPENDENT_AMBULATORY_CARE_PROVIDER_SITE_OTHER): Payer: BLUE CROSS/BLUE SHIELD | Admitting: Family Medicine

## 2016-06-22 ENCOUNTER — Encounter: Payer: Self-pay | Admitting: Family Medicine

## 2016-06-22 VITALS — BP 112/71 | HR 82 | Temp 98.3°F | Resp 16 | Ht 72.0 in | Wt 279.0 lb

## 2016-06-22 DIAGNOSIS — E7800 Pure hypercholesterolemia, unspecified: Secondary | ICD-10-CM

## 2016-06-22 DIAGNOSIS — K9089 Other intestinal malabsorption: Secondary | ICD-10-CM

## 2016-06-22 DIAGNOSIS — E119 Type 2 diabetes mellitus without complications: Secondary | ICD-10-CM | POA: Diagnosis not present

## 2016-06-22 DIAGNOSIS — E291 Testicular hypofunction: Secondary | ICD-10-CM | POA: Diagnosis not present

## 2016-06-22 DIAGNOSIS — M5416 Radiculopathy, lumbar region: Secondary | ICD-10-CM

## 2016-06-22 DIAGNOSIS — D508 Other iron deficiency anemias: Secondary | ICD-10-CM | POA: Diagnosis not present

## 2016-06-22 DIAGNOSIS — M5417 Radiculopathy, lumbosacral region: Secondary | ICD-10-CM | POA: Diagnosis not present

## 2016-06-22 NOTE — Progress Notes (Signed)
OFFICE VISIT  06/22/2016   CC:  Chief Complaint  Patient presents with  . Follow-up    RCI, pt is not fasting.      HPI:    Patient is a 56 y.o. Caucasian male who presents for 6 week f/u DM 2, also chronic L low back pain with L radiculopathy that had progressively been worsening when I last saw him. We referred him to PT and I did a 10 day course of prednisone.  If these measures were not helping at this juncture, then the plan was to get him back to see his back surgeon, Dr. Maia Petties. He went to PT once and he was given a home exercise/strengthening plan.   He is still suffering from the same L low back pain and left leg radiculopathy sx's.  DM 2: home glucoses; occ monitoring--most recent was 190 fasting (?).   Takes the metformin daily, was on victoza daily but missed 3 wks due to cost, has been back on it for about 1 week now. Feet: right foot asymptomatic, left foot with entire foot numb x 2 yrs or so (in the past we have felt like this is secondary to his L lumbar radiculopathy.  He does take iron a couple days a week, as well as otc oral B12 supplement a couple times a week  (hx of roux-en-Y bipass surg---b12's have been normal, but he had mild microcytic anemia and borderline low iron 01/2016--hemoccult neg).    Past Medical History:  Diagnosis Date  . Anxiety   . Arthritis    lower back   . Depression   . Diabetes mellitus   . GERD (gastroesophageal reflux disease)   . Hyperlipidemia   . Hypertension   . Hypogonadism, male 05/2015  . Low back pain 2016   Left lumbar radiculopathy, lumbar spondylolisthesis.  Sciatica episode 03/2016.  . Microcytic anemia 12/2015   suspect iron def due to malabsorption.  Hemoccults NEG 01/26/16, ferrous sulfate started.  . Neuropathic pain of left foot   . OSA on CPAP    cpap settings at 3    Past Surgical History:  Procedure Laterality Date  . ADENOIDECTOMY    . BREATH TEK H PYLORI N/A 01/08/2013   Procedure: BREATH TEK H PYLORI;   Surgeon: [name], MD;  Location: [facility];  Service: General;  Laterality: N/A;  . GASTRIC ROUX-EN-Y N/A 04/14/2013   Procedure: LAPAROSCOPIC ROUX-EN-Y GASTRIC BYPASS WITH UPPER ENDOSCOPY;  Surgeon: [name], MD;  Location: [facility];  Service: General;  Laterality: N/A;  . TONSILLECTOMY      Outpatient Medications Prior to Visit  Medication Sig Dispense Refill  . ALPRAZolam (XANAX) 0.5 MG tablet Take 1 tablet (0.5 mg total) by mouth every 8 (eight) hours as needed for sleep or anxiety. 30 tablet 5  . amLODipine (NORVASC) 5 MG tablet TAKE ONE TABLET BY MOUTH ONCE DAILY 90 tablet 1  . aspirin EC 81 MG tablet Take 81 mg by mouth daily.    . benazepril (LOTENSIN) 40 MG tablet TAKE ONE TABLET BY MOUTH ONCE DAILY IN THE MORNING 90 tablet 1  . carvedilol (COREG) 25 MG tablet TAKE ONE TABLET BY MOUTH TWICE DAILY WITH MEALS 180 tablet 1  . glucose blood (BAYER CONTOUR NEXT TEST) test strip 1 each by Other route daily. And lancets qd 100 each 12  . Insulin Pen Needle 31G X 5 MM MISC Use with Victoza as directed 100 each 11  . liraglutide (VICTOZA) 18 MG/3ML  SOPN Inject 0.2 mLs (1.2 mg total) into the skin daily. 15 mL 3  . LYRICA 150 MG capsule TAKE ONE CAPSULE BY MOUTH TWICE DAILY 60 capsule 5  . metFORMIN (GLUCOPHAGE-XR) 500 MG 24 hr tablet TAKE ONE TABLET BY MOUTH TWICE DAILY 180 tablet 1  . Omega-3 Fatty Acids (FISH OIL) 500 MG CAPS Take 1 capsule by mouth 2 (two) times daily.    Marland Kitchen testosterone cypionate (DEPOTESTOSTERONE CYPIONATE) 200 MG/ML injection INJECT 1 ML INTO THE MUSCLE EVERY 14 DAYS 10 mL 0  . zaleplon (SONATA) 5 MG capsule Take 1 capsule (5 mg total) by mouth at bedtime as needed for sleep. 90 capsule 1  . predniSONE (DELTASONE) 20 MG tablet 2 tabs po qd x 5d, then 1 tab po qd x 5d (Patient not taking: Reported on 06/22/2016) 15 tablet 0   No facility-administered medications prior to visit.     No Known Allergies  ROS As per HPI  PE: Blood pressure 112/71,  pulse 82, temperature 98.3 F (36.8 C), temperature source Oral, resp. rate 16, height 6' (1.829 m), weight 279 lb (126.6 kg), SpO2 94 %. Gen: Alert, well appearing.  Patient is oriented to person, place, time, and situation. AFFECT: pleasant, lucid thought and speech. CV: RRR, no m/r/g.   LUNGS: CTA bilat, nonlabored resps, good aeration in all lung fields. EXT: no clubbing, cyanosis, or edema.  Foot exam - bilateral normal; no swelling, tenderness or skin or vascular lesions. Color and temperature is normal. Sensation is intact. Peripheral pulses are palpable. Toenails are normal.  LABS:  Lab Results  Component Value Date   TSH 0.46 05/25/2015   Lab Results  Component Value Date   WBC 5.9 12/23/2015   HGB 11.2 (L) 12/23/2015   HCT 35.6 (L) 12/23/2015   MCV 73.6 (L) 12/23/2015   PLT 207 12/23/2015   Lab Results  Component Value Date   IRON 26 (L) 12/23/2015   TIBC 400 12/23/2015   FERRITIN 19.8 (L) 06/05/2011    Lab Results  Component Value Date   CREATININE 0.92 02/03/2015   BUN 15 02/03/2015   NA 137 02/03/2015   K 4.4 02/03/2015   CL 100 02/03/2015   CO2 29 02/03/2015   Lab Results  Component Value Date   ALT 21 02/03/2015   AST 17 02/03/2015   ALKPHOS 83 02/03/2015   BILITOT 0.6 02/03/2015   Lab Results  Component Value Date   CHOL 139 07/24/2014   Lab Results  Component Value Date   HDL 29.60 (L) 07/24/2014   Lab Results  Component Value Date   LDLCALC 71 07/24/2014   Lab Results  Component Value Date   TRIG 190.0 (H) 07/24/2014   Lab Results  Component Value Date   CHOLHDL 5 07/24/2014   Lab Results  Component Value Date   PSA 1.17 04/28/2015   Lab Results  Component Value Date   HGBA1C 7.5 (H) 04/28/2015   Lab Results  Component Value Date   TESTOSTERONE 529 12/23/2015   Lab Results  Component Value Date   VITAMINB12 1,107 (H) 02/26/2014   IMPRESSION AND PLAN:  DM 2: noncompliant with home monitoring and diet. Recent partial  noncompliance with med (victoza). Feet exam normal today. Urine microalb/cr ordered. Needs pneumovax--pt declines. He'll return for HbA1c ASAP.  2) Hyperlipidemia: due for FLP--ordered future.  Currently not on statin.  3) Male hypogonadism: needs PSA monitoring today.  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 Encouraged pt to take his iron and vit B12 supplements DAILY.  5) Chronic L LBP with L leg radiculopathy: I encouraged pt to return to Dr. Maia Petties for further evaluation and management. He is unable to afford PT, plus he says it did not help in the past.  An After Visit Summary was printed and given to the patient.  FOLLOW UP: Return in about 3 months (around 09/22/2016) for annual CPE (fasting)--also needs fasting lab appt ASAP.  Signed:  Crissie Sickles, MD           06/22/2016

## 2016-06-22 NOTE — Patient Instructions (Addendum)
Avoid triamcinolone cream for your rash.  Use over the counter, generic lamisil cream--apply twice a day to rash.  Make a follow up appointment with Dr. Maia Petties for your back and L leg pain [phone]).

## 2016-06-23 ENCOUNTER — Other Ambulatory Visit (INDEPENDENT_AMBULATORY_CARE_PROVIDER_SITE_OTHER): Payer: BLUE CROSS/BLUE SHIELD

## 2016-06-23 DIAGNOSIS — D508 Other iron deficiency anemias: Secondary | ICD-10-CM | POA: Diagnosis not present

## 2016-06-23 DIAGNOSIS — E7800 Pure hypercholesterolemia, unspecified: Secondary | ICD-10-CM

## 2016-06-23 DIAGNOSIS — E291 Testicular hypofunction: Secondary | ICD-10-CM

## 2016-06-23 DIAGNOSIS — E119 Type 2 diabetes mellitus without complications: Secondary | ICD-10-CM | POA: Diagnosis not present

## 2016-06-23 DIAGNOSIS — D509 Iron deficiency anemia, unspecified: Secondary | ICD-10-CM | POA: Diagnosis not present

## 2016-06-23 DIAGNOSIS — K9089 Other intestinal malabsorption: Secondary | ICD-10-CM | POA: Diagnosis not present

## 2016-06-23 LAB — CBC WITH DIFFERENTIAL/PLATELET
Basophils Absolute: 0 10*3/uL (ref 0.0–0.1)
Basophils Relative: 0.7 % (ref 0.0–3.0)
EOS PCT: 1.8 % (ref 0.0–5.0)
Eosinophils Absolute: 0.1 10*3/uL (ref 0.0–0.7)
HCT: 44.8 % (ref 39.0–52.0)
HEMOGLOBIN: 15.4 g/dL (ref 13.0–17.0)
Lymphocytes Relative: 31 % (ref 12.0–46.0)
Lymphs Abs: 1.3 10*3/uL (ref 0.7–4.0)
MCHC: 34.3 g/dL (ref 30.0–36.0)
MCV: 87.9 fl (ref 78.0–100.0)
MONO ABS: 0.5 10*3/uL (ref 0.1–1.0)
MONOS PCT: 12.3 % — AB (ref 3.0–12.0)
Neutro Abs: 2.3 10*3/uL (ref 1.4–7.7)
Neutrophils Relative %: 54.2 % (ref 43.0–77.0)
Platelets: 193 10*3/uL (ref 150.0–400.0)
RBC: 5.1 Mil/uL (ref 4.22–5.81)
RDW: 13.7 % (ref 11.5–15.5)
WBC: 4.3 10*3/uL (ref 4.0–10.5)

## 2016-06-23 LAB — IRON AND TIBC
%SAT: 26 % (ref 15–60)
Iron: 107 ug/dL (ref 50–180)
TIBC: 411 ug/dL (ref 250–425)
UIBC: 304 ug/dL

## 2016-06-23 LAB — LDL CHOLESTEROL, DIRECT: Direct LDL: 75 mg/dL

## 2016-06-23 LAB — LIPID PANEL
CHOL/HDL RATIO: 5
CHOLESTEROL: 141 mg/dL (ref 0–200)
HDL: 28 mg/dL — ABNORMAL LOW (ref >39.00)
NONHDL: 112.78
Triglycerides: 252 mg/dL — ABNORMAL HIGH (ref 0.0–149.0)
VLDL: 50.4 mg/dL — ABNORMAL HIGH (ref 0.0–40.0)

## 2016-06-23 LAB — HEMOGLOBIN A1C: HEMOGLOBIN A1C: 7.1 % — AB (ref 4.6–6.5)

## 2016-06-23 LAB — VITAMIN B12: Vitamin B-12: 1443 pg/mL — ABNORMAL HIGH (ref 211–911)

## 2016-06-23 LAB — PSA: PSA: 1.75 ng/mL (ref 0.10–4.00)

## 2016-06-23 LAB — FERRITIN: FERRITIN: 31.4 ng/mL (ref 22.0–322.0)

## 2016-06-23 LAB — IRON: Iron: 105 ug/dL (ref 42–165)

## 2016-06-26 ENCOUNTER — Encounter: Payer: Self-pay | Admitting: *Deleted

## 2016-06-26 ENCOUNTER — Other Ambulatory Visit: Payer: Self-pay | Admitting: Family Medicine

## 2016-06-26 DIAGNOSIS — I10 Essential (primary) hypertension: Secondary | ICD-10-CM

## 2016-06-27 ENCOUNTER — Other Ambulatory Visit: Payer: Self-pay | Admitting: Family Medicine

## 2016-06-27 MED ORDER — ATORVASTATIN CALCIUM 40 MG PO TABS: 40.0000 mg | ORAL_TABLET | Freq: Every day | ORAL | 3 refills | Status: DC

## 2016-06-27 NOTE — Telephone Encounter (Signed)
SW pts see lab note.

## 2016-07-17 ENCOUNTER — Encounter: Payer: Self-pay | Admitting: Family Medicine

## 2016-07-20 IMAGING — CR DG LUMBAR SPINE COMPLETE 4+V
5 series · 5 of 5 positions shown · non-contrast
Comparison: None.

CLINICAL DATA: Acute low back pain and left lower extremity
numbness after bending injury 4 days ago. Initial encounter.

EXAM:
LUMBAR SPINE - COMPLETE 4+ VIEW

[t l-spine a.p.]
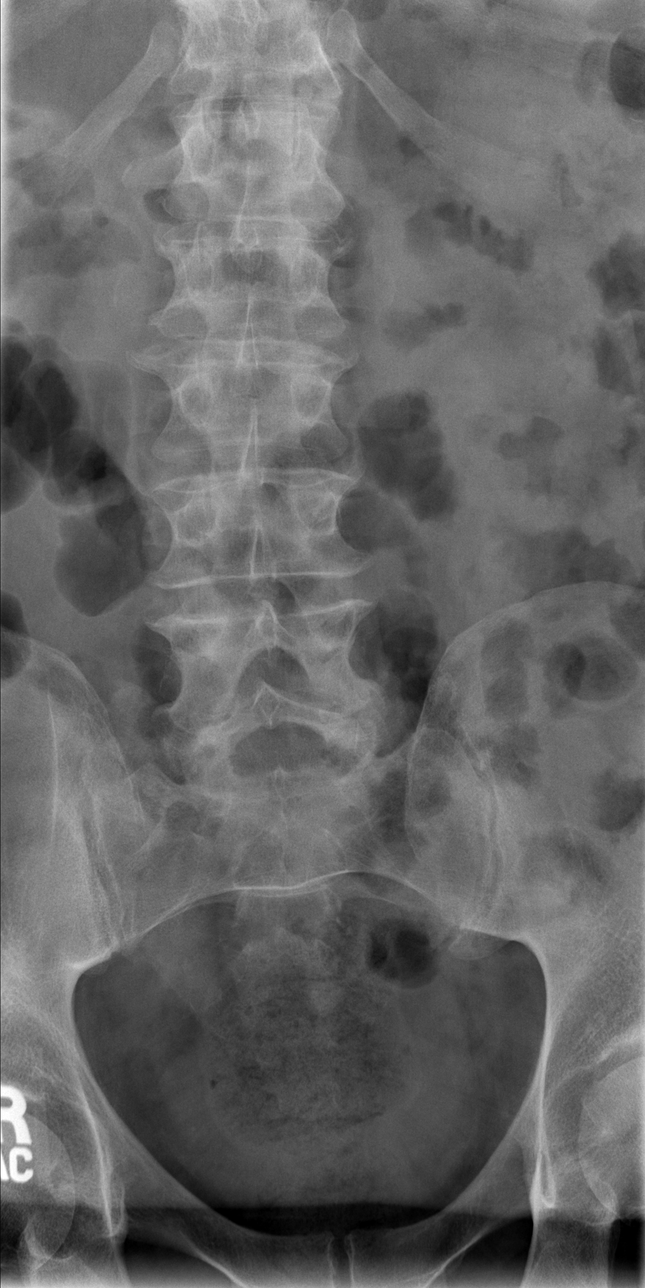

[t l-spine oblique exposure (1 of 2)]
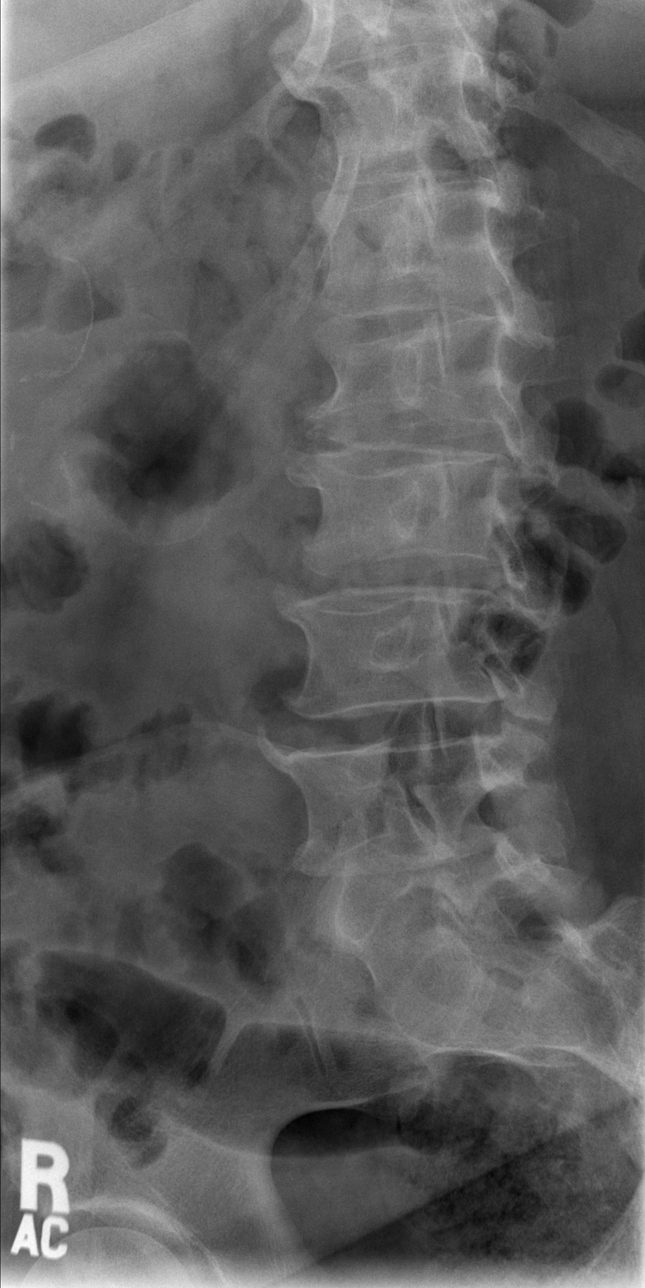

[t l-spine oblique exposure (2 of 2)]
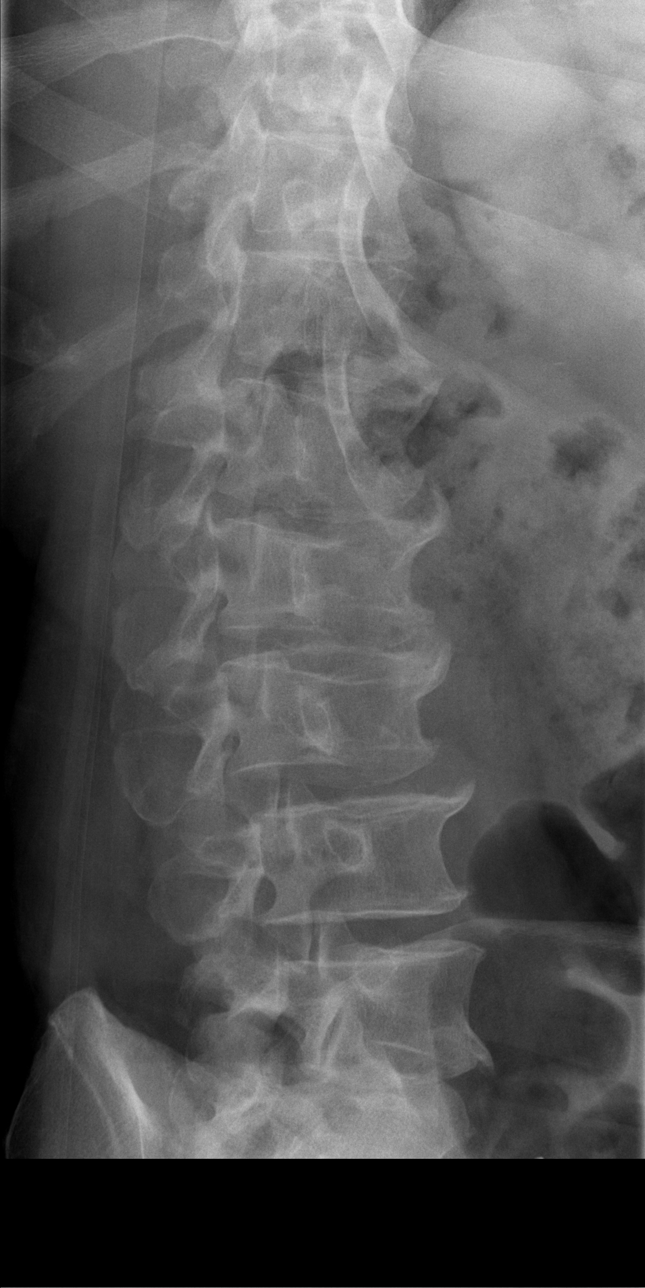

[t l-spine lat]
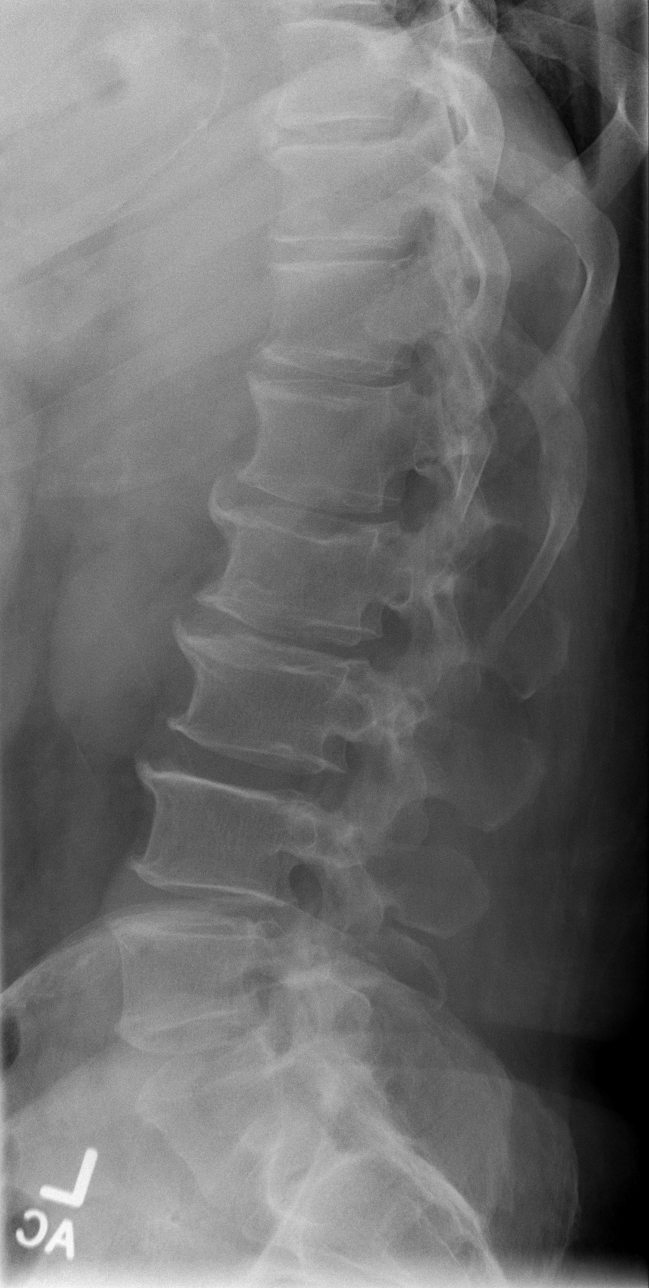

[t l-spine l5-s1 spot]
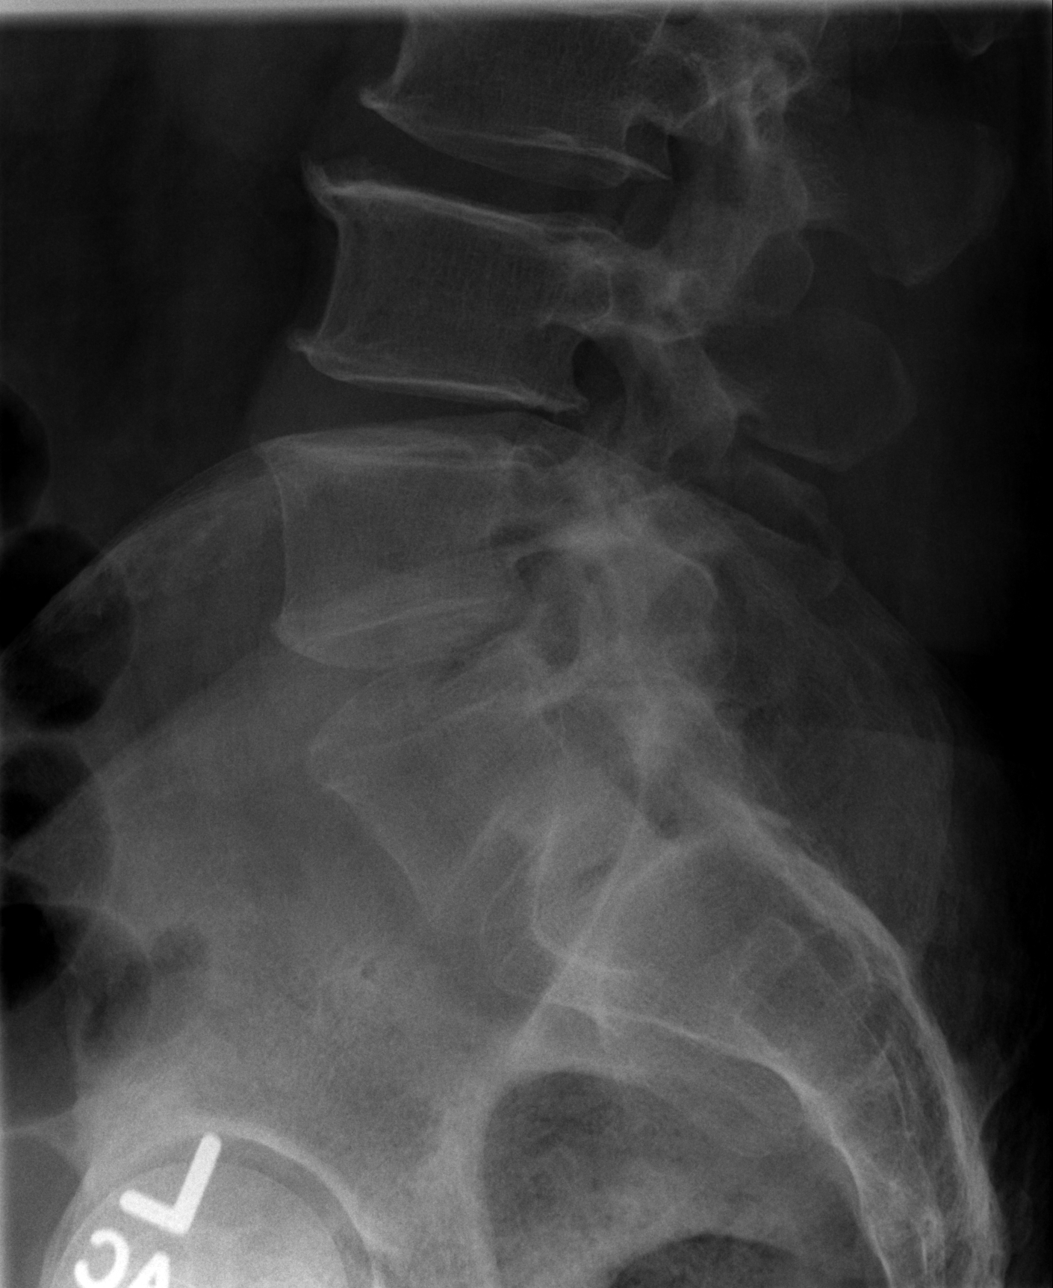

[5 of 5 positions shown; findings below may reference images not displayed]

FINDINGS: No fracture is noted. Mild grade 1 retrolisthesis of L4-5 is noted
secondary to mild degenerative disc disease at this level. Mild to
moderate anterior osteophyte formation is noted at L1-2, L2-3 and
L3-4. Posterior facet joints appear normal.
IMPRESSION: Mild multilevel degenerative disc disease is noted. No acute
abnormality seen in the lumbar spine.

## 2016-07-21 ENCOUNTER — Encounter: Payer: Self-pay | Admitting: Family Medicine

## 2016-07-21 LAB — HM DIABETES EYE EXAM

## 2016-08-25 ENCOUNTER — Other Ambulatory Visit: Payer: Self-pay | Admitting: Family Medicine

## 2016-08-28 NOTE — Telephone Encounter (Signed)
Rx faxed

## 2016-08-28 NOTE — Telephone Encounter (Signed)
[facility]  RF request for alprazolam LOV: 06/22/16 Next ov: 09/21/16 Last written: 10/21/15 #30 w/ 5RF  Please advise. Thanks.

## 2016-09-11 ENCOUNTER — Institutional Professional Consult (permissible substitution): Payer: BLUE CROSS/BLUE SHIELD | Admitting: Internal Medicine

## 2016-09-21 ENCOUNTER — Encounter: Payer: Self-pay | Admitting: Family Medicine

## 2016-09-21 ENCOUNTER — Ambulatory Visit (INDEPENDENT_AMBULATORY_CARE_PROVIDER_SITE_OTHER): Payer: Commercial Managed Care - PPO | Admitting: Family Medicine

## 2016-09-21 VITALS — BP 148/77 | HR 75 | Temp 97.9°F | Resp 16 | Ht 72.0 in | Wt 284.5 lb

## 2016-09-21 DIAGNOSIS — Z0000 Encounter for general adult medical examination without abnormal findings: Secondary | ICD-10-CM | POA: Diagnosis not present

## 2016-09-21 DIAGNOSIS — I10 Essential (primary) hypertension: Secondary | ICD-10-CM

## 2016-09-21 DIAGNOSIS — Z1329 Encounter for screening for other suspected endocrine disorder: Secondary | ICD-10-CM | POA: Diagnosis not present

## 2016-09-21 DIAGNOSIS — E119 Type 2 diabetes mellitus without complications: Secondary | ICD-10-CM

## 2016-09-21 LAB — COMPREHENSIVE METABOLIC PANEL
ALT: 25 U/L (ref 0–53)
AST: 20 U/L (ref 0–37)
Albumin: 4.1 g/dL (ref 3.5–5.2)
Alkaline Phosphatase: 65 U/L (ref 39–117)
BUN: 13 mg/dL (ref 6–23)
CHLORIDE: 104 meq/L (ref 96–112)
CO2: 28 meq/L (ref 19–32)
CREATININE: 0.98 mg/dL (ref 0.40–1.50)
Calcium: 9.4 mg/dL (ref 8.4–10.5)
GFR: 84.46 mL/min (ref >60.00)
Glucose, Bld: 268 mg/dL — ABNORMAL HIGH (ref 70–99)
Potassium: 4.2 mEq/L (ref 3.5–5.1)
SODIUM: 139 meq/L (ref 135–145)
Total Bilirubin: 0.5 mg/dL (ref 0.2–1.2)
Total Protein: 7.1 g/dL (ref 6.0–8.3)

## 2016-09-21 LAB — TSH: TSH: 0.56 u[IU]/mL (ref 0.35–4.50)

## 2016-09-21 LAB — HEMOGLOBIN A1C: HEMOGLOBIN A1C: 6.9 % — AB (ref 4.6–6.5)

## 2016-09-21 NOTE — Progress Notes (Signed)
Office Note 09/21/2016  CC:  Chief Complaint  Patient presents with  . Annual Exam    Pt is not fasting.     HPI:  Ethan Pitts is a 56 y.o. White male who is here for annual health maintenance exam.  Eyes: examined last month. Dental: preventatives q 60mo. Exercise: "work", no formal exercise. Diet: had f/u with his gastric bypass surgeon.  Says he is always trying to lose wt by eating less. Says his surgeon will consider rx'ing appetite suppressant if wt not better in future.  Rash on back, comes and goes over the last several years, itchy sometimes.  He occ applies otc lamisil but he cannot reach a lot of the areas.  This helps but he only applies it occasionally.   Past Medical History:  Diagnosis Date  . Anxiety   . Arthritis    lower back   . Depression   . Diabetes mellitus   . GERD (gastroesophageal reflux disease)   . Hyperlipidemia   . Hypertension   . Hypogonadism, male 05/2015  . Low back pain 2016   Left lumbar radiculopathy, lumbar spondylolisthesis.  Sciatica episode 03/2016.  Ortho getting L spine MRI as of 07/2016.  . Microcytic anemia 12/2015   suspect iron def due to malabsorption.  Hemoccults NEG 01/26/16, ferrous sulfate started.  . Neuropathic pain of left foot   . OSA on CPAP    cpap settings at 3    Past Surgical History:  Procedure Laterality Date  . ADENOIDECTOMY    . BREATH TEK H PYLORI N/A 01/08/2013   Procedure: BREATH TEK H PYLORI;  Surgeon: [name], MD;  Location: [facility];  Service: General;  Laterality: N/A;  . COLONOSCOPY  04/17/2014   Benign polyp x 1.  Recall 10 yrs.  Marland Kitchen GASTRIC ROUX-EN-Y N/A 04/14/2013   Procedure: LAPAROSCOPIC ROUX-EN-Y GASTRIC BYPASS WITH UPPER ENDOSCOPY;  Surgeon: [name], MD;  Location: [facility];  Service: General;  Laterality: N/A;  . TONSILLECTOMY      Family History  Problem Relation Age of Onset  . Heart disease Father        valvular disease  . COPD Father   . Prostate  cancer Father   . Arrhythmia Father   . Colon cancer Neg Hx     Social History   Social History  . Marital status: Single    Spouse name: N/A  . Number of children: N/A  . Years of education: N/A   Occupational History  . [place] History Main Topics  . Smoking status: Never Smoker  . Smokeless tobacco: Former Systems developer    Types: Chew    Quit date: 02/06/2010  . Alcohol use 7.2 oz/week    12 Cans of beer per week     Comment:  12pk/per week  . Drug use: No  . Sexual activity: Not on file   Other Topics Concern  . Not on file   Social History Narrative   Married, no children.   Occupation: paramedic    Outpatient Medications Prior to Visit  Medication Sig Dispense Refill  . ALPRAZolam (XANAX) 0.5 MG tablet TAKE ONE TABLET BY MOUTH EVERY 8 HOURS AS NEEDED FOR  SLEEP  OR  ANXIETY 30 tablet 5  . amLODipine (NORVASC) 5 MG tablet TAKE ONE TABLET BY MOUTH ONCE DAILY 90 tablet 0  . aspirin EC 81 MG tablet Take 81 mg by mouth daily.    Marland Kitchen atorvastatin (  LIPITOR) 40 MG tablet Take 1 tablet (40 mg total) by mouth daily. 90 tablet 3  . benazepril (LOTENSIN) 40 MG tablet TAKE ONE TABLET BY MOUTH ONCE DAILY IN THE MORNING 90 tablet 1  . carvedilol (COREG) 25 MG tablet TAKE ONE TABLET BY MOUTH TWICE DAILY WITH MEALS 180 tablet 1  . glucose blood (BAYER CONTOUR NEXT TEST) test strip 1 each by Other route daily. And lancets qd 100 each 12  . Insulin Pen Needle 31G X 5 MM MISC Use with Victoza as directed 100 each 11  . liraglutide (VICTOZA) 18 MG/3ML SOPN Inject 0.2 mLs (1.2 mg total) into the skin daily. 15 mL 3  . LYRICA 150 MG capsule TAKE ONE CAPSULE BY MOUTH TWICE DAILY 60 capsule 5  . metFORMIN (GLUCOPHAGE-XR) 500 MG 24 hr tablet TAKE ONE TABLET BY MOUTH TWICE DAILY 180 tablet 1  . Omega-3 Fatty Acids (FISH OIL) 500 MG CAPS Take 1 capsule by mouth 2 (two) times daily.    Marland Kitchen testosterone cypionate (DEPOTESTOSTERONE CYPIONATE) 200 MG/ML injection INJECT 1  ML INTO THE MUSCLE EVERY 14 DAYS 10 mL 0  . zaleplon (SONATA) 5 MG capsule Take 1 capsule (5 mg total) by mouth at bedtime as needed for sleep. 90 capsule 1   No facility-administered medications prior to visit.     No Known Allergies  ROS Review of Systems  Constitutional: Negative for appetite change, chills, fatigue and fever.  HENT: Negative for congestion, dental problem, ear pain and sore throat.   Eyes: Negative for discharge, redness and visual disturbance.  Respiratory: Negative for cough, chest tightness, shortness of breath and wheezing.   Cardiovascular: Negative for chest pain, palpitations and leg swelling.  Gastrointestinal: Negative for abdominal pain, blood in stool, diarrhea, nausea and vomiting.  Genitourinary: Negative for difficulty urinating, dysuria, flank pain, frequency, hematuria and urgency.  Musculoskeletal: Negative for arthralgias, back pain, joint swelling, myalgias and neck stiffness.  Skin: Positive for rash (back and shoulders with chronic tinea versicolor rash). Negative for pallor.  Neurological: Negative for dizziness, speech difficulty, weakness and headaches.  Hematological: Negative for adenopathy. Does not bruise/bleed easily.  Psychiatric/Behavioral: Negative for confusion and sleep disturbance. The patient is not nervous/anxious.     PE; Blood pressure (!) 148/77, pulse 75, temperature 97.9 F (36.6 C), temperature source Oral, resp. rate 16, height 6' (1.829 m), weight 284 lb 8 oz (129 kg), SpO2 94 %. Gen: Alert, well appearing.  Patient is oriented to person, place, time, and situation. AFFECT: pleasant, lucid thought and speech. ENT: Ears: EACs clear, normal epithelium.  TMs with good light reflex and landmarks bilaterally.  Eyes: no injection, icteris, swelling, or exudate.  EOMI, PERRLA. Nose: no drainage or turbinate edema/swelling.  No injection or focal lesion.  Mouth: lips without lesion/swelling.  Oral mucosa pink and moist.   Dentition intact and without obvious caries or gingival swelling.  Oropharynx without erythema, exudate, or swelling.  Neck: supple/nontender.  No LAD, mass, or TM.  Carotid pulses 2+ bilaterally, without bruits. CV: RRR, no m/r/g.   LUNGS: CTA bilat, nonlabored resps, good aeration in all lung fields. ABD: soft, NT, ND, BS normal.  No hepatospenomegaly or mass.  No bruits. EXT: no clubbing, cyanosis, or edema.  Musculoskeletal: no joint swelling, erythema, warmth, or tenderness.  ROM of all joints intact. Skin - no sores or suspicious lesions.   Shoulders and back with mild hyperpigmented discoloration in circular pattern, mild flakiness, some raised borders. Rectal exam: negative without mass, lesions  or tenderness, PROSTATE EXAM: smooth and symmetric without nodules or tenderness.   Pertinent labs:  Lab Results  Component Value Date   TESTOSTERONE 529 12/23/2015    Lab Results  Component Value Date   TSH 0.46 05/25/2015   Lab Results  Component Value Date   WBC 4.3 06/23/2016   HGB 15.4 06/23/2016   HCT 44.8 06/23/2016   MCV 87.9 06/23/2016   PLT 193.0 06/23/2016   Lab Results  Component Value Date   CREATININE 0.92 02/03/2015   BUN 15 02/03/2015   NA 137 02/03/2015   K 4.4 02/03/2015   CL 100 02/03/2015   CO2 29 02/03/2015   Lab Results  Component Value Date   ALT 21 02/03/2015   AST 17 02/03/2015   ALKPHOS 83 02/03/2015   BILITOT 0.6 02/03/2015   Lab Results  Component Value Date   CHOL 141 06/23/2016   Lab Results  Component Value Date   HDL 28.00 (L) 06/23/2016   Lab Results  Component Value Date   LDLCALC 71 07/24/2014   Lab Results  Component Value Date   TRIG 252.0 (H) 06/23/2016   Lab Results  Component Value Date   CHOLHDL 5 06/23/2016   Lab Results  Component Value Date   PSA 1.75 06/23/2016   PSA 1.17 04/28/2015   Lab Results  Component Value Date   HGBA1C 7.1 (H) 06/23/2016    ASSESSMENT AND PLAN:   Health maintenance  exam: Reviewed age and gender appropriate health maintenance issues (prudent diet, regular exercise, health risks of tobacco and excessive alcohol, use of seatbelts, fire alarms in home, use of sunscreen).  Also reviewed age and gender appropriate health screening as well as vaccine recommendations. Vaccines: He has declined pneumovax and also declines flu vaccine today--he gets this free at work. Labs: CMET, TSH (hypoth screening in diabetic), Hba1c (DM 2 monitoring). Prostate ca screening: PSA normal 06/23/16 (done for monitoring for being on testosterone therapy), DRE normal today. Colon ca screening: next colonoscopy due 04/2024.  An After Visit Summary was printed and given to the patient.  FOLLOW UP:  Return in about 3 months (around 12/21/2016) for routine chronic illness f/u.  Signed:  Crissie Sickles, MD           09/21/2016

## 2016-09-21 NOTE — Patient Instructions (Signed)

## 2016-09-22 ENCOUNTER — Encounter: Payer: Self-pay | Admitting: *Deleted

## 2016-09-23 ENCOUNTER — Other Ambulatory Visit: Payer: Self-pay | Admitting: Family Medicine

## 2016-09-23 DIAGNOSIS — I10 Essential (primary) hypertension: Secondary | ICD-10-CM

## 2016-10-26 ENCOUNTER — Other Ambulatory Visit: Payer: Self-pay | Admitting: Family Medicine

## 2016-10-27 ENCOUNTER — Other Ambulatory Visit: Payer: Self-pay | Admitting: Family Medicine

## 2016-10-27 NOTE — Telephone Encounter (Signed)
Rx faxed

## 2016-10-27 NOTE — Telephone Encounter (Signed)
[facility]  RF request for Lyrica LOV: 09/21/16 Next ov: 12/21/16 Last written: 04/24/16 #60 w/ 5RF  Please advise. Thanks.

## 2016-11-15 ENCOUNTER — Other Ambulatory Visit: Payer: Self-pay | Admitting: Family Medicine

## 2016-11-28 ENCOUNTER — Other Ambulatory Visit: Payer: Self-pay | Admitting: Family Medicine

## 2016-11-28 NOTE — Telephone Encounter (Signed)
[facility]  RF request for testosterone LOV: 09/21/16 Next ov: 12/21/16 Last written: 02/08/16 #57ml w/ 0RF  Please advise. Thanks.

## 2016-11-29 NOTE — Telephone Encounter (Signed)
Rx faxed

## 2016-12-07 ENCOUNTER — Other Ambulatory Visit: Payer: Self-pay | Admitting: Family Medicine

## 2016-12-07 NOTE — Telephone Encounter (Signed)
[facility]  RF request for Sonata LOV: 09/21/16 Next ov: 12/21/16 Last written: 03/27/16 #90 w/ 1RF  Please advise. Thanks.

## 2016-12-07 NOTE — Telephone Encounter (Signed)
Rx faxed

## 2016-12-12 ENCOUNTER — Other Ambulatory Visit: Payer: Self-pay | Admitting: Family Medicine

## 2016-12-18 ENCOUNTER — Other Ambulatory Visit: Payer: Self-pay | Admitting: Family Medicine

## 2016-12-18 ENCOUNTER — Telehealth: Payer: Self-pay | Admitting: Family Medicine

## 2016-12-18 NOTE — Telephone Encounter (Signed)
Rx refill request

## 2016-12-18 NOTE — Telephone Encounter (Signed)
See Rx request ° °

## 2016-12-18 NOTE — Telephone Encounter (Signed)
Rx was faxed on 12/07/16. SW Tiffany at [facility] and she stated that they did not receive fax. She transferred me to the pharmacist to give verbal order. Verbal order given as directed.   Pt advised and voiced understanding.

## 2016-12-18 NOTE — Telephone Encounter (Signed)
Copied from [facility] #[id number]. Topic: Quick Communication - See Telephone Encounter >> Dec 18, 2016  9:39 AM Synthia Innocent wrote: CRM for notification. See Telephone encounter for:  Requesting refill on zaleplon (SONATA) 5 MG capsule, [facility], has not received refill. 12/18/16.

## 2016-12-18 NOTE — Telephone Encounter (Signed)
Copied from [place] #[id number]. Topic: Quick Communication - See Telephone Encounter >> Dec 18, 2016  9:39 AM Synthia Innocent wrote: CRM for notification. See Telephone encounter for:  Requesting refill on zaleplon (SONATA) 5 MG capsule, [facility], has not received refill. 12/18/16.

## 2016-12-21 ENCOUNTER — Ambulatory Visit: Payer: Commercial Managed Care - PPO | Admitting: Family Medicine

## 2017-01-02 DIAGNOSIS — R972 Elevated prostate specific antigen [PSA]: Secondary | ICD-10-CM

## 2017-01-02 HISTORY — DX: Elevated prostate specific antigen (PSA): R97.20

## 2017-01-09 ENCOUNTER — Ambulatory Visit: Payer: Commercial Managed Care - PPO | Admitting: Family Medicine

## 2017-01-09 ENCOUNTER — Encounter: Payer: Self-pay | Admitting: Family Medicine

## 2017-01-09 NOTE — Progress Notes (Deleted)
OFFICE VISIT  01/09/2017   CC: No chief complaint on file.    HPI:    Patient is a 57 y.o. Caucasian male who presents for f/u DM 2, HTN, HLD  Past Medical History:  Diagnosis Date  . Anxiety   . Arthritis    lower back   . Depression   . Diabetes mellitus   . GERD (gastroesophageal reflux disease)   . Hyperlipidemia   . Hypertension   . Hypogonadism, male 05/2015  . Low back pain 2016   Left lumbar radiculopathy, lumbar spondylolisthesis.  Sciatica episode 03/2016.  Ortho getting L spine MRI as of 07/2016.  . Microcytic anemia 12/2015   suspect iron def due to malabsorption.  Hemoccults NEG 01/26/16, ferrous sulfate started.  . Neuropathic pain of left foot   . OSA on CPAP    cpap settings at 3    Past Surgical History:  Procedure Laterality Date  . ADENOIDECTOMY    . BREATH TEK H PYLORI N/A 01/08/2013   Procedure: BREATH TEK H PYLORI;  Surgeon: [name], MD;  Location: [facility];  Service: General;  Laterality: N/A;  . COLONOSCOPY  04/17/2014   Benign polyp x 1.  Recall 10 yrs.  Marland Kitchen GASTRIC ROUX-EN-Y N/A 04/14/2013   Procedure: LAPAROSCOPIC ROUX-EN-Y GASTRIC BYPASS WITH UPPER ENDOSCOPY;  Surgeon: [name], MD;  Location: [facility];  Service: General;  Laterality: N/A;  . TONSILLECTOMY      Outpatient Medications Prior to Visit  Medication Sig Dispense Refill  . ALPRAZolam (XANAX) 0.5 MG tablet TAKE ONE TABLET BY MOUTH EVERY 8 HOURS AS NEEDED FOR  SLEEP  OR  ANXIETY 30 tablet 5  . amLODipine (NORVASC) 5 MG tablet TAKE 1 TABLET BY MOUTH ONCE DAILY 90 tablet 2  . aspirin EC 81 MG tablet Take 81 mg by mouth daily.    Marland Kitchen atorvastatin (LIPITOR) 40 MG tablet Take 1 tablet (40 mg total) by mouth daily. 90 tablet 3  . benazepril (LOTENSIN) 40 MG tablet TAKE 1 TABLET BY MOUTH ONCE DAILY IN THE MORNING 90 tablet 1  . carvedilol (COREG) 25 MG tablet TAKE 1 TABLET BY MOUTH TWICE DAILY WITH MEALS 180 tablet 1  . glucose blood (BAYER CONTOUR NEXT TEST) test strip 1 each  by Other route daily. And lancets qd 100 each 12  . Insulin Pen Needle 31G X 5 MM MISC Use with Victoza as directed 100 each 11  . liraglutide (VICTOZA) 18 MG/3ML SOPN Inject 0.2 mLs (1.2 mg total) into the skin daily. 15 mL 3  . LYRICA 150 MG capsule TAKE 1 CAPSULE BY MOUTH TWICE DAILY 60 capsule 5  . metFORMIN (GLUCOPHAGE-XR) 500 MG 24 hr tablet TAKE 1 TABLET BY MOUTH TWICE DAILY 180 tablet 1  . Omega-3 Fatty Acids (FISH OIL) 500 MG CAPS Take 1 capsule by mouth 2 (two) times daily.    Marland Kitchen testosterone cypionate (DEPOTESTOSTERONE CYPIONATE) 200 MG/ML injection INJECT 1 ML INTO THE MUSCLE EVERY 14 DAYS 10 mL 0  . zaleplon (SONATA) 5 MG capsule TAKE 1 CAPSULE BY MOUTH AT BEDTIME AS NEEDED FOR SLEEP 90 capsule 1   No facility-administered medications prior to visit.     No Known Allergies  ROS As per HPI  PE: There were no vitals taken for this visit. ***  LABS:  Lab Results  Component Value Date   TSH 0.56 09/21/2016   Lab Results  Component Value Date   WBC 4.3 06/23/2016   HGB 15.4 06/23/2016  HCT 44.8 06/23/2016   MCV 87.9 06/23/2016   PLT 193.0 06/23/2016   Lab Results  Component Value Date   CREATININE 0.98 09/21/2016   BUN 13 09/21/2016   NA 139 09/21/2016   K 4.2 09/21/2016   CL 104 09/21/2016   CO2 28 09/21/2016   Lab Results  Component Value Date   ALT 25 09/21/2016   AST 20 09/21/2016   ALKPHOS 65 09/21/2016   BILITOT 0.5 09/21/2016   Lab Results  Component Value Date   CHOL 141 06/23/2016   Lab Results  Component Value Date   HDL 28.00 (L) 06/23/2016   Lab Results  Component Value Date   LDLCALC 71 07/24/2014   Lab Results  Component Value Date   TRIG 252.0 (H) 06/23/2016   Lab Results  Component Value Date   CHOLHDL 5 06/23/2016   Lab Results  Component Value Date   PSA 1.75 06/23/2016   PSA 1.17 04/28/2015   Lab Results  Component Value Date   HGBA1C 6.9 (H) 09/21/2016   Lab Results  Component Value Date   TESTOSTERONE 529  12/23/2015     IMPRESSION AND PLAN:  No problem-specific Assessment & Plan notes found for this encounter.   FOLLOW UP: No Follow-up on file.

## 2017-01-23 ENCOUNTER — Other Ambulatory Visit: Payer: Self-pay

## 2017-01-23 MED ORDER — TESTOSTERONE CYPIONATE 200 MG/ML IM SOLN: INTRAMUSCULAR | 0 refills | Status: DC

## 2017-01-23 NOTE — Telephone Encounter (Signed)
Patient notified and stated that he will call back next week to schedule an appointment.

## 2017-01-23 NOTE — Telephone Encounter (Signed)
Will RF testosterone. He needs routine diabetes, high blood pressure, and low testosterone follow up in 1-2 months-thx

## 2017-02-23 ENCOUNTER — Encounter: Payer: Self-pay | Admitting: Family Medicine

## 2017-02-23 ENCOUNTER — Ambulatory Visit: Payer: Commercial Managed Care - PPO | Admitting: Family Medicine

## 2017-02-23 ENCOUNTER — Ambulatory Visit (INDEPENDENT_AMBULATORY_CARE_PROVIDER_SITE_OTHER): Payer: Commercial Managed Care - PPO | Admitting: Family Medicine

## 2017-02-23 VITALS — BP 155/82 | HR 62 | Temp 97.5°F | Resp 16 | Ht 72.0 in | Wt 288.8 lb

## 2017-02-23 DIAGNOSIS — N3000 Acute cystitis without hematuria: Secondary | ICD-10-CM | POA: Diagnosis not present

## 2017-02-23 DIAGNOSIS — R300 Dysuria: Secondary | ICD-10-CM | POA: Diagnosis not present

## 2017-02-23 DIAGNOSIS — E291 Testicular hypofunction: Secondary | ICD-10-CM

## 2017-02-23 DIAGNOSIS — E7800 Pure hypercholesterolemia, unspecified: Secondary | ICD-10-CM

## 2017-02-23 DIAGNOSIS — E119 Type 2 diabetes mellitus without complications: Secondary | ICD-10-CM | POA: Diagnosis not present

## 2017-02-23 DIAGNOSIS — N410 Acute prostatitis: Secondary | ICD-10-CM

## 2017-02-23 LAB — POC URINALSYSI DIPSTICK (AUTOMATED)
BILIRUBIN UA: NEGATIVE
Ketones, UA: NEGATIVE
Nitrite, UA: NEGATIVE
SPEC GRAV UA: 1.025 (ref 1.010–1.025)
Urobilinogen, UA: 0.2 E.U./dL
pH, UA: 6 (ref 5.0–8.0)

## 2017-02-23 MED ORDER — SULFAMETHOXAZOLE-TRIMETHOPRIM 800-160 MG PO TABS: 1.0000 | ORAL_TABLET | Freq: Two times a day (BID) | ORAL | 0 refills | Status: DC

## 2017-02-23 NOTE — Addendum Note (Signed)
Addended by: Tammi Sou on: 02/23/2017 03:54 PM   Modules accepted: Orders, Level of Service

## 2017-02-23 NOTE — Addendum Note (Signed)
Addended by: Tammi Sou on: 02/23/2017 04:16 PM   Modules accepted: Orders

## 2017-02-23 NOTE — Progress Notes (Addendum)
OFFICE VISIT  02/23/2017   CC:  Chief Complaint  Patient presents with  . Dysuria   HPI:    Patient is a 57 y.o. Caucasian male who presents for "pain during urination". Onset about 2 weeks ago, trouble getting urine started, burning with urination, dribbling, sense of incomplete emptying, urinary frequency.  Sometimes the pain is severe.  NO blood seen in urine. No flank or groin pain.   Last week had some retropubic discomfort.  No fevers.  His baseline: no urinary difficulties. No new/recent sexual partner.   No hx of prostatitis or UTI in the past.  At the end of his visit today he asked if he could have his labs drawn for f/u chronic illness. He no showed his most recent f/u visit, also cancelled once since last f/u. I agreed to do labs today but asked him to return for o/v to go over chronic illnesses mgmt. His most recent testost injection was 5 d/a.   Past Medical History:  Diagnosis Date  . Anxiety   . Arthritis    lower back   . Depression   . Diabetes mellitus   . GERD (gastroesophageal reflux disease)   . Hyperlipidemia   . Hypertension   . Hypogonadism, male 05/2015  . Low back pain 2016   Left lumbar radiculopathy, lumbar spondylolisthesis.  Sciatica episode 03/2016.  Ortho getting L spine MRI as of 07/2016.  . Microcytic anemia 12/2015   suspect iron def due to malabsorption.  Hemoccults NEG 01/26/16, ferrous sulfate started.  . Neuropathic pain of left foot   . OSA on CPAP    cpap settings at 3    Past Surgical History:  Procedure Laterality Date  . ADENOIDECTOMY    . BREATH TEK H PYLORI N/A 01/08/2013   Procedure: BREATH TEK H PYLORI;  Surgeon: [name], MD;  Location: [facility];  Service: General;  Laterality: N/A;  . COLONOSCOPY  04/17/2014   Benign polyp x 1.  Recall 10 yrs.  Marland Kitchen GASTRIC ROUX-EN-Y N/A 04/14/2013   Procedure: LAPAROSCOPIC ROUX-EN-Y GASTRIC BYPASS WITH UPPER ENDOSCOPY;  Surgeon: [name], MD;  Location: [facility];   Service: General;  Laterality: N/A;  . TONSILLECTOMY      Outpatient Medications Prior to Visit  Medication Sig Dispense Refill  . ALPRAZolam (XANAX) 0.5 MG tablet TAKE ONE TABLET BY MOUTH EVERY 8 HOURS AS NEEDED FOR  SLEEP  OR  ANXIETY 30 tablet 5  . amLODipine (NORVASC) 5 MG tablet TAKE 1 TABLET BY MOUTH ONCE DAILY 90 tablet 2  . aspirin EC 81 MG tablet Take 81 mg by mouth daily.    Marland Kitchen atorvastatin (LIPITOR) 40 MG tablet Take 1 tablet (40 mg total) by mouth daily. 90 tablet 3  . benazepril (LOTENSIN) 40 MG tablet TAKE 1 TABLET BY MOUTH ONCE DAILY IN THE MORNING 90 tablet 1  . carvedilol (COREG) 25 MG tablet TAKE 1 TABLET BY MOUTH TWICE DAILY WITH MEALS 180 tablet 1  . glucose blood (BAYER CONTOUR NEXT TEST) test strip 1 each by Other route daily. And lancets qd 100 each 12  . Insulin Pen Needle 31G X 5 MM MISC Use with Victoza as directed 100 each 11  . liraglutide (VICTOZA) 18 MG/3ML SOPN Inject 0.2 mLs (1.2 mg total) into the skin daily. 15 mL 3  . LYRICA 150 MG capsule TAKE 1 CAPSULE BY MOUTH TWICE DAILY 60 capsule 5  . metFORMIN (GLUCOPHAGE-XR) 500 MG 24 hr tablet TAKE 1 TABLET BY  MOUTH TWICE DAILY 180 tablet 1  . Omega-3 Fatty Acids (FISH OIL) 500 MG CAPS Take 1 capsule by mouth 2 (two) times daily.    Marland Kitchen testosterone cypionate (DEPOTESTOSTERONE CYPIONATE) 200 MG/ML injection INJECT 1 ML INTO THE MUSCLE EVERY 14 DAYS 10 mL 0  . zaleplon (SONATA) 5 MG capsule TAKE 1 CAPSULE BY MOUTH AT BEDTIME AS NEEDED FOR SLEEP 90 capsule 1   No facility-administered medications prior to visit.     No Known Allergies  ROS As per HPI  PE: Blood pressure (!) 155/82, pulse 62, temperature (!) 97.5 F (36.4 C), temperature source Oral, resp. rate 16, height 6' (1.829 m), weight 288 lb 12 oz (131 kg), SpO2 96 %. Gen: Alert, well appearing.  Patient is oriented to person, place, time, and situation. AFFECT: pleasant, lucid thought and speech. RECTAL: normal tone, no mass.  Prostate mildly enlarged,  symmetric, without nodule. Mild discomfort with palpation of prostate---it also gives him signif urge to urinate.  LABS:   Lab Results  Component Value Date   HGBA1C 6.9 (H) 09/21/2016    Lab Results  Component Value Date   WBC 4.3 06/23/2016   HGB 15.4 06/23/2016   HCT 44.8 06/23/2016   MCV 87.9 06/23/2016   PLT 193.0 06/23/2016   Lab Results  Component Value Date   PSA 1.75 06/23/2016   PSA 1.17 04/28/2015   Lab Results  Component Value Date   TESTOSTERONE 529 12/23/2015   Lab Results  Component Value Date   CHOL 141 06/23/2016   HDL 28.00 (L) 06/23/2016   LDLCALC 71 07/24/2014   LDLDIRECT 75.0 06/23/2016   TRIG 252.0 (H) 06/23/2016   CHOLHDL 5 06/23/2016    UA today: large leuk, small blood, 100 glucose.  IMPRESSION AND PLAN:  1) Acute prostatitis and cystitis, w/out hematuria. Bactrim DS 1 bid x 14d. Signs/symptoms to call or return for were reviewed and pt expressed understanding. Send urine for c/s.  2) Chronic illness f/u labs: hypogonadism, diabetes, HLD. Check testosterone level, CBC, HbA1c, FLP and BMET (pt is fasting).  An After Visit Summary was printed and given to the patient.  Spent 25 min with pt today, with >50% of this time spent in counseling and care coordination regarding the above problems.  FOLLOW UP: Return in about 4 weeks (around 03/23/2017) for routine chronic illness f/u (30 min).  Signed:  Crissie Sickles, MD           02/23/2017

## 2017-02-24 LAB — CBC
HEMATOCRIT: 42.7 % (ref 38.5–50.0)
HEMOGLOBIN: 14.9 g/dL (ref 13.2–17.1)
MCH: 29.6 pg (ref 27.0–33.0)
MCHC: 34.9 g/dL (ref 32.0–36.0)
MCV: 84.7 fL (ref 80.0–100.0)
MPV: 10.6 fL (ref 7.5–12.5)
Platelets: 204 10*3/uL (ref 140–400)
RBC: 5.04 10*6/uL (ref 4.20–5.80)
RDW: 12.4 % (ref 11.0–15.0)
WBC: 4.5 10*3/uL (ref 3.8–10.8)

## 2017-02-24 LAB — HEMOGLOBIN A1C
EAG (MMOL/L): 10.9 (calc)
Hgb A1c MFr Bld: 8.5 % of total Hgb — ABNORMAL HIGH (ref <5.7)
Mean Plasma Glucose: 197 (calc)

## 2017-02-24 LAB — BASIC METABOLIC PANEL
BUN: 8 mg/dL (ref 7–25)
CHLORIDE: 103 mmol/L (ref 98–110)
CO2: 27 mmol/L (ref 20–32)
CREATININE: 0.86 mg/dL (ref 0.70–1.33)
Calcium: 9.2 mg/dL (ref 8.6–10.3)
Glucose, Bld: 129 mg/dL — ABNORMAL HIGH (ref 65–99)
POTASSIUM: 4.4 mmol/L (ref 3.5–5.3)
SODIUM: 139 mmol/L (ref 135–146)

## 2017-02-24 LAB — LIPID PANEL
CHOL/HDL RATIO: 3.5 (calc) (ref <5.0)
Cholesterol: 80 mg/dL (ref <200)
HDL: 23 mg/dL — ABNORMAL LOW (ref >40)
LDL CHOLESTEROL (CALC): 39 mg/dL
NON-HDL CHOLESTEROL (CALC): 57 mg/dL (ref <130)
Triglycerides: 96 mg/dL (ref <150)

## 2017-02-24 LAB — TESTOSTERONE: Testosterone: 677 ng/dL (ref 250–827)

## 2017-02-27 ENCOUNTER — Other Ambulatory Visit: Payer: Self-pay | Admitting: Family Medicine

## 2017-02-27 LAB — URINE CULTURE
MICRO NUMBER:: 90239179
SPECIMEN QUALITY:: ADEQUATE

## 2017-02-27 MED ORDER — CEPHALEXIN 500 MG PO CAPS: 500.0000 mg | ORAL_CAPSULE | Freq: Three times a day (TID) | ORAL | 0 refills | Status: DC

## 2017-03-15 ENCOUNTER — Other Ambulatory Visit: Payer: Self-pay | Admitting: Family Medicine

## 2017-03-23 ENCOUNTER — Ambulatory Visit: Payer: Commercial Managed Care - PPO | Admitting: Family Medicine

## 2017-03-23 DIAGNOSIS — Z0289 Encounter for other administrative examinations: Secondary | ICD-10-CM

## 2017-03-23 NOTE — Progress Notes (Deleted)
OFFICE VISIT  03/23/2017   CC: No chief complaint on file.    HPI:    Patient is a 57 y.o. Caucasian male who presents for f/u DM 2, HTN, HLD. Reviewed labs done 1 mo ago in detail today.  HbA1c up to 8.5%, otherwise all normal/at goal.  DM 2: pt compliant with victoza and metformin daily.   He had a staph UTI 1 mo ago--sensitive to cephalexin and treated with this med.  Past Medical History:  Diagnosis Date  . Anxiety   . Arthritis    lower back   . Depression   . Diabetes mellitus   . GERD (gastroesophageal reflux disease)   . Hyperlipidemia   . Hypertension   . Hypogonadism, male 05/2015  . Low back pain 2016   Left lumbar radiculopathy, lumbar spondylolisthesis.  Sciatica episode 03/2016.  Ortho getting L spine MRI as of 07/2016.  . Microcytic anemia 12/2015   suspect iron def due to malabsorption.  Hemoccults NEG 01/26/16, ferrous sulfate started.  . Neuropathic pain of left foot   . OSA on CPAP    cpap settings at 3    Past Surgical History:  Procedure Laterality Date  . ADENOIDECTOMY    . BREATH TEK H PYLORI N/A 01/08/2013   Procedure: BREATH TEK H PYLORI;  Surgeon: [name], MD;  Location: [facility];  Service: General;  Laterality: N/A;  . COLONOSCOPY  04/17/2014   Benign polyp x 1.  Recall 10 yrs.  Marland Kitchen GASTRIC ROUX-EN-Y N/A 04/14/2013   Procedure: LAPAROSCOPIC ROUX-EN-Y GASTRIC BYPASS WITH UPPER ENDOSCOPY;  Surgeon: [name], MD;  Location: [facility];  Service: General;  Laterality: N/A;  . TONSILLECTOMY      Outpatient Medications Prior to Visit  Medication Sig Dispense Refill  . ALPRAZolam (XANAX) 0.5 MG tablet TAKE ONE TABLET BY MOUTH EVERY 8 HOURS AS NEEDED FOR  SLEEP  OR  ANXIETY 30 tablet 5  . amLODipine (NORVASC) 5 MG tablet TAKE 1 TABLET BY MOUTH ONCE DAILY 90 tablet 2  . aspirin EC 81 MG tablet Take 81 mg by mouth daily.    Marland Kitchen atorvastatin (LIPITOR) 40 MG tablet Take 1 tablet (40 mg total) by mouth daily. 90 tablet 3  . benazepril  (LOTENSIN) 40 MG tablet TAKE 1 TABLET BY MOUTH ONCE DAILY IN THE MORNING 90 tablet 1  . carvedilol (COREG) 25 MG tablet TAKE 1 TABLET BY MOUTH TWICE DAILY WITH MEALS 180 tablet 1  . cephALEXin (KEFLEX) 500 MG capsule Take 1 capsule (500 mg total) by mouth 3 (three) times daily. 30 capsule 0  . glucose blood (BAYER CONTOUR NEXT TEST) test strip 1 each by Other route daily. And lancets qd 100 each 12  . Insulin Pen Needle 31G X 5 MM MISC Use with Victoza as directed 100 each 11  . LYRICA 150 MG capsule TAKE 1 CAPSULE BY MOUTH TWICE DAILY 60 capsule 5  . metFORMIN (GLUCOPHAGE-XR) 500 MG 24 hr tablet TAKE 1 TABLET BY MOUTH TWICE DAILY 180 tablet 1  . Omega-3 Fatty Acids (FISH OIL) 500 MG CAPS Take 1 capsule by mouth 2 (two) times daily.    Marland Kitchen testosterone cypionate (DEPOTESTOSTERONE CYPIONATE) 200 MG/ML injection INJECT 1 ML INTO THE MUSCLE EVERY 14 DAYS 10 mL 0  . VICTOZA 18 MG/3ML SOPN INJECT 0.2 MLS (1.2 MGS TOTAL) SUBCUTANEOUSLY ONCE DAILY 18 pen 0  . zaleplon (SONATA) 5 MG capsule TAKE 1 CAPSULE BY MOUTH AT BEDTIME AS NEEDED FOR SLEEP 90 capsule  1   No facility-administered medications prior to visit.     No Known Allergies  ROS As per HPI  PE: There were no vitals taken for this visit. ***  LABS:  Lab Results  Component Value Date   TSH 0.56 09/21/2016   Lab Results  Component Value Date   WBC 4.5 02/23/2017   HGB 14.9 02/23/2017   HCT 42.7 02/23/2017   MCV 84.7 02/23/2017   PLT 204 02/23/2017   Lab Results  Component Value Date   CREATININE 0.86 02/23/2017   BUN 8 02/23/2017   NA 139 02/23/2017   K 4.4 02/23/2017   CL 103 02/23/2017   CO2 27 02/23/2017   Lab Results  Component Value Date   ALT 25 09/21/2016   AST 20 09/21/2016   ALKPHOS 65 09/21/2016   BILITOT 0.5 09/21/2016   Lab Results  Component Value Date   CHOL 80 02/23/2017   Lab Results  Component Value Date   HDL 23 (L) 02/23/2017   Lab Results  Component Value Date   LDLCALC 39 02/23/2017    Lab Results  Component Value Date   TRIG 96 02/23/2017   Lab Results  Component Value Date   CHOLHDL 3.5 02/23/2017   Lab Results  Component Value Date   PSA 1.75 06/23/2016   PSA 1.17 04/28/2015   Lab Results  Component Value Date   HGBA1C 8.5 (H) 02/23/2017   Lab Results  Component Value Date   TESTOSTERONE 677 02/23/2017    IMPRESSION AND PLAN:  No problem-specific Assessment & Plan notes found for this encounter.   FOLLOW UP: No follow-ups on file.

## 2017-04-02 ENCOUNTER — Other Ambulatory Visit: Payer: Self-pay | Admitting: Family Medicine

## 2017-04-02 NOTE — Telephone Encounter (Signed)
[facility] [place]  RF request for alprazolam LOV: 02/23/17 Next ov: None Last written: 08/28/16 #30 w/ 5RF  Please advise. Thanks.

## 2017-04-03 NOTE — Telephone Encounter (Signed)
Will do rx for 1 mo supply with 1 RF. He is due for f/u (30 min appt pls) in 2 mo.-thx

## 2017-04-04 NOTE — Telephone Encounter (Signed)
Rx faxed

## 2017-04-23 ENCOUNTER — Other Ambulatory Visit: Payer: Self-pay | Admitting: Family Medicine

## 2017-04-27 ENCOUNTER — Other Ambulatory Visit: Payer: Self-pay | Admitting: Family Medicine

## 2017-04-27 NOTE — Telephone Encounter (Signed)
[facility]  [place]  RF request for Lyrica LOV: 09/21/16 Next ov: None Last written: 10/27/16 #60 w/ 5RF  Please advise. Thanks.

## 2017-04-27 NOTE — Telephone Encounter (Signed)
Will do 1 mo supply with no RF. Pt needs o/v for f/u chronic illnesses in 1 mo.

## 2017-04-30 NOTE — Telephone Encounter (Signed)
Rx faxed

## 2017-04-30 NOTE — Telephone Encounter (Signed)
Tried calling pt, NA, unable to leave a message due for phone error (lost wifi).   Tried calling again pt cell busy.

## 2017-05-02 NOTE — Telephone Encounter (Signed)
Left detailed message on cell vm, okay per DPR.  

## 2017-05-07 ENCOUNTER — Encounter: Payer: Self-pay | Admitting: *Deleted

## 2017-05-07 NOTE — Telephone Encounter (Signed)
Letter mailed to pt.  

## 2017-05-18 ENCOUNTER — Other Ambulatory Visit: Payer: Self-pay | Admitting: Family Medicine

## 2017-05-21 NOTE — Telephone Encounter (Signed)
Pt is due for f/u RCI, needs office visit for more refills. Will send Rx for #60 w/ 0RF.

## 2017-05-22 NOTE — Telephone Encounter (Signed)
MyChart message read.

## 2017-06-05 ENCOUNTER — Other Ambulatory Visit: Payer: Self-pay | Admitting: Family Medicine

## 2017-06-05 NOTE — Telephone Encounter (Signed)
[facility] [place]  RF request for Lyrica LOV: 09/21/16 Next ov: None Last written: 04/27/17 #60 w/ 1AC  Please advise. Thanks.

## 2017-06-11 ENCOUNTER — Other Ambulatory Visit: Payer: Self-pay | Admitting: Family Medicine

## 2017-06-11 NOTE — Telephone Encounter (Signed)
Pt was advised on 05/21/17 that he needed office visit for more refills. Will send Rx for 30 day supply.

## 2017-06-20 ENCOUNTER — Other Ambulatory Visit: Payer: Self-pay | Admitting: Family Medicine

## 2017-06-20 DIAGNOSIS — I10 Essential (primary) hypertension: Secondary | ICD-10-CM

## 2017-06-20 NOTE — Telephone Encounter (Signed)
I will do 30d supply of his amlodipine and metformin. I won't RF the others. This is the last that I will prescribe until he follows up as recommended (30 min appt)-thx

## 2017-06-20 NOTE — Telephone Encounter (Signed)
Pt has been advised 3 times to schedule a follow up apt. Pt no showed apt scheduled for 03/23/17.  RF request for alprazolam LOV: 09/21/16 Next ov: None Last written: 04/03/17 #30 w/ 1RF  RF request for amlodipine Last written: 09/25/16 #90 w/ 2RF  RF request for atorvastatin Last written: 06/27/16 #90 w/ 3RF  RF request for metformin Last written: 05/21/17 #60 w/ 0RF  Please advise. Thanks.

## 2017-06-21 NOTE — Telephone Encounter (Signed)
Pt advised and voiced understanding.   Apt has been made for 06/25/17 at 11:30am.

## 2017-06-25 ENCOUNTER — Encounter: Payer: Self-pay | Admitting: Family Medicine

## 2017-06-25 ENCOUNTER — Ambulatory Visit (INDEPENDENT_AMBULATORY_CARE_PROVIDER_SITE_OTHER): Payer: Commercial Managed Care - PPO | Admitting: Family Medicine

## 2017-06-25 VITALS — BP 125/75 | HR 83 | Temp 98.3°F | Resp 16 | Ht 72.0 in | Wt 291.0 lb

## 2017-06-25 DIAGNOSIS — I10 Essential (primary) hypertension: Secondary | ICD-10-CM | POA: Diagnosis not present

## 2017-06-25 DIAGNOSIS — E7800 Pure hypercholesterolemia, unspecified: Secondary | ICD-10-CM

## 2017-06-25 DIAGNOSIS — J069 Acute upper respiratory infection, unspecified: Secondary | ICD-10-CM

## 2017-06-25 DIAGNOSIS — E291 Testicular hypofunction: Secondary | ICD-10-CM

## 2017-06-25 DIAGNOSIS — B9789 Other viral agents as the cause of diseases classified elsewhere: Secondary | ICD-10-CM

## 2017-06-25 DIAGNOSIS — E119 Type 2 diabetes mellitus without complications: Secondary | ICD-10-CM | POA: Diagnosis not present

## 2017-06-25 LAB — BASIC METABOLIC PANEL
BUN: 14 mg/dL (ref 6–23)
CO2: 26 meq/L (ref 19–32)
CREATININE: 0.96 mg/dL (ref 0.40–1.50)
Calcium: 9.3 mg/dL (ref 8.4–10.5)
Chloride: 100 mEq/L (ref 96–112)
GFR: 86.25 mL/min (ref >60.00)
GLUCOSE: 228 mg/dL — AB (ref 70–99)
Potassium: 4 mEq/L (ref 3.5–5.1)
SODIUM: 136 meq/L (ref 135–145)

## 2017-06-25 LAB — PSA: PSA: 2.81 ng/mL (ref 0.10–4.00)

## 2017-06-25 LAB — HEMOGLOBIN A1C: Hgb A1c MFr Bld: 8.4 % — ABNORMAL HIGH (ref 4.6–6.5)

## 2017-06-25 NOTE — Progress Notes (Signed)
OFFICE VISIT  06/25/2017   CC:  Chief Complaint  Patient presents with  . Follow-up    RCI, pt is fasting.      HPI:    Patient is a 57 y.o. Caucasian male who presents for f/u DM 2, HTN, HLD, also has recent URI/cough.  DM: fastings 120-180.  No checks later in the day.   HTN: occ bp check upper 130s/upper 80s for the most part.  1 week URI sx's and cough, no fevers.  Feels like he is rattling.  No SOB or wheezing. No face pain, ST, or HA.  Taking coracedin.  ROS: no CP, no melena, no palpitations, no vision c/o, no dizziness, no LE swelling.  Past Medical History:  Diagnosis Date  . Anxiety   . Arthritis    lower back   . Depression   . Diabetes mellitus   . GERD (gastroesophageal reflux disease)   . Hyperlipidemia   . Hypertension   . Hypogonadism, male 05/2015  . Low back pain 2016   Left lumbar radiculopathy, lumbar spondylolisthesis.  Sciatica episode 03/2016.  Ortho getting L spine MRI as of 07/2016.  . Microcytic anemia 12/2015   suspect iron def due to malabsorption.  Hemoccults NEG 01/26/16, ferrous sulfate started.  . Neuropathic pain of left foot   . OSA on CPAP    cpap settings at 3    Past Surgical History:  Procedure Laterality Date  . ADENOIDECTOMY    . BREATH TEK H PYLORI N/A 01/08/2013   Procedure: BREATH TEK H PYLORI;  Surgeon: [name], MD;  Location: [facility];  Service: General;  Laterality: N/A;  . COLONOSCOPY  04/17/2014   Benign polyp x 1.  Recall 10 yrs.  Marland Kitchen GASTRIC ROUX-EN-Y N/A 04/14/2013   Procedure: LAPAROSCOPIC ROUX-EN-Y GASTRIC BYPASS WITH UPPER ENDOSCOPY;  Surgeon: [name], MD;  Location: [facility];  Service: General;  Laterality: N/A;  . TONSILLECTOMY      Outpatient Medications Prior to Visit  Medication Sig Dispense Refill  . ALPRAZolam (XANAX) 0.5 MG tablet TAKE 1 TABLET BY MOUTH EVERY 8 HOURS AS NEEDED FOR SLEEP OR ANXIETY 30 tablet 1  . amLODipine (NORVASC) 5 MG tablet TAKE 1 TABLET BY MOUTH ONCE DAILY 30  tablet 0  . aspirin EC 81 MG tablet Take 81 mg by mouth daily.    Marland Kitchen atorvastatin (LIPITOR) 40 MG tablet Take 1 tablet (40 mg total) by mouth daily. 90 tablet 3  . benazepril (LOTENSIN) 40 MG tablet TAKE 1 TABLET BY MOUTH ONCE DAILY IN THE MORNING 90 tablet 0  . carvedilol (COREG) 25 MG tablet TAKE 1 TABLET BY MOUTH TWICE DAILY WITH MEALS 180 tablet 0  . glucose blood (BAYER CONTOUR NEXT TEST) test strip 1 each by Other route daily. And lancets qd 100 each 12  . Insulin Pen Needle 31G X 5 MM MISC Use with Victoza as directed 100 each 11  . LYRICA 150 MG capsule TAKE 1 CAPSULE BY MOUTH TWICE DAILY 60 capsule 5  . metFORMIN (GLUCOPHAGE-XR) 500 MG 24 hr tablet 1 tab po bid.  This is the final rx for this med until pt is seen for office follow up as instructed. 60 tablet 0  . Omega-3 Fatty Acids (FISH OIL) 500 MG CAPS Take 1 capsule by mouth 2 (two) times daily.    Marland Kitchen testosterone cypionate (DEPOTESTOSTERONE CYPIONATE) 200 MG/ML injection INJECT 1 ML INTO THE MUSCLE EVERY 14 DAYS 10 mL 0  . VICTOZA 18 MG/3ML  SOPN INJECT 0.2 ML (1.2MG ) SUBCUTANEOUSLY ONCE DAILY 2 pen 0  . zaleplon (SONATA) 5 MG capsule TAKE 1 CAPSULE BY MOUTH AT BEDTIME AS NEEDED FOR SLEEP 90 capsule 1  . cephALEXin (KEFLEX) 500 MG capsule Take 1 capsule (500 mg total) by mouth 3 (three) times daily. (Patient not taking: Reported on 06/25/2017) 30 capsule 0   No facility-administered medications prior to visit.     No Known Allergies  ROS As per HPI  PE: Blood pressure 125/75, pulse 83, temperature 98.3 F (36.8 C), temperature source Oral, resp. rate 16, height 6' (1.829 m), weight 291 lb (132 kg), SpO2 93 %. Body mass index is 39.47 kg/m.  Gen: Alert, well appearing.  Patient is oriented to person, place, time, and situation. AFFECT: pleasant, lucid thought and speech. HEENT: eyes without injection, drainage, or swelling.  Ears: EACs clear, TMs with normal light reflex and landmarks.  Nose: Clear rhinorrhea, with some dried,  crusty exudate adherent to mildly injected mucosa.  No purulent d/c.  No paranasal sinus TTP.  No facial swelling.  Throat and mouth without focal lesion.  No pharyngial swelling, erythema, or exudate.   Neck: supple, no LAD.   LUNGS: CTA bilat, nonlabored resps.   CV: RRR, no m/r/g. EXT: no c/c/e SKIN: no rash Foot exam - bilateral normal; no swelling, tenderness or skin or vascular lesions. Color and temperature is normal. Sensation is intact except for very mild diminished sensation to monofilament testing in L foot (hx of spinal radiculopathy on L side).. Peripheral pulses are palpable. Toenails are normal.     LABS:  Lab Results  Component Value Date   TSH 0.56 09/21/2016   Lab Results  Component Value Date   WBC 4.5 02/23/2017   HGB 14.9 02/23/2017   HCT 42.7 02/23/2017   MCV 84.7 02/23/2017   PLT 204 02/23/2017   Lab Results  Component Value Date   CREATININE 0.86 02/23/2017   BUN 8 02/23/2017   NA 139 02/23/2017   K 4.4 02/23/2017   CL 103 02/23/2017   CO2 27 02/23/2017   Lab Results  Component Value Date   ALT 25 09/21/2016   AST 20 09/21/2016   ALKPHOS 65 09/21/2016   BILITOT 0.5 09/21/2016   Lab Results  Component Value Date   CHOL 80 02/23/2017   Lab Results  Component Value Date   HDL 23 (L) 02/23/2017   Lab Results  Component Value Date   LDLCALC 39 02/23/2017   Lab Results  Component Value Date   TRIG 96 02/23/2017   Lab Results  Component Value Date   CHOLHDL 3.5 02/23/2017   Lab Results  Component Value Date   PSA 1.75 06/23/2016   PSA 1.17 04/28/2015   Lab Results  Component Value Date   HGBA1C 8.5 (H) 02/23/2017   Lab Results  Component Value Date   TESTOSTERONE 677 02/23/2017    IMPRESSION AND PLAN:  1) DM 2, poor control as of last a1c 4 mo ago--home gluc's lately not very good. Needs to check gluc later in the day some. HbA1c today.  If A1c up, we do have room to increase metformin xr to 1000 mg bid. Feet exam today  normal except very mild diminished sensation on plantar surface of L foot (suspected to be chronic spinal radiculopathy symptom). He declines pneumovax. BMET today.  2) HTN: control fair.  Pt is not in favor of bp med increase/change/addition at this time. BMET.  3) HLD: tolerating statin.  Last lipid panel 02/2017 very good.  Repeat 3 mo.  4) Male hypogonadism: due for CBC and PSA monitoring.  5) Viral URI with cough/acute viral bronchitis.  NO sign of RAD. Continue coracedin. Signs/symptoms to call or return for were reviewed and pt expressed understanding.  An After Visit Summary was printed and given to the patient.  FOLLOW UP: Return in about 3 months (around 09/25/2017) for annual CPE (fasting).  Signed:  Crissie Sickles, MD           06/25/2017

## 2017-06-26 ENCOUNTER — Encounter: Payer: Self-pay | Admitting: Family Medicine

## 2017-06-26 ENCOUNTER — Other Ambulatory Visit: Payer: Self-pay | Admitting: *Deleted

## 2017-06-26 ENCOUNTER — Telehealth: Payer: Self-pay | Admitting: Family Medicine

## 2017-06-26 MED ORDER — GLIPIZIDE ER 5 MG PO TB24: 5.0000 mg | ORAL_TABLET | Freq: Every day | ORAL | 3 refills | Status: DC

## 2017-06-26 MED ORDER — METFORMIN HCL ER (MOD) 1000 MG PO TB24: 1000.0000 mg | ORAL_TABLET | Freq: Two times a day (BID) | ORAL | 3 refills | Status: DC

## 2017-06-26 NOTE — Telephone Encounter (Signed)
Copied from [place] [id number]. Topic: Quick Communication - See Telephone Encounter >> Jun 26, 2017  1:03 PM Burchel, Abbi R wrote:  See Telephone encounter for: 06/26/17.  Tiffany ([place]) called to confirm that Dr. Anitra Lauth meant to send over Rx for:  metFORMIN (GLUMETZA) 1000 MG (MOD) 24 hr tablet.  She sates that it is not covered by pt's insurance and is very expensive. Please advise.    [place] [address] [place] [address] [phone] (Phone) [phone] (Fax)

## 2017-06-26 NOTE — Telephone Encounter (Signed)
OK, pls cancel that metformin rx. Pls do rx for metformin 1000mg , 1 tab po bid, #60, RF x 3.-thx

## 2017-06-27 MED ORDER — METFORMIN HCL 1000 MG PO TABS: 1000.0000 mg | ORAL_TABLET | Freq: Two times a day (BID) | ORAL | 3 refills | Status: DC

## 2017-06-27 NOTE — Telephone Encounter (Signed)
Pharmacy notified and Rx canceled, new prescription escribed per providers order.

## 2017-07-12 ENCOUNTER — Other Ambulatory Visit: Payer: Self-pay | Admitting: Family Medicine

## 2017-07-20 ENCOUNTER — Other Ambulatory Visit: Payer: Self-pay | Admitting: Family Medicine

## 2017-07-20 DIAGNOSIS — I10 Essential (primary) hypertension: Secondary | ICD-10-CM

## 2017-07-23 ENCOUNTER — Encounter: Payer: Self-pay | Admitting: Family Medicine

## 2017-07-23 LAB — HM DIABETES EYE EXAM

## 2017-08-07 ENCOUNTER — Other Ambulatory Visit: Payer: Self-pay | Admitting: Family Medicine

## 2017-09-07 ENCOUNTER — Other Ambulatory Visit: Payer: Self-pay | Admitting: Family Medicine

## 2017-09-07 NOTE — Telephone Encounter (Signed)
LOV: 06/25/17 NOV: 10/09/17  RF request for sonata Last written: 12/07/16 #90 w/ 1RF  RF request for testosterone Last written: 01/23/17 #73ml w/ 0RF  RF request for victoza Last written: 07/12/17 #6 w/ 1RF  Please advise. Thanks.

## 2017-10-09 ENCOUNTER — Encounter: Payer: Self-pay | Admitting: Family Medicine

## 2017-10-09 ENCOUNTER — Ambulatory Visit (INDEPENDENT_AMBULATORY_CARE_PROVIDER_SITE_OTHER): Payer: Commercial Managed Care - PPO | Admitting: Family Medicine

## 2017-10-09 VITALS — BP 150/83 | HR 79 | Temp 98.1°F | Resp 16 | Ht 72.0 in | Wt 296.2 lb

## 2017-10-09 DIAGNOSIS — Z0000 Encounter for general adult medical examination without abnormal findings: Secondary | ICD-10-CM | POA: Diagnosis not present

## 2017-10-09 DIAGNOSIS — E118 Type 2 diabetes mellitus with unspecified complications: Secondary | ICD-10-CM | POA: Diagnosis not present

## 2017-10-09 DIAGNOSIS — R972 Elevated prostate specific antigen [PSA]: Secondary | ICD-10-CM

## 2017-10-09 DIAGNOSIS — E7800 Pure hypercholesterolemia, unspecified: Secondary | ICD-10-CM | POA: Diagnosis not present

## 2017-10-09 DIAGNOSIS — I10 Essential (primary) hypertension: Secondary | ICD-10-CM | POA: Diagnosis not present

## 2017-10-09 LAB — CBC WITH DIFFERENTIAL/PLATELET
BASOS PCT: 1.2 % (ref 0.0–3.0)
Basophils Absolute: 0.1 10*3/uL (ref 0.0–0.1)
EOS PCT: 4.6 % (ref 0.0–5.0)
Eosinophils Absolute: 0.2 10*3/uL (ref 0.0–0.7)
HCT: 42.8 % (ref 39.0–52.0)
HEMOGLOBIN: 14.6 g/dL (ref 13.0–17.0)
LYMPHS ABS: 1.8 10*3/uL (ref 0.7–4.0)
Lymphocytes Relative: 36.5 % (ref 12.0–46.0)
MCHC: 34.1 g/dL (ref 30.0–36.0)
MCV: 88.4 fl (ref 78.0–100.0)
MONO ABS: 0.5 10*3/uL (ref 0.1–1.0)
Monocytes Relative: 10.2 % (ref 3.0–12.0)
NEUTROS PCT: 47.5 % (ref 43.0–77.0)
Neutro Abs: 2.4 10*3/uL (ref 1.4–7.7)
Platelets: 180 10*3/uL (ref 150.0–400.0)
RBC: 4.84 Mil/uL (ref 4.22–5.81)
RDW: 15 % (ref 11.5–15.5)
WBC: 5 10*3/uL (ref 4.0–10.5)

## 2017-10-09 LAB — LIPID PANEL
Cholesterol: 142 mg/dL (ref 0–200)
HDL: 35.8 mg/dL — AB (ref >39.00)
LDL Cholesterol: 71 mg/dL (ref 0–99)
NonHDL: 106.01
Total CHOL/HDL Ratio: 4
Triglycerides: 177 mg/dL — ABNORMAL HIGH (ref 0.0–149.0)
VLDL: 35.4 mg/dL (ref 0.0–40.0)

## 2017-10-09 LAB — COMPREHENSIVE METABOLIC PANEL
ALT: 24 U/L (ref 0–53)
AST: 20 U/L (ref 0–37)
Albumin: 4.2 g/dL (ref 3.5–5.2)
Alkaline Phosphatase: 52 U/L (ref 39–117)
BILIRUBIN TOTAL: 0.5 mg/dL (ref 0.2–1.2)
BUN: 13 mg/dL (ref 6–23)
CHLORIDE: 103 meq/L (ref 96–112)
CO2: 28 meq/L (ref 19–32)
Calcium: 9.5 mg/dL (ref 8.4–10.5)
Creatinine, Ser: 0.95 mg/dL (ref 0.40–1.50)
GFR: 87.21 mL/min (ref >60.00)
GLUCOSE: 102 mg/dL — AB (ref 70–99)
Potassium: 4.1 mEq/L (ref 3.5–5.1)
Sodium: 139 mEq/L (ref 135–145)
Total Protein: 7.1 g/dL (ref 6.0–8.3)

## 2017-10-09 LAB — TSH: TSH: 0.8 u[IU]/mL (ref 0.35–4.50)

## 2017-10-09 LAB — HEMOGLOBIN A1C: HEMOGLOBIN A1C: 6.3 % (ref 4.6–6.5)

## 2017-10-09 LAB — PSA: PSA: 1.82 ng/mL (ref 0.10–4.00)

## 2017-10-09 MED ORDER — BENZONATATE 200 MG PO CAPS: 200.0000 mg | ORAL_CAPSULE | Freq: Two times a day (BID) | ORAL | 0 refills | Status: DC | PRN

## 2017-10-09 MED ORDER — ALPRAZOLAM 0.5 MG PO TABS: ORAL_TABLET | ORAL | 5 refills | Status: DC

## 2017-10-09 NOTE — Progress Notes (Signed)
Office Note 10/09/2017  CC:  Chief Complaint  Patient presents with  . Annual Exam    Pt is fasting.     HPI:  Ethan Pitts is a 57 y.o. White male who is here for annual health maintenance exam.  Glucoses July and august avg 140s or so fasting and PP. No hypoglycemia.  Highest 220--after having had alcohol the night before. Compliant with DM meds, but not eating diab diet so much and doing no exercise.  Past Medical History:  Diagnosis Date  . Anxiety   . Arthritis    lower back   . Depression   . Diabetes mellitus   . GERD (gastroesophageal reflux disease)   . Hyperlipidemia   . Hypertension   . Hypogonadism, male 05/2015  . Low back pain 2016   Left lumbar radiculopathy, lumbar spondylolisthesis.  Sciatica episode 03/2016.  Ortho getting L spine MRI as of 07/2016.  . Microcytic anemia 12/2015   suspect iron def due to malabsorption.  Hemoccults NEG 01/26/16, ferrous sulfate started.  . Neuropathic pain of left foot   . OSA on CPAP    cpap settings at 3    Past Surgical History:  Procedure Laterality Date  . ADENOIDECTOMY    . BREATH TEK H PYLORI N/A 01/08/2013   Procedure: BREATH TEK H PYLORI;  Surgeon: [name], MD;  Location: [facility];  Service: General;  Laterality: N/A;  . COLONOSCOPY  04/17/2014   Benign polyp x 1.  Recall 10 yrs.  Marland Kitchen GASTRIC ROUX-EN-Y N/A 04/14/2013   Procedure: LAPAROSCOPIC ROUX-EN-Y GASTRIC BYPASS WITH UPPER ENDOSCOPY;  Surgeon: [name], MD;  Location: [facility];  Service: General;  Laterality: N/A;  . TONSILLECTOMY      Family History  Problem Relation Age of Onset  . Heart disease Father        valvular disease  . COPD Father   . Prostate cancer Father   . Arrhythmia Father   . Colon cancer Neg Hx     Social History   Socioeconomic History  . Marital status: Single    Spouse name: Not on file  . Number of children: Not on file  . Years of education: Not on file  . Highest education level: Not on  file  Occupational History  . 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 Financial resource strain: Not on file  . Food insecurity:    Worry: Not on file    Inability: Not on file  . Transportation needs:    Medical: Not on file    Non-medical: Not on file  Tobacco Use  . Smoking status: Never Smoker  . Smokeless tobacco: Former Systems developer    Types: Chew  Substance and Sexual Activity  . Alcohol use: Yes    Alcohol/week: 12.0 standard drinks    Types: 12 Cans of beer per week    Comment:  12pk/per week  . Drug use: No  . Sexual activity: Not on file  Lifestyle  . Physical activity:    Days per week: Not on file    Minutes per session: Not on file  . Stress: Not on file  Relationships  . Social connections:    Talks on phone: Not on file    Gets together: Not on file    Attends religious service: Not on file    Active member of club or organization: Not on file    Attends meetings of clubs or  organizations: Not on file    Relationship status: Not on file  . Intimate partner violence:    Fear of current or ex partner: Not on file    Emotionally abused: Not on file    Physically abused: Not on file    Forced sexual activity: Not on file  Other Topics Concern  . Not on file  Social History Narrative   Married, no children.   Occupation: paramedic    Outpatient Medications Prior to Visit  Medication Sig Dispense Refill  . amLODipine (NORVASC) 5 MG tablet TAKE 1 TABLET BY MOUTH ONCE DAILY 90 tablet 0  . aspirin EC 81 MG tablet Take 81 mg by mouth daily.    . benazepril (LOTENSIN) 40 MG tablet TAKE 1 TABLET BY MOUTH IN THE MORNING 90 tablet 0  . carvedilol (COREG) 25 MG tablet TAKE 1 TABLET BY MOUTH TWICE DAILY WITH MEALS 180 tablet 0  . glipiZIDE (GLUCOTROL XL) 5 MG 24 hr tablet Take 1 tablet (5 mg total) by mouth daily with breakfast. 30 tablet 3  . glucose blood (BAYER CONTOUR NEXT TEST) test strip 1 each by Other route daily. And  lancets qd 100 each 12  . Insulin Pen Needle 31G X 5 MM MISC Use with Victoza as directed 100 each 11  . LYRICA 150 MG capsule TAKE 1 CAPSULE BY MOUTH TWICE DAILY 60 capsule 5  . metFORMIN (GLUCOPHAGE) 1000 MG tablet Take 1 tablet (1,000 mg total) by mouth 2 (two) times daily with a meal. 60 tablet 3  . Omega-3 Fatty Acids (FISH OIL) 500 MG CAPS Take 1 capsule by mouth 2 (two) times daily.    Marland Kitchen testosterone cypionate (DEPOTESTOSTERONE CYPIONATE) 200 MG/ML injection INJECT 1 ML INTO THE MUSCLE EVERY 14 DAYS 10 mL 0  . VICTOZA 18 MG/3ML SOPN INJECT 1.2 MG TOTAL INTO THE SKIN DAILY 6 pen 1  . zaleplon (SONATA) 5 MG capsule TAKE 1 CAPSULE BY MOUTH AT BEDTIME AS NEEDED FOR SLEEP 90 capsule 1  . atorvastatin (LIPITOR) 40 MG tablet Take 1 tablet (40 mg total) by mouth daily. (Patient not taking: Reported on 10/09/2017) 90 tablet 3  . ALPRAZolam (XANAX) 0.5 MG tablet TAKE 1 TABLET BY MOUTH EVERY 8 HOURS AS NEEDED FOR SLEEP OR ANXIETY (Patient not taking: Reported on 10/09/2017) 30 tablet 1   No facility-administered medications prior to visit.     No Known Allergies  ROS Review of Systems  Constitutional: Negative for appetite change, chills, fatigue and fever.  HENT: Negative for congestion, dental problem, ear pain and sore throat.   Eyes: Negative for discharge, redness and visual disturbance.  Respiratory: Negative for cough, chest tightness, shortness of breath and wheezing.   Cardiovascular: Negative for chest pain, palpitations and leg swelling.  Gastrointestinal: Negative for abdominal pain, blood in stool, diarrhea, nausea and vomiting.  Genitourinary: Negative for difficulty urinating, dysuria, flank pain, frequency, hematuria and urgency.  Musculoskeletal: Negative for arthralgias, back pain, joint swelling, myalgias and neck stiffness.  Skin: Negative for pallor and rash.  Neurological: Negative for dizziness, speech difficulty, weakness and headaches.  Hematological: Negative for  adenopathy. Does not bruise/bleed easily.  Psychiatric/Behavioral: Negative for confusion and sleep disturbance. The patient is not nervous/anxious.     PE; Blood pressure (!) 150/83, pulse 79, temperature 98.1 F (36.7 C), temperature source Oral, resp. rate 16, height 6' (1.829 m), weight 296 lb 4 oz (134.4 kg), SpO2 94 %. Gen: Alert, well appearing.  Patient is oriented to person, place,  time, and situation. AFFECT: pleasant, lucid thought and speech. ENT: Ears: EACs clear, normal epithelium.  TMs with good light reflex and landmarks bilaterally.  Eyes: no injection, icteris, swelling, or exudate.  EOMI, PERRLA. Nose: no drainage or turbinate edema/swelling.  No injection or focal lesion.  Mouth: lips without lesion/swelling.  Oral mucosa pink and moist.  Dentition intact and without obvious caries or gingival swelling.  Oropharynx without erythema, exudate, or swelling.  Neck: supple/nontender.  No LAD, mass, or TM.  Carotid pulses 2+ bilaterally, without bruits. CV: RRR, no m/r/g.   LUNGS: CTA bilat, nonlabored resps, good aeration in all lung fields. ABD: soft, NT, ND, BS normal.  No hepatospenomegaly or mass.  No bruits. EXT: no clubbing, cyanosis, or edema.  Musculoskeletal: no joint swelling, erythema, warmth, or tenderness.  ROM of all joints intact. Skin - no sores or suspicious lesions or rashes or color changes Rectal exam: negative without mass, lesions or tenderness, PROSTATE EXAM: smooth and symmetric without nodules or tenderness.   Pertinent labs:   Lab Results  Component Value Date   TESTOSTERONE 677 02/23/2017    Lab Results  Component Value Date   TSH 0.56 09/21/2016   Lab Results  Component Value Date   WBC 4.5 02/23/2017   HGB 14.9 02/23/2017   HCT 42.7 02/23/2017   MCV 84.7 02/23/2017   PLT 204 02/23/2017   Lab Results  Component Value Date   CREATININE 0.96 06/25/2017   BUN 14 06/25/2017   NA 136 06/25/2017   K 4.0 06/25/2017   CL 100 06/25/2017    CO2 26 06/25/2017   Lab Results  Component Value Date   ALT 25 09/21/2016   AST 20 09/21/2016   ALKPHOS 65 09/21/2016   BILITOT 0.5 09/21/2016   Lab Results  Component Value Date   CHOL 80 02/23/2017   Lab Results  Component Value Date   HDL 23 (L) 02/23/2017   Lab Results  Component Value Date   LDLCALC 39 02/23/2017   Lab Results  Component Value Date   TRIG 96 02/23/2017   Lab Results  Component Value Date   CHOLHDL 3.5 02/23/2017   Lab Results  Component Value Date   PSA 2.81 06/25/2017   PSA 1.75 06/23/2016   PSA 1.17 04/28/2015   Lab Results  Component Value Date   HGBA1C 8.4 (H) 06/25/2017    ASSESSMENT AND PLAN:   Health maintenance exam: Reviewed age and gender appropriate health maintenance issues (prudent diet, regular exercise, health risks of tobacco and excessive alcohol, use of seatbelts, fire alarms in home, use of sunscreen).  Also reviewed age and gender appropriate health screening as well as vaccine recommendations. Vaccines:  Pneumovax 23-->he declined this.   Flu--> gets this at work.   Shingrix-->pt declines. Labs: fasting HP + PSA and HbA1c. Prostate ca screening: last PSA jumped up from 1.75 to 2.81 from June 2018 to June 2019-->DRE normal today, PSA recheck today.  If not back down then will refer to urology. Colon ca screening: next colonoscopy due 2026.  DM 2: recent glucose control fair, but he does not have any glucose numbers from the last 5 wks to show me. HbA1c today.  He is otherwise UTD on all usual diabetic monitoring.  An After Visit Summary was printed and given to the patient.  FOLLOW UP:  Return in about 4 months (around 02/09/2018) for routine chronic illness f/u (30 min).  Signed:  Crissie Sickles, MD  10/09/2017  

## 2017-10-09 NOTE — Patient Instructions (Signed)

## 2017-10-16 ENCOUNTER — Other Ambulatory Visit: Payer: Self-pay | Admitting: Family Medicine

## 2017-10-16 DIAGNOSIS — I10 Essential (primary) hypertension: Secondary | ICD-10-CM

## 2017-11-10 ENCOUNTER — Other Ambulatory Visit: Payer: Self-pay | Admitting: Family Medicine

## 2017-11-11 ENCOUNTER — Other Ambulatory Visit: Payer: Self-pay | Admitting: Family Medicine

## 2017-12-16 ENCOUNTER — Other Ambulatory Visit: Payer: Self-pay | Admitting: Family Medicine

## 2017-12-17 NOTE — Telephone Encounter (Signed)
Pt shouldn't be due for RF until around January 9th. Will decline refill request.

## 2017-12-20 ENCOUNTER — Other Ambulatory Visit: Payer: Self-pay | Admitting: Family Medicine

## 2017-12-20 NOTE — Telephone Encounter (Signed)
RF request for lyrica LOV: 10/09/17 Next ov: 02/22/18 Last written: 06/05/17 #60 w/ 5RF  Please advise. Thanks.

## 2018-01-02 DIAGNOSIS — I4892 Unspecified atrial flutter: Secondary | ICD-10-CM

## 2018-01-02 HISTORY — DX: Unspecified atrial flutter: I48.92

## 2018-01-14 ENCOUNTER — Other Ambulatory Visit: Payer: Self-pay | Admitting: Family Medicine

## 2018-01-15 ENCOUNTER — Other Ambulatory Visit: Payer: Self-pay | Admitting: Family Medicine

## 2018-01-15 MED ORDER — INSULIN PEN NEEDLE 31G X 5 MM MISC: 11 refills | Status: AC

## 2018-01-15 NOTE — Telephone Encounter (Signed)
Rx for Pen Needles has been sent.   RF request for testosterone LOV: 10/09/17 Next ov: 02/22/18 Last written: 09/07/17 #6ml w/ 0RF  Please advise. Thanks.

## 2018-01-15 NOTE — Telephone Encounter (Signed)
Copied from [place] [phone]. Topic: Quick Communication - Rx Refill/Question >> Jan 15, 2018  1:08 PM Scherrie Gerlach wrote: Medication: Insulin Pen Needle 31G X 5 MM MISC  Dr Linna Darner had prescribed for the pt prior to coming to Dr Anitra Lauth, and pt has just now run out. Pt also needs  testosterone cypionate (DEPOTESTOSTERONE CYPIONATE) 200 MG/ML injection  [facility] [address], [place] [address] [phone] (Phone) [phone] (Fax)

## 2018-01-16 MED ORDER — TESTOSTERONE CYPIONATE 200 MG/ML IM SOLN: INTRAMUSCULAR | 0 refills | Status: DC

## 2018-01-17 ENCOUNTER — Other Ambulatory Visit: Payer: Self-pay | Admitting: Family Medicine

## 2018-01-17 DIAGNOSIS — I10 Essential (primary) hypertension: Secondary | ICD-10-CM

## 2018-01-18 ENCOUNTER — Telehealth: Payer: Self-pay | Admitting: *Deleted

## 2018-01-18 NOTE — Telephone Encounter (Signed)
PA sent via covermymed on 01/18/18.   Key: [id number]   Medication: testosterone cypionate 200mg /mL solution   Dx: Hypogonadism, male - E29.1   Per Dr. Anitra Lauth pt has tried and failed: N/A    Waiting for response.

## 2018-01-18 NOTE — Telephone Encounter (Signed)
PA approved 01/18/18   Case #: [id number]   Approved through 01/19/19.

## 2018-02-02 ENCOUNTER — Encounter: Payer: Self-pay | Admitting: Family Medicine

## 2018-02-03 ENCOUNTER — Emergency Department (HOSPITAL_BASED_OUTPATIENT_CLINIC_OR_DEPARTMENT_OTHER): Payer: Commercial Managed Care - PPO

## 2018-02-03 ENCOUNTER — Emergency Department (HOSPITAL_BASED_OUTPATIENT_CLINIC_OR_DEPARTMENT_OTHER)
Admission: EM | Admit: 2018-02-03 | Discharge: 2018-02-03 | Disposition: A | Discharge status: Home / Self Care | Payer: Commercial Managed Care - PPO | Attending: Emergency Medicine | Admitting: Emergency Medicine

## 2018-02-03 ENCOUNTER — Other Ambulatory Visit: Payer: Self-pay

## 2018-02-03 ENCOUNTER — Encounter (HOSPITAL_BASED_OUTPATIENT_CLINIC_OR_DEPARTMENT_OTHER): Payer: Self-pay | Admitting: Emergency Medicine

## 2018-02-03 DIAGNOSIS — Z7984 Long term (current) use of oral hypoglycemic drugs: Secondary | ICD-10-CM | POA: Diagnosis not present

## 2018-02-03 DIAGNOSIS — I4892 Unspecified atrial flutter: Secondary | ICD-10-CM

## 2018-02-03 DIAGNOSIS — E119 Type 2 diabetes mellitus without complications: Secondary | ICD-10-CM | POA: Insufficient documentation

## 2018-02-03 DIAGNOSIS — R079 Chest pain, unspecified: Secondary | ICD-10-CM | POA: Diagnosis present

## 2018-02-03 DIAGNOSIS — Z87891 Personal history of nicotine dependence: Secondary | ICD-10-CM | POA: Diagnosis not present

## 2018-02-03 DIAGNOSIS — I10 Essential (primary) hypertension: Secondary | ICD-10-CM | POA: Insufficient documentation

## 2018-02-03 DIAGNOSIS — Z79899 Other long term (current) drug therapy: Secondary | ICD-10-CM | POA: Insufficient documentation

## 2018-02-03 DIAGNOSIS — I4891 Unspecified atrial fibrillation: Secondary | ICD-10-CM | POA: Insufficient documentation

## 2018-02-03 DIAGNOSIS — R0602 Shortness of breath: Secondary | ICD-10-CM | POA: Diagnosis not present

## 2018-02-03 LAB — PROTIME-INR
INR: 1.02
Prothrombin Time: 13.3 seconds (ref 11.4–15.2)

## 2018-02-03 LAB — CBC WITH DIFFERENTIAL/PLATELET
Abs Immature Granulocytes: 0.01 10*3/uL (ref 0.00–0.07)
Basophils Absolute: 0 10*3/uL (ref 0.0–0.1)
Basophils Relative: 1 %
Eosinophils Absolute: 0.1 10*3/uL (ref 0.0–0.5)
Eosinophils Relative: 4 %
HCT: 40.4 % (ref 39.0–52.0)
Hemoglobin: 13.3 g/dL (ref 13.0–17.0)
Immature Granulocytes: 0 %
Lymphocytes Relative: 26 %
Lymphs Abs: 0.7 10*3/uL (ref 0.7–4.0)
MCH: 29.9 pg (ref 26.0–34.0)
MCHC: 32.9 g/dL (ref 30.0–36.0)
MCV: 90.8 fL (ref 80.0–100.0)
MONO ABS: 0.5 10*3/uL (ref 0.1–1.0)
Monocytes Relative: 17 %
NEUTROS ABS: 1.5 10*3/uL — AB (ref 1.7–7.7)
Neutrophils Relative %: 52 %
Platelets: 122 10*3/uL — ABNORMAL LOW (ref 150–400)
RBC: 4.45 MIL/uL (ref 4.22–5.81)
RDW: 13.1 % (ref 11.5–15.5)
WBC: 2.8 10*3/uL — ABNORMAL LOW (ref 4.0–10.5)
nRBC: 0 % (ref 0.0–0.2)

## 2018-02-03 LAB — COMPREHENSIVE METABOLIC PANEL
ALT: 27 U/L (ref 0–44)
AST: 26 U/L (ref 15–41)
Albumin: 3.6 g/dL (ref 3.5–5.0)
Alkaline Phosphatase: 51 U/L (ref 38–126)
Anion gap: 7 (ref 5–15)
BUN: 13 mg/dL (ref 6–20)
CO2: 24 mmol/L (ref 22–32)
Calcium: 8.6 mg/dL — ABNORMAL LOW (ref 8.9–10.3)
Chloride: 100 mmol/L (ref 98–111)
Creatinine, Ser: 1.03 mg/dL (ref 0.61–1.24)
GFR calc Af Amer: >60 mL/min (ref >60)
GFR calc non Af Amer: >60 mL/min (ref >60)
Glucose, Bld: 222 mg/dL — ABNORMAL HIGH (ref 70–99)
Potassium: 3.6 mmol/L (ref 3.5–5.1)
Sodium: 131 mmol/L — ABNORMAL LOW (ref 135–145)
Total Bilirubin: 0.6 mg/dL (ref 0.3–1.2)
Total Protein: 6.7 g/dL (ref 6.5–8.1)

## 2018-02-03 LAB — TSH: TSH: 0.581 u[IU]/mL (ref 0.350–4.500)

## 2018-02-03 LAB — URINALYSIS, ROUTINE W REFLEX MICROSCOPIC
Bilirubin Urine: NEGATIVE
HGB URINE DIPSTICK: NEGATIVE
Ketones, ur: NEGATIVE mg/dL
LEUKOCYTES UA: NEGATIVE
Nitrite: NEGATIVE
Protein, ur: NEGATIVE mg/dL
Specific Gravity, Urine: 1.025 (ref 1.005–1.030)
pH: 5.5 (ref 5.0–8.0)

## 2018-02-03 LAB — URINALYSIS, MICROSCOPIC (REFLEX)

## 2018-02-03 LAB — PHOSPHORUS: Phosphorus: 1.9 mg/dL — ABNORMAL LOW (ref 2.5–4.6)

## 2018-02-03 LAB — MAGNESIUM: Magnesium: 1.6 mg/dL — ABNORMAL LOW (ref 1.7–2.4)

## 2018-02-03 LAB — TROPONIN I: Troponin I: <0.03 ng/mL (ref <0.03)

## 2018-02-03 LAB — BRAIN NATRIURETIC PEPTIDE: B Natriuretic Peptide: 334.5 pg/mL — ABNORMAL HIGH (ref 0.0–100.0)

## 2018-02-03 MED ORDER — METOPROLOL TARTRATE 5 MG/5ML IV SOLN
5.0000 mg | Freq: Once | INTRAVENOUS | Status: AC
  Administered 2018-02-03: 5 mg via INTRAVENOUS
  Filled 2018-02-03: qty 5

## 2018-02-03 MED ORDER — METOPROLOL TARTRATE 50 MG PO TABS: 50.0000 mg | ORAL_TABLET | Freq: Two times a day (BID) | ORAL | 0 refills | Status: DC

## 2018-02-03 MED ORDER — RIVAROXABAN 20 MG PO TABS: 20.0000 mg | ORAL_TABLET | Freq: Every day | ORAL | 0 refills | Status: DC

## 2018-02-03 MED ORDER — ASPIRIN 81 MG PO CHEW
324.0000 mg | CHEWABLE_TABLET | Freq: Once | ORAL | Status: AC
  Administered 2018-02-03: 324 mg via ORAL
  Filled 2018-02-03: qty 4

## 2018-02-03 MED ORDER — METOPROLOL TARTRATE 50 MG PO TABS
25.0000 mg | ORAL_TABLET | Freq: Once | ORAL | Status: AC
  Administered 2018-02-03: 25 mg via ORAL
  Filled 2018-02-03: qty 1

## 2018-02-03 MED ORDER — RIVAROXABAN 20 MG PO TABS
20.0000 mg | ORAL_TABLET | Freq: Once | ORAL | Status: AC
  Administered 2018-02-03: 20 mg via ORAL
  Filled 2018-02-03: qty 1

## 2018-02-03 MED ORDER — FUROSEMIDE 40 MG PO TABS: 40.0000 mg | ORAL_TABLET | Freq: Every day | ORAL | 0 refills | Status: DC

## 2018-02-03 NOTE — Discharge Instructions (Addendum)
1.  You have been given your first dose of Xarelto in the emergency department.  Start your prescription tomorrow evening with dinner. 2.  Discontinue your Coreg.  You will now take metoprolol twice a day as prescribed. 3.  Take Lasix once daily. 4.  Discontinue your aspirin while you are taking the Xarelto. 5.  You should be called in the morning to schedule a follow-up appointment with the atrial fibrillation clinic earlier this week. 6.  Return to the emergency department if you develop shortness of breath, chest pain, lightheadedness or other concerning symptoms.

## 2018-02-03 NOTE — ED Provider Notes (Signed)
[facility] EMERGENCY DEPARTMENT Provider Note   CSN: [id number] Arrival date & time: 02/03/18  7017     History   Chief Complaint Chief Complaint  Patient presents with  . Chest Pain    HPI Ethan Pitts is a 58 y.o. male.  HPI Patient works in Garment/textile technologist.  He started to get signals from his iWatch that he had irregular and elevated heart rhythm.  He took his rhythm strip on 1\31 and it was atrial fibrillation.  He had taken an earlier strip on 1\16 and it was normal.  Patient denies he has had any symptoms.  He denies he has had increased shortness of breath, chest pain or lightheadedness.  He began monitoring these things based on the feedback from his iWatch.  No history of atrial fibrillation. Past Medical History:  Diagnosis Date  . Anxiety   . Arthritis    lower back   . Depression   . Diabetes mellitus   . GERD (gastroesophageal reflux disease)   . Hyperlipidemia   . Hypertension   . Hypogonadism, male 05/2015  . Increased prostate specific antigen (PSA) velocity 2019   06/2017 velocity up; recheck 10/2017 back to baseline.  Plan repeat 1 yr.  . Low back pain 2016   Left lumbar radiculopathy, lumbar spondylolisthesis.  Sciatica episode 03/2016.  Ortho getting L spine MRI as of 07/2016.  . Microcytic anemia 12/2015   suspect iron def due to malabsorption.  Hemoccults NEG 01/26/16, ferrous sulfate started.  . Neuropathic pain of left foot   . OSA on CPAP    cpap settings at 3    Patient Active Problem List   Diagnosis Date Noted  . Microcytic anemia 12/03/2015  . Hypogonadism, male 05/03/2015  . Diabetes mellitus without complication ([place]) 79/39/0300  . Insomnia 03/05/2014  . Encounter for vitamin deficiency screening 02/26/2014  . Preventative health care 02/26/2014  . BMI 37.0-37.9, adult 02/26/2014  . Lumbar pain with radiation down left leg 02/26/2014  . 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 DEGENERATIVE JOINT DISEASE  05/03/2009  . DM type 2 (diabetes mellitus, type 2) ([place]) 02/17/2009  . Hyperlipidemia 02/17/2009  . OBSTRUCTIVE SLEEP APNEA 08/23/2007  . Essential hypertension 05/22/2006    Past Surgical History:  Procedure Laterality Date  . ADENOIDECTOMY    . BREATH TEK H PYLORI N/A 01/08/2013   Procedure: BREATH TEK H PYLORI;  Surgeon: [name], MD;  Location: [facility];  Service: General;  Laterality: N/A;  . COLONOSCOPY  04/17/2014   Benign polyp x 1.  Recall 10 yrs.  Marland Kitchen GASTRIC ROUX-EN-Y N/A 04/14/2013   Procedure: LAPAROSCOPIC ROUX-EN-Y GASTRIC BYPASS WITH UPPER ENDOSCOPY;  Surgeon: [name], MD;  Location: [facility];  Service: General;  Laterality: N/A;  . TONSILLECTOMY          Home Medications    Prior to Admission medications   Medication Sig Start Date End Date Taking? Authorizing Provider  amLODipine (NORVASC) 5 MG tablet TAKE 1 TABLET BY MOUTH ONCE DAILY 01/18/18  Yes McGowen, Adrian Blackwater, MD  benazepril (LOTENSIN) 40 MG tablet TAKE 1 TABLET BY MOUTH IN THE MORNING 01/14/18  Yes McGowen, Adrian Blackwater, MD  benzonatate (TESSALON) 200 MG capsule Take 1 capsule (200 mg total) by mouth 2 (two) times daily as needed for cough. 10/09/17  Yes McGowen, Adrian Blackwater, MD  GLIPIZIDE XL 5 MG 24 hr tablet TAKE 1 TABLET BY MOUTH ONCE DAILY WITH BREAKFAST 01/14/18  Yes McGowen, Arnette Norris  H, MD  metFORMIN (GLUCOPHAGE) 1000 MG tablet TAKE 1 TABLET BY MOUTH TWICE DAILY WITH A MEAL 11/12/17  Yes McGowen, Adrian Blackwater, MD  pregabalin (LYRICA) 150 MG capsule TAKE 1 CAPSULE BY MOUTH TWICE DAILY 12/20/17  Yes McGowen, Adrian Blackwater, MD  VICTOZA 18 MG/3ML SOPN INJECT 1.2MG  TOTAL INTO THE SKIN 01/14/18  Yes McGowen, Adrian Blackwater, MD  zaleplon (SONATA) 5 MG capsule TAKE 1 CAPSULE BY MOUTH AT BEDTIME AS NEEDED FOR SLEEP 09/07/17  Yes McGowen, Adrian Blackwater, MD  ALPRAZolam (XANAX) 0.5 MG tablet TAKE 1 TABLET BY MOUTH EVERY 8 HOURS AS NEEDED FOR SLEEP OR ANXIETY 10/09/17   McGowen, Adrian Blackwater, MD  atorvastatin (LIPITOR) 40 MG tablet Take 1  tablet (40 mg total) by mouth daily. Patient not taking: Reported on 10/09/2017 06/27/16   Tammi Sou, MD  furosemide (LASIX) 40 MG tablet Take 1 tablet (40 mg total) by mouth daily. 02/03/18   Charlesetta Shanks, MD  glucose blood (BAYER CONTOUR NEXT TEST) test strip 1 each by Other route daily. And lancets qd 05/07/13   Renato Shin, MD  Insulin Pen Needle 31G X 5 MM MISC Use with Victoza as directed 01/15/18   McGowen, Adrian Blackwater, MD  metoprolol tartrate (LOPRESSOR) 50 MG tablet Take 1 tablet (50 mg total) by mouth 2 (two) times daily. 02/03/18   Charlesetta Shanks, MD  Omega-3 Fatty Acids (FISH OIL) 500 MG CAPS Take 1 capsule by mouth 2 (two) times daily.    [provider]  rivaroxaban (XARELTO) 20 MG TABS tablet Take 1 tablet (20 mg total) by mouth daily with supper. 02/03/18   Charlesetta Shanks, MD  testosterone cypionate (DEPOTESTOSTERONE CYPIONATE) 200 MG/ML injection 1 ml IM q 14 days 01/16/18   McGowen, Adrian Blackwater, MD    Family History Family History  Problem Relation Age of Onset  . Heart disease Father        valvular disease  . COPD Father   . Prostate cancer Father   . Arrhythmia Father   . Colon cancer Neg Hx     Social History Social History   Tobacco Use  . Smoking status: Never Smoker  . Smokeless tobacco: Former Systems developer    Types: Chew  Substance Use Topics  . Alcohol use: Yes    Alcohol/week: 12.0 standard drinks    Types: 12 Cans of beer per week    Comment:  12pk/per week  . Drug use: No     Allergies   Patient has no known allergies.   Review of Systems Review of Systems 10 Systems reviewed and are negative for acute change except as noted in the HPI.   Physical Exam Updated Vital Signs BP 125/72   Pulse 94   Temp 98.5 F (36.9 C) (Oral)   Resp 18   Ht 6' (1.829 m)   Wt 133.8 kg   SpO2 92%   BMI 40.01 kg/m   Physical Exam Constitutional:      Appearance: He is well-developed.  HENT:     Head: Normocephalic and atraumatic.  Eyes:      Pupils: Pupils are equal, round, and reactive to light.  Neck:     Musculoskeletal: Neck supple.  Cardiovascular:     Rate and Rhythm: Tachycardia present. Rhythm irregular.     Pulses: Normal pulses.     Heart sounds: Normal heart sounds.  Pulmonary:     Effort: Pulmonary effort is normal.     Breath sounds: Normal breath sounds.  Abdominal:  General: Bowel sounds are normal. There is no distension.     Palpations: Abdomen is soft.     Tenderness: There is no abdominal tenderness.  Musculoskeletal: Normal range of motion.     Comments: 1+ pitting edema bilateral lower extremities.  Calf soft and nontender.  Skin:    General: Skin is warm and dry.  Neurological:     Mental Status: He is alert and oriented to person, place, and time.     GCS: GCS eye subscore is 4. GCS verbal subscore is 5. GCS motor subscore is 6.     Coordination: Coordination normal.      ED Treatments / Results  Labs (all labs ordered are listed, but only abnormal results are displayed) Labs Reviewed  COMPREHENSIVE METABOLIC PANEL - Abnormal; Notable for the following components:      Result Value   Sodium 131 (*)    Glucose, Bld 222 (*)    Calcium 8.6 (*)    All other components within normal limits  MAGNESIUM - Abnormal; Notable for the following components:   Magnesium 1.6 (*)    All other components within normal limits  BRAIN NATRIURETIC PEPTIDE - Abnormal; Notable for the following components:   B Natriuretic Peptide 334.5 (*)    All other components within normal limits  CBC WITH DIFFERENTIAL/PLATELET - Abnormal; Notable for the following components:   WBC 2.8 (*)    Platelets 122 (*)    Neutro Abs 1.5 (*)    All other components within normal limits  PHOSPHORUS - Abnormal; Notable for the following components:   Phosphorus 1.9 (*)    All other components within normal limits  URINALYSIS, ROUTINE W REFLEX MICROSCOPIC - Abnormal; Notable for the following components:   Glucose, UA >=500  (*)    All other components within normal limits  URINALYSIS, MICROSCOPIC (REFLEX) - Abnormal; Notable for the following components:   Bacteria, UA RARE (*)    All other components within normal limits  TROPONIN I  PROTIME-INR  TROPONIN I  TSH    EKG EKG Interpretation  Date/Time:  Sunday February 03 2018 09:53:08 EST Ventricular Rate:  117 PR Interval:    QRS Duration: 126 QT Interval:  351 QTC Calculation: 480 R Axis:   -49 Text Interpretation:  Atrial flutter Nonspecific IVCD with LAD ST elevation, consider inferior injury possible flutter, NO STEMI. Confirmed by Charlesetta Shanks [phone]) on 02/03/2018 10:06:04 AM   Radiology Dg Chest 2 View  Result Date: 02/03/2018 CLINICAL DATA:  Chest pain, shortness of breath EXAM: CHEST - 2 VIEW COMPARISON:  01/22/2013 FINDINGS: Lungs are clear.  No pleural effusion or pneumothorax. The heart is normal in size. Degenerative changes of the mid/lower thoracic spine. IMPRESSION: Normal chest radiographs. Electronically Signed   By: Julian Hy M.D.   On: 02/03/2018 10:58    Procedures Procedures (including critical care time) CHA2DS2/VAS Stroke Risk Points  Current as of 41 minutes ago     2 >= 2 Points: High Risk  1 - 1.99 Points: Medium Risk  0 Points: Low Risk    The patient's score has not changed in the past year.:  No Change     Details    This score determines the patient's risk of having a stroke if the  patient has atrial fibrillation.       Points Metrics  0 Has Congestive Heart Failure:  No    Current as of 41 minutes ago  0 Has Vascular Disease:  No  Current as of 41 minutes ago  1 Has Hypertension:  Yes    Current as of 41 minutes ago  0 Age:  20    Current as of 41 minutes ago  1 Has Diabetes:  Yes    Current as of 41 minutes ago  0 Had Stroke:  No  Had TIA:  No  Had thromboembolism:  No    Current as of 41 minutes ago  0 Male:  No    Current as of 41 minutes ago     CRITICAL CARE Performed by: Charlesetta Shanks   Total critical care time: 45 minutes  Critical care time was exclusive of separately billable procedures and treating other patients.  Critical care was necessary to treat or prevent imminent or life-threatening deterioration.  Critical care was time spent personally by me on the following activities: development of treatment plan with patient and/or surrogate as well as nursing, discussions with consultants, evaluation of patient's response to treatment, examination of patient, obtaining history from patient or surrogate, ordering and performing treatments and interventions, ordering and review of laboratory studies, ordering and review of radiographic studies, pulse oximetry and re-evaluation of patient's condition.     Medications Ordered in ED Medications  aspirin chewable tablet 324 mg (324 mg Oral Given 02/03/18 1036)  metoprolol tartrate (LOPRESSOR) injection 5 mg (5 mg Intravenous Given 02/03/18 1037)  metoprolol tartrate (LOPRESSOR) injection 5 mg (5 mg Intravenous Given 02/03/18 1304)  rivaroxaban (XARELTO) tablet 20 mg (20 mg Oral Given 02/03/18 1344)  metoprolol tartrate (LOPRESSOR) tablet 25 mg (25 mg Oral Given 02/03/18 1344)     Initial Impression / Assessment and Plan / ED Course  I have reviewed the triage vital signs and the nursing notes.  Pertinent labs & imaging results that were available during my care of the patient were reviewed by me and considered in my medical decision making (see chart for details).  Clinical Course as of Feb 03 1426  Sun Feb 03, 2018  1333 Consult: Reviewed with Dr. Oval Linsey.  May start patient on Xarelto, metoprolol 50 mg twice daily, Lasix 40 mg daily, discontinue Coreg, continue amlodipine and Benzapril.  Will be called tomorrow morning to come into A. fib clinic.   [MP]    Clinical Course User Index [MP] Charlesetta Shanks, MD    Patient presents with new onset of atrial fib\flutter.  At higher rate appears to be combination of fib  and flutter but when rate control more consistent with flutter.  Patient identifies symptoms by his iPhone.  He did not have any symptoms that he was aware of.  Did not have lightheadedness or syncope.  He did not perceive palpitations.  Patient has been rate controlled with Lopressor.  He does appear to be slightly volume overloaded with trace peripheral edema.  After consultation with cardiology patient will be started on Xarelto, Lopressor and Lasix.  He will be contacted in the morning by A. fib clinic to continue diagnostic evaluation and management.  Return precautions reviewed.  Final Clinical Impressions(s) / ED Diagnoses   Final diagnoses:  Atrial fibrillation, new onset ([place])  Atrial flutter, unspecified type [facility])    ED Discharge Orders         Ordered    rivaroxaban (XARELTO) 20 MG TABS tablet  Daily with supper     02/03/18 1424    metoprolol tartrate (LOPRESSOR) 50 MG tablet  2 times daily,   Status:  Discontinued     02/03/18 1424  furosemide (LASIX) 40 MG tablet  Daily     02/03/18 1424    metoprolol tartrate (LOPRESSOR) 50 MG tablet  2 times daily     02/03/18 1428           Charlesetta Shanks, MD 02/03/18 [phone]

## 2018-02-03 NOTE — ED Notes (Signed)
Patient transported to X-ray 

## 2018-02-03 NOTE — ED Notes (Signed)
ED Provider at bedside. 

## 2018-02-03 NOTE — ED Triage Notes (Signed)
Pt reports intermittent chest pain x 2 week with SOB on exertion. 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 No history of this. Also reports BP has been running high.

## 2018-02-04 NOTE — Progress Notes (Signed)
Primary Care Physician: Tammi Sou, MD  Referring Physician: [facility]   Ethan Pitts is a 58 y.o. male with a history of atrial flutter, OSA, HTN, DM who presents for consultation in the [facility].  The patient was initially diagnosed with atrial flutter 02/03/18 when his Apple Watch showed that he had an irregular rhythm. Patient works in ambulance transport and did a rhythm strip which read as atrial fibrillation. He had no awareness of his arrhythmia and denies SOB, chest pain, fatigue, or lightheadedness. At the ER, ECG showed atrial flutter with variable conduction. Today, he does state that he may some mild SOB.  Today, he denies symptoms of palpitations, chest pain, orthopnea, PND, lower extremity edema, dizziness, presyncope, syncope, snoring, daytime somnolence, bleeding, or neurologic sequela. The patient is tolerating medications without difficulties and is otherwise without complaint today.    Atrial Fibrillation Risk Factors:  he does have symptoms or diagnosis of sleep apnea. he is compliant with CPAP therapy.  he does not have a history of rheumatic fever.  he does not have a history of significant alcohol use.  he has a BMI of Body mass index is 40.14 kg/m.Marland Kitchen Filed Weights   02/05/18 1021  Weight: 134.3 kg     Atrial Fibrillation Management history:  Previous antiarrhythmic drugs: none  Previous cardioversions: none  Previous ablations: none  CHADS2VASC score: 2 (DM, HTN)  Anticoagulation history: Xarelto   Past Medical History:  Diagnosis Date  . Anxiety   . Arthritis    lower back   . Depression   . Diabetes mellitus   . GERD (gastroesophageal reflux disease)   . Hyperlipidemia   . Hypertension   . Hypogonadism, male 05/2015  . Increased prostate specific antigen (PSA) velocity 2019   06/2017 velocity up; recheck 10/2017 back to baseline.  Plan repeat 1 yr.  . Low back pain 2016   Left lumbar  radiculopathy, lumbar spondylolisthesis.  Sciatica episode 03/2016.  Ortho getting L spine MRI as of 07/2016.  . Microcytic anemia 12/2015   suspect iron def due to malabsorption.  Hemoccults NEG 01/26/16, ferrous sulfate started.  . Neuropathic pain of left foot   . OSA on CPAP    cpap settings at 3   Past Surgical History:  Procedure Laterality Date  . ADENOIDECTOMY    . BREATH TEK H PYLORI N/A 01/08/2013   Procedure: BREATH TEK H PYLORI;  Surgeon: [name], MD;  Location: [facility];  Service: General;  Laterality: N/A;  . COLONOSCOPY  04/17/2014   Benign polyp x 1.  Recall 10 yrs.  Marland Kitchen GASTRIC ROUX-EN-Y N/A 04/14/2013   Procedure: LAPAROSCOPIC ROUX-EN-Y GASTRIC BYPASS WITH UPPER ENDOSCOPY;  Surgeon: [name], MD;  Location: [facility];  Service: General;  Laterality: N/A;  . TONSILLECTOMY      Current Outpatient Medications  Medication Sig Dispense Refill  . amLODipine (NORVASC) 5 MG tablet TAKE 1 TABLET BY MOUTH ONCE DAILY 90 tablet 1  . benazepril (LOTENSIN) 40 MG tablet TAKE 1 TABLET BY MOUTH IN THE MORNING 90 tablet 0  . benzonatate (TESSALON) 200 MG capsule Take 1 capsule (200 mg total) by mouth 2 (two) times daily as needed for cough. 20 capsule 0  . furosemide (LASIX) 40 MG tablet Take 1 tablet (40 mg total) by mouth daily. 30 tablet 0  . GLIPIZIDE XL 5 MG 24 hr tablet TAKE 1 TABLET BY MOUTH ONCE DAILY WITH BREAKFAST 90 tablet 0  .  glucose blood (BAYER CONTOUR NEXT TEST) test strip 1 each by Other route daily. And lancets qd 100 each 12  . Insulin Pen Needle 31G X 5 MM MISC Use with Victoza as directed 100 each 11  . metFORMIN (GLUCOPHAGE) 1000 MG tablet TAKE 1 TABLET BY MOUTH TWICE DAILY WITH A MEAL 180 tablet 1  . metoprolol tartrate (LOPRESSOR) 50 MG tablet Take 1 tablet (50 mg total) by mouth 2 (two) times daily. 60 tablet 0  . Omega-3 Fatty Acids (FISH OIL) 500 MG CAPS Take 1 capsule by mouth 2 (two) times daily.    . pregabalin (LYRICA) 150 MG capsule TAKE 1  CAPSULE BY MOUTH TWICE DAILY 60 capsule 5  . rivaroxaban (XARELTO) 20 MG TABS tablet Take 1 tablet (20 mg total) by mouth daily with supper. 30 tablet 0  . VICTOZA 18 MG/3ML SOPN INJECT 1.2MG  TOTAL INTO THE SKIN 6 mL 0  . zaleplon (SONATA) 5 MG capsule TAKE 1 CAPSULE BY MOUTH AT BEDTIME AS NEEDED FOR SLEEP 90 capsule 1  . ALPRAZolam (XANAX) 0.5 MG tablet TAKE 1 TABLET BY MOUTH EVERY 8 HOURS AS NEEDED FOR SLEEP OR ANXIETY (Patient not taking: Reported on 02/05/2018) 90 tablet 5  . testosterone cypionate (DEPOTESTOSTERONE CYPIONATE) 200 MG/ML injection 1 ml IM q 14 days (Patient not taking: Reported on 02/05/2018) 10 mL 0   No current facility-administered medications for this encounter.     No Known Allergies  Social History   Socioeconomic History  . Marital status: Single    Spouse name: Not on file  . Number of children: Not on file  . Years of education: Not on file  . Highest education level: Not on file  Occupational History  . Occupation: Paramedic-EMT    Employer: [employer]  . Financial resource strain: Not on file  . Food insecurity:    Worry: Not on file    Inability: Not on file  . Transportation needs:    Medical: Not on file    Non-medical: Not on file  Tobacco Use  . Smoking status: Never Smoker  . Smokeless tobacco: Former Systems developer    Types: Chew  Substance and Sexual Activity  . Alcohol use: Yes    Alcohol/week: 12.0 standard drinks    Types: 12 Cans of beer per week    Comment:  12pk/per week  . Drug use: No  . Sexual activity: Not on file  Lifestyle  . Physical activity:    Days per week: Not on file    Minutes per session: Not on file  . Stress: Not on file  Relationships  . Social connections:    Talks on phone: Not on file    Gets together: Not on file    Attends religious service: Not on file    Active member of club or organization: Not on file    Attends meetings of clubs or organizations: Not on file    Relationship  status: Not on file  . Intimate partner violence:    Fear of current or ex partner: Not on file    Emotionally abused: Not on file    Physically abused: Not on file    Forced sexual activity: Not on file  Other Topics Concern  . Not on file  Social History Narrative   Married, no children.   Occupation: paramedic    Family History  Problem Relation Age of Onset  . Heart disease Father  valvular disease  . COPD Father   . Prostate cancer Father   . Arrhythmia Father   . Colon cancer Neg Hx    The patient does not have a history of early familial atrial fibrillation or other arrhythmias.  ROS- All systems are reviewed and negative except as per the HPI above.  Physical Exam: Vitals:   02/05/18 1021  BP: 118/62  Pulse: (!) 108  Weight: 134.3 kg  Height: 6' (1.829 m)    GEN- The patient is well appearing, alert and oriented x 3 today.   Head- normocephalic, atraumatic Eyes-  Sclera clear, conjunctiva pink Ears- hearing intact Oropharynx- clear Neck- supple  Lungs- Clear to ausculation bilaterally, normal work of breathing Heart- irregular rate and rhythm, no murmurs, rubs or gallops  GI- soft, NT, ND, + BS, obese Extremities- no clubbing, cyanosis, or edema MS- no significant deformity or atrophy Skin- no rash or lesion Psych- euthymic mood, full affect Neuro- strength and sensation are intact  Wt Readings from Last 3 Encounters:  02/05/18 134.3 kg  02/03/18 133.8 kg  10/09/17 134.4 kg    EKG today demonstrates atrial flutter with variable conduction, HR 108, LAD, QRS 114, QTc 517  Epic records are reviewed at length today  Assessment and Plan:  1. Typical atrial flutter Patient has asymptomatic atrial flutter. At ER, there was a question of afib but when the HR was slowed, telemetry showed atrial flutter. He is asymptomatic.  Will arrange DCCV when he has been on anticoagulation at least 3 weeks. Started Xarelto 02/04/18. Will order  echocardiogram Continue Xarelto 20 mg daily.  Continue metoprolol 50 mg BID.  This patients CHA2DS2-VASc Score and unadjusted Ischemic Stroke Rate (% per year) is equal to 2.2 % stroke rate/year from a score of 2  Above score calculated as 1 point each if present [CHF, HTN, DM, Vascular=MI/PAD/Aortic Plaque, Age if 65-74, or Male] Above score calculated as 2 points each if present [Age > 75, or Stroke/TIA/TE]  2. Obesity As above, lifestyle modification was discussed at length including regular exercise and weight reduction. Body mass index is 40.14 kg/m.  3. Obstructive sleep apnea The importance of adequate treatment of sleep apnea was discussed today in order to improve our ability to maintain sinus rhythm long term. Compliant with CPAP therapy.  4. HTN Stable, no changes today.  Follow up in two weeks in Afib clinic for pre DCCV labs and H&P.  Audry Pili Fenton PA-C 02/05/2018 10:56 AM

## 2018-02-05 ENCOUNTER — Encounter (HOSPITAL_COMMUNITY): Payer: Self-pay | Admitting: Physician Assistant

## 2018-02-05 ENCOUNTER — Ambulatory Visit (HOSPITAL_COMMUNITY)
Admission: RE | Admit: 2018-02-05 | Discharge: 2018-02-05 | Disposition: A | Discharge status: Home / Self Care | Payer: Commercial Managed Care - PPO | Source: Ambulatory Visit | Attending: Physician Assistant | Admitting: Physician Assistant

## 2018-02-05 VITALS — BP 118/62 | HR 108 | Ht 72.0 in | Wt 296.0 lb

## 2018-02-05 DIAGNOSIS — R9431 Abnormal electrocardiogram [ECG] [EKG]: Secondary | ICD-10-CM | POA: Diagnosis not present

## 2018-02-05 DIAGNOSIS — E669 Obesity, unspecified: Secondary | ICD-10-CM | POA: Insufficient documentation

## 2018-02-05 DIAGNOSIS — M5416 Radiculopathy, lumbar region: Secondary | ICD-10-CM | POA: Diagnosis not present

## 2018-02-05 DIAGNOSIS — I483 Typical atrial flutter: Secondary | ICD-10-CM

## 2018-02-05 DIAGNOSIS — Z8042 Family history of malignant neoplasm of prostate: Secondary | ICD-10-CM | POA: Diagnosis not present

## 2018-02-05 DIAGNOSIS — Z7901 Long term (current) use of anticoagulants: Secondary | ICD-10-CM | POA: Diagnosis not present

## 2018-02-05 DIAGNOSIS — Z794 Long term (current) use of insulin: Secondary | ICD-10-CM | POA: Diagnosis not present

## 2018-02-05 DIAGNOSIS — I10 Essential (primary) hypertension: Secondary | ICD-10-CM | POA: Diagnosis not present

## 2018-02-05 DIAGNOSIS — I4819 Other persistent atrial fibrillation: Secondary | ICD-10-CM | POA: Diagnosis not present

## 2018-02-05 DIAGNOSIS — Z6841 Body Mass Index (BMI) 40.0 and over, adult: Secondary | ICD-10-CM | POA: Insufficient documentation

## 2018-02-05 DIAGNOSIS — Z8249 Family history of ischemic heart disease and other diseases of the circulatory system: Secondary | ICD-10-CM | POA: Insufficient documentation

## 2018-02-05 DIAGNOSIS — I4892 Unspecified atrial flutter: Secondary | ICD-10-CM | POA: Insufficient documentation

## 2018-02-05 DIAGNOSIS — E119 Type 2 diabetes mellitus without complications: Secondary | ICD-10-CM | POA: Insufficient documentation

## 2018-02-05 DIAGNOSIS — G4733 Obstructive sleep apnea (adult) (pediatric): Secondary | ICD-10-CM | POA: Insufficient documentation

## 2018-02-05 NOTE — Patient Instructions (Addendum)
Cardioversion scheduled for Friday, March 6th  - Arrive at the [facility] and go to admitting at [facility] not eat or drink anything after midnight the night prior to your procedure.  - Take all your morning medication with a sip of water prior to arrival except for diabetes  - You will not be able to drive home after your procedure.

## 2018-02-08 ENCOUNTER — Other Ambulatory Visit: Payer: Self-pay | Admitting: Family Medicine

## 2018-02-11 ENCOUNTER — Other Ambulatory Visit: Payer: Self-pay | Admitting: Family Medicine

## 2018-02-12 ENCOUNTER — Encounter: Payer: Self-pay | Admitting: Family Medicine

## 2018-02-12 ENCOUNTER — Ambulatory Visit (INDEPENDENT_AMBULATORY_CARE_PROVIDER_SITE_OTHER): Payer: Commercial Managed Care - PPO | Admitting: Family Medicine

## 2018-02-12 VITALS — BP 125/82 | HR 110 | Temp 98.0°F | Resp 16 | Ht 72.0 in | Wt 297.1 lb

## 2018-02-12 DIAGNOSIS — I4892 Unspecified atrial flutter: Secondary | ICD-10-CM | POA: Insufficient documentation

## 2018-02-12 DIAGNOSIS — J209 Acute bronchitis, unspecified: Secondary | ICD-10-CM

## 2018-02-12 DIAGNOSIS — R05 Cough: Secondary | ICD-10-CM | POA: Diagnosis not present

## 2018-02-12 DIAGNOSIS — R059 Cough, unspecified: Secondary | ICD-10-CM

## 2018-02-12 LAB — POC INFLUENZA A&B (BINAX/QUICKVUE)
Influenza A, POC: NEGATIVE
Influenza B, POC: NEGATIVE

## 2018-02-12 MED ORDER — BENZONATATE 100 MG PO CAPS: 100.0000 mg | ORAL_CAPSULE | Freq: Two times a day (BID) | ORAL | 0 refills | Status: DC | PRN

## 2018-02-12 MED ORDER — DOXYCYCLINE HYCLATE 100 MG PO TABS: 100.0000 mg | ORAL_TABLET | Freq: Two times a day (BID) | ORAL | 0 refills | Status: DC

## 2018-02-12 MED ORDER — BENZONATATE 200 MG PO CAPS: 200.0000 mg | ORAL_CAPSULE | Freq: Two times a day (BID) | ORAL | 0 refills | Status: DC | PRN

## 2018-02-12 NOTE — Patient Instructions (Addendum)
Rest, hydrate.  + flonase, mucinex (DM if cough), nettie pot or nasal saline.  doxycyline and tessalon perles prescribed, take until completed.  If cough present it can last up to 6-8 weeks.  F/U 2 weeks of not improved.      Acute Bronchitis, Adult Acute bronchitis is when air tubes (bronchi) in the lungs suddenly get swollen. The condition can make it hard to breathe. It can also cause these symptoms:  A cough.  Coughing up clear, yellow, or green mucus.  Wheezing.  Chest congestion.  Shortness of breath.  A fever.  Body aches.  Chills.  A sore throat. Follow these instructions at home:  Medicines  Take over-the-counter and prescription medicines only as told by your doctor.  If you were prescribed an antibiotic medicine, take it as told by your doctor. Do not stop taking the antibiotic even if you start to feel better. General instructions  Rest.  Drink enough fluids to keep your pee (urine) pale yellow.  Avoid smoking and secondhand smoke. If you smoke and you need help quitting, ask your doctor. Quitting will help your lungs heal faster.  Use an inhaler, cool mist vaporizer, or humidifier as told by your doctor.  Keep all follow-up visits as told by your doctor. This is important. How is this prevented? To lower your risk of getting this condition again:  Wash your hands often with soap and water. If you cannot use soap and water, use hand sanitizer.  Avoid contact with people who have cold symptoms.  Try not to touch your hands to your mouth, nose, or eyes.  Make sure to get the flu shot every year. Contact a doctor if:  Your symptoms do not get better in 2 weeks. Get help right away if:  You cough up blood.  You have chest pain.  You have very bad shortness of breath.  You become dehydrated.  You faint (pass out) or keep feeling like you are going to pass out.  You keep throwing up (vomiting).  You have a very bad headache.  Your  fever or chills gets worse. This information is not intended to replace advice given to you by your health care provider. Make sure you discuss any questions you have with your health care provider. Document Released: 06/07/2007 Document Revised: 08/02/2016 Document Reviewed: 06/09/2015 Elsevier Interactive Patient Education  2019 [employer].

## 2018-02-12 NOTE — Telephone Encounter (Signed)
I will authorize one RF of this med, BUT if he is having any wheezing or feeling of shortness of breath then he should come in to be seen.-thx

## 2018-02-12 NOTE — Telephone Encounter (Signed)
Pt was seen in office today by Dr. Raoul Pitch.

## 2018-02-12 NOTE — Progress Notes (Signed)
Ethan Pitts , 1960/06/02, 58 y.o., male MRN: [id number] Patient Care Team    Relationship Specialty Notifications Start End  Mc[place]owen, Adrian Blackwater, MD PCP - General Family Medicine  02/26/14    Comment: Dr. Linna Darner retirement  [place], Armando Reichert, MD Consulting Physician Pulmonary Disease  04/04/13   Zonia Kief, MD Consulting Physician Rehabilitation  09/14/14   Melina Schools, MD Consulting Physician Orthopedic Surgery  07/17/16   Milus Banister, MD Consulting Physician Gastroenterology  09/21/16     Chief Complaint  Patient presents with  . Cough    x4-5 days. Non productive cough. Pts dad is in the hospital with pneumonia and expected to not live   . Generalized Body Aches     Subjective: Pt presents for an OV with complaints of cough writer then 10 days, that is getting worse over the last 4-5 days duration.  Patient being seen by this provider today secondary to PCP,  Dr. Anitra Lauth, without availability.  Associated symptoms include body aches, nonproductive cough, hoarseness, chills and fatigue.  He had his flu shot this season.  He has been visiting his father who is in the hospital with pneumonia.  Patient does report he wears his CPAP nightly.  He is a diabetic.  He was recently diagnosed with an arrhythmia-atrial fib/flutter, started on Xarelto and Lopressor.  He has cardiology follow-up.  Depression screen [facility] 2/9 01/12/2016 01/22/2013  Decreased Interest 0 2  Down, Depressed, Hopeless 0 2  PHQ - 2 Score 0 4  Altered sleeping - 0  Tired, decreased energy - 1  Change in appetite - 2  Feeling bad or failure about yourself  - 0  Trouble concentrating - 2  Moving slowly or fidgety/restless - 0  Suicidal thoughts - 0  PHQ-9 Score - 9    No Known Allergies Social History   Social History Narrative   Married, no children.   Occupation: paramedic   Past Medical History:  Diagnosis Date  . Anxiety   . Arthritis    lower back   . Depression   . Diabetes mellitus   .  GERD (gastroesophageal reflux disease)   . Hyperlipidemia   . Hypertension   . Hypogonadism, male 05/2015  . Increased prostate specific antigen (PSA) velocity 2019   06/2017 velocity up; recheck 10/2017 back to baseline.  Plan repeat 1 yr.  . Low back pain 2016   Left lumbar radiculopathy, lumbar spondylolisthesis.  Sciatica episode 03/2016.  Ortho getting L spine MRI as of 07/2016.  . Microcytic anemia 12/2015   suspect iron def due to malabsorption.  Hemoccults NEG 01/26/16, ferrous sulfate started.  . Neuropathic pain of left foot   . OSA on CPAP    cpap settings at 3   Past Surgical History:  Procedure Laterality Date  . ADENOIDECTOMY    . BREATH TEK H PYLORI N/A 01/08/2013   Procedure: BREATH TEK H PYLORI;  Surgeon: [name], MD;  Location: [facility];  Service: General;  Laterality: N/A;  . COLONOSCOPY  04/17/2014   Benign polyp x 1.  Recall 10 yrs.  Marland Kitchen GASTRIC ROUX-EN-Y N/A 04/14/2013   Procedure: LAPAROSCOPIC ROUX-EN-Y GASTRIC BYPASS WITH UPPER ENDOSCOPY;  Surgeon: [name], MD;  Location: [facility];  Service: General;  Laterality: N/A;  . TONSILLECTOMY     Family History  Problem Relation Age of Onset  . Heart disease Father        valvular disease  . COPD Father   .  Prostate cancer Father   . Arrhythmia Father   . Colon cancer Neg Hx    Allergies as of 02/12/2018   No Known Allergies     Medication List       Accurate as of February 12, 2018  7:02 PM. Always use your most recent med list.        ALPRAZolam 0.5 MG tablet Commonly known as:  XANAX TAKE 1 TABLET BY MOUTH EVERY 8 HOURS AS NEEDED FOR SLEEP OR ANXIETY   amLODipine 5 MG tablet Commonly known as:  NORVASC TAKE 1 TABLET BY MOUTH ONCE DAILY   benazepril 40 MG tablet Commonly known as:  LOTENSIN TAKE 1 TABLET BY MOUTH IN THE MORNING   benzonatate 100 MG capsule Commonly known as:  TESSALON Take 1 capsule (100 mg total) by mouth 2 (two) times daily as needed for cough.   benzonatate  200 MG capsule Commonly known as:  TESSALON Take 1 capsule (200 mg total) by mouth 2 (two) times daily as needed for cough.   doxycycline 100 MG tablet Commonly known as:  VIBRA-TABS Take 1 tablet (100 mg total) by mouth 2 (two) times daily.   Fish Oil 500 MG Caps Take 1 capsule by mouth 2 (two) times daily.   furosemide 40 MG tablet Commonly known as:  LASIX Take 1 tablet (40 mg total) by mouth daily.   GLIPIZIDE XL 5 MG 24 hr tablet Generic drug:  glipiZIDE TAKE 1 TABLET BY MOUTH ONCE DAILY WITH BREAKFAST   glucose blood test strip Commonly known as:  BAYER CONTOUR NEXT TEST 1 each by Other route daily. And lancets qd   Insulin Pen Needle 31G X 5 MM Misc Use with Victoza as directed   metFORMIN 1000 MG tablet Commonly known as:  GLUCOPHAGE TAKE 1 TABLET BY MOUTH TWICE DAILY WITH A MEAL   metoprolol tartrate 50 MG tablet Commonly known as:  LOPRESSOR Take 1 tablet (50 mg total) by mouth 2 (two) times daily.   pregabalin 150 MG capsule Commonly known as:  LYRICA TAKE 1 CAPSULE BY MOUTH TWICE DAILY   rivaroxaban 20 MG Tabs tablet Commonly known as:  XARELTO Take 1 tablet (20 mg total) by mouth daily with supper.   testosterone cypionate 200 MG/ML injection Commonly known as:  DEPOTESTOSTERONE CYPIONATE 1 ml IM q 14 days   VICTOZA 18 MG/3ML Sopn Generic drug:  liraglutide INJECT 1.2MG  TOTAL INTO THE SKIN   zaleplon 5 MG capsule Commonly known as:  SONATA TAKE 1 CAPSULE BY MOUTH AT BEDTIME AS NEEDED FOR SLEEP       All past medical history, surgical history, allergies, family history, immunizations andmedications were updated in the EMR today and reviewed under the history and medication portions of their EMR.     ROS: Negative, with the exception of above mentioned in HPI   Objective:  BP 125/82 (BP Location: Left Arm, Patient Position: Sitting, Cuff Size: Normal)   Pulse (!) 110   Temp 98 F (36.7 C) (Oral)   Resp 16   Ht 6' (1.829 m)   Wt 297 lb 2  oz (134.8 kg)   SpO2 96%   BMI 40.30 kg/m  Body mass index is 40.3 kg/m. Gen: Afebrile. No acute distress. Nontoxic in appearance, well developed, well nourished.  HENT: AT. [place]. Bilateral TM visualized without erythema or fullness. MMM, no oral lesions. Bilateral nares mild erythema, drainage and swelling. Throat without erythema or exudates.  Cough present.  Hoarseness present. Eyes:Pupils Equal Round  Reactive to light, Extraocular movements intact,  Conjunctiva without redness, discharge or icterus. Neck/lymp/endocrine: Supple, no lymphadenopathy CV: Rate and rhythm, no murmurs rubs or gallops, no edema Chest: CTAB, no wheeze or crackles. Good air movement, normal resp effort.  Skin: no rashes, purpura or petechiae.  Neuro: Normal gait. PERLA. EOMi. Alert. Oriented x3   No exam data present No results found. Results for orders placed or performed in visit on 02/12/18 (from the past 24 hour(s))  POC Influenza A&B(BINAX/QUICKVUE)     Status: Normal   Collection Time: 02/12/18  2:18 PM  Result Value Ref Range   Influenza A, POC Negative Negative   Influenza B, POC Negative Negative    Assessment/Plan: Ethan Pitts is a 58 y.o. male present for OV for  Cough/bronchitis >10 days -Influenza negative.  He is having worsening bronchitis over the last 5 days and exposure to pneumonia-elected to treat with doxycycline. - He is  tachycardic, however in review of the last few visits the ED and A. fib clinic his heart rate is consistent since being diagnosed with atrial flutter. - POC Influenza A&B(BINAX/QUICKVUE) Rest, hydrate.  + flonase, mucinex (DM if cough), nettie pot or nasal saline.  doxycyline and tessalon perles prescribed, take until completed.  If cough present it can last up to 6-8 weeks.  F/U 2 weeks of not improved- w/ pcp  Atrial flutter: -Diagnosed 02/03/2018 in the ED.  Patient reports he is taking the Xarelto.  Following up with A. Fib/flutter clinic.  DCCV  arranged for 03/08/2018 after being on Xarelto for 3 weeks. -Continue to follow with cardiology   Reviewed expectations re: course of current medical issues.  Discussed self-management of symptoms.  Outlined signs and symptoms indicating need for more acute intervention.  Patient verbalized understanding and all questions were answered.  Patient received an After-Visit Summary.    Orders Placed This Encounter  Procedures  . POC Influenza A&B(BINAX/QUICKVUE)    > 25 minutes spent with patient, >50% of time spent face to face counseling   Note is dictated utilizing voice recognition software. Although note has been proof read prior to signing, occasional typographical errors still can be missed. If any questions arise, please do not hesitate to call for verification.   electronically signed by:  Howard Pouch, DO  [place]

## 2018-02-18 ENCOUNTER — Other Ambulatory Visit: Payer: Self-pay | Admitting: Family Medicine

## 2018-02-18 ENCOUNTER — Ambulatory Visit: Payer: Self-pay | Admitting: *Deleted

## 2018-02-18 ENCOUNTER — Telehealth (HOSPITAL_COMMUNITY): Payer: Self-pay | Admitting: *Deleted

## 2018-02-18 DIAGNOSIS — I4819 Other persistent atrial fibrillation: Secondary | ICD-10-CM

## 2018-02-18 NOTE — Telephone Encounter (Signed)
Patient calls reported his cough continues to be non stop it is mostly non productive but phlegm is yellow and thick when some comes up.  His throat feels irritated.He was seen on 2/11 by Dr. Raoul Pitch and prescribed Ladona Ridgel which have not made much of a difference. Recently diagnosed with afib/aflutter and his hr has been elevated over the last 3 days to 130-140's-FYI he spoke with afib clinic just minutes ago and was told to double his lopressor and monitor hr. Heart rate is 112 bpm at this time. He denies chest pain. Stated he has been short of breath with the afib and continues to be (cards aware). Reviewed advice care per protocol for coughing. He is inquiring if there is something else that can be done for the cough. He has a scheduled OV with PCP on Friday 2/21, as well as echo that day. Routing to PCP for further advice re cough. Reason for Disposition . Cough has been present for > 3 weeks  Answer Assessment - Initial Assessment Questions 1. ONSET: "When did the cough begin?"      Approximately 2 1/2 weeks ago. Continues to worsen. 2. SEVERITY: "How bad is the cough today?"      Coughing all day  3. RESPIRATORY DISTRESS: "Describe your breathing."      no 4. FEVER: "Do you have a fever?" If so, ask: "What is your temperature, how was it measured, and when did it start?"     Not measured and unsure 5. HEMOPTYSIS: "Are you coughing up any blood?" If so ask: "How much?" (flecks, streaks, tablespoons, etc.)     no 6. TREATMENT: "What have you done so far to treat the cough?" (e.g., meds, fluids, humidifier)     Tessalon perles 7. CARDIAC HISTORY: "Do you have any history of heart disease?" (e.g., heart attack, congestive heart failure)      Recently dx with afib/aflutter 8. LUNG HISTORY: "Do you have any history of lung disease?"  (e.g., pulmonary embolus, asthma, emphysema)     no 9. PE RISK FACTORS: "Do you have a history of blood clots?" (or: recent major surgery, recent prolonged  travel, bedridden)     no 10. OTHER SYMPTOMS: "Do you have any other symptoms? (e.g., runny nose, wheezing, chest pain)       no 11. PREGNANCY: "Is there any chance you are pregnant?" "When was your last menstrual period?"      no 12. TRAVEL: "Have you traveled out of the country in the last month?" (e.g., travel history, exposures)      no  Protocols used: COUGH - ACUTE NON-PRODUCTIVE-A-AH

## 2018-02-18 NOTE — Telephone Encounter (Signed)
Left message for pt to call back.   Okay for [facility] to advise pt. 

## 2018-02-18 NOTE — Telephone Encounter (Signed)
There is nothing further that I can prescribe him for cough at this time.-thx

## 2018-02-18 NOTE — Telephone Encounter (Signed)
Patient called in stating his HR is staying in the 140s the last several days. He increased his metoprolol to 100mg  twice a day last night. HR is currently 132. Pt will continue increased metoprolol and call tomorrow with HR readings. Pt in agreement.

## 2018-02-18 NOTE — Telephone Encounter (Signed)
Please advise. Thanks.  

## 2018-02-20 ENCOUNTER — Other Ambulatory Visit (HOSPITAL_COMMUNITY): Payer: Self-pay | Admitting: *Deleted

## 2018-02-20 NOTE — Telephone Encounter (Addendum)
Pt states HR right around 100-110 with metoprolol increase. Pt has decided to move cardioversion up from 3/6 to 2/27. Pt on Xarelto for 3 weeks after 2/25. Pt will have labs drawn by PCP on 2/21.  Will reschedule echo for after cardioversion when patient is more rate controlled.

## 2018-02-21 ENCOUNTER — Encounter: Payer: Self-pay | Admitting: *Deleted

## 2018-02-21 DIAGNOSIS — F329 Major depressive disorder, single episode, unspecified: Secondary | ICD-10-CM | POA: Insufficient documentation

## 2018-02-21 DIAGNOSIS — F32A Depression, unspecified: Secondary | ICD-10-CM | POA: Insufficient documentation

## 2018-02-21 NOTE — Telephone Encounter (Signed)
Pt has apt with Dr. Anitra Lauth tomorrow (02/22/18) will address then.

## 2018-02-22 ENCOUNTER — Ambulatory Visit (HOSPITAL_COMMUNITY): Payer: Commercial Managed Care - PPO

## 2018-02-22 ENCOUNTER — Ambulatory Visit: Payer: Commercial Managed Care - PPO | Admitting: Family Medicine

## 2018-02-22 ENCOUNTER — Ambulatory Visit (HOSPITAL_COMMUNITY): Payer: Commercial Managed Care - PPO | Admitting: Physician Assistant

## 2018-02-25 ENCOUNTER — Other Ambulatory Visit (HOSPITAL_COMMUNITY): Payer: Self-pay | Admitting: *Deleted

## 2018-02-25 ENCOUNTER — Ambulatory Visit (HOSPITAL_COMMUNITY)
Admission: RE | Admit: 2018-02-25 | Discharge: 2018-02-25 | Disposition: A | Discharge status: Home / Self Care | Payer: Commercial Managed Care - PPO | Source: Ambulatory Visit | Attending: Physician Assistant | Admitting: Physician Assistant

## 2018-02-25 VITALS — BP 146/90 | HR 89

## 2018-02-25 DIAGNOSIS — Z8042 Family history of malignant neoplasm of prostate: Secondary | ICD-10-CM | POA: Diagnosis not present

## 2018-02-25 DIAGNOSIS — I4892 Unspecified atrial flutter: Secondary | ICD-10-CM | POA: Insufficient documentation

## 2018-02-25 DIAGNOSIS — Z7901 Long term (current) use of anticoagulants: Secondary | ICD-10-CM | POA: Insufficient documentation

## 2018-02-25 DIAGNOSIS — E669 Obesity, unspecified: Secondary | ICD-10-CM | POA: Insufficient documentation

## 2018-02-25 DIAGNOSIS — E119 Type 2 diabetes mellitus without complications: Secondary | ICD-10-CM | POA: Insufficient documentation

## 2018-02-25 DIAGNOSIS — F419 Anxiety disorder, unspecified: Secondary | ICD-10-CM | POA: Insufficient documentation

## 2018-02-25 DIAGNOSIS — I10 Essential (primary) hypertension: Secondary | ICD-10-CM | POA: Insufficient documentation

## 2018-02-25 DIAGNOSIS — Z8249 Family history of ischemic heart disease and other diseases of the circulatory system: Secondary | ICD-10-CM | POA: Insufficient documentation

## 2018-02-25 DIAGNOSIS — G4733 Obstructive sleep apnea (adult) (pediatric): Secondary | ICD-10-CM | POA: Diagnosis not present

## 2018-02-25 DIAGNOSIS — Z79899 Other long term (current) drug therapy: Secondary | ICD-10-CM | POA: Insufficient documentation

## 2018-02-25 DIAGNOSIS — R0609 Other forms of dyspnea: Secondary | ICD-10-CM | POA: Insufficient documentation

## 2018-02-25 DIAGNOSIS — F329 Major depressive disorder, single episode, unspecified: Secondary | ICD-10-CM | POA: Insufficient documentation

## 2018-02-25 DIAGNOSIS — I480 Paroxysmal atrial fibrillation: Secondary | ICD-10-CM | POA: Diagnosis not present

## 2018-02-25 DIAGNOSIS — Z794 Long term (current) use of insulin: Secondary | ICD-10-CM | POA: Diagnosis not present

## 2018-02-25 MED ORDER — METOPROLOL TARTRATE 50 MG PO TABS: 50.0000 mg | ORAL_TABLET | Freq: Two times a day (BID) | ORAL | 0 refills | Status: DC

## 2018-02-25 MED ORDER — METOPROLOL TARTRATE 100 MG PO TABS: 100.0000 mg | ORAL_TABLET | Freq: Two times a day (BID) | ORAL | 3 refills | Status: DC

## 2018-02-25 NOTE — Patient Instructions (Signed)
Continue metoprolol 100mg  twice a day

## 2018-02-25 NOTE — Progress Notes (Signed)
Primary Care Physician: Tammi Sou, MD  Referring Physician: [facility]   Ethan Pitts is a 58 y.o. male with a history of atrial flutter, OSA, HTN, DM who presents for consultation in the [facility].  The patient was initially diagnosed with atrial flutter 02/03/18 when his Apple Watch showed that he had an irregular rhythm. Patient works in ambulance transport and did a rhythm strip which read as atrial fibrillation. He had no awareness of his arrhythmia.  Patient called office today to report that his watch said he was back in [place]. Confirmed SR on ECG. Symptomatically, he does not feel any different in normal rhythm. He continues to have significant dyspnea on exertion. He was recently treated for bronchitis but his dyspnea has persisted. No chest pain. He denies significant weight changes or edema. Of note, his father passed away one week ago.  Today, he denies symptoms of palpitations, chest pain, orthopnea, PND, lower extremity edema, dizziness, presyncope, syncope, snoring, daytime somnolence, bleeding, or neurologic sequela. The patient is tolerating medications without difficulties and is otherwise without complaint today.    Atrial Fibrillation Risk Factors:  he does have symptoms or diagnosis of sleep apnea. he is compliant with CPAP therapy.  he does not have a history of rheumatic fever.  he does not have a history of significant alcohol use.  he has a BMI of There is no height or weight on file to calculate BMI.. There were no vitals filed for this visit.   Atrial Fibrillation Management history:  Previous antiarrhythmic drugs: none Previous cardioversions: none Previous ablations: none CHADS2VASC score: 2 (DM, HTN) Anticoagulation history: Xarelto   Past Medical History:  Diagnosis Date  . Anxiety   . Arthritis    lower back   . Depression   . Diabetes mellitus   . GERD (gastroesophageal reflux disease)   .  Hyperlipidemia   . Hypertension   . Hypogonadism, male 05/2015  . Increased prostate specific antigen (PSA) velocity 2019   06/2017 velocity up; recheck 10/2017 back to baseline.  Plan repeat 1 yr.  . Low back pain 2016   Left lumbar radiculopathy, lumbar spondylolisthesis.  Sciatica episode 03/2016.  Ortho getting L spine MRI as of 07/2016.  . Microcytic anemia 12/2015   suspect iron def due to malabsorption.  Hemoccults NEG 01/26/16, ferrous sulfate started.  . Neuropathic pain of left foot   . OSA on CPAP    cpap settings at 3   Past Surgical History:  Procedure Laterality Date  . ADENOIDECTOMY    . BREATH TEK H PYLORI N/A 01/08/2013   Procedure: BREATH TEK H PYLORI;  Surgeon: [name], MD;  Location: [facility];  Service: General;  Laterality: N/A;  . COLONOSCOPY  04/17/2014   Benign polyp x 1.  Recall 10 yrs.  Marland Kitchen GASTRIC ROUX-EN-Y N/A 04/14/2013   Procedure: LAPAROSCOPIC ROUX-EN-Y GASTRIC BYPASS WITH UPPER ENDOSCOPY;  Surgeon: [name], MD;  Location: [facility];  Service: General;  Laterality: N/A;  . TONSILLECTOMY      Current Outpatient Medications  Medication Sig Dispense Refill  . ALPRAZolam (XANAX) 0.5 MG tablet TAKE 1 TABLET BY MOUTH EVERY 8 HOURS AS NEEDED FOR SLEEP OR ANXIETY (Patient taking differently: Take 0.5 mg by mouth every 8 (eight) hours as needed (sleep/anxiety). ) 90 tablet 5  . amLODipine (NORVASC) 5 MG tablet TAKE 1 TABLET BY MOUTH ONCE DAILY (Patient taking differently: Take 5 mg by mouth daily. )  90 tablet 1  . benazepril (LOTENSIN) 40 MG tablet TAKE 1 TABLET BY MOUTH IN THE MORNING (Patient taking differently: Take 40 mg by mouth daily. ) 90 tablet 0  . benzonatate (TESSALON) 100 MG capsule Take 1 capsule (100 mg total) by mouth 2 (two) times daily as needed for cough. (Patient not taking: Reported on 02/25/2018) 20 capsule 0  . benzonatate (TESSALON) 200 MG capsule Take 1 capsule (200 mg total) by mouth 2 (two) times daily as needed for cough.  (Patient not taking: Reported on 02/25/2018) 20 capsule 0  . doxycycline (VIBRA-TABS) 100 MG tablet Take 1 tablet (100 mg total) by mouth 2 (two) times daily. (Patient not taking: Reported on 02/25/2018) 20 tablet 0  . furosemide (LASIX) 40 MG tablet Take 1 tablet (40 mg total) by mouth daily. 30 tablet 0  . GLIPIZIDE XL 5 MG 24 hr tablet TAKE 1 TABLET BY MOUTH ONCE DAILY WITH BREAKFAST (Patient taking differently: Take 5 mg by mouth daily with breakfast. ) 90 tablet 0  . glucose blood (BAYER CONTOUR NEXT TEST) test strip 1 each by Other route daily. And lancets qd 100 each 12  . ibuprofen (ADVIL,MOTRIN) 200 MG tablet Take 400-800 mg by mouth every 8 (eight) hours as needed (for pain.).    . Insulin Pen Needle 31G X 5 MM MISC Use with Victoza as directed 100 each 11  . metFORMIN (GLUCOPHAGE) 1000 MG tablet TAKE 1 TABLET BY MOUTH TWICE DAILY WITH A MEAL (Patient taking differently: Take 1,000 mg by mouth 2 (two) times daily. ) 180 tablet 1  . metoprolol tartrate (LOPRESSOR) 100 MG tablet Take 1 tablet (100 mg total) by mouth 2 (two) times daily. 60 tablet 3  . pregabalin (LYRICA) 150 MG capsule TAKE 1 CAPSULE BY MOUTH TWICE DAILY (Patient taking differently: Take 150 mg by mouth 2 (two) times daily. ) 60 capsule 5  . rivaroxaban (XARELTO) 20 MG TABS tablet Take 1 tablet (20 mg total) by mouth daily with supper. 30 tablet 0  . testosterone cypionate (DEPOTESTOSTERONE CYPIONATE) 200 MG/ML injection 1 ml IM q 14 days (Patient taking differently: Inject 200 mg into the muscle every 14 (fourteen) days. 1 ml IM q 14 days) 10 mL 0  . VICTOZA 18 MG/3ML SOPN INJECT 1.2MG  TOTAL INTO THE SKIN (Patient taking differently: Inject 1.2 mg into the skin daily. ) 6 mL 1  . zaleplon (SONATA) 5 MG capsule TAKE 1 CAPSULE BY MOUTH AT BEDTIME AS NEEDED FOR SLEEP (Patient taking differently: Take 5 mg by mouth at bedtime as needed for sleep. ) 90 capsule 1   No current facility-administered medications for this encounter.      No Known Allergies  Social History   Socioeconomic History  . Marital status: Single    Spouse name: Not on file  . Number of children: Not on file  . Years of education: Not on file  . Highest education level: Not on file  Occupational History  . Occupation: Paramedic-EMT    Employer: [employer]  . Financial resource strain: Not on file  . Food insecurity:    Worry: Not on file    Inability: Not on file  . Transportation needs:    Medical: Not on file    Non-medical: Not on file  Tobacco Use  . Smoking status: Never Smoker  . Smokeless tobacco: Former Systems developer    Types: Chew  Substance and Sexual Activity  . Alcohol use: Yes  Alcohol/week: 12.0 standard drinks    Types: 12 Cans of beer per week    Comment:  12pk/per week  . Drug use: No  . Sexual activity: Not on file  Lifestyle  . Physical activity:    Days per week: Not on file    Minutes per session: Not on file  . Stress: Not on file  Relationships  . Social connections:    Talks on phone: Not on file    Gets together: Not on file    Attends religious service: Not on file    Active member of club or organization: Not on file    Attends meetings of clubs or organizations: Not on file    Relationship status: Not on file  . Intimate partner violence:    Fear of current or ex partner: Not on file    Emotionally abused: Not on file    Physically abused: Not on file    Forced sexual activity: Not on file  Other Topics Concern  . Not on file  Social History Narrative   Married, no children.   Occupation: paramedic    Family History  Problem Relation Age of Onset  . Heart disease Father        valvular disease  . COPD Father   . Prostate cancer Father   . Arrhythmia Father   . Colon cancer Neg Hx    The patient does not have a history of early familial atrial fibrillation or other arrhythmias.  ROS- All systems are reviewed and negative except as per the HPI  above.  Physical Exam: Vitals:   02/25/18 1006  BP: (!) 146/90  Pulse: 89    GEN- The patient is well appearing obese male, alert and oriented x 3 today.   HEENT-head normocephalic, atraumatic, sclera clear, conjunctiva pink, hearing intact, trachea midline. Lungs- Clear to ausculation bilaterally, normal work of breathing Heart- Regular rate and rhythm, no murmurs, rubs or gallops  GI- soft, NT, ND, + BS Extremities- no clubbing, cyanosis, trace edema MS- no significant deformity or atrophy Skin- no rash or lesion Psych- euthymic mood, full affect Neuro- strength and sensation are intact   Wt Readings from Last 3 Encounters:  02/12/18 134.8 kg  02/05/18 134.3 kg  02/03/18 133.8 kg    EKG today demonstrates SR HR 89, PR 198, QRS 112, QTc 459  Epic records are reviewed at length today  Assessment and Plan:  1. Typical atrial flutter Patient has asymptomatic atrial flutter. He is back in SR today. Check echocardiogram Continue Xarelto 20 mg daily.  Continue metoprolol 100 mg BID. May consider atrial flutter ablation once testing complete.   This patients CHA2DS2-VASc Score and unadjusted Ischemic Stroke Rate (% per year) is equal to 2.2 % stroke rate/year from a score of 2  Above score calculated as 1 point each if present [CHF, HTN, DM, Vascular=MI/PAD/Aortic Plaque, Age if 65-74, or Male] Above score calculated as 2 points each if present [Age > 75, or Stroke/TIA/TE]  2. Dyspnea on exertion Patient continues to have significant dyspnea on exertion despite being in SR and being treated for bronchitis. No signs of fluid overload.  Will check echocardiogram Check stress test to evaluate for ischemia. Due to patient's limited functional capacity, will order chemical stress test.  3. Obesity Lifestyle modification was discussed at length including regular exercise and weight reduction.  4. Obstructive sleep apnea The importance of adequate treatment of sleep apnea  was discussed today in order to improve our  ability to maintain sinus rhythm long term. Compliant with CPAP therapy.  5. HTN Borderline today. Continue present therapy for now.  Follow up in [facility] Clinic in 3 weeks.  [facility] [address] [place], [place] [address] [phone] 02/25/2018 11:19 AM

## 2018-02-25 NOTE — Progress Notes (Signed)
Pt in for EKG, believed to be back in NSR per patient.  To be reviewed by Adline Peals, PA

## 2018-02-27 ENCOUNTER — Encounter: Payer: Self-pay | Admitting: Family Medicine

## 2018-02-27 ENCOUNTER — Ambulatory Visit (INDEPENDENT_AMBULATORY_CARE_PROVIDER_SITE_OTHER): Payer: Commercial Managed Care - PPO | Admitting: Family Medicine

## 2018-02-27 VITALS — BP 147/91 | HR 82 | Temp 98.0°F | Resp 16 | Ht 72.0 in | Wt 304.2 lb

## 2018-02-27 DIAGNOSIS — Z7901 Long term (current) use of anticoagulants: Secondary | ICD-10-CM

## 2018-02-27 DIAGNOSIS — E291 Testicular hypofunction: Secondary | ICD-10-CM

## 2018-02-27 DIAGNOSIS — I10 Essential (primary) hypertension: Secondary | ICD-10-CM | POA: Diagnosis not present

## 2018-02-27 DIAGNOSIS — E118 Type 2 diabetes mellitus with unspecified complications: Secondary | ICD-10-CM

## 2018-02-27 DIAGNOSIS — I483 Typical atrial flutter: Secondary | ICD-10-CM

## 2018-02-27 LAB — CBC WITH DIFFERENTIAL/PLATELET
Basophils Absolute: 0 10*3/uL (ref 0.0–0.1)
Basophils Relative: 0.5 % (ref 0.0–3.0)
Eosinophils Absolute: 0.1 10*3/uL (ref 0.0–0.7)
Eosinophils Relative: 2 % (ref 0.0–5.0)
HEMATOCRIT: 43.8 % (ref 39.0–52.0)
Hemoglobin: 14.8 g/dL (ref 13.0–17.0)
LYMPHS PCT: 31.8 % (ref 12.0–46.0)
Lymphs Abs: 1.7 10*3/uL (ref 0.7–4.0)
MCHC: 33.8 g/dL (ref 30.0–36.0)
MCV: 89.2 fl (ref 78.0–100.0)
Monocytes Absolute: 0.6 10*3/uL (ref 0.1–1.0)
Monocytes Relative: 10.9 % (ref 3.0–12.0)
Neutro Abs: 3 10*3/uL (ref 1.4–7.7)
Neutrophils Relative %: 54.8 % (ref 43.0–77.0)
Platelets: 211 10*3/uL (ref 150.0–400.0)
RBC: 4.91 Mil/uL (ref 4.22–5.81)
RDW: 13.9 % (ref 11.5–15.5)
WBC: 5.5 10*3/uL (ref 4.0–10.5)

## 2018-02-27 LAB — HEMOGLOBIN A1C: HEMOGLOBIN A1C: 7.3 % — AB (ref 4.6–6.5)

## 2018-02-27 LAB — BASIC METABOLIC PANEL
BUN: 12 mg/dL (ref 6–23)
CO2: 30 mEq/L (ref 19–32)
Calcium: 9.3 mg/dL (ref 8.4–10.5)
Chloride: 100 mEq/L (ref 96–112)
Creatinine, Ser: 0.9 mg/dL (ref 0.40–1.50)
GFR: 87.21 mL/min (ref >60.00)
Glucose, Bld: 123 mg/dL — ABNORMAL HIGH (ref 70–99)
Potassium: 3.7 mEq/L (ref 3.5–5.1)
Sodium: 139 mEq/L (ref 135–145)

## 2018-02-27 MED ORDER — AMLODIPINE BESYLATE 10 MG PO TABS: 10.0000 mg | ORAL_TABLET | Freq: Every day | ORAL | 3 refills | Status: DC

## 2018-02-27 NOTE — H&P (View-Only) (Signed)
OFFICE VISIT  02/27/2018   CC:  Chief Complaint  Patient presents with  . Follow-up    [facility]CI, pt is fasting   HPI:    Patient is a 58 y.o. Caucasian male who presents for f/u chronic medical issues. Was dx'd 02/04/2018 in the E.D with rapid atrial flutter.  Was started on xarelto, lasix, and his coreg was changed over to lopressor. He was set for DCCV of his symptomatic a-flutter tomorrow--he has been on anticoag for 3 weeks now.  An echo was ordered but this has not been done yet.  He was found to be back in NSR 6 d/a so his procedure was cancelled. He then went back into irreg rhythm, rate up to 130-->then says it is regular and HR 82 this morning.  Feeling no palpitations or CP or SOB at this time.  He is scheduled for stress test 03/13/2018.  He has been seen in f/u by the A-fib clinic, metoprolol was increased to 100 bid at that time.  DM: only occ home gluc checks: 140 or so fasting,   HTN: no home bp monitoring lately.  He has male hypogonadism and takes testosterone injections (200 mg q 3wks).  ROS: some soreness in chest in general since coughing and sneezing a lot. No fevers.  No wheezing.  Cough from bronchitis resolved 2d ago.  Past Medical History:  Diagnosis Date  . Anxiety   . Arthritis    lower back   . Depression   . Diabetes mellitus   . GERD (gastroesophageal reflux disease)   . Hyperlipidemia   . Hypertension   . Hypogonadism, male 05/2015  . Increased prostate specific antigen (PSA) velocity 2019   06/2017 velocity up; recheck 10/2017 back to baseline.  Plan repeat 1 yr.  . Low back pain 2016   Left lumbar radiculopathy, lumbar spondylolisthesis.  Sciatica episode 03/2016.  Ortho getting L spine MRI as of 07/2016.  . Microcytic anemia 12/2015   suspect iron def due to malabsorption.  Hemoccults NEG 01/26/16, ferrous sulfate started.  . Neuropathic pain of left foot   . OSA on CPAP    cpap settings at 3    Past Surgical History:  Procedure Laterality Date   . ADENOIDECTOMY    . BREATH TEK H PYLORI N/A 01/08/2013   Procedure: BREATH TEK H PYLORI;  Surgeon: [name], MD;  Location: [facility];  Service: General;  Laterality: N/A;  . COLONOSCOPY  04/17/2014   Benign polyp x 1.  Recall 10 yrs.  Marland Kitchen GASTRIC ROUX-EN-Y N/A 04/14/2013   Procedure: LAPAROSCOPIC ROUX-EN-Y GASTRIC BYPASS WITH UPPER ENDOSCOPY;  Surgeon: [name], MD;  Location: [facility];  Service: General;  Laterality: N/A;  . TONSILLECTOMY      Outpatient Medications Prior to Visit  Medication Sig Dispense Refill  . amLODipine (NORVASC) 5 MG tablet TAKE 1 TABLET BY MOUTH ONCE DAILY (Patient taking differently: Take 5 mg by mouth daily. ) 90 tablet 1  . benazepril (LOTENSIN) 40 MG tablet TAKE 1 TABLET BY MOUTH IN THE MORNING (Patient taking differently: Take 40 mg by mouth daily. ) 90 tablet 0  . furosemide (LASIX) 40 MG tablet Take 1 tablet (40 mg total) by mouth daily. 30 tablet 0  . GLIPIZIDE XL 5 MG 24 hr tablet TAKE 1 TABLET BY MOUTH ONCE DAILY WITH BREAKFAST (Patient taking differently: Take 5 mg by mouth daily with breakfast. ) 90 tablet 0  . glucose blood (BAYER CONTOUR NEXT TEST) test strip 1  each by Other route daily. And lancets qd 100 each 12  . Insulin Pen Needle 31G X 5 MM MISC Use with Victoza as directed 100 each 11  . metFORMIN (GLUCOPHAGE) 1000 MG tablet TAKE 1 TABLET BY MOUTH TWICE DAILY WITH A MEAL (Patient taking differently: Take 1,000 mg by mouth 2 (two) times daily. ) 180 tablet 1  . metoprolol tartrate (LOPRESSOR) 100 MG tablet Take 1 tablet (100 mg total) by mouth 2 (two) times daily. 60 tablet 3  . pregabalin (LYRICA) 150 MG capsule TAKE 1 CAPSULE BY MOUTH TWICE DAILY (Patient taking differently: Take 150 mg by mouth 2 (two) times daily. ) 60 capsule 5  . rivaroxaban (XARELTO) 20 MG TABS tablet Take 1 tablet (20 mg total) by mouth daily with supper. 30 tablet 0  . testosterone cypionate (DEPOTESTOSTERONE CYPIONATE) 200 MG/ML injection 1 ml IM q 14 days  (Patient taking differently: Inject 200 mg into the muscle every 14 (fourteen) days. 1 ml IM q 14 days) 10 mL 0  . VICTOZA 18 MG/3ML SOPN INJECT 1.2MG  TOTAL INTO THE SKIN (Patient taking differently: Inject 1.2 mg into the skin daily. ) 6 mL 1  . zaleplon (SONATA) 5 MG capsule TAKE 1 CAPSULE BY MOUTH AT BEDTIME AS NEEDED FOR SLEEP (Patient taking differently: Take 5 mg by mouth at bedtime as needed for sleep. ) 90 capsule 1  . ALPRAZolam (XANAX) 0.5 MG tablet TAKE 1 TABLET BY MOUTH EVERY 8 HOURS AS NEEDED FOR SLEEP OR ANXIETY (Patient not taking: Reported on 02/27/2018) 90 tablet 5  . benzonatate (TESSALON) 200 MG capsule Take 1 capsule (200 mg total) by mouth 2 (two) times daily as needed for cough. (Patient not taking: Reported on 02/25/2018) 20 capsule 0  . doxycycline (VIBRA-TABS) 100 MG tablet Take 1 tablet (100 mg total) by mouth 2 (two) times daily. (Patient not taking: Reported on 02/25/2018) 20 tablet 0  . ibuprofen (ADVIL,MOTRIN) 200 MG tablet Take 400-800 mg by mouth every 8 (eight) hours as needed (for pain.).    Marland Kitchen benzonatate (TESSALON) 100 MG capsule Take 1 capsule (100 mg total) by mouth 2 (two) times daily as needed for cough. (Patient not taking: Reported on 02/25/2018) 20 capsule 0   No facility-administered medications prior to visit.     No Known Allergies  ROS As per HPI  PE: Blood pressure (!) 147/91, pulse 82, temperature 98 F (36.7 C), temperature source Oral, resp. rate 16, height 6' (1.829 m), weight (!) 304 lb 3.2 oz (138 kg), SpO2 93 %. Body mass index is 41.26 kg/m.  Gen: Alert, well appearing.  Patient is oriented to person, place, time, and situation. AFFECT: pleasant, lucid thought and speech. CV: RRR, occ ectopic beat, no m/r/g.   LUNGS: CTA bilat, nonlabored resps, good aeration in all lung fields. EXT: no clubbing or cyanosis.  2-3+ pitting edema bilat LL's.    LABS:  Lab Results  Component Value Date   TSH 0.581 02/03/2018   Lab Results  Component  Value Date   WBC 2.8 (L) 02/03/2018   HGB 13.3 02/03/2018   HCT 40.4 02/03/2018   MCV 90.8 02/03/2018   PLT 122 (L) 02/03/2018   Lab Results  Component Value Date   CREATININE 1.03 02/03/2018   BUN 13 02/03/2018   NA 131 (L) 02/03/2018   K 3.6 02/03/2018   CL 100 02/03/2018   CO2 24 02/03/2018   Lab Results  Component Value Date   ALT 27 02/03/2018   AST  26 02/03/2018   ALKPHOS 51 02/03/2018   BILITOT 0.6 02/03/2018   Lab Results  Component Value Date   CHOL 142 10/09/2017   Lab Results  Component Value Date   HDL 35.80 (L) 10/09/2017   Lab Results  Component Value Date   LDLCALC 71 10/09/2017   Lab Results  Component Value Date   TRIG 177.0 (H) 10/09/2017   Lab Results  Component Value Date   CHOLHDL 4 10/09/2017   Lab Results  Component Value Date   PSA 1.82 10/09/2017   PSA 2.81 06/25/2017   PSA 1.75 06/23/2016   Lab Results  Component Value Date   HGBA1C 6.3 10/09/2017   Lab Results  Component Value Date   TESTOSTERONE 677 02/23/2017    IMPRESSION AND PLAN:  1) DM 2: minimal home glucose monitoring.  Maintained on metformin, glipizide, and victoza. HbA1c today.  2) HTN: no home monitoring.  bP a little up here today. Increase amlodipine to 10mg  qd today. Encouraged pt to restart home bp monitoring, goal <130/80, call or return if not at goal in 10-14d.  3) Hypogonadism:  CBC and testosterone level check today. Most recent testost was 3 wks ago.  4) A-flutter: pt has been in and out of this since dx. A-fib clinic in control--? Ablation may be the plan now vs cardioversion-->pt to get back with cardiologist. He'll continue xarelto 20mg  qd and lopressor 100 mg bid. Echo and stress test ordered by cardiologist ---> to be done very soon.  An After Visit Summary was printed and given to the patient.  FOLLOW UP: 3 mo [facility]  Signed:  Crissie Sickles, MD           02/27/2018

## 2018-02-27 NOTE — Progress Notes (Signed)
OFFICE VISIT  02/27/2018   CC:  Chief Complaint  Patient presents with  . Follow-up    RCI, pt is fasting   HPI:    Patient is a 58 y.o. Caucasian male who presents for f/u chronic medical issues. Was dx'd 02/04/2018 in the E.D with rapid atrial flutter.  Was started on xarelto, lasix, and his coreg was changed over to lopressor. He was set for DCCV of his symptomatic a-flutter tomorrow--he has been on anticoag for 3 weeks now.  An echo was ordered but this has not been done yet.  He was found to be back in NSR 6 d/a so his procedure was cancelled. He then went back into irreg rhythm, rate up to 130-->then says it is regular and HR 82 this morning.  Feeling no palpitations or CP or SOB at this time.  He is scheduled for stress test 03/13/2018.  He has been seen in f/u by the A-fib clinic, metoprolol was increased to 100 bid at that time.  DM: only occ home gluc checks: 140 or so fasting,   HTN: no home bp monitoring lately.  He has male hypogonadism and takes testosterone injections (200 mg q 3wks).  ROS: some soreness in chest in general since coughing and sneezing a lot. No fevers.  No wheezing.  Cough from bronchitis resolved 2d ago.  Past Medical History:  Diagnosis Date  . Anxiety   . Arthritis    lower back   . Depression   . Diabetes mellitus   . GERD (gastroesophageal reflux disease)   . Hyperlipidemia   . Hypertension   . Hypogonadism, male 05/2015  . Increased prostate specific antigen (PSA) velocity 2019   06/2017 velocity up; recheck 10/2017 back to baseline.  Plan repeat 1 yr.  . Low back pain 2016   Left lumbar radiculopathy, lumbar spondylolisthesis.  Sciatica episode 03/2016.  Ortho getting L spine MRI as of 07/2016.  . Microcytic anemia 12/2015   suspect iron def due to malabsorption.  Hemoccults NEG 01/26/16, ferrous sulfate started.  . Neuropathic pain of left foot   . OSA on CPAP    cpap settings at 3    Past Surgical History:  Procedure Laterality Date   . ADENOIDECTOMY    . BREATH TEK H PYLORI N/A 01/08/2013   Procedure: BREATH TEK H PYLORI;  Surgeon: [name], MD;  Location: [facility];  Service: General;  Laterality: N/A;  . COLONOSCOPY  04/17/2014   Benign polyp x 1.  Recall 10 yrs.  Marland Kitchen GASTRIC ROUX-EN-Y N/A 04/14/2013   Procedure: LAPAROSCOPIC ROUX-EN-Y GASTRIC BYPASS WITH UPPER ENDOSCOPY;  Surgeon: [name], MD;  Location: [facility];  Service: General;  Laterality: N/A;  . TONSILLECTOMY      Outpatient Medications Prior to Visit  Medication Sig Dispense Refill  . amLODipine (NORVASC) 5 MG tablet TAKE 1 TABLET BY MOUTH ONCE DAILY (Patient taking differently: Take 5 mg by mouth daily. ) 90 tablet 1  . benazepril (LOTENSIN) 40 MG tablet TAKE 1 TABLET BY MOUTH IN THE MORNING (Patient taking differently: Take 40 mg by mouth daily. ) 90 tablet 0  . furosemide (LASIX) 40 MG tablet Take 1 tablet (40 mg total) by mouth daily. 30 tablet 0  . GLIPIZIDE XL 5 MG 24 hr tablet TAKE 1 TABLET BY MOUTH ONCE DAILY WITH BREAKFAST (Patient taking differently: Take 5 mg by mouth daily with breakfast. ) 90 tablet 0  . glucose blood (BAYER CONTOUR NEXT TEST) test strip 1  each by Other route daily. And lancets qd 100 each 12  . Insulin Pen Needle 31G X 5 MM MISC Use with Victoza as directed 100 each 11  . metFORMIN (GLUCOPHAGE) 1000 MG tablet TAKE 1 TABLET BY MOUTH TWICE DAILY WITH A MEAL (Patient taking differently: Take 1,000 mg by mouth 2 (two) times daily. ) 180 tablet 1  . metoprolol tartrate (LOPRESSOR) 100 MG tablet Take 1 tablet (100 mg total) by mouth 2 (two) times daily. 60 tablet 3  . pregabalin (LYRICA) 150 MG capsule TAKE 1 CAPSULE BY MOUTH TWICE DAILY (Patient taking differently: Take 150 mg by mouth 2 (two) times daily. ) 60 capsule 5  . rivaroxaban (XARELTO) 20 MG TABS tablet Take 1 tablet (20 mg total) by mouth daily with supper. 30 tablet 0  . testosterone cypionate (DEPOTESTOSTERONE CYPIONATE) 200 MG/ML injection 1 ml IM q 14 days  (Patient taking differently: Inject 200 mg into the muscle every 14 (fourteen) days. 1 ml IM q 14 days) 10 mL 0  . VICTOZA 18 MG/3ML SOPN INJECT 1.2MG  TOTAL INTO THE SKIN (Patient taking differently: Inject 1.2 mg into the skin daily. ) 6 mL 1  . zaleplon (SONATA) 5 MG capsule TAKE 1 CAPSULE BY MOUTH AT BEDTIME AS NEEDED FOR SLEEP (Patient taking differently: Take 5 mg by mouth at bedtime as needed for sleep. ) 90 capsule 1  . ALPRAZolam (XANAX) 0.5 MG tablet TAKE 1 TABLET BY MOUTH EVERY 8 HOURS AS NEEDED FOR SLEEP OR ANXIETY (Patient not taking: Reported on 02/27/2018) 90 tablet 5  . benzonatate (TESSALON) 200 MG capsule Take 1 capsule (200 mg total) by mouth 2 (two) times daily as needed for cough. (Patient not taking: Reported on 02/25/2018) 20 capsule 0  . doxycycline (VIBRA-TABS) 100 MG tablet Take 1 tablet (100 mg total) by mouth 2 (two) times daily. (Patient not taking: Reported on 02/25/2018) 20 tablet 0  . ibuprofen (ADVIL,MOTRIN) 200 MG tablet Take 400-800 mg by mouth every 8 (eight) hours as needed (for pain.).    Marland Kitchen benzonatate (TESSALON) 100 MG capsule Take 1 capsule (100 mg total) by mouth 2 (two) times daily as needed for cough. (Patient not taking: Reported on 02/25/2018) 20 capsule 0   No facility-administered medications prior to visit.     No Known Allergies  ROS As per HPI  PE: Blood pressure (!) 147/91, pulse 82, temperature 98 F (36.7 C), temperature source Oral, resp. rate 16, height 6' (1.829 m), weight (!) 304 lb 3.2 oz (138 kg), SpO2 93 %. Body mass index is 41.26 kg/m.  Gen: Alert, well appearing.  Patient is oriented to person, place, time, and situation. AFFECT: pleasant, lucid thought and speech. CV: RRR, occ ectopic beat, no m/r/g.   LUNGS: CTA bilat, nonlabored resps, good aeration in all lung fields. EXT: no clubbing or cyanosis.  2-3+ pitting edema bilat LL's.    LABS:  Lab Results  Component Value Date   TSH 0.581 02/03/2018   Lab Results  Component  Value Date   WBC 2.8 (L) 02/03/2018   HGB 13.3 02/03/2018   HCT 40.4 02/03/2018   MCV 90.8 02/03/2018   PLT 122 (L) 02/03/2018   Lab Results  Component Value Date   CREATININE 1.03 02/03/2018   BUN 13 02/03/2018   NA 131 (L) 02/03/2018   K 3.6 02/03/2018   CL 100 02/03/2018   CO2 24 02/03/2018   Lab Results  Component Value Date   ALT 27 02/03/2018   AST  26 02/03/2018   ALKPHOS 51 02/03/2018   BILITOT 0.6 02/03/2018   Lab Results  Component Value Date   CHOL 142 10/09/2017   Lab Results  Component Value Date   HDL 35.80 (L) 10/09/2017   Lab Results  Component Value Date   LDLCALC 71 10/09/2017   Lab Results  Component Value Date   TRIG 177.0 (H) 10/09/2017   Lab Results  Component Value Date   CHOLHDL 4 10/09/2017   Lab Results  Component Value Date   PSA 1.82 10/09/2017   PSA 2.81 06/25/2017   PSA 1.75 06/23/2016   Lab Results  Component Value Date   HGBA1C 6.3 10/09/2017   Lab Results  Component Value Date   TESTOSTERONE 677 02/23/2017    IMPRESSION AND PLAN:  1) DM 2: minimal home glucose monitoring.  Maintained on metformin, glipizide, and victoza. HbA1c today.  2) HTN: no home monitoring.  bP a little up here today. Increase amlodipine to 10mg  qd today. Encouraged pt to restart home bp monitoring, goal <130/80, call or return if not at goal in 10-14d.  3) Hypogonadism:  CBC and testosterone level check today. Most recent testost was 3 wks ago.  4) A-flutter: pt has been in and out of this since dx. A-fib clinic in control--? Ablation may be the plan now vs cardioversion-->pt to get back with cardiologist. He'll continue xarelto 20mg  qd and lopressor 100 mg bid. Echo and stress test ordered by cardiologist ---> to be done very soon.  An After Visit Summary was printed and given to the patient.  FOLLOW UP: 3 mo RCI  Signed:  Crissie Sickles, MD           02/27/2018

## 2018-02-28 ENCOUNTER — Encounter: Payer: Self-pay | Admitting: *Deleted

## 2018-02-28 ENCOUNTER — Ambulatory Visit (HOSPITAL_COMMUNITY): Admit: 2018-02-28 | Payer: Commercial Managed Care - PPO | Admitting: Internal Medicine

## 2018-02-28 ENCOUNTER — Encounter (HOSPITAL_COMMUNITY): Payer: Self-pay

## 2018-02-28 ENCOUNTER — Other Ambulatory Visit (HOSPITAL_COMMUNITY): Payer: Commercial Managed Care - PPO | Admitting: Physician Assistant

## 2018-02-28 LAB — TESTOSTERONE: Testosterone: 218.25 ng/dL — ABNORMAL LOW (ref 300.00–890.00)

## 2018-02-28 SURGERY — CARDIOVERSION
Anesthesia: General

## 2018-03-01 ENCOUNTER — Other Ambulatory Visit (HOSPITAL_COMMUNITY): Payer: Self-pay | Admitting: *Deleted

## 2018-03-01 MED ORDER — RIVAROXABAN 20 MG PO TABS: 20.0000 mg | ORAL_TABLET | Freq: Every day | ORAL | 6 refills | Status: DC

## 2018-03-03 DIAGNOSIS — Z789 Other specified health status: Secondary | ICD-10-CM

## 2018-03-03 DIAGNOSIS — I2510 Atherosclerotic heart disease of native coronary artery without angina pectoris: Secondary | ICD-10-CM

## 2018-03-03 HISTORY — DX: Other specified health status: Z78.9

## 2018-03-03 HISTORY — PX: CARDIOVASCULAR STRESS TEST: SHX262

## 2018-03-03 HISTORY — PX: CARDIAC CATHETERIZATION: SHX172

## 2018-03-03 HISTORY — DX: Atherosclerotic heart disease of native coronary artery without angina pectoris: I25.10

## 2018-03-08 ENCOUNTER — Ambulatory Visit (HOSPITAL_COMMUNITY): Payer: Commercial Managed Care - PPO | Admitting: Physician Assistant

## 2018-03-08 ENCOUNTER — Ambulatory Visit (HOSPITAL_COMMUNITY)
Admission: RE | Admit: 2018-03-08 | Discharge: 2018-03-08 | Disposition: A | Discharge status: Home / Self Care | Payer: Commercial Managed Care - PPO | Source: Ambulatory Visit | Attending: Physician Assistant | Admitting: Physician Assistant

## 2018-03-08 DIAGNOSIS — E785 Hyperlipidemia, unspecified: Secondary | ICD-10-CM | POA: Diagnosis not present

## 2018-03-08 DIAGNOSIS — I4819 Other persistent atrial fibrillation: Secondary | ICD-10-CM | POA: Diagnosis not present

## 2018-03-08 DIAGNOSIS — I358 Other nonrheumatic aortic valve disorders: Secondary | ICD-10-CM | POA: Insufficient documentation

## 2018-03-08 DIAGNOSIS — E119 Type 2 diabetes mellitus without complications: Secondary | ICD-10-CM | POA: Diagnosis not present

## 2018-03-08 NOTE — Progress Notes (Signed)
  Echocardiogram 2D Echocardiogram has been performed.  Ethan Pitts 03/08/2018, 4:17 PM

## 2018-03-11 ENCOUNTER — Telehealth (HOSPITAL_COMMUNITY): Payer: Self-pay | Admitting: *Deleted

## 2018-03-11 NOTE — Telephone Encounter (Signed)
Patient given detailed instructions per Myocardial Perfusion Study Information Sheet for the test on 03/13/18. Patient notified to arrive 15 minutes early and that it is imperative to arrive on time for appointment to keep from having the test rescheduled.  If you need to cancel or reschedule your appointment, please call the office within 24 hours of your appointment. . Patient verbalized understanding. Kirstie Peri

## 2018-03-12 ENCOUNTER — Telehealth (HOSPITAL_COMMUNITY): Payer: Self-pay

## 2018-03-13 ENCOUNTER — Other Ambulatory Visit (HOSPITAL_COMMUNITY): Payer: Self-pay | Admitting: *Deleted

## 2018-03-13 ENCOUNTER — Other Ambulatory Visit: Payer: Self-pay

## 2018-03-13 ENCOUNTER — Ambulatory Visit (HOSPITAL_COMMUNITY): Payer: Commercial Managed Care - PPO | Attending: Physician Assistant

## 2018-03-13 DIAGNOSIS — I480 Paroxysmal atrial fibrillation: Secondary | ICD-10-CM

## 2018-03-13 DIAGNOSIS — I4819 Other persistent atrial fibrillation: Secondary | ICD-10-CM

## 2018-03-13 MED ORDER — TECHNETIUM TC 99M TETROFOSMIN IV KIT
32.6000 | PACK | Freq: Once | INTRAVENOUS | Status: AC | PRN
  Administered 2018-03-13: 32.6 via INTRAVENOUS
  Filled 2018-03-13: qty 33

## 2018-03-13 MED ORDER — REGADENOSON 0.4 MG/5ML IV SOLN
0.4000 mg | Freq: Once | INTRAVENOUS | Status: AC
  Administered 2018-03-13: 0.4 mg via INTRAVENOUS

## 2018-03-14 ENCOUNTER — Ambulatory Visit (HOSPITAL_COMMUNITY): Payer: Commercial Managed Care - PPO | Attending: Cardiology

## 2018-03-14 ENCOUNTER — Other Ambulatory Visit: Payer: Self-pay

## 2018-03-14 LAB — MYOCARDIAL PERFUSION IMAGING
CHL CUP NUCLEAR SDS: 5
CSEPPHR: 88 {beats}/min
LV dias vol: 173 mL (ref 62–150)
LV sys vol: 104 mL
Rest HR: 77 {beats}/min
SRS: 1
SSS: 6
TID: 0.98

## 2018-03-14 MED ORDER — TECHNETIUM TC 99M TETROFOSMIN IV KIT
32.0000 | PACK | Freq: Once | INTRAVENOUS | Status: AC | PRN
  Administered 2018-03-14: 32 via INTRAVENOUS
  Filled 2018-03-14: qty 32

## 2018-03-15 ENCOUNTER — Encounter (HOSPITAL_COMMUNITY): Payer: Self-pay | Admitting: *Deleted

## 2018-03-15 ENCOUNTER — Other Ambulatory Visit (HOSPITAL_COMMUNITY): Payer: Self-pay | Admitting: *Deleted

## 2018-03-15 DIAGNOSIS — R9439 Abnormal result of other cardiovascular function study: Secondary | ICD-10-CM

## 2018-03-21 ENCOUNTER — Ambulatory Visit (HOSPITAL_COMMUNITY): Payer: Commercial Managed Care - PPO | Admitting: Physician Assistant

## 2018-03-21 ENCOUNTER — Telehealth: Payer: Self-pay | Admitting: *Deleted

## 2018-03-21 DIAGNOSIS — R9439 Abnormal result of other cardiovascular function study: Secondary | ICD-10-CM

## 2018-03-21 NOTE — Telephone Encounter (Signed)
Pt contacted pre-catheterization scheduled at [facility] for: Friday March 22, 2018 7:30 AM Verified arrival time and place: [facility] at: 5:30 AM  No solid food after midnight prior to cath, clear liquids until 5 AM day of procedure.  Hold: Metformin-day of procedure and 48 hours post procedure. Glipizide-AM of procedure. Victoza-AM of procedure. Rivaroxaban-last dose 03/19/18 until post procedure. Fursemide-AM of procedure.  Except hold medications AM meds can be  taken pre-cath with sip of water including: ASA 81 mg  Confirmed patient has responsible person to drive home post procedure and observe 24 hours after arriving home: yes     Cardiac Questionnaire:    COVID-19 Pre-Screening Questions:  . Have you recently travelled abroad or to [place], [place] state, or [place]? no . Do you currently have a fever? no . Have you been in contact with someone that is currently pending confirmation of Covid19 testing or has been confirmed to have the [place] virus?  no . Are you currently experiencing fatigue or cough? no . Are you currently experiencing new or worsening shortness of breath at rest or with minimal activity? no . Have you been in contact with someone that was recently sick with fever/cough/fatigue? no  Please be advised that [facility] is restricting visitors at this time and request that only patients present for check-in prior to their appointment.  Only one visitor may come with you into the building. For everyone's safety, all patients and visitors entering our practice area should expect to be screened again prior to entering our waiting area.   I reviewed instructions for cath, prescreen questions, and visitor information with patient.

## 2018-03-21 NOTE — Progress Notes (Signed)
  Coronavirus Screening  Have you experienced the following symptoms:  Cough yes/no: No Fever (>100.4F)  yes/no: No Runny nose yes/no: No Sore throat yes/no: No Difficulty breathing/shortness of breath  yes/no: No  Have you or a family member traveled in the last 14 days and where? yes/no: No   Please remind your patients and families that hospital visitation restrictions are in effect and the importance of the restrictions.  

## 2018-03-22 ENCOUNTER — Encounter: Payer: Self-pay | Admitting: Family Medicine

## 2018-03-22 ENCOUNTER — Encounter (HOSPITAL_COMMUNITY)
Admission: RE | Disposition: A | Discharge status: Home / Self Care | Payer: Self-pay | Source: Home / Self Care | Attending: Interventional Cardiology

## 2018-03-22 ENCOUNTER — Other Ambulatory Visit: Payer: Self-pay

## 2018-03-22 ENCOUNTER — Ambulatory Visit (HOSPITAL_COMMUNITY): Payer: Commercial Managed Care - PPO

## 2018-03-22 ENCOUNTER — Ambulatory Visit (HOSPITAL_COMMUNITY)
Admission: RE | Admit: 2018-03-22 | Discharge: 2018-03-22 | Disposition: A | Discharge status: Home / Self Care | Payer: Commercial Managed Care - PPO | Attending: Interventional Cardiology | Admitting: Interventional Cardiology

## 2018-03-22 DIAGNOSIS — F329 Major depressive disorder, single episode, unspecified: Secondary | ICD-10-CM | POA: Insufficient documentation

## 2018-03-22 DIAGNOSIS — Z79899 Other long term (current) drug therapy: Secondary | ICD-10-CM | POA: Insufficient documentation

## 2018-03-22 DIAGNOSIS — I2510 Atherosclerotic heart disease of native coronary artery without angina pectoris: Secondary | ICD-10-CM

## 2018-03-22 DIAGNOSIS — G4733 Obstructive sleep apnea (adult) (pediatric): Secondary | ICD-10-CM | POA: Insufficient documentation

## 2018-03-22 DIAGNOSIS — I4891 Unspecified atrial fibrillation: Secondary | ICD-10-CM | POA: Insufficient documentation

## 2018-03-22 DIAGNOSIS — M1990 Unspecified osteoarthritis, unspecified site: Secondary | ICD-10-CM | POA: Diagnosis not present

## 2018-03-22 DIAGNOSIS — F419 Anxiety disorder, unspecified: Secondary | ICD-10-CM | POA: Diagnosis not present

## 2018-03-22 DIAGNOSIS — Z7901 Long term (current) use of anticoagulants: Secondary | ICD-10-CM | POA: Insufficient documentation

## 2018-03-22 DIAGNOSIS — Z9884 Bariatric surgery status: Secondary | ICD-10-CM | POA: Insufficient documentation

## 2018-03-22 DIAGNOSIS — R931 Abnormal findings on diagnostic imaging of heart and coronary circulation: Secondary | ICD-10-CM | POA: Diagnosis not present

## 2018-03-22 DIAGNOSIS — K219 Gastro-esophageal reflux disease without esophagitis: Secondary | ICD-10-CM | POA: Diagnosis not present

## 2018-03-22 DIAGNOSIS — E119 Type 2 diabetes mellitus without complications: Secondary | ICD-10-CM | POA: Diagnosis not present

## 2018-03-22 DIAGNOSIS — E785 Hyperlipidemia, unspecified: Secondary | ICD-10-CM | POA: Insufficient documentation

## 2018-03-22 DIAGNOSIS — Z791 Long term (current) use of non-steroidal anti-inflammatories (NSAID): Secondary | ICD-10-CM | POA: Diagnosis not present

## 2018-03-22 DIAGNOSIS — Z794 Long term (current) use of insulin: Secondary | ICD-10-CM | POA: Diagnosis not present

## 2018-03-22 DIAGNOSIS — E291 Testicular hypofunction: Secondary | ICD-10-CM | POA: Insufficient documentation

## 2018-03-22 DIAGNOSIS — I10 Essential (primary) hypertension: Secondary | ICD-10-CM | POA: Insufficient documentation

## 2018-03-22 DIAGNOSIS — R9439 Abnormal result of other cardiovascular function study: Secondary | ICD-10-CM

## 2018-03-22 DIAGNOSIS — I4892 Unspecified atrial flutter: Secondary | ICD-10-CM | POA: Insufficient documentation

## 2018-03-22 HISTORY — PX: LEFT HEART CATH AND CORONARY ANGIOGRAPHY: CATH118249

## 2018-03-22 LAB — POCT I-STAT 7, (LYTES, BLD GAS, ICA,H+H)
ACID-BASE DEFICIT: 5 mmol/L — AB (ref 0.0–2.0)
Bicarbonate: 20.9 mmol/L (ref 20.0–28.0)
Calcium, Ion: 1.21 mmol/L (ref 1.15–1.40)
HEMATOCRIT: 40 % (ref 39.0–52.0)
Hemoglobin: 13.6 g/dL (ref 13.0–17.0)
O2 Saturation: 94 %
Potassium: 3.7 mmol/L (ref 3.5–5.1)
Sodium: 139 mmol/L (ref 135–145)
TCO2: 22 mmol/L (ref 22–32)
pCO2 arterial: 41.8 mmHg (ref 32.0–48.0)
pH, Arterial: 7.306 — ABNORMAL LOW (ref 7.350–7.450)
pO2, Arterial: 79 mmHg — ABNORMAL LOW (ref 83.0–108.0)

## 2018-03-22 LAB — POCT I-STAT CREATININE: Creatinine, Ser: 0.9 mg/dL (ref 0.61–1.24)

## 2018-03-22 LAB — GLUCOSE, CAPILLARY: Glucose-Capillary: 118 mg/dL — ABNORMAL HIGH (ref 70–99)

## 2018-03-22 SURGERY — LEFT HEART CATH AND CORONARY ANGIOGRAPHY
Anesthesia: LOCAL

## 2018-03-22 MED ORDER — SODIUM CHLORIDE 0.9% FLUSH: 3.0000 mL | INTRAVENOUS | Status: DC | PRN

## 2018-03-22 MED ORDER — VERAPAMIL HCL 2.5 MG/ML IV SOLN
INTRAVENOUS | Status: AC
  Filled 2018-03-22: qty 2

## 2018-03-22 MED ORDER — SODIUM CHLORIDE 0.9 % IV SOLN: 250.0000 mL | INTRAVENOUS | Status: DC | PRN

## 2018-03-22 MED ORDER — LIDOCAINE HCL (PF) 1 % IJ SOLN
INTRAMUSCULAR | Status: AC
  Filled 2018-03-22: qty 30

## 2018-03-22 MED ORDER — SODIUM CHLORIDE 0.9% FLUSH: 3.0000 mL | Freq: Two times a day (BID) | INTRAVENOUS | Status: DC

## 2018-03-22 MED ORDER — FENTANYL CITRATE (PF) 100 MCG/2ML IJ SOLN
INTRAMUSCULAR | Status: DC | PRN
  Administered 2018-03-22 (×2): 25 ug via INTRAVENOUS

## 2018-03-22 MED ORDER — HEPARIN (PORCINE) IN NACL 1000-0.9 UT/500ML-% IV SOLN
INTRAVENOUS | Status: AC
  Filled 2018-03-22: qty 1000

## 2018-03-22 MED ORDER — LIDOCAINE HCL (PF) 1 % IJ SOLN
INTRAMUSCULAR | Status: DC | PRN
  Administered 2018-03-22: 6 mL

## 2018-03-22 MED ORDER — ACETAMINOPHEN 325 MG PO TABS: 650.0000 mg | ORAL_TABLET | ORAL | Status: DC | PRN

## 2018-03-22 MED ORDER — HEPARIN SODIUM (PORCINE) 1000 UNIT/ML IJ SOLN
INTRAMUSCULAR | Status: DC | PRN
  Administered 2018-03-22: 6000 [IU] via INTRAVENOUS

## 2018-03-22 MED ORDER — IOHEXOL 350 MG/ML SOLN
INTRAVENOUS | Status: DC | PRN
  Administered 2018-03-22: 90 mL via INTRAVENOUS

## 2018-03-22 MED ORDER — SODIUM CHLORIDE 0.9 % IV SOLN: INTRAVENOUS | Status: DC

## 2018-03-22 MED ORDER — SODIUM CHLORIDE 0.9 % WEIGHT BASED INFUSION
3.0000 mL/kg/h | INTRAVENOUS | Status: AC
  Administered 2018-03-22: 3 mL/kg/h via INTRAVENOUS

## 2018-03-22 MED ORDER — VERAPAMIL HCL 2.5 MG/ML IV SOLN
INTRAVENOUS | Status: DC | PRN
  Administered 2018-03-22: 10 mL via INTRA_ARTERIAL

## 2018-03-22 MED ORDER — MIDAZOLAM HCL 2 MG/2ML IJ SOLN
INTRAMUSCULAR | Status: AC
  Filled 2018-03-22: qty 2

## 2018-03-22 MED ORDER — MIDAZOLAM HCL 2 MG/2ML IJ SOLN
INTRAMUSCULAR | Status: DC | PRN
  Administered 2018-03-22: 0.5 mg via INTRAVENOUS
  Administered 2018-03-22: 1 mg via INTRAVENOUS

## 2018-03-22 MED ORDER — ASPIRIN 81 MG PO CHEW: 81.0000 mg | CHEWABLE_TABLET | ORAL | Status: DC

## 2018-03-22 MED ORDER — FENTANYL CITRATE (PF) 100 MCG/2ML IJ SOLN
INTRAMUSCULAR | Status: AC
  Filled 2018-03-22: qty 2

## 2018-03-22 MED ORDER — ONDANSETRON HCL 4 MG/2ML IJ SOLN: 4.0000 mg | Freq: Four times a day (QID) | INTRAMUSCULAR | Status: DC | PRN

## 2018-03-22 MED ORDER — HEPARIN SODIUM (PORCINE) 1000 UNIT/ML IJ SOLN
INTRAMUSCULAR | Status: AC
  Filled 2018-03-22: qty 1

## 2018-03-22 MED ORDER — SODIUM CHLORIDE 0.9 % WEIGHT BASED INFUSION: 1.0000 mL/kg/h | INTRAVENOUS | Status: DC

## 2018-03-22 MED ORDER — HEPARIN (PORCINE) IN NACL 1000-0.9 UT/500ML-% IV SOLN
INTRAVENOUS | Status: DC | PRN
  Administered 2018-03-22 (×2): 500 mL

## 2018-03-22 SURGICAL SUPPLY — 13 items
CATH INFINITI 5 FR JL3.5 (CATHETERS) ×2 IMPLANT
CATH INFINITI JR4 5F (CATHETERS) ×2 IMPLANT
CATH LAUNCHER 5F RADR (CATHETERS) ×1 IMPLANT
CATHETER LAUNCHER 5F RADR (CATHETERS) ×2
DEVICE RAD COMP TR BAND LRG (VASCULAR PRODUCTS) ×2 IMPLANT
GLIDESHEATH SLEND A-KIT 6F 22G (SHEATH) ×2 IMPLANT
GUIDEWIRE INQWIRE 1.5J.035X260 (WIRE) ×1 IMPLANT
INQWIRE 1.5J .035X260CM (WIRE) ×2
KIT HEART LEFT (KITS) ×2 IMPLANT
PACK CARDIAC CATHETERIZATION (CUSTOM PROCEDURE TRAY) ×2 IMPLANT
SHEATH PROBE COVER 6X72 (BAG) ×2 IMPLANT
TRANSDUCER W/STOPCOCK (MISCELLANEOUS) ×2 IMPLANT
TUBING CIL FLEX 10 FLL-RA (TUBING) ×2 IMPLANT

## 2018-03-22 NOTE — Interval H&P Note (Signed)
Cath Lab Visit (complete for each Cath Lab visit)  Clinical Evaluation Leading to the Procedure:   ACS: No.  Non-ACS:    Anginal Classification: CCS II  Anti-ischemic medical therapy: Minimal Therapy (1 class of medications)  Non-Invasive Test Results: Intermediate-risk stress test findings: cardiac mortality 1-3%/year  Prior CABG: No previous CABG      History and Physical Interval Note:  03/22/2018 7:44 AM  Ethan Pitts  has presented today for surgery, with the diagnosis of Abnormal stress test.  The various methods of treatment have been discussed with the patient and family. After consideration of risks, benefits and other options for treatment, the patient has consented to  Procedure(s): LEFT HEART CATH AND CORONARY ANGIOGRAPHY (N/A) as a surgical intervention.  The patient's history has been reviewed, patient examined, no change in status, stable for surgery.  I have reviewed the patient's chart and labs.  Questions were answered to the patient's satisfaction.     Belva Crome III

## 2018-03-22 NOTE — Discharge Instructions (Signed)
Radial Site Care ° °This sheet gives you information about how to care for yourself after your procedure. Your health care provider may also give you more specific instructions. If you have problems or questions, contact your health care provider. °What can I expect after the procedure? °After the procedure, it is common to have: °· Bruising and tenderness at the catheter insertion area. °Follow these instructions at home: °Medicines °· Take over-the-counter and prescription medicines only as told by your health care provider. °Insertion site care °· Follow instructions from your health care provider about how to take care of your insertion site. Make sure you: °? Wash your hands with soap and water before you change your bandage (dressing). If soap and water are not available, use hand sanitizer. °? Change your dressing as told by your health care provider. °? Leave stitches (sutures), skin glue, or adhesive strips in place. These skin closures may need to stay in place for 2 weeks or longer. If adhesive strip edges start to loosen and curl up, you may trim the loose edges. Do not remove adhesive strips completely unless your health care provider tells you to do that. °· Check your insertion site every day for signs of infection. Check for: °? Redness, swelling, or pain. °? Fluid or blood. °? Pus or a bad smell. °? Warmth. °· Do not take baths, swim, or use a hot tub until your health care provider approves. °· You may shower 24-48 hours after the procedure, or as directed by your health care provider. °? Remove the dressing and gently wash the site with plain soap and water. °? Pat the area dry with a clean towel. °? Do not rub the site. That could cause bleeding. °· Do not apply powder or lotion to the site. °Activity ° °· For 24 hours after the procedure, or as directed by your health care provider: °? Do not flex or bend the affected arm. °? Do not push or pull heavy objects with the affected arm. °? Do not  drive yourself home from the hospital or clinic. You may drive 24 hours after the procedure unless your health care provider tells you not to. °? Do not operate machinery or power tools. °· Do not lift anything that is heavier than 10 lb (4.5 kg), or the limit that you are told, until your health care provider says that it is safe. °· Ask your health care provider when it is okay to: °? Return to work or school. °? Resume usual physical activities or sports. °? Resume sexual activity. °General instructions °· If the catheter site starts to bleed, raise your arm and put firm pressure on the site. If the bleeding does not stop, get help right away. This is a medical emergency. °· If you went home on the same day as your procedure, a responsible adult should be with you for the first 24 hours after you arrive home. °· Keep all follow-up visits as told by your health care provider. This is important. °Contact a health care provider if: °· You have a fever. °· You have redness, swelling, or yellow drainage around your insertion site. °Get help right away if: °· You have unusual pain at the radial site. °· The catheter insertion area swells very fast. °· The insertion area is bleeding, and the bleeding does not stop when you hold steady pressure on the area. °· Your arm or hand becomes pale, cool, tingly, or numb. °These symptoms may represent a serious problem   that is an emergency. Do not wait to see if the symptoms will go away. Get medical help right away. Call your local emergency services (911 in the [place] Do not drive yourself to the hospital. °Summary °· After the procedure, it is common to have bruising and tenderness at the site. °· Follow instructions from your health care provider about how to take care of your radial site wound. Check the wound every day for signs of infection. °· Do not lift anything that is heavier than 10 lb (4.5 kg), or the limit that you are told, until your health care provider says  that it is safe. °This information is not intended to replace advice given to you by your health care provider. Make sure you discuss any questions you have with your health care provider. °Document Released: 01/21/2010 Document Revised: 01/24/2017 Document Reviewed: 01/24/2017 °Elsevier Interactive Patient Education © 2019 [employer] ° °

## 2018-03-22 NOTE — CV Procedure (Signed)
   Diagnostic coronary angiography via right radial approach using ultrasound guidance for access.  Widely patent coronary arteries.  False positive myocardial perfusion study due to obesity.

## 2018-03-25 ENCOUNTER — Encounter (HOSPITAL_COMMUNITY): Payer: Self-pay | Admitting: Interventional Cardiology

## 2018-03-25 ENCOUNTER — Encounter: Payer: Self-pay | Admitting: Family Medicine

## 2018-03-25 ENCOUNTER — Telehealth: Payer: Self-pay

## 2018-03-25 ENCOUNTER — Encounter (HOSPITAL_COMMUNITY): Payer: Self-pay | Admitting: *Deleted

## 2018-03-25 NOTE — Telephone Encounter (Signed)
Patient was contacted about refill request we received via MyChart from him. He is wanting [place] to refill Metoprolol for him but another provider prescribed this. He was advised to reach out to their office.  Please advise, thanks.

## 2018-03-25 NOTE — Telephone Encounter (Signed)
Spoke with patient and advised him of Dr.McGowen's recommendation regarding refill request. Patient repeatedly stated that since Dr.McGowen is his PCP, he should be able to refill this med for him because original Rx came from ED MD. He was told he needed to follow up with Cardiology. He said if Dr.McGowen can't refill med as his PCP, he would find another doctor.

## 2018-03-25 NOTE — Telephone Encounter (Signed)
That is correct-->cardiology is managing this medication for him.-thx

## 2018-03-26 ENCOUNTER — Other Ambulatory Visit: Payer: Self-pay

## 2018-03-26 ENCOUNTER — Encounter (HOSPITAL_COMMUNITY): Payer: Self-pay | Admitting: *Deleted

## 2018-03-26 ENCOUNTER — Ambulatory Visit (HOSPITAL_COMMUNITY)
Admission: RE | Admit: 2018-03-26 | Discharge: 2018-03-26 | Disposition: A | Discharge status: Home / Self Care | Payer: Commercial Managed Care - PPO | Source: Ambulatory Visit | Attending: Physician Assistant | Admitting: Physician Assistant

## 2018-03-26 ENCOUNTER — Other Ambulatory Visit (HOSPITAL_COMMUNITY): Payer: Self-pay | Admitting: *Deleted

## 2018-03-26 DIAGNOSIS — G4730 Sleep apnea, unspecified: Secondary | ICD-10-CM | POA: Diagnosis not present

## 2018-03-26 DIAGNOSIS — I10 Essential (primary) hypertension: Secondary | ICD-10-CM

## 2018-03-26 DIAGNOSIS — I483 Typical atrial flutter: Secondary | ICD-10-CM | POA: Diagnosis not present

## 2018-03-26 DIAGNOSIS — E668 Other obesity: Secondary | ICD-10-CM

## 2018-03-26 MED ORDER — METOPROLOL TARTRATE 100 MG PO TABS: 100.0000 mg | ORAL_TABLET | Freq: Two times a day (BID) | ORAL | 2 refills | Status: DC

## 2018-03-26 MED ORDER — RIVAROXABAN 20 MG PO TABS: 20.0000 mg | ORAL_TABLET | Freq: Every day | ORAL | 2 refills | Status: DC

## 2018-03-26 NOTE — Progress Notes (Signed)
Noted  

## 2018-03-26 NOTE — Progress Notes (Signed)
Electrophysiology TeleHealth Note   Due to national recommendations of social distancing due to [place] 19, Audio/video telehealth visit is felt to be most appropriate for this patient at this time.  See MyChart message from today for patient consent regarding telehealth for the [facility].    Date:  03/26/2018   ID:  Ethan Pitts, DOB March 09, 1960, MRN [id number]  Location: home  Provider location: [address] [place], [place] [address] Evaluation Performed: Follow up  PCP:  Tammi Sou, MD  Primary Cardiologist: Dr Tamala Julian (new, performed LHC)   CC: Follow up for atrial fibrillation   History of Present Illness: Ethan Pitts is a 58 y.o. male who presents via audio/video conferencing for a telehealth visit today.  Patient reports that he has done well since his last visit. His stress test did show an area of reversible ischemia but he underwent LHC which showed widely patent coronary arteries, EF 50%. Patient states that his SOB has improved since his testing. He has an Visual merchandiser which has showed NSR with heart rates in the 70s.   Today, he denies symptoms of palpitations, chest pain, shortness of breath, orthopnea, PND, lower extremity edema, claudication, dizziness, presyncope, syncope, bleeding, or neurologic sequela. The patient is tolerating medications without difficulties and is otherwise without complaint today.   he denies symptoms of cough, fevers, chills, or new SOB worrisome for COVID 19.     Atrial Fibrillation Risk Factors:  he does have symptoms or diagnosis of sleep apnea. he is compliant with CPAP therapy. he does not have a history of rheumatic fever. he does not have a history of alcohol use. The patient does not have a history of early familial atrial fibrillation or other arrhythmias.  he has a BMI of There is no height or weight on file to calculate BMI.. There were no vitals filed for this visit.  Past Medical  History:  Diagnosis Date   Anxiety    Arthritis    lower back    Depression    Diabetes mellitus    GERD (gastroesophageal reflux disease)    Hyperlipidemia    Hypertension    Hypogonadism, male 05/2015   Increased prostate specific antigen (PSA) velocity 2019   06/2017 velocity up; recheck 10/2017 back to baseline.  Plan repeat 1 yr.   Low back pain 2016   Left lumbar radiculopathy, lumbar spondylolisthesis.  Sciatica episode 03/2016.  Ortho getting L spine MRI as of 07/2016.   Microcytic anemia 12/2015   suspect iron def due to malabsorption.  Hemoccults NEG 01/26/16, ferrous sulfate started.   Neuropathic pain of left foot    OSA on CPAP    cpap settings at 3   Past Surgical History:  Procedure Laterality Date   ADENOIDECTOMY     BREATH TEK H PYLORI N/A 01/08/2013   Procedure: BREATH TEK Kandis Ban;  Surgeon: [name], MD;  Location: [facility];  Service: General;  Laterality: N/A;   CARDIAC CATHETERIZATION  03/2018   No signi obstructive CAD (suspected FALSE POSITIVE STRESS TEST).  EF 50%.  Diastolic dysfunction.   CARDIOVASCULAR STRESS TEST  03/2018   Intermediate risk; EF 40%, medium sized defect with peri-infarct ischemia-->follow up cath showed no signif obstructive CAD.   COLONOSCOPY  04/17/2014   Benign polyp x 1.  Recall 10 yrs.   GASTRIC ROUX-EN-Y N/A 04/14/2013   Procedure: LAPAROSCOPIC ROUX-EN-Y GASTRIC BYPASS WITH UPPER ENDOSCOPY;  Surgeon: [name], MD;  Location:  [facility];  Service: General;  Laterality: N/A;   LEFT HEART CATH AND CORONARY ANGIOGRAPHY N/A 03/22/2018   Procedure: LEFT HEART CATH AND CORONARY ANGIOGRAPHY;  Surgeon: Belva Crome, MD;  Location: [facility];  Service: Cardiovascular;  Laterality: N/A;   TONSILLECTOMY       Current Outpatient Medications  Medication Sig Dispense Refill   ALPRAZolam (XANAX) 0.5 MG tablet TAKE 1 TABLET BY MOUTH EVERY 8 HOURS AS NEEDED FOR SLEEP OR ANXIETY (Patient not taking:  Reported on 02/27/2018) 90 tablet 5   amLODipine (NORVASC) 10 MG tablet Take 1 tablet (10 mg total) by mouth daily. 30 tablet 3   benazepril (LOTENSIN) 40 MG tablet TAKE 1 TABLET BY MOUTH IN THE MORNING (Patient taking differently: Take 40 mg by mouth daily. ) 90 tablet 0   benzonatate (TESSALON) 200 MG capsule Take 1 capsule (200 mg total) by mouth 2 (two) times daily as needed for cough. (Patient not taking: Reported on 02/25/2018) 20 capsule 0   doxycycline (VIBRA-TABS) 100 MG tablet Take 1 tablet (100 mg total) by mouth 2 (two) times daily. (Patient not taking: Reported on 02/25/2018) 20 tablet 0   furosemide (LASIX) 40 MG tablet Take 1 tablet (40 mg total) by mouth daily. 30 tablet 0   GLIPIZIDE XL 5 MG 24 hr tablet TAKE 1 TABLET BY MOUTH ONCE DAILY WITH BREAKFAST (Patient taking differently: Take 5 mg by mouth daily with breakfast. ) 90 tablet 0   glucose blood (BAYER CONTOUR NEXT TEST) test strip 1 each by Other route daily. And lancets qd 100 each 12   ibuprofen (ADVIL,MOTRIN) 200 MG tablet Take 400-800 mg by mouth every 8 (eight) hours as needed (for pain.).     Insulin Pen Needle 31G X 5 MM MISC Use with Victoza as directed 100 each 11   metFORMIN (GLUCOPHAGE) 1000 MG tablet TAKE 1 TABLET BY MOUTH TWICE DAILY WITH A MEAL (Patient taking differently: Take 1,000 mg by mouth 2 (two) times daily. ) 180 tablet 1   metoprolol tartrate (LOPRESSOR) 100 MG tablet Take 1 tablet (100 mg total) by mouth 2 (two) times daily. 60 tablet 3   pregabalin (LYRICA) 150 MG capsule TAKE 1 CAPSULE BY MOUTH TWICE DAILY (Patient taking differently: Take 150 mg by mouth 2 (two) times daily. ) 60 capsule 5   rivaroxaban (XARELTO) 20 MG TABS tablet Take 1 tablet (20 mg total) by mouth daily with supper. 30 tablet 6   testosterone cypionate (DEPOTESTOSTERONE CYPIONATE) 200 MG/ML injection 1 ml IM q 14 days (Patient taking differently: Inject 200 mg into the muscle every 14 (fourteen) days. 1 ml IM q 14 days)  10 mL 0   VICTOZA 18 MG/3ML SOPN INJECT 1.2MG  TOTAL INTO THE SKIN (Patient taking differently: Inject 1.2 mg into the skin daily. ) 6 mL 1   zaleplon (SONATA) 5 MG capsule TAKE 1 CAPSULE BY MOUTH AT BEDTIME AS NEEDED FOR SLEEP (Patient taking differently: Take 5 mg by mouth at bedtime as needed for sleep. ) 90 capsule 1   No current facility-administered medications for this encounter.     Allergies:   Patient has no known allergies.   Social History:  The patient  reports that he has never smoked. He quit smokeless tobacco use about 8 years ago.  His smokeless tobacco use included chew. He reports current alcohol use of about 12.0 standard drinks of alcohol per week. He reports that he does not use drugs.   Family History:  The patient's  family history includes Arrhythmia in his father; COPD in his father; Heart disease in his father; Prostate cancer in his father.    ROS:  Please see the history of present illness.   All other systems are personally reviewed and negative.   Exam: Well appearing, alert and conversant, regular work of breathing,  good skin color  Recent Labs: 02/03/2018: ALT 27; B Natriuretic Peptide 334.5; Magnesium 1.6; TSH 0.581 02/27/2018: BUN 12; Platelets 211.0 03/22/2018: Creatinine, Ser 0.90; Hemoglobin 13.6; Potassium 3.7; Sodium 139  personally reviewed    Other studies personally reviewed: Additional studies/ records that were reviewed today include: Epic notes and cath report.   Elevated left ventricular end-diastolic pressure, 21 mmHg.  EF 50%.  Findings consistent with diastolic dysfunction.  Widely patent coronary arteries.  Normal left main  Normal LAD.  Ostial first diagonal 60% narrowed.  Normal circumflex  Normal right coronary  False positive myocardial perfusion study related to diaphragm attenuation.  The patient has wearable device technology. Encouraged patient to upload strips to [facility] prior to next visit so they will be available  for review.   ASSESSMENT AND PLAN:  1.  Typical atrial flutter Patient is asymptomatic and appears to be maintaining SR. Continue Xarelto 20 mg daily Continue Lopressor 100 mg BID May consider flutter ablation in the future once COVID-19 precautions end.  This patients CHA2DS2-VASc Score and unadjusted Ischemic Stroke Rate (% per year) is equal to 2.2 % stroke rate/year from a score of 2  Above score calculated as 1 point each if present [CHF, HTN, DM, Vascular=MI/PAD/Aortic Plaque, Age if 65-74, or Male] Above score calculated as 2 points each if present [Age > 75, or Stroke/TIA/TE]  2. False Positive stress test LHC showed widely patent coronary arteries. EF 50%.  3. Obesity Lifestyle modification again discussed today including regular physical activity and weight loss.  4. OSA Patient compliant with CPAP therapy.  5. HTN No changes today.   COVID screen The patient does not have any symptoms that suggest any further testing/ screening at this time.  Social distancing reinforced today.    Follow-up:  Afib clinic in 3 months.  Current medicines are reviewed at length with the patient today.   The patient does not have concerns regarding his medicines.  The following changes were made today:  none  Labs/ tests ordered today include:  No orders of the defined types were placed in this encounter.   Patient Risk:  after full review of this patients clinical status, I feel that they are at moderate risk at this time.   Today, I have spent 15 minutes with the patient with telehealth technology discussing echo and cath results and atrial flutter.    Gwenlyn Perking PA-C 03/26/2018 9:59 AM  [facility] [address] [place], [place] [address] [phone]

## 2018-03-26 NOTE — Telephone Encounter (Signed)
Pt did call cardiology and refill was granted by PA there.

## 2018-04-05 ENCOUNTER — Other Ambulatory Visit: Payer: Self-pay | Admitting: Family Medicine

## 2018-04-11 ENCOUNTER — Encounter (HOSPITAL_COMMUNITY): Payer: Self-pay

## 2018-04-18 ENCOUNTER — Ambulatory Visit (HOSPITAL_COMMUNITY): Payer: Commercial Managed Care - PPO

## 2018-04-20 ENCOUNTER — Other Ambulatory Visit: Payer: Self-pay | Admitting: Family Medicine

## 2018-04-22 ENCOUNTER — Encounter: Payer: Self-pay | Admitting: Family Medicine

## 2018-04-22 ENCOUNTER — Other Ambulatory Visit: Payer: Self-pay

## 2018-04-22 MED ORDER — FUROSEMIDE 40 MG PO TABS: 40.0000 mg | ORAL_TABLET | Freq: Every day | ORAL | 5 refills | Status: DC

## 2018-04-22 NOTE — Telephone Encounter (Signed)
RF request for Furesomide LOV: 02/27/18 Next ov:  06/05/18 Last written: 02/23/18 #30 w/ 0RF.  Please advise, thanks.  Medication pending

## 2018-04-25 ENCOUNTER — Other Ambulatory Visit: Payer: Self-pay | Admitting: Family Medicine

## 2018-04-26 ENCOUNTER — Telehealth: Payer: Self-pay | Admitting: Family Medicine

## 2018-04-26 ENCOUNTER — Other Ambulatory Visit: Payer: Self-pay | Admitting: Family Medicine

## 2018-04-26 NOTE — Telephone Encounter (Signed)
Patient needs a call in regards to his refill requests.

## 2018-04-26 NOTE — Telephone Encounter (Signed)
SW pt regarding refill request and he confirmed pharmacy has been taken care of it.

## 2018-05-03 ENCOUNTER — Other Ambulatory Visit: Payer: Self-pay | Admitting: Family Medicine

## 2018-05-03 DIAGNOSIS — I428 Other cardiomyopathies: Secondary | ICD-10-CM

## 2018-05-03 HISTORY — DX: Other cardiomyopathies: I42.8

## 2018-05-13 ENCOUNTER — Other Ambulatory Visit (HOSPITAL_COMMUNITY): Payer: Self-pay | Admitting: Physician Assistant

## 2018-05-13 ENCOUNTER — Ambulatory Visit (HOSPITAL_COMMUNITY)
Admission: RE | Admit: 2018-05-13 | Discharge: 2018-05-13 | Disposition: A | Discharge status: Home / Self Care | Payer: Commercial Managed Care - PPO | Source: Ambulatory Visit | Attending: Physician Assistant | Admitting: Physician Assistant

## 2018-05-13 ENCOUNTER — Other Ambulatory Visit: Payer: Self-pay

## 2018-05-13 DIAGNOSIS — E119 Type 2 diabetes mellitus without complications: Secondary | ICD-10-CM | POA: Diagnosis not present

## 2018-05-13 DIAGNOSIS — E785 Hyperlipidemia, unspecified: Secondary | ICD-10-CM | POA: Insufficient documentation

## 2018-05-13 DIAGNOSIS — I10 Essential (primary) hypertension: Secondary | ICD-10-CM | POA: Insufficient documentation

## 2018-05-13 DIAGNOSIS — I4819 Other persistent atrial fibrillation: Secondary | ICD-10-CM

## 2018-05-13 HISTORY — PX: TRANSTHORACIC ECHOCARDIOGRAM: SHX275

## 2018-05-13 MED ORDER — PERFLUTREN LIPID MICROSPHERE
1.0000 mL | INTRAVENOUS | Status: AC | PRN
  Administered 2018-05-13: 2 mL via INTRAVENOUS
  Filled 2018-05-13: qty 10

## 2018-05-13 NOTE — Progress Notes (Signed)
Echocardiogram 2D Echocardiogram has been performed.  Ethan Pitts 05/13/2018, 9:51 AM

## 2018-05-14 ENCOUNTER — Other Ambulatory Visit: Payer: Self-pay | Admitting: Family Medicine

## 2018-05-14 ENCOUNTER — Other Ambulatory Visit (HOSPITAL_COMMUNITY): Payer: Self-pay | Admitting: *Deleted

## 2018-05-14 DIAGNOSIS — I480 Paroxysmal atrial fibrillation: Secondary | ICD-10-CM

## 2018-05-22 ENCOUNTER — Other Ambulatory Visit: Payer: Self-pay

## 2018-05-22 MED ORDER — TESTOSTERONE CYPIONATE 200 MG/ML IM SOLN: INTRAMUSCULAR | 0 refills | Status: DC

## 2018-05-22 NOTE — Telephone Encounter (Signed)
RF request for Testosterone  LOV: 02/27/18 Next ov: 06/05/18 Last written: 01/16/18 (1mL,0)  Please advise, thanks. Medication pending

## 2018-05-23 ENCOUNTER — Encounter: Payer: Self-pay | Admitting: Family Medicine

## 2018-06-05 ENCOUNTER — Ambulatory Visit (INDEPENDENT_AMBULATORY_CARE_PROVIDER_SITE_OTHER): Payer: Commercial Managed Care - PPO | Admitting: Family Medicine

## 2018-06-05 ENCOUNTER — Other Ambulatory Visit: Payer: Self-pay

## 2018-06-05 ENCOUNTER — Encounter: Payer: Self-pay | Admitting: Family Medicine

## 2018-06-05 VITALS — BP 126/88 | HR 88

## 2018-06-05 DIAGNOSIS — I10 Essential (primary) hypertension: Secondary | ICD-10-CM

## 2018-06-05 DIAGNOSIS — R972 Elevated prostate specific antigen [PSA]: Secondary | ICD-10-CM

## 2018-06-05 DIAGNOSIS — E119 Type 2 diabetes mellitus without complications: Secondary | ICD-10-CM

## 2018-06-05 DIAGNOSIS — F411 Generalized anxiety disorder: Secondary | ICD-10-CM | POA: Diagnosis not present

## 2018-06-05 DIAGNOSIS — G4700 Insomnia, unspecified: Secondary | ICD-10-CM

## 2018-06-05 MED ORDER — HYDROCHLOROTHIAZIDE 12.5 MG PO CAPS: 12.5000 mg | ORAL_CAPSULE | Freq: Every day | ORAL | 3 refills | Status: DC

## 2018-06-05 MED ORDER — TESTOSTERONE CYPIONATE 200 MG/ML IM SOLN: INTRAMUSCULAR | 0 refills | Status: DC

## 2018-06-05 NOTE — Progress Notes (Signed)
Virtual Visit via Video Note  I connected with pt on 06/05/18 at  9:40 AM EDT by a video enabled telemedicine application and verified that I am speaking with the correct person using two identifiers.  Location patient: home Location provider:work or home office Persons participating in the virtual visit: patient, provider  I discussed the limitations of evaluation and management by telemedicine and the availability of in person appointments. The patient expressed understanding and agreed to proceed.  Telemedicine visit is a necessity given the COVID-19 restrictions in place at the current time.  HPI: 58 y/o WM being seen today for f/u DM 2, HTN, and chronic anxiety. He has male hypogonadism and takes 200 mg testost IM q 2 wks->last rx went to his old pharmacy so he asks if I can resend rx to his new pharmacy->[facility] in [place]   HTN: BPs 140s/80s. DM glucoses 100-160s. GAD: not taking anxiety med regularly, most recent xanax dose 1 mo ago. Taking zaleplon prn insomnia. PMP aware reviewed today. Most recent alpraz fill was 10/09/17, zaleplon 01/14/18, testost 01/22/18. He needs CSC--discussed this today.  GAD: Working as paramedic, night shift.  Stressful.  Only takes alprazolam prn--last dose was "about a month ago".  Taking lasix on days off only b/c can't be urinating all the time when taking paramedic calls.  ROS: See pertinent positives and negatives per HPI.  Past Medical History:  Diagnosis Date  . Anxiety   . Arthritis    lower back   . Atrial flutter ([place]) 2020   No cardioversion needed.  Cards->lopressor and anticoag  . Chronic systolic (congestive) heart failure ([place]) 05/2018   DOE->echo and stress test showed EF 40-45%, +LV hypokinesis.  . Depression   . Diabetes mellitus   . False positive stress test 03/2018   Cath showed normal coronaries with EF 50%  . GERD (gastroesophageal reflux disease)   . Hyperlipidemia   . Hypertension   . Hypogonadism, male 05/2015   . Increased prostate specific antigen (PSA) velocity 2019   06/2017 velocity up; recheck 10/2017 back to baseline.  Plan repeat 1 yr.  . Low back pain 2016   Left lumbar radiculopathy, lumbar spondylolisthesis.  Sciatica episode 03/2016.  Ortho getting L spine MRI as of 07/2016.  . Microcytic anemia 12/2015   suspect iron def due to malabsorption.  Hemoccults NEG 01/26/16, ferrous sulfate started.  . Neuropathic pain of left foot   . OSA on CPAP    cpap settings at 3    Past Surgical History:  Procedure Laterality Date  . ADENOIDECTOMY    . BREATH TEK H PYLORI N/A 01/08/2013   Procedure: [place];  Surgeon: [name], MD;  Location: [facility];  Service: General;  Laterality: N/A;  . CARDIAC CATHETERIZATION  03/2018   No signi obstructive CAD (suspected FALSE POSITIVE STRESS TEST).  EF 50%.  Diastolic dysfunction.  Marland Kitchen CARDIOVASCULAR STRESS TEST  03/2018   Intermediate risk; EF 40%, medium sized defect with peri-infarct ischemia-->follow up cath showed no signif obstructive CAD.  Marland Kitchen COLONOSCOPY  04/17/2014   Benign polyp x 1.  Recall 10 yrs.  Marland Kitchen GASTRIC ROUX-EN-Y N/A 04/14/2013   Procedure: LAPAROSCOPIC ROUX-EN-Y GASTRIC BYPASS WITH UPPER ENDOSCOPY;  Surgeon: [name], MD;  Location: [facility];  Service: General;  Laterality: N/A;  . LEFT HEART CATH AND CORONARY ANGIOGRAPHY N/A 03/22/2018   NO CAD.  EF 50%. Procedure: LEFT HEART CATH AND CORONARY ANGIOGRAPHY;  Surgeon: Belva Crome, MD;  Location:  [facility];  Service: Cardiovascular;  Laterality: N/A;  . TONSILLECTOMY    . TRANSTHORACIC ECHOCARDIOGRAM  05/13/2018   severe hypokinesis of LV inferior and inferolateral walls, EF 40-45%, correlates with stress test.    Family History  Problem Relation Age of Onset  . Heart disease Father        valvular disease  . COPD Father   . Prostate cancer Father   . Arrhythmia Father   . Colon cancer Neg Hx     SOCIAL HX:  Married, no children, paramedic. Father  died 03-29-17 after long chronic illness decline.   Current Outpatient Medications:  .  amLODipine (NORVASC) 10 MG tablet, Take 1 tablet (10 mg total) by mouth daily., Disp: 30 tablet, Rfl: 3 .  benazepril (LOTENSIN) 40 MG tablet, TAKE 1 TABLET BY MOUTH IN THE MORNING, Disp: 90 tablet, Rfl: 0 .  furosemide (LASIX) 40 MG tablet, Take 1 tablet (40 mg total) by mouth daily., Disp: 30 tablet, Rfl: 5 .  glipiZIDE (GLUCOTROL XL) 5 MG 24 hr tablet, Take 1 tablet by mouth once daily with breakfast, Disp: 90 tablet, Rfl: 0 .  Insulin Pen Needle 31G X 5 MM MISC, Use with Victoza as directed, Disp: 100 each, Rfl: 11 .  metFORMIN (GLUCOPHAGE) 1000 MG tablet, TAKE 1 TABLET BY MOUTH TWICE DAILY WITH A MEAL, Disp: 180 tablet, Rfl: 0 .  metoprolol tartrate (LOPRESSOR) 100 MG tablet, Take 1 tablet (100 mg total) by mouth 2 (two) times daily., Disp: 180 tablet, Rfl: 2 .  pregabalin (LYRICA) 150 MG capsule, TAKE 1 CAPSULE BY MOUTH TWICE DAILY (Patient taking differently: Take 150 mg by mouth 2 (two) times daily. ), Disp: 60 capsule, Rfl: 5 .  rivaroxaban (XARELTO) 20 MG TABS tablet, Take 1 tablet (20 mg total) by mouth daily with supper., Disp: 90 tablet, Rfl: 2 .  testosterone cypionate (DEPOTESTOSTERONE CYPIONATE) 200 MG/ML injection, 1 ml IM q 14 days, Disp: 10 mL, Rfl: 0 .  VICTOZA 18 MG/3ML SOPN, INJECT 1.2 MG TOTAL INTO THE SKIN ONCE DAILY, Disp: 6 mL, Rfl: 0 .  zaleplon (SONATA) 5 MG capsule, TAKE 1 CAPSULE BY MOUTH AT BEDTIME AS NEEDED FOR SLEEP (Patient taking differently: Take 5 mg by mouth at bedtime as needed for sleep. ), Disp: 90 capsule, Rfl: 1 .  ALPRAZolam (XANAX) 0.5 MG tablet, TAKE 1 TABLET BY MOUTH EVERY 8 HOURS AS NEEDED FOR SLEEP OR ANXIETY (Patient not taking: Reported on 02/27/2018), Disp: 90 tablet, Rfl: 5 .  benzonatate (TESSALON) 200 MG capsule, Take 1 capsule (200 mg total) by mouth 2 (two) times daily as needed for cough. (Patient not taking: Reported on 02/25/2018), Disp: 20 capsule, Rfl:  0  EXAM:  VITALS per patient if applicable: BP 716/96 (BP Location: Left Arm, Patient Position: Sitting, Cuff Size: Large)   Pulse 88    GENERAL: alert, oriented, appears well and in no acute distress  HEENT: atraumatic, conjunttiva clear, no obvious abnormalities on inspection of external nose and ears  NECK: normal movements of the head and neck  LUNGS: on inspection no signs of respiratory distress, breathing rate appears normal, no obvious gross SOB, gasping or wheezing  CV: no obvious cyanosis  MS: moves all visible extremities without noticeable abnormality  PSYCH/NEURO: pleasant and cooperative, no obvious depression or anxiety, speech and thought processing grossly intact  LABS: none today  Lab Results  Component Value Date   TESTOSTERONE 218.25 (L) 02/27/2018   Lab Results  Component Value Date  WBC 5.5 02/27/2018   HGB 13.6 03/22/2018   HCT 40.0 03/22/2018   MCV 89.2 02/27/2018   PLT 211.0 02/27/2018     Chemistry      Component Value Date/Time   NA 139 03/22/2018 0802   K 3.7 03/22/2018 0802   CL 100 02/27/2018 1054   CO2 30 02/27/2018 1054   BUN 12 02/27/2018 1054   CREATININE 0.90 03/22/2018 0802   CREATININE 0.86 02/23/2017 1553      Component Value Date/Time   CALCIUM 9.3 02/27/2018 1054   ALKPHOS 51 02/03/2018 1018   AST 26 02/03/2018 1018   ALT 27 02/03/2018 1018   BILITOT 0.6 02/03/2018 1018     Lab Results  Component Value Date   HGBA1C 7.3 (H) 02/27/2018    ASSESSMENT AND PLAN:  Discussed the following assessment and plan:  1) HTN: not ideal control. Add hctz 12.5mg  qd. BMET-future.  2) DM 2: control is fair. HbA1c future.  3) Chronic anxiety with insomnia: taking alprazolam sparingly, taking zaleplon hs on prn basis. Needs CSC renewed->we'll have him sign new contract when he comes in for labs soon.   4) Hx of inc PSA velocity: f/u PSA was back to baseline, next PSA due around 10/2018.  I discussed the assessment and  treatment plan with the patient. The patient was provided an opportunity to ask questions and all were answered. The patient agreed with the plan and demonstrated an understanding of the instructions.   The patient was advised to call back or seek an in-person evaluation if the symptoms worsen or if the condition fails to improve as anticipated.  F/u: 4 mo CPE  Signed:  Crissie Sickles, MD           06/05/2018

## 2018-06-07 ENCOUNTER — Other Ambulatory Visit: Payer: Self-pay | Admitting: Family Medicine

## 2018-06-13 ENCOUNTER — Other Ambulatory Visit: Payer: Self-pay

## 2018-06-13 ENCOUNTER — Ambulatory Visit (INDEPENDENT_AMBULATORY_CARE_PROVIDER_SITE_OTHER): Payer: Commercial Managed Care - PPO

## 2018-06-13 DIAGNOSIS — E119 Type 2 diabetes mellitus without complications: Secondary | ICD-10-CM | POA: Diagnosis not present

## 2018-06-13 DIAGNOSIS — I10 Essential (primary) hypertension: Secondary | ICD-10-CM | POA: Diagnosis not present

## 2018-06-13 LAB — BASIC METABOLIC PANEL
BUN: 13 mg/dL (ref 6–23)
CO2: 25 mEq/L (ref 19–32)
Calcium: 9.1 mg/dL (ref 8.4–10.5)
Chloride: 102 mEq/L (ref 96–112)
Creatinine, Ser: 0.96 mg/dL (ref 0.40–1.50)
GFR: 80.87 mL/min (ref >60.00)
Glucose, Bld: 144 mg/dL — ABNORMAL HIGH (ref 70–99)
Potassium: 4.4 mEq/L (ref 3.5–5.1)
Sodium: 137 mEq/L (ref 135–145)

## 2018-06-13 LAB — HEMOGLOBIN A1C: Hgb A1c MFr Bld: 7.8 % — ABNORMAL HIGH (ref 4.6–6.5)

## 2018-06-19 ENCOUNTER — Other Ambulatory Visit: Payer: Self-pay

## 2018-06-19 ENCOUNTER — Telehealth: Payer: Self-pay | Admitting: Family Medicine

## 2018-06-19 ENCOUNTER — Encounter: Payer: Self-pay | Admitting: Family Medicine

## 2018-06-19 MED ORDER — AMLODIPINE BESYLATE 10 MG PO TABS: 10.0000 mg | ORAL_TABLET | Freq: Every day | ORAL | 3 refills | Status: DC

## 2018-06-19 MED ORDER — GLIPIZIDE ER 10 MG PO TB24: 10.0000 mg | ORAL_TABLET | Freq: Every day | ORAL | 4 refills | Status: DC

## 2018-06-19 NOTE — Telephone Encounter (Signed)
Patient states Amlopdipine Rx is not at at the [facility]. Please send Rx

## 2018-06-19 NOTE — Telephone Encounter (Signed)
MyChart message read.

## 2018-06-27 ENCOUNTER — Other Ambulatory Visit: Payer: Self-pay | Admitting: Family Medicine

## 2018-06-28 ENCOUNTER — Other Ambulatory Visit: Payer: Self-pay | Admitting: Family Medicine

## 2018-07-01 ENCOUNTER — Telehealth (HOSPITAL_COMMUNITY): Payer: Self-pay | Admitting: *Deleted

## 2018-07-01 NOTE — Telephone Encounter (Signed)
Pt called in stating he has been back in AF for the last week. HR running from 90-130s. HR is 94 BP 154/80 right now. Some shortness of breath, no stamina. Pt to come for appt tomorrow. Pt in agreement.

## 2018-07-02 ENCOUNTER — Other Ambulatory Visit: Payer: Self-pay

## 2018-07-02 ENCOUNTER — Encounter (HOSPITAL_COMMUNITY): Payer: Self-pay | Admitting: Physician Assistant

## 2018-07-02 ENCOUNTER — Encounter: Payer: Self-pay | Admitting: Internal Medicine

## 2018-07-02 ENCOUNTER — Ambulatory Visit (HOSPITAL_COMMUNITY)
Admission: RE | Admit: 2018-07-02 | Discharge: 2018-07-02 | Disposition: A | Discharge status: Home / Self Care | Payer: Commercial Managed Care - PPO | Source: Ambulatory Visit | Attending: Physician Assistant | Admitting: Physician Assistant

## 2018-07-02 ENCOUNTER — Ambulatory Visit (INDEPENDENT_AMBULATORY_CARE_PROVIDER_SITE_OTHER): Payer: Commercial Managed Care - PPO | Admitting: Internal Medicine

## 2018-07-02 VITALS — BP 142/64 | HR 118 | Ht 73.0 in | Wt 297.0 lb

## 2018-07-02 VITALS — BP 128/70 | HR 58 | Ht 73.0 in | Wt 296.0 lb

## 2018-07-02 DIAGNOSIS — Z713 Dietary counseling and surveillance: Secondary | ICD-10-CM | POA: Diagnosis not present

## 2018-07-02 DIAGNOSIS — I4892 Unspecified atrial flutter: Secondary | ICD-10-CM | POA: Diagnosis not present

## 2018-07-02 DIAGNOSIS — Z6839 Body mass index (BMI) 39.0-39.9, adult: Secondary | ICD-10-CM | POA: Insufficient documentation

## 2018-07-02 DIAGNOSIS — I10 Essential (primary) hypertension: Secondary | ICD-10-CM | POA: Insufficient documentation

## 2018-07-02 DIAGNOSIS — E669 Obesity, unspecified: Secondary | ICD-10-CM | POA: Diagnosis not present

## 2018-07-02 DIAGNOSIS — Z7901 Long term (current) use of anticoagulants: Secondary | ICD-10-CM | POA: Insufficient documentation

## 2018-07-02 DIAGNOSIS — G4733 Obstructive sleep apnea (adult) (pediatric): Secondary | ICD-10-CM | POA: Insufficient documentation

## 2018-07-02 DIAGNOSIS — I483 Typical atrial flutter: Secondary | ICD-10-CM | POA: Diagnosis not present

## 2018-07-02 DIAGNOSIS — I4891 Unspecified atrial fibrillation: Secondary | ICD-10-CM | POA: Diagnosis present

## 2018-07-02 NOTE — Patient Instructions (Addendum)
Medication Instructions:  Your physician recommends that you continue on your current medications as directed. Please refer to the Current Medication list given to you today.  Labwork: None ordered.  Testing/Procedures: Your physician has recommended that you have an ablation. Catheter ablation is a medical procedure used to treat some cardiac arrhythmias (irregular heartbeats). During catheter ablation, a long, thin, flexible tube is put into a blood vessel in your groin (upper thigh), or neck. This tube is called an ablation catheter. It is then guided to your heart through the blood vessel. Radio frequency waves destroy small areas of heart tissue where abnormal heartbeats may cause an arrhythmia to start. Please see the instruction sheet given to you today.  Follow-Up:  You will follow up with Dr. Lovena Le 4 weeks after your procedure.   ABLATION INSTRUCTIONS:  COVID TESTING:  You will get your covid test at [facility] education building [address] on July 3 at 10:30 am You will pull up to the testing site and stay in your car.  The nursing staff will come to test you.  After your test you will need to go home and self quarantine until the day of your procedure.  Please arrive to ADMITTING down the hall from the [facility] main entrance of [facility] at:  7:30 am on July 09, 2018  Do not eat or drink after midnight prior to procedure  Do NOT take your metoprolol for 2 days prior to your procedure.  Your last dose will be July 06, 2018 your PM dose. Do NOT take ANY medications on the morning of your procedure.  Plan for one night stay but you may be discharged after your procedure.  You will need someone to drive you home at discharge.    Cardiac Ablation Cardiac ablation is a procedure to disable (ablate) a small amount of heart tissue in very specific places. The heart has many electrical connections. Sometimes these connections are abnormal and can cause the  heart to beat very fast or irregularly. Ablating some of the problem areas can improve the heart rhythm or return it to normal. Ablation may be done for people who:  Have Wolff-Parkinson-White syndrome.  Have fast heart rhythms (tachycardia).  Have taken medicines for an abnormal heart rhythm (arrhythmia) that were not effective or caused side effects.  Have a high-risk heartbeat that may be life-threatening. During the procedure, a small incision is made in the neck or the groin, and a long, thin, flexible tube (catheter) is inserted into the incision and moved to the heart. Small devices (electrodes) on the tip of the catheter will send out electrical currents. A type of X-ray (fluoroscopy) will be used to help guide the catheter and to provide images of the heart. Tell a health care provider about:  Any allergies you have.  All medicines you are taking, including vitamins, herbs, eye drops, creams, and over-the-counter medicines.  Any problems you or family members have had with anesthetic medicines.  Any blood disorders you have.  Any surgeries you have had.  Any medical conditions you have, such as kidney failure.  Whether you are pregnant or may be pregnant. What are the risks? Generally, this is a safe procedure. However, problems may occur, including:  Infection.  Bruising and bleeding at the catheter insertion site.  Bleeding into the chest, especially into the sac that surrounds the heart. This is a serious complication.  Stroke or blood clots.  Damage to other structures or organs.  Allergic reaction to medicines or dyes.  Need for a permanent pacemaker if the normal electrical system is damaged. A pacemaker is a small computer that sends electrical signals to the heart and helps your heart beat normally.  The procedure not being fully effective. This may not be recognized until months later. Repeat ablation procedures are sometimes required. What happens before  the procedure?  Follow instructions from your health care provider about eating or drinking restrictions.  Ask your health care provider about: ? Changing or stopping your regular medicines. This is especially important if you are taking diabetes medicines or blood thinners. ? Taking medicines such as aspirin and ibuprofen. These medicines can thin your blood. Do not take these medicines before your procedure if your health care provider instructs you not to.  Plan to have someone take you home from the hospital or clinic.  If you will be going home right after the procedure, plan to have someone with you for 24 hours. What happens during the procedure?  To lower your risk of infection: ? Your health care team will wash or sanitize their hands. ? Your skin will be washed with soap. ? Hair may be removed from the incision area.  An IV tube will be inserted into one of your veins.  You will be given a medicine to help you relax (sedative).  The skin on your neck or groin will be numbed.  An incision will be made in your neck or your groin.  A needle will be inserted through the incision and into a large vein in your neck or groin.  A catheter will be inserted into the needle and moved to your heart.  Dye may be injected through the catheter to help your surgeon see the area of the heart that needs treatment.  Electrical currents will be sent from the catheter to ablate heart tissue in desired areas. There are three types of energy that may be used to ablate heart tissue: ? Heat (radiofrequency energy). ? Laser energy. ? Extreme cold (cryoablation).  When the necessary tissue has been ablated, the catheter will be removed.  Pressure will be held on the catheter insertion area to prevent excessive bleeding.  A bandage (dressing) will be placed over the catheter insertion area. The procedure may vary among health care providers and hospitals. What happens after the  procedure?  Your blood pressure, heart rate, breathing rate, and blood oxygen level will be monitored until the medicines you were given have worn off.  Your catheter insertion area will be monitored for bleeding. You will need to lie still for a few hours to ensure that you do not bleed from the catheter insertion area.  Do not drive for 24 hours or as long as directed by your health care provider. Summary  Cardiac ablation is a procedure to disable (ablate) a small amount of heart tissue in very specific places. Ablating some of the problem areas can improve the heart rhythm or return it to normal.  During the procedure, electrical currents will be sent from the catheter to ablate heart tissue in desired areas. This information is not intended to replace advice given to you by your health care provider. Make sure you discuss any questions you have with your health care provider. Document Released: 05/07/2008 Document Revised: 06/11/2017 Document Reviewed: 11/08/2015 Elsevier Patient Education  2020 [employer].

## 2018-07-02 NOTE — Progress Notes (Signed)
HPI Ethan Pitts is referred by Dr. Anitra Lauth for evaluation of atrial flutter. The patient has never been diagnosed with atrial flutter until 4 months ago when he noted to have a high HR on his electronic device. He had an ECG which showed atrial flutter with a RVR. He was treated with AV nodal blocking drugs. He has been placed on Xarelto and denies missing any doses. He has never had syncope. He does not feel palpitations but notes that when he tries to perform any physical activity, he gets sob and has to stop.  No Known Allergies   Current Outpatient Medications  Medication Sig Dispense Refill  . ALPRAZolam (XANAX) 0.5 MG tablet TAKE 1 TABLET BY MOUTH EVERY 8 HOURS AS NEEDED FOR SLEEP OR ANXIETY 90 tablet 5  . amLODipine (NORVASC) 10 MG tablet Take 1 tablet (10 mg total) by mouth daily. 30 tablet 3  . benazepril (LOTENSIN) 40 MG tablet TAKE 1 TABLET BY MOUTH IN THE MORNING 90 tablet 0  . furosemide (LASIX) 40 MG tablet Take 1 tablet (40 mg total) by mouth daily. 30 tablet 5  . glipiZIDE (GLUCOTROL XL) 10 MG 24 hr tablet Take 1 tablet (10 mg total) by mouth daily with breakfast. 30 tablet 4  . hydrochlorothiazide (MICROZIDE) 12.5 MG capsule Take 1 capsule (12.5 mg total) by mouth daily. 30 capsule 3  . Insulin Pen Needle 31G X 5 MM MISC Use with Victoza as directed 100 each 11  . metFORMIN (GLUCOPHAGE) 1000 MG tablet TAKE 1 TABLET BY MOUTH TWICE DAILY WITH A MEAL 180 tablet 0  . metoprolol tartrate (LOPRESSOR) 100 MG tablet Take 1 tablet (100 mg total) by mouth 2 (two) times daily. 180 tablet 2  . pregabalin (LYRICA) 150 MG capsule Take 1 capsule by mouth twice daily 60 capsule 0  . rivaroxaban (XARELTO) 20 MG TABS tablet Take 1 tablet (20 mg total) by mouth daily with supper. 90 tablet 2  . testosterone cypionate (DEPOTESTOSTERONE CYPIONATE) 200 MG/ML injection 1 ml IM q 14 days 10 mL 0  . VICTOZA 18 MG/3ML SOPN INJECT 1.2 MG SUBCUTANEOUSLY  ONCE DAILY 6 mL 0  . zaleplon (SONATA) 5 MG  capsule TAKE 1 CAPSULE BY MOUTH AT BEDTIME AS NEEDED FOR SLEEP (Patient taking differently: Take 5 mg by mouth at bedtime as needed for sleep. ) 90 capsule 1   No current facility-administered medications for this visit.      Past Medical History:  Diagnosis Date  . Anxiety   . Arthritis    lower back   . Atrial flutter ([place]) 2020   No cardioversion needed.  Cards->lopressor and anticoag  . Chronic systolic (congestive) heart failure ([place]) 05/2018   DOE->echo and stress test showed EF 40-45%, +LV hypokinesis.  . Depression   . Diabetes mellitus   . False positive stress test 03/2018   Cath showed normal coronaries with EF 50%  . GERD (gastroesophageal reflux disease)   . Hyperlipidemia   . Hypertension   . Hypogonadism, male 05/2015  . Increased prostate specific antigen (PSA) velocity 2019   06/2017 velocity up; recheck 10/2017 back to baseline.  Plan repeat 1 yr.  . Low back pain 2016   Left lumbar radiculopathy, lumbar spondylolisthesis.  Sciatica episode 03/2016.  Ortho getting L spine MRI as of 07/2016.  . Microcytic anemia 12/2015   suspect iron def due to malabsorption.  Hemoccults NEG 01/26/16, ferrous sulfate started.  . Neuropathic pain of left foot   .  OSA on CPAP    cpap settings at 3    ROS:   All systems reviewed and negative except as noted in the HPI.   Past Surgical History:  Procedure Laterality Date  . ADENOIDECTOMY    . BREATH TEK H PYLORI N/A 01/08/2013   Procedure: [place];  Surgeon: [name], MD;  Location: [facility];  Service: General;  Laterality: N/A;  . CARDIAC CATHETERIZATION  03/2018   No signi obstructive CAD (suspected FALSE POSITIVE STRESS TEST).  EF 50%.  Diastolic dysfunction.  Marland Kitchen CARDIOVASCULAR STRESS TEST  03/2018   Intermediate risk; EF 40%, medium sized defect with peri-infarct ischemia-->follow up cath showed no signif obstructive CAD.  Marland Kitchen COLONOSCOPY  04/17/2014   Benign polyp x 1.  Recall 10 yrs.  Marland Kitchen GASTRIC  ROUX-EN-Y N/A 04/14/2013   Procedure: LAPAROSCOPIC ROUX-EN-Y GASTRIC BYPASS WITH UPPER ENDOSCOPY;  Surgeon: [name], MD;  Location: [facility];  Service: General;  Laterality: N/A;  . LEFT HEART CATH AND CORONARY ANGIOGRAPHY N/A 03/22/2018   NO CAD.  EF 50%. Procedure: LEFT HEART CATH AND CORONARY ANGIOGRAPHY;  Surgeon: Belva Crome, MD;  Location: [facility];  Service: Cardiovascular;  Laterality: N/A;  . TONSILLECTOMY    . TRANSTHORACIC ECHOCARDIOGRAM  05/13/2018   severe hypokinesis of LV inferior and inferolateral walls, EF 40-45%, correlates with stress test.     Family History  Problem Relation Age of Onset  . Heart disease Father        valvular disease  . COPD Father   . Prostate cancer Father   . Arrhythmia Father   . Colon cancer Neg Hx      Social History   Socioeconomic History  . Marital status: Divorced    Spouse name: Not on file  . Number of children: Not on file  . Years of education: Not on file  . Highest education level: Not on file  Occupational History  . 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 Financial resource strain: Not on file  . Food insecurity    Worry: Not on file    Inability: Not on file  . Transportation needs    Medical: Not on file    Non-medical: Not on file  Tobacco Use  . Smoking status: Never Smoker  . Smokeless tobacco: Former Systems developer    Types: Chew  Substance and Sexual Activity  . Alcohol use: Yes    Alcohol/week: 12.0 standard drinks    Types: 12 Cans of beer per week    Comment:  12pk/per week  . Drug use: No  . Sexual activity: Not on file  Lifestyle  . Physical activity    Days per week: Not on file    Minutes per session: Not on file  . Stress: Not on file  Relationships  . Social Herbalist on phone: Not on file    Gets together: Not on file    Attends religious service: Not on file    Active member of club or organization: Not on file    Attends  meetings of clubs or organizations: Not on file    Relationship status: Not on file  . Intimate partner violence    Fear of current or ex partner: Not on file    Emotionally abused: Not on file    Physically abused: Not on file    Forced sexual activity: Not on file  Other  Topics Concern  . Not on file  Social History Narrative   Married, no children.   Occupation: paramedic     BP 128/70   Pulse (!) 58   Ht 6\' 1"  (1.854 m)   Wt 296 lb (134.3 kg)   SpO2 96%   BMI 39.05 kg/m   Physical Exam:  Well appearing NAD HEENT: Unremarkable Neck:  6 cm JVD, no thyromegally Lymphatics:  No adenopathy Back:  No CVA tenderness Lungs:  Clear with no wheezes HEART:  IRegular rate rhythm, no murmurs, no rubs, no clicks Abd:  soft, positive bowel sounds, no organomegally, no rebound, no guarding Ext:  2 plus pulses, no edema, no cyanosis, no clubbing Skin:  No rashes no nodules Neuro:  CN II through XII intact, motor grossly intact  EKG - reviewed typical atrial flutter  Assess/Plan: 1. Atrial flutter - his rates are controlled. He has been anti-coagulated. I have discussed the indications/risks/benefits/goals/expectations of EP study and catheter ablation and he wishes to proceed and will call us when he would like to have this done. He will hold his Xarelto just prior to the procedure. 2. Obesity - I carefully discussed the importance of weight loss in the treatment of all atrial arrhythmias. He states that he is willing to try. 3. HTN - his bp is controlled. He is encouraged to take is meds, avoid salty foods and to lose weight.  Mikle Bosworth.D.

## 2018-07-02 NOTE — Progress Notes (Signed)
Primary Care Physician: Tammi Sou, MD  Referring Physician: [facility]   Ethan Pitts is a 58 y.o. male with a history of atrial flutter, OSA, HTN, DM who presents for consultation in the [facility].  The patient was initially diagnosed with atrial flutter 02/03/18 when his Apple Watch showed that he had an irregular rhythm. Patient works in ambulance transport and did a rhythm strip which read as atrial fibrillation however, at the ER he was in atypical atrial flutter.. He had no awareness of his arrhythmia.  On follow up today, patient reports that about one week ago he got an alert on his Apple Watch that he was out of rhythm again. He was again asymptomatic but his HR has been elevated as high as 140 bpm. No triggers that the patient could identify.   Today, he denies symptoms of palpitations, chest pain, orthopnea, PND, lower extremity edema, dizziness, presyncope, syncope, snoring, daytime somnolence, bleeding, or neurologic sequela. The patient is tolerating medications without difficulties and is otherwise without complaint today.    Atrial Fibrillation Risk Factors:  he does have symptoms or diagnosis of sleep apnea. he is compliant with CPAP therapy. he does not have a history of rheumatic fever. he does not have a history of significant alcohol use.  he has a BMI of Body mass index is 39.18 kg/m.Marland Kitchen Filed Weights   07/02/18 1431  Weight: 134.7 kg     Atrial Fibrillation Management history:  Previous antiarrhythmic drugs: none Previous cardioversions: none Previous ablations: none CHADS2VASC score: 2 (DM, HTN) Anticoagulation history: Xarelto   Past Medical History:  Diagnosis Date  . Anxiety   . Arthritis    lower back   . Atrial flutter ([place]) 2020   No cardioversion needed.  Cards->lopressor and anticoag  . Chronic systolic (congestive) heart failure ([place]) 05/2018   DOE->echo and stress test showed EF 40-45%, +LV  hypokinesis.  . Depression   . Diabetes mellitus   . False positive stress test 03/2018   Cath showed normal coronaries with EF 50%  . GERD (gastroesophageal reflux disease)   . Hyperlipidemia   . Hypertension   . Hypogonadism, male 05/2015  . Increased prostate specific antigen (PSA) velocity 2019   06/2017 velocity up; recheck 10/2017 back to baseline.  Plan repeat 1 yr.  . Low back pain 2016   Left lumbar radiculopathy, lumbar spondylolisthesis.  Sciatica episode 03/2016.  Ortho getting L spine MRI as of 07/2016.  . Microcytic anemia 12/2015   suspect iron def due to malabsorption.  Hemoccults NEG 01/26/16, ferrous sulfate started.  . Neuropathic pain of left foot   . OSA on CPAP    cpap settings at 3   Past Surgical History:  Procedure Laterality Date  . ADENOIDECTOMY    . BREATH TEK H PYLORI N/A 01/08/2013   Procedure: [place];  Surgeon: [name], MD;  Location: [facility];  Service: General;  Laterality: N/A;  . CARDIAC CATHETERIZATION  03/2018   No signi obstructive CAD (suspected FALSE POSITIVE STRESS TEST).  EF 50%.  Diastolic dysfunction.  Marland Kitchen CARDIOVASCULAR STRESS TEST  03/2018   Intermediate risk; EF 40%, medium sized defect with peri-infarct ischemia-->follow up cath showed no signif obstructive CAD.  Marland Kitchen COLONOSCOPY  04/17/2014   Benign polyp x 1.  Recall 10 yrs.  Marland Kitchen GASTRIC ROUX-EN-Y N/A 04/14/2013   Procedure: LAPAROSCOPIC ROUX-EN-Y GASTRIC BYPASS WITH UPPER ENDOSCOPY;  Surgeon: [name], MD;  Location: [facility];  Service: General;  Laterality: N/A;  . LEFT HEART CATH AND CORONARY ANGIOGRAPHY N/A 03/22/2018   NO CAD.  EF 50%. Procedure: LEFT HEART CATH AND CORONARY ANGIOGRAPHY;  Surgeon: Belva Crome, MD;  Location: [facility];  Service: Cardiovascular;  Laterality: N/A;  . TONSILLECTOMY    . TRANSTHORACIC ECHOCARDIOGRAM  05/13/2018   severe hypokinesis of LV inferior and inferolateral walls, EF 40-45%, correlates with stress test.     Current Outpatient Medications  Medication Sig Dispense Refill  . amLODipine (NORVASC) 10 MG tablet Take 1 tablet (10 mg total) by mouth daily. 30 tablet 3  . benazepril (LOTENSIN) 40 MG tablet TAKE 1 TABLET BY MOUTH IN THE MORNING 90 tablet 0  . furosemide (LASIX) 40 MG tablet Take 1 tablet (40 mg total) by mouth daily. 30 tablet 5  . glipiZIDE (GLUCOTROL XL) 10 MG 24 hr tablet Take 1 tablet (10 mg total) by mouth daily with breakfast. 30 tablet 4  . hydrochlorothiazide (MICROZIDE) 12.5 MG capsule Take 1 capsule (12.5 mg total) by mouth daily. 30 capsule 3  . Insulin Pen Needle 31G X 5 MM MISC Use with Victoza as directed 100 each 11  . metFORMIN (GLUCOPHAGE) 1000 MG tablet TAKE 1 TABLET BY MOUTH TWICE DAILY WITH A MEAL 180 tablet 0  . metoprolol tartrate (LOPRESSOR) 100 MG tablet Take 1 tablet (100 mg total) by mouth 2 (two) times daily. 180 tablet 2  . pregabalin (LYRICA) 150 MG capsule Take 1 capsule by mouth twice daily 60 capsule 0  . rivaroxaban (XARELTO) 20 MG TABS tablet Take 1 tablet (20 mg total) by mouth daily with supper. 90 tablet 2  . testosterone cypionate (DEPOTESTOSTERONE CYPIONATE) 200 MG/ML injection 1 ml IM q 14 days 10 mL 0  . VICTOZA 18 MG/3ML SOPN INJECT 1.2 MG SUBCUTANEOUSLY  ONCE DAILY 6 mL 0  . zaleplon (SONATA) 5 MG capsule TAKE 1 CAPSULE BY MOUTH AT BEDTIME AS NEEDED FOR SLEEP (Patient taking differently: Take 5 mg by mouth at bedtime as needed for sleep. ) 90 capsule 1  . ALPRAZolam (XANAX) 0.5 MG tablet TAKE 1 TABLET BY MOUTH EVERY 8 HOURS AS NEEDED FOR SLEEP OR ANXIETY 90 tablet 5   No current facility-administered medications for this encounter.     No Known Allergies  Social History   Socioeconomic History  . Marital status: Divorced    Spouse name: Not on file  . Number of children: Not on file  . Years of education: Not on file  . Highest education level: Not on file  Occupational History  . Occupation: Paramedic-EMT    Employer: [employer]  . Financial resource strain: Not on file  . Food insecurity    Worry: Not on file    Inability: Not on file  . Transportation needs    Medical: Not on file    Non-medical: Not on file  Tobacco Use  . Smoking status: Never Smoker  . Smokeless tobacco: Former Systems developer    Types: Chew  Substance and Sexual Activity  . Alcohol use: Yes    Alcohol/week: 12.0 standard drinks    Types: 12 Cans of beer per week    Comment:  12pk/per week  . Drug use: No  . Sexual activity: Not on file  Lifestyle  . Physical activity    Days per week: Not on file    Minutes per session: Not on file  . Stress: Not on  file  Relationships  . Social Herbalist on phone: Not on file    Gets together: Not on file    Attends religious service: Not on file    Active member of club or organization: Not on file    Attends meetings of clubs or organizations: Not on file    Relationship status: Not on file  . Intimate partner violence    Fear of current or ex partner: Not on file    Emotionally abused: Not on file    Physically abused: Not on file    Forced sexual activity: Not on file  Other Topics Concern  . Not on file  Social History Narrative   Married, no children.   Occupation: paramedic    Family History  Problem Relation Age of Onset  . Heart disease Father        valvular disease  . COPD Father   . Prostate cancer Father   . Arrhythmia Father   . Colon cancer Neg Hx    The patient does not have a history of early familial atrial fibrillation or other arrhythmias.  ROS- All systems are reviewed and negative except as per the HPI above.  Physical Exam: Vitals:   07/02/18 1431  BP: (!) 142/64  Pulse: (!) 118  Weight: 134.7 kg  Height: 6\' 1"  (1.854 m)    GEN- The patient is well appearing obese male, alert and oriented x 3 today.   HEENT-head normocephalic, atraumatic, sclera clear, conjunctiva pink, hearing intact, trachea midline. Lungs- Clear to  ausculation bilaterally, normal work of breathing Heart- irregular rate and rhythm, no murmurs, rubs or gallops  GI- soft, NT, ND, + BS Extremities- no clubbing, cyanosis, or edema MS- no significant deformity or atrophy Skin- no rash or lesion Psych- euthymic mood, full affect Neuro- strength and sensation are intact    Wt Readings from Last 3 Encounters:  07/02/18 134.3 kg  07/02/18 134.7 kg  03/22/18 131.5 kg    EKG today demonstrates typical atrial flutter with variable conduction HR 118, LAFB, QRS 114, QTc 431. NO STEMI  Epic records are reviewed at length today  [facility] 03/22/18  Elevated left ventricular end-diastolic pressure, 21 mmHg. EF 50%. Findings consistent with diastolic dysfunction.  Widely patent coronary arteries.  Normal left main  Normal LAD. Ostial first diagonal 60% narrowed.  Normal circumflex  Normal right coronary  False positive myocardial perfusion study related to diaphragm attenuation  Echo 05/13/18 1. Severe akinesis of the left ventricular, entire inferior wall and inferolateral wall.  2. Findings corrleate with NUC stress test.     Consider cardiac MRI to more clearly define anatomy, EF.     Images were challenging despite Definity constrast.  3. The left ventricle has mild-moderately reduced systolic function, with an ejection fraction of 40-45%.  LA 5.3 cm   Assessment and Plan:  1. Typical atrial flutter Patient has asymptomatic atrial flutter with rapid rates. Discussed therapeutic options including ablation. No documented h/o afib. After discussing risks and benefits, patient would like to go ahead with procedure. Will refer to Dr Lovena Le for evaluation. Continue Xarelto 20 mg daily.  Continue metoprolol 100 mg BID.  This patients CHA2DS2-VASc Score and unadjusted Ischemic Stroke Rate (% per year) is equal to 2.2 % stroke rate/year from a score of 2  Above score calculated as 1 point each if present [CHF, HTN, DM,  Vascular=MI/PAD/Aortic Plaque, Age if 65-74, or Male] Above score calculated as 2 points each if  present [Age > 75, or Stroke/TIA/TE]  2. Obesity Body mass index is 39.18 kg/m. Lifestyle modification was discussed and encouraged including regular physical activity and weight reduction.  3. Obstructive sleep apnea The importance of adequate treatment of sleep apnea was discussed today in order to improve our ability to maintain sinus rhythm long term. Encouraged compliance with CPAP therapy.   4. HTN Stable, no changes today.   Follow up with Dr Lovena Le and Dr Stanford Breed as scheduled.    [facility] [address] [place], [place] [address] [phone] 07/02/2018 4:37 PM

## 2018-07-02 NOTE — H&P (View-Only) (Signed)
HPI Ethan Pitts is referred by Dr. Anitra Lauth for evaluation of atrial flutter. The patient has never been diagnosed with atrial flutter until 4 months ago when he noted to have a high HR on his electronic device. He had an ECG which showed atrial flutter with a RVR. He was treated with AV nodal blocking drugs. He has been placed on Xarelto and denies missing any doses. He has never had syncope. He does not feel palpitations but notes that when he tries to perform any physical activity, he gets sob and has to stop.  No Known Allergies   Current Outpatient Medications  Medication Sig Dispense Refill  . ALPRAZolam (XANAX) 0.5 MG tablet TAKE 1 TABLET BY MOUTH EVERY 8 HOURS AS NEEDED FOR SLEEP OR ANXIETY 90 tablet 5  . amLODipine (NORVASC) 10 MG tablet Take 1 tablet (10 mg total) by mouth daily. 30 tablet 3  . benazepril (LOTENSIN) 40 MG tablet TAKE 1 TABLET BY MOUTH IN THE MORNING 90 tablet 0  . furosemide (LASIX) 40 MG tablet Take 1 tablet (40 mg total) by mouth daily. 30 tablet 5  . glipiZIDE (GLUCOTROL XL) 10 MG 24 hr tablet Take 1 tablet (10 mg total) by mouth daily with breakfast. 30 tablet 4  . hydrochlorothiazide (MICROZIDE) 12.5 MG capsule Take 1 capsule (12.5 mg total) by mouth daily. 30 capsule 3  . Insulin Pen Needle 31G X 5 MM MISC Use with Victoza as directed 100 each 11  . metFORMIN (GLUCOPHAGE) 1000 MG tablet TAKE 1 TABLET BY MOUTH TWICE DAILY WITH A MEAL 180 tablet 0  . metoprolol tartrate (LOPRESSOR) 100 MG tablet Take 1 tablet (100 mg total) by mouth 2 (two) times daily. 180 tablet 2  . pregabalin (LYRICA) 150 MG capsule Take 1 capsule by mouth twice daily 60 capsule 0  . rivaroxaban (XARELTO) 20 MG TABS tablet Take 1 tablet (20 mg total) by mouth daily with supper. 90 tablet 2  . testosterone cypionate (DEPOTESTOSTERONE CYPIONATE) 200 MG/ML injection 1 ml IM q 14 days 10 mL 0  . VICTOZA 18 MG/3ML SOPN INJECT 1.2 MG SUBCUTANEOUSLY  ONCE DAILY 6 mL 0  . zaleplon (SONATA) 5 MG  capsule TAKE 1 CAPSULE BY MOUTH AT BEDTIME AS NEEDED FOR SLEEP (Patient taking differently: Take 5 mg by mouth at bedtime as needed for sleep. ) 90 capsule 1   No current facility-administered medications for this visit.      Past Medical History:  Diagnosis Date  . Anxiety   . Arthritis    lower back   . Atrial flutter ([place]) 2020   No cardioversion needed.  Cards->lopressor and anticoag  . Chronic systolic (congestive) heart failure ([place]) 05/2018   DOE->echo and stress test showed EF 40-45%, +LV hypokinesis.  . Depression   . Diabetes mellitus   . False positive stress test 03/2018   Cath showed normal coronaries with EF 50%  . GERD (gastroesophageal reflux disease)   . Hyperlipidemia   . Hypertension   . Hypogonadism, male 05/2015  . Increased prostate specific antigen (PSA) velocity 2019   06/2017 velocity up; recheck 10/2017 back to baseline.  Plan repeat 1 yr.  . Low back pain 2016   Left lumbar radiculopathy, lumbar spondylolisthesis.  Sciatica episode 03/2016.  Ortho getting L spine MRI as of 07/2016.  . Microcytic anemia 12/2015   suspect iron def due to malabsorption.  Hemoccults NEG 01/26/16, ferrous sulfate started.  . Neuropathic pain of left foot   .  OSA on CPAP    cpap settings at 3    ROS:   All systems reviewed and negative except as noted in the HPI.   Past Surgical History:  Procedure Laterality Date  . ADENOIDECTOMY    . BREATH TEK H PYLORI N/A 01/08/2013   Procedure: [place];  Surgeon: [name], MD;  Location: [facility];  Service: General;  Laterality: N/A;  . CARDIAC CATHETERIZATION  03/2018   No signi obstructive CAD (suspected FALSE POSITIVE STRESS TEST).  EF 50%.  Diastolic dysfunction.  Marland Kitchen CARDIOVASCULAR STRESS TEST  03/2018   Intermediate risk; EF 40%, medium sized defect with peri-infarct ischemia-->follow up cath showed no signif obstructive CAD.  Marland Kitchen COLONOSCOPY  04/17/2014   Benign polyp x 1.  Recall 10 yrs.  Marland Kitchen GASTRIC  ROUX-EN-Y N/A 04/14/2013   Procedure: LAPAROSCOPIC ROUX-EN-Y GASTRIC BYPASS WITH UPPER ENDOSCOPY;  Surgeon: [name], MD;  Location: [facility];  Service: General;  Laterality: N/A;  . LEFT HEART CATH AND CORONARY ANGIOGRAPHY N/A 03/22/2018   NO CAD.  EF 50%. Procedure: LEFT HEART CATH AND CORONARY ANGIOGRAPHY;  Surgeon: Belva Crome, MD;  Location: [facility];  Service: Cardiovascular;  Laterality: N/A;  . TONSILLECTOMY    . TRANSTHORACIC ECHOCARDIOGRAM  05/13/2018   severe hypokinesis of LV inferior and inferolateral walls, EF 40-45%, correlates with stress test.     Family History  Problem Relation Age of Onset  . Heart disease Father        valvular disease  . COPD Father   . Prostate cancer Father   . Arrhythmia Father   . Colon cancer Neg Hx      Social History   Socioeconomic History  . Marital status: Divorced    Spouse name: Not on file  . Number of children: Not on file  . Years of education: Not on file  . Highest education level: Not on file  Occupational History  . Occupation: Paramedic-EMT    Employer: [employer]  . Financial resource strain: Not on file  . Food insecurity    Worry: Not on file    Inability: Not on file  . Transportation needs    Medical: Not on file    Non-medical: Not on file  Tobacco Use  . Smoking status: Never Smoker  . Smokeless tobacco: Former Systems developer    Types: Chew  Substance and Sexual Activity  . Alcohol use: Yes    Alcohol/week: 12.0 standard drinks    Types: 12 Cans of beer per week    Comment:  12pk/per week  . Drug use: No  . Sexual activity: Not on file  Lifestyle  . Physical activity    Days per week: Not on file    Minutes per session: Not on file  . Stress: Not on file  Relationships  . Social Herbalist on phone: Not on file    Gets together: Not on file    Attends religious service: Not on file    Active member of club or organization: Not on file    Attends  meetings of clubs or organizations: Not on file    Relationship status: Not on file  . Intimate partner violence    Fear of current or ex partner: Not on file    Emotionally abused: Not on file    Physically abused: Not on file    Forced sexual activity: Not on file  Other  Topics Concern  . Not on file  Social History Narrative   Married, no children.   Occupation: paramedic     BP 128/70   Pulse (!) 58   Ht 6\' 1"  (1.854 m)   Wt 296 lb (134.3 kg)   SpO2 96%   BMI 39.05 kg/m   Physical Exam:  Well appearing NAD HEENT: Unremarkable Neck:  6 cm JVD, no thyromegally Lymphatics:  No adenopathy Back:  No CVA tenderness Lungs:  Clear with no wheezes HEART:  IRegular rate rhythm, no murmurs, no rubs, no clicks Abd:  soft, positive bowel sounds, no organomegally, no rebound, no guarding Ext:  2 plus pulses, no edema, no cyanosis, no clubbing Skin:  No rashes no nodules Neuro:  CN II through XII intact, motor grossly intact  EKG - reviewed typical atrial flutter  Assess/Plan: 1. Atrial flutter - his rates are controlled. He has been anti-coagulated. I have discussed the indications/risks/benefits/goals/expectations of EP study and catheter ablation and he wishes to proceed and will call us when he would like to have this done. He will hold his Xarelto just prior to the procedure. 2. Obesity - I carefully discussed the importance of weight loss in the treatment of all atrial arrhythmias. He states that he is willing to try. 3. HTN - his bp is controlled. He is encouraged to take is meds, avoid salty foods and to lose weight.  Mikle Bosworth.D.

## 2018-07-05 ENCOUNTER — Other Ambulatory Visit (HOSPITAL_COMMUNITY)
Admission: RE | Admit: 2018-07-05 | Discharge: 2018-07-05 | Disposition: A | Discharge status: Home / Self Care | Payer: Commercial Managed Care - PPO | Source: Ambulatory Visit | Attending: Internal Medicine | Admitting: Internal Medicine

## 2018-07-05 DIAGNOSIS — Z01812 Encounter for preprocedural laboratory examination: Secondary | ICD-10-CM | POA: Diagnosis present

## 2018-07-05 DIAGNOSIS — Z1159 Encounter for screening for other viral diseases: Secondary | ICD-10-CM | POA: Diagnosis not present

## 2018-07-05 LAB — SARS CORONAVIRUS 2 (TAT 6-24 HRS): SARS Coronavirus 2: NEGATIVE

## 2018-07-09 ENCOUNTER — Other Ambulatory Visit: Payer: Self-pay

## 2018-07-09 ENCOUNTER — Ambulatory Visit (HOSPITAL_COMMUNITY)
Admission: RE | Admit: 2018-07-09 | Discharge: 2018-07-09 | Disposition: A | Discharge status: Home / Self Care | Payer: Commercial Managed Care - PPO | Attending: Internal Medicine | Admitting: Internal Medicine

## 2018-07-09 ENCOUNTER — Encounter (HOSPITAL_COMMUNITY)
Admission: RE | Disposition: A | Discharge status: Home / Self Care | Payer: Self-pay | Source: Home / Self Care | Attending: Internal Medicine

## 2018-07-09 DIAGNOSIS — G4733 Obstructive sleep apnea (adult) (pediatric): Secondary | ICD-10-CM | POA: Insufficient documentation

## 2018-07-09 DIAGNOSIS — Z794 Long term (current) use of insulin: Secondary | ICD-10-CM | POA: Insufficient documentation

## 2018-07-09 DIAGNOSIS — Z6839 Body mass index (BMI) 39.0-39.9, adult: Secondary | ICD-10-CM | POA: Diagnosis not present

## 2018-07-09 DIAGNOSIS — Z9884 Bariatric surgery status: Secondary | ICD-10-CM | POA: Insufficient documentation

## 2018-07-09 DIAGNOSIS — I5022 Chronic systolic (congestive) heart failure: Secondary | ICD-10-CM | POA: Insufficient documentation

## 2018-07-09 DIAGNOSIS — I110 Hypertensive heart disease with heart failure: Secondary | ICD-10-CM | POA: Insufficient documentation

## 2018-07-09 DIAGNOSIS — I483 Typical atrial flutter: Secondary | ICD-10-CM | POA: Insufficient documentation

## 2018-07-09 DIAGNOSIS — Z8249 Family history of ischemic heart disease and other diseases of the circulatory system: Secondary | ICD-10-CM | POA: Diagnosis not present

## 2018-07-09 DIAGNOSIS — M1990 Unspecified osteoarthritis, unspecified site: Secondary | ICD-10-CM | POA: Insufficient documentation

## 2018-07-09 DIAGNOSIS — E119 Type 2 diabetes mellitus without complications: Secondary | ICD-10-CM | POA: Diagnosis not present

## 2018-07-09 DIAGNOSIS — K219 Gastro-esophageal reflux disease without esophagitis: Secondary | ICD-10-CM | POA: Insufficient documentation

## 2018-07-09 DIAGNOSIS — E669 Obesity, unspecified: Secondary | ICD-10-CM | POA: Diagnosis not present

## 2018-07-09 DIAGNOSIS — Z7901 Long term (current) use of anticoagulants: Secondary | ICD-10-CM | POA: Insufficient documentation

## 2018-07-09 DIAGNOSIS — Z79899 Other long term (current) drug therapy: Secondary | ICD-10-CM | POA: Diagnosis not present

## 2018-07-09 DIAGNOSIS — I4892 Unspecified atrial flutter: Secondary | ICD-10-CM | POA: Diagnosis present

## 2018-07-09 DIAGNOSIS — E785 Hyperlipidemia, unspecified: Secondary | ICD-10-CM | POA: Diagnosis not present

## 2018-07-09 HISTORY — PX: A-FLUTTER ABLATION: EP1230

## 2018-07-09 LAB — CBC
HCT: 39.7 % (ref 39.0–52.0)
Hemoglobin: 13 g/dL (ref 13.0–17.0)
MCH: 29.1 pg (ref 26.0–34.0)
MCHC: 32.7 g/dL (ref 30.0–36.0)
MCV: 88.8 fL (ref 80.0–100.0)
Platelets: 136 10*3/uL — ABNORMAL LOW (ref 150–400)
RBC: 4.47 MIL/uL (ref 4.22–5.81)
RDW: 15.5 % (ref 11.5–15.5)
WBC: 4.2 10*3/uL (ref 4.0–10.5)
nRBC: 0 % (ref 0.0–0.2)

## 2018-07-09 LAB — GLUCOSE, CAPILLARY: Glucose-Capillary: 136 mg/dL — ABNORMAL HIGH (ref 70–99)

## 2018-07-09 SURGERY — A-FLUTTER ABLATION

## 2018-07-09 MED ORDER — MIDAZOLAM HCL 5 MG/5ML IJ SOLN
INTRAMUSCULAR | Status: AC
  Filled 2018-07-09: qty 5

## 2018-07-09 MED ORDER — FENTANYL CITRATE (PF) 100 MCG/2ML IJ SOLN
INTRAMUSCULAR | Status: AC
  Filled 2018-07-09: qty 2

## 2018-07-09 MED ORDER — HEPARIN (PORCINE) IN NACL 1000-0.9 UT/500ML-% IV SOLN
INTRAVENOUS | Status: AC
  Filled 2018-07-09: qty 500

## 2018-07-09 MED ORDER — BUPIVACAINE HCL (PF) 0.25 % IJ SOLN
INTRAMUSCULAR | Status: AC
  Filled 2018-07-09: qty 60

## 2018-07-09 MED ORDER — BUPIVACAINE HCL (PF) 0.25 % IJ SOLN
INTRAMUSCULAR | Status: DC | PRN
  Administered 2018-07-09: 60 mL

## 2018-07-09 MED ORDER — MIDAZOLAM HCL 5 MG/5ML IJ SOLN
INTRAMUSCULAR | Status: DC | PRN
  Administered 2018-07-09 (×3): 1 mg via INTRAVENOUS
  Administered 2018-07-09 (×8): 2 mg via INTRAVENOUS

## 2018-07-09 MED ORDER — HEPARIN (PORCINE) IN NACL 1000-0.9 UT/500ML-% IV SOLN
INTRAVENOUS | Status: DC | PRN
  Administered 2018-07-09: 500 mL

## 2018-07-09 MED ORDER — HEPARIN SODIUM (PORCINE) 1000 UNIT/ML IJ SOLN
INTRAMUSCULAR | Status: AC
  Filled 2018-07-09: qty 1

## 2018-07-09 MED ORDER — SODIUM CHLORIDE 0.9 % IV SOLN
INTRAVENOUS | Status: DC
  Administered 2018-07-09: 09:00:00 via INTRAVENOUS

## 2018-07-09 MED ORDER — HEPARIN SODIUM (PORCINE) 1000 UNIT/ML IJ SOLN
INTRAMUSCULAR | Status: DC | PRN
  Administered 2018-07-09: 1000 [IU] via INTRAVENOUS

## 2018-07-09 MED ORDER — FENTANYL CITRATE (PF) 100 MCG/2ML IJ SOLN
INTRAMUSCULAR | Status: DC | PRN
  Administered 2018-07-09 (×2): 25 ug via INTRAVENOUS
  Administered 2018-07-09: 12.5 ug via INTRAVENOUS
  Administered 2018-07-09 (×6): 25 ug via INTRAVENOUS

## 2018-07-09 SURGICAL SUPPLY — 13 items
BAG SNAP BAND KOVER 36X36 (MISCELLANEOUS) ×2 IMPLANT
CATH JOSEPH QUAD ALLRED 6F REP (CATHETERS) ×2 IMPLANT
CATH SMTCH THERMOCOOL SF FJ (CATHETERS) ×2 IMPLANT
CATH WEBSTER BI DIR CS D-F CRV (CATHETERS) ×2 IMPLANT
PACK EP LATEX FREE (CUSTOM PROCEDURE TRAY) ×2
PACK EP LF (CUSTOM PROCEDURE TRAY) ×1 IMPLANT
PAD PRO RADIOLUCENT 2001M-C (PAD) ×3 IMPLANT
PATCH CARTO3 (PAD) ×2 IMPLANT
SHEATH PINNACLE 6F 10CM (SHEATH) ×2 IMPLANT
SHEATH PINNACLE 7F 10CM (SHEATH) ×2 IMPLANT
SHEATH PINNACLE 8F 10CM (SHEATH) ×2 IMPLANT
SHIELD RADPAD SCOOP 12X17 (MISCELLANEOUS) ×2 IMPLANT
TUBING SMART ABLATE COOLFLOW (TUBING) ×4 IMPLANT

## 2018-07-09 NOTE — Interval H&P Note (Signed)
History and Physical Interval Note:  07/09/2018 10:49 AM  Ethan Pitts  has presented today for surgery, with the diagnosis of aflutter.  The various methods of treatment have been discussed with the patient and family. After consideration of risks, benefits and other options for treatment, the patient has consented to  Procedure(s): A-FLUTTER ABLATION (N/A) as a surgical intervention.  The patient's history has been reviewed, patient examined, no change in status, stable for surgery.  I have reviewed the patient's chart and labs.  Questions were answered to the patient's satisfaction.     [name]

## 2018-07-09 NOTE — Progress Notes (Signed)
Up and walked and tolerated well; right groin stable, no bleeding or hematoma 

## 2018-07-09 NOTE — Progress Notes (Signed)
Right femoral sheaths discontinued X 3.  Manual pressure held X 20 minutes. Right groin soft, without hematoma or bleeding.   Pt instructed about groin sign and symptoms of rebleed.  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  Transported to short stay.  Tolerated post procedure sheath pull well.

## 2018-07-09 NOTE — Discharge Instructions (Signed)
Moderate Conscious Sedation, Adult, Care After °These instructions provide you with information about caring for yourself after your procedure. Your health care provider may also give you more specific instructions. Your treatment has been planned according to current medical practices, but problems sometimes occur. Call your health care provider if you have any problems or questions after your procedure. °What can I expect after the procedure? °After your procedure, it is common: °· To feel sleepy for several hours. °· To feel clumsy and have poor balance for several hours. °· To have poor judgment for several hours. °· To vomit if you eat too soon. °Follow these instructions at home: °For at least 24 hours after the procedure: ° °· Do not: °? Participate in activities where you could fall or become injured. °? Drive. °? Use heavy machinery. °? Drink alcohol. °? Take sleeping pills or medicines that cause drowsiness. °? Make important decisions or sign legal documents. °? Take care of children on your own. °· Rest. °Eating and drinking °· Follow the diet recommended by your health care provider. °· If you vomit: °? Drink water, juice, or soup when you can drink without vomiting. °? Make sure you have little or no nausea before eating solid foods. °General instructions °· Have a responsible adult stay with you until you are awake and alert. °· Take over-the-counter and prescription medicines only as told by your health care provider. °· If you smoke, do not smoke without supervision. °· Keep all follow-up visits as told by your health care provider. This is important. °Contact a health care provider if: °· You keep feeling nauseous or you keep vomiting. °· You feel light-headed. °· You develop a rash. °· You have a fever. °Get help right away if: °· You have trouble breathing. °This information is not intended to replace advice given to you by your health care provider. Make sure you discuss any questions you have  with your health care provider. °Document Released: 10/09/2012 Document Revised: 12/01/2016 Document Reviewed: 04/10/2015 °Elsevier Patient Education © 2020 Elsevier Inc. °Femoral Site Care °This sheet gives you information about how to care for yourself after your procedure. Your health care provider may also give you more specific instructions. If you have problems or questions, contact your health care provider. °What can I expect after the procedure? °After the procedure, it is common to have: °· Bruising that usually fades within 1-2 weeks. °· Tenderness at the site. °Follow these instructions at home: °Wound care °· Follow instructions from your health care provider about how to take care of your insertion site. Make sure you: °? Wash your hands with soap and water before you change your bandage (dressing). If soap and water are not available, use hand sanitizer. °? Change your dressing as told by your health care provider. °? Leave stitches (sutures), skin glue, or adhesive strips in place. These skin closures may need to stay in place for 2 weeks or longer. If adhesive strip edges start to loosen and curl up, you may trim the loose edges. Do not remove adhesive strips completely unless your health care provider tells you to do that. °· Do not take baths, swim, or use a hot tub until your health care provider approves. °· You may shower 24-48 hours after the procedure or as told by your health care provider. °? Gently wash the site with plain soap and water. °? Pat the area dry with a clean towel. °? Do not rub the site. This may cause bleeding. °·   may cause bleeding.  Do not apply powder or lotion to the site. Keep the site clean and dry.  Check your femoral site every day for signs of infection. Check for: ? Redness, swelling, or pain. ? Fluid or blood. ? Warmth. ? Pus or a bad smell. Activity  For the first 2-3 days after your procedure, or as long as directed: ? Avoid climbing stairs as much as possible. ? Do  not squat.  Do not lift anything that is heavier than 10 lb (4.5 kg), or the limit that you are told, until your health care provider says that it is safe.  Rest as directed. ? Avoid sitting for a long time without moving. Get up to take short walks every 1-2 hours.  Do not drive for 24 hours if you were given a medicine to help you relax (sedative). General instructions  Take over-the-counter and prescription medicines only as told by your health care provider.  Keep all follow-up visits as told by your health care provider. This is important. Contact a health care provider if you have:  A fever or chills.  You have redness, swelling, or pain around your insertion site. Get help right away if:  The catheter insertion area swells very fast.  You pass out.  You suddenly start to sweat or your skin gets clammy.  The catheter insertion area is bleeding, and the bleeding does not stop when you hold steady pressure on the area.  The area near or just beyond the catheter insertion site becomes pale, cool, tingly, or numb. These symptoms may represent a serious problem that is an emergency. Do not wait to see if the symptoms will go away. Get medical help right away. Call your local emergency services (911 in the [place] Do not drive yourself to the hospital. Summary  After the procedure, it is common to have bruising that usually fades within 1-2 weeks.  Check your femoral site every day for signs of infection.  Do not lift anything that is heavier than 10 lb (4.5 kg), or the limit that you are told, until your health care provider says that it is safe. This information is not intended to replace advice given to you by your health care provider. Make sure you discuss any questions you have with your health care provider. Document Released: 08/22/2013 Document Revised: 01/01/2017 Document Reviewed: 01/01/2017 Elsevier Patient Education  2020 [place] procedure care  instructions No driving for 4 days. No lifting over 5 lbs for 1 week. No vigorous or sexual activity for 1 week. You may return to work on 07/16/2018. Keep procedure site clean & dry. If you notice increased pain, swelling, bleeding or pus, call/return!  You may shower, but no soaking baths/hot tubs/pools for 1 week.

## 2018-07-10 ENCOUNTER — Encounter (HOSPITAL_COMMUNITY): Payer: Self-pay | Admitting: Internal Medicine

## 2018-07-11 ENCOUNTER — Other Ambulatory Visit: Payer: Self-pay | Admitting: Family Medicine

## 2018-07-15 ENCOUNTER — Other Ambulatory Visit: Payer: Self-pay | Admitting: Family Medicine

## 2018-07-15 NOTE — Telephone Encounter (Signed)
RF request for Zaleplon 5mg  LOV: 06/05/18  Next ov: advised to f/u October Last written: 09/07/17 (90,1) No CSC or UDS for this.  PMP aware printed. Medication pending. Please advise, thanks.

## 2018-07-16 ENCOUNTER — Telehealth: Payer: Self-pay | Admitting: Internal Medicine

## 2018-07-16 NOTE — Telephone Encounter (Signed)
FMLA form placed in box for Dr. Lovena Le to review. 07/16/18 vlm

## 2018-07-23 ENCOUNTER — Other Ambulatory Visit: Payer: Self-pay | Admitting: Family Medicine

## 2018-07-25 LAB — HM DIABETES EYE EXAM

## 2018-07-26 ENCOUNTER — Encounter: Payer: Self-pay | Admitting: Family Medicine

## 2018-07-27 ENCOUNTER — Encounter: Payer: Self-pay | Admitting: Family Medicine

## 2018-07-30 ENCOUNTER — Other Ambulatory Visit: Payer: Self-pay | Admitting: Family Medicine

## 2018-07-30 NOTE — Telephone Encounter (Signed)
RF request for Lyrica LOV: 06/05/18 Next ov: not scheduled  Last written: 06/27/18  Please advise

## 2018-08-07 ENCOUNTER — Other Ambulatory Visit: Payer: Self-pay

## 2018-08-07 ENCOUNTER — Encounter: Payer: Self-pay | Admitting: Internal Medicine

## 2018-08-07 ENCOUNTER — Ambulatory Visit (INDEPENDENT_AMBULATORY_CARE_PROVIDER_SITE_OTHER): Payer: Commercial Managed Care - PPO | Admitting: Internal Medicine

## 2018-08-07 VITALS — BP 134/82 | HR 73 | Ht 73.0 in | Wt 301.0 lb

## 2018-08-07 DIAGNOSIS — I10 Essential (primary) hypertension: Secondary | ICD-10-CM | POA: Diagnosis not present

## 2018-08-07 DIAGNOSIS — I4892 Unspecified atrial flutter: Secondary | ICD-10-CM | POA: Diagnosis not present

## 2018-08-07 NOTE — Progress Notes (Signed)
HPI Ethan Pitts returns today after undergoing EP study and catheter ablation of atrial flutter several weeks ago. He has done well in the interim. He denies chest pain or sob. No syncope. He has not had palpitations.  No Known Allergies   Current Outpatient Medications  Medication Sig Dispense Refill  . ALPRAZolam (XANAX) 0.5 MG tablet TAKE 1 TABLET BY MOUTH EVERY 8 HOURS AS NEEDED FOR SLEEP OR ANXIETY (Patient taking differently: Take 0.5 mg by mouth every 8 (eight) hours as needed for anxiety or sleep. TAKE 1 TABLET BY MOUTH EVERY 8 HOURS AS NEEDED FOR SLEEP OR ANXIET) 90 tablet 5  . amLODipine (NORVASC) 10 MG tablet Take 1 tablet (10 mg total) by mouth daily. 30 tablet 3  . benazepril (LOTENSIN) 40 MG tablet Take 1 tablet (40 mg total) by mouth daily. 90 tablet 0  . furosemide (LASIX) 40 MG tablet Take 1 tablet (40 mg total) by mouth daily. 30 tablet 5  . glipiZIDE (GLUCOTROL XL) 10 MG 24 hr tablet Take 1 tablet (10 mg total) by mouth daily with breakfast. 30 tablet 4  . hydrochlorothiazide (MICROZIDE) 12.5 MG capsule Take 1 capsule (12.5 mg total) by mouth daily. 30 capsule 3  . Insulin Pen Needle 31G X 5 MM MISC Use with Victoza as directed 100 each 11  . metFORMIN (GLUCOPHAGE) 1000 MG tablet TAKE 1 TABLET BY MOUTH TWICE DAILY WITH A MEAL (Patient taking differently: Take 1,000 mg by mouth 2 (two) times a day. ) 180 tablet 0  . metoprolol tartrate (LOPRESSOR) 100 MG tablet Take 1 tablet (100 mg total) by mouth 2 (two) times daily. 180 tablet 2  . pregabalin (LYRICA) 150 MG capsule Take 1 capsule by mouth twice daily 60 capsule 5  . rivaroxaban (XARELTO) 20 MG TABS tablet Take 1 tablet (20 mg total) by mouth daily with supper. 90 tablet 2  . testosterone cypionate (DEPOTESTOSTERONE CYPIONATE) 200 MG/ML injection 1 ml IM q 14 days (Patient taking differently: Inject 200 mg into the muscle every 14 (fourteen) days. 1 ml IM q 14 days) 10 mL 0  . VICTOZA 18 MG/3ML SOPN INJECT 1.2MG   SUBCUTANEOUSLY ONCE DAILY 6 mL 0  . zaleplon (SONATA) 5 MG capsule TAKE 1 CAPSULE BY MOUTH AT BEDTIME AS NEEDED FOR SLEEP 90 capsule 1   No current facility-administered medications for this visit.      Past Medical History:  Diagnosis Date  . Anxiety   . Arthritis    lower back   . Atrial flutter ([place]) 2020   No cardioversion needed.  Cards->lopressor and anticoag.  Then, ablation done by Dr. Lovena Le 07/2018.  Marland Kitchen Chronic systolic (congestive) heart failure ([place]) 05/2018   DOE->echo and stress test showed EF 40-45%, +LV hypokinesis.  . Depression   . Diabetes mellitus   . False positive stress test 03/2018   Cath showed normal coronaries with EF 50%  . GERD (gastroesophageal reflux disease)   . Hyperlipidemia   . Hypertension   . Hypogonadism, male 05/2015  . Increased prostate specific antigen (PSA) velocity 2019   06/2017 velocity up; recheck 10/2017 back to baseline.  Plan repeat 1 yr.  . Low back pain 2016   Left lumbar radiculopathy, lumbar spondylolisthesis.  Sciatica episode 03/2016.  Ortho getting L spine MRI as of 07/2016.  . Microcytic anemia 12/2015   suspect iron def due to malabsorption.  Hemoccults NEG 01/26/16, ferrous sulfate started.  . Neuropathic pain of left foot   .  OSA on CPAP    cpap settings at 3    ROS:   All systems reviewed and negative except as noted in the HPI.   Past Surgical History:  Procedure Laterality Date  . A-FLUTTER ABLATION N/A 07/09/2018   Procedure: A-FLUTTER ABLATION;  Surgeon: Evans Lance, MD;  Location: [facility];  Service: Cardiovascular;  Laterality: N/A;  . ADENOIDECTOMY    . BREATH TEK H PYLORI N/A 01/08/2013   Procedure: [place];  Surgeon: [name], MD;  Location: [facility];  Service: General;  Laterality: N/A;  . CARDIAC CATHETERIZATION  03/2018   No signi obstructive CAD (suspected FALSE POSITIVE STRESS TEST).  EF 50%.  Diastolic dysfunction.  Marland Kitchen CARDIOVASCULAR STRESS TEST  03/2018    Intermediate risk; EF 40%, medium sized defect with peri-infarct ischemia-->follow up cath showed no signif obstructive CAD.  Marland Kitchen COLONOSCOPY  04/17/2014   Benign polyp x 1.  Recall 10 yrs.  Marland Kitchen GASTRIC ROUX-EN-Y N/A 04/14/2013   Procedure: LAPAROSCOPIC ROUX-EN-Y GASTRIC BYPASS WITH UPPER ENDOSCOPY;  Surgeon: [name], MD;  Location: [facility];  Service: General;  Laterality: N/A;  . LEFT HEART CATH AND CORONARY ANGIOGRAPHY N/A 03/22/2018   NO CAD.  EF 50%. Procedure: LEFT HEART CATH AND CORONARY ANGIOGRAPHY;  Surgeon: Belva Crome, MD;  Location: [facility];  Service: Cardiovascular;  Laterality: N/A;  . TONSILLECTOMY    . TRANSTHORACIC ECHOCARDIOGRAM  05/13/2018   severe hypokinesis of LV inferior and inferolateral walls, EF 40-45%, correlates with stress test.     Family History  Problem Relation Age of Onset  . Heart disease Father        valvular disease  . COPD Father   . Prostate cancer Father   . Arrhythmia Father   . Colon cancer Neg Hx      Social History   Socioeconomic History  . Marital status: Divorced    Spouse name: Not on file  . Number of children: Not on file  . Years of education: Not on file  . Highest education level: Not on file  Occupational History  . Occupation: Paramedic-EMT    Employer: [employer]  . Financial resource strain: Not on file  . Food insecurity    Worry: Not on file    Inability: Not on file  . Transportation needs    Medical: Not on file    Non-medical: Not on file  Tobacco Use  . Smoking status: Never Smoker  . Smokeless tobacco: Former Systems developer    Types: Chew  Substance and Sexual Activity  . Alcohol use: Yes    Alcohol/week: 12.0 standard drinks    Types: 12 Cans of beer per week    Comment:  12pk/per week  . Drug use: No  . Sexual activity: Not on file  Lifestyle  . Physical activity    Days per week: Not on file    Minutes per session: Not on file  . Stress: Not on file   Relationships  . Social Herbalist on phone: Not on file    Gets together: Not on file    Attends religious service: Not on file    Active member of club or organization: Not on file    Attends meetings of clubs or organizations: Not on file    Relationship status: Not on file  . Intimate partner violence    Fear of current or ex partner: Not  on file    Emotionally abused: Not on file    Physically abused: Not on file    Forced sexual activity: Not on file  Other Topics Concern  . Not on file  Social History Narrative   Married, no children.   Occupation: paramedic     BP 134/82   Pulse 73   Ht 6\' 1"  (1.854 m)   Wt (!) 301 lb (136.5 kg)   SpO2 94%   BMI 39.71 kg/m   Physical Exam:  Well appearing NAD HEENT: Unremarkable Neck:  No JVD, no thyromegally Lymphatics:  No adenopathy Back:  No CVA tenderness Lungs:  Clear HEART:  Regular rate rhythm, no murmurs, no rubs, no clicks Abd:  soft, positive bowel sounds, no organomegally, no rebound, no guarding Ext:  2 plus pulses, no edema, no cyanosis, no clubbing Skin:  No rashes no nodules Neuro:  CN II through XII intact, motor grossly intact  EKG - nsr with IVCD  Assess/Plan: 1. Atrial flutter - the patient is s/p ablation and is doing well with no recurrent atrial flutter. He will stop his xarelto. He will undergo watchful waiting. 2. Obesity - I strongly encouraged the patient to lose weight.  3. HTN - his bp is reasonably well controlled.  4. LV dysfunction - I suspect that this was tachy mediated but I will defer decision to repeat echo to Dr. London Sheer.  Mikle Bosworth.D.

## 2018-08-07 NOTE — Patient Instructions (Addendum)
Medication Instructions:  Your physician has recommended you make the following change in your medication:   1.  Stop taking XARELTO   Labwork: None ordered.  Testing/Procedures: None ordered.  Follow-Up: Your physician wants you to follow-up in: as needed with Dr. Lovena Le.      Any Other Special Instructions Will Be Listed Below (If Applicable).  If you need a refill on your cardiac medications before your next appointment, please call your pharmacy.

## 2018-08-09 ENCOUNTER — Other Ambulatory Visit: Payer: Self-pay | Admitting: Family Medicine

## 2018-08-20 NOTE — Progress Notes (Signed)
HPI: Follow-up atrial flutter and cardiomyopathy.  Patient diagnosed with atrial flutter February 2020. LV function felt to be reduced on technically difficult echo 3/20. Cath 3/20 showed EF 50 60 D1 and otherwise normal coronaries. FU echo 5/20 showed EF 40-45. Had atrial flutter ablation 7/20. Since last seen, the patient has dyspnea with more extreme activities but not with routine activities. It is relieved with rest. It is not associated with chest pain. There is no orthopnea, PND or pedal edema. There is no syncope or palpitations. There is no exertional chest pain.   Current Outpatient Medications  Medication Sig Dispense Refill  . ALPRAZolam (XANAX) 0.5 MG tablet TAKE 1 TABLET BY MOUTH EVERY 8 HOURS AS NEEDED FOR SLEEP OR ANXIETY (Patient taking differently: Take 0.5 mg by mouth every 8 (eight) hours as needed for anxiety or sleep. TAKE 1 TABLET BY MOUTH EVERY 8 HOURS AS NEEDED FOR SLEEP OR ANXIET) 90 tablet 5  . amLODipine (NORVASC) 10 MG tablet Take 1 tablet (10 mg total) by mouth daily. 30 tablet 3  . benazepril (LOTENSIN) 40 MG tablet Take 1 tablet (40 mg total) by mouth daily. 90 tablet 0  . furosemide (LASIX) 40 MG tablet Take 1 tablet (40 mg total) by mouth daily. 30 tablet 5  . glipiZIDE (GLUCOTROL XL) 10 MG 24 hr tablet Take 1 tablet (10 mg total) by mouth daily with breakfast. 30 tablet 4  . hydrochlorothiazide (MICROZIDE) 12.5 MG capsule Take 1 capsule (12.5 mg total) by mouth daily. 30 capsule 3  . Insulin Pen Needle 31G X 5 MM MISC Use with Victoza as directed 100 each 11  . metFORMIN (GLUCOPHAGE) 1000 MG tablet TAKE 1 TABLET BY MOUTH TWICE DAILY WITH A MEAL 180 tablet 0  . metoprolol tartrate (LOPRESSOR) 100 MG tablet Take 1 tablet (100 mg total) by mouth 2 (two) times daily. 180 tablet 2  . pregabalin (LYRICA) 150 MG capsule Take 1 capsule by mouth twice daily 60 capsule 5  . testosterone cypionate (DEPOTESTOSTERONE CYPIONATE) 200 MG/ML injection 1 ml IM q 14 days  (Patient taking differently: Inject 200 mg into the muscle every 14 (fourteen) days. 1 ml IM q 14 days) 10 mL 0  . VICTOZA 18 MG/3ML SOPN INJECT 1.2MG  SUBCUTANEOUSLY ONCE DAILY 6 mL 0  . zaleplon (SONATA) 5 MG capsule TAKE 1 CAPSULE BY MOUTH AT BEDTIME AS NEEDED FOR SLEEP 90 capsule 1   No current facility-administered medications for this visit.      Past Medical History:  Diagnosis Date  . Anxiety   . Arthritis    lower back   . Atrial flutter ([place]) 2020   No cardioversion needed.  Cards->lopressor and anticoag.  Then, ablation done by Dr. Lovena Le 07/2018.  Marland Kitchen Chronic systolic (congestive) heart failure ([place]) 05/2018   DOE->echo and stress test showed EF 40-45%, +LV hypokinesis.  . Depression   . Diabetes mellitus   . False positive stress test 03/2018   Cath showed normal coronaries with EF 50%  . GERD (gastroesophageal reflux disease)   . Hyperlipidemia   . Hypertension   . Hypogonadism, male 05/2015  . Increased prostate specific antigen (PSA) velocity 2019   06/2017 velocity up; recheck 10/2017 back to baseline.  Plan repeat 1 yr.  . Low back pain 2016   Left lumbar radiculopathy, lumbar spondylolisthesis.  Sciatica episode 03/2016.  Ortho getting L spine MRI as of 07/2016.  . Microcytic anemia 12/2015   suspect iron def due to malabsorption.  Hemoccults NEG 01/26/16, ferrous sulfate started.  . Neuropathic pain of left foot   . OSA on CPAP    cpap settings at 3    Past Surgical History:  Procedure Laterality Date  . A-FLUTTER ABLATION N/A 07/09/2018   Procedure: A-FLUTTER ABLATION;  Surgeon: Evans Lance, MD;  Location: [facility];  Service: Cardiovascular;  Laterality: N/A;  . ADENOIDECTOMY    . BREATH TEK H PYLORI N/A 01/08/2013   Procedure: [place];  Surgeon: [name], MD;  Location: [facility];  Service: General;  Laterality: N/A;  . CARDIAC CATHETERIZATION  03/2018   No signi obstructive CAD (suspected FALSE POSITIVE STRESS TEST).  EF 50%.   Diastolic dysfunction.  Marland Kitchen CARDIOVASCULAR STRESS TEST  03/2018   Intermediate risk; EF 40%, medium sized defect with peri-infarct ischemia-->follow up cath showed no signif obstructive CAD.  Marland Kitchen COLONOSCOPY  04/17/2014   Benign polyp x 1.  Recall 10 yrs.  Marland Kitchen GASTRIC ROUX-EN-Y N/A 04/14/2013   Procedure: LAPAROSCOPIC ROUX-EN-Y GASTRIC BYPASS WITH UPPER ENDOSCOPY;  Surgeon: [name], MD;  [facility];  Service: General;  Laterality: N/A;  . LEFT HEART CATH AND CORONARY ANGIOGRAPHY N/A 03/22/2018   NO CAD.  EF 50%. Procedure: LEFT HEART CATH AND CORONARY ANGIOGRAPHY;  Surgeon: Belva Crome, MD;  Location: [facility];  Service: Cardiovascular;  Laterality: N/A;  . TONSILLECTOMY    . TRANSTHORACIC ECHOCARDIOGRAM  05/13/2018   severe hypokinesis of LV inferior and inferolateral walls, EF 40-45%, correlates with stress test.    Social History   Socioeconomic History  . Marital status: Divorced    Spouse name: Not on file  . Number of children: Not on file  . Years of education: Not on file  . Highest education level: Not on file  Occupational History  . Occupation: Paramedic-EMT    Employer: [employer]  . Financial resource strain: Not on file  . Food insecurity    Worry: Not on file    Inability: Not on file  . Transportation needs    Medical: Not on file    Non-medical: Not on file  Tobacco Use  . Smoking status: Never Smoker  . Smokeless tobacco: Former Systems developer    Types: Chew  Substance and Sexual Activity  . Alcohol use: Yes    Alcohol/week: 12.0 standard drinks    Types: 12 Cans of beer per week    Comment:  12pk/per week  . Drug use: No  . Sexual activity: Not on file  Lifestyle  . Physical activity    Days per week: Not on file    Minutes per session: Not on file  . Stress: Not on file  Relationships  . Social Herbalist on phone: Not on file    Gets together: Not on file    Attends religious service: Not on file     Active member of club or organization: Not on file    Attends meetings of clubs or organizations: Not on file    Relationship status: Not on file  . Intimate partner violence    Fear of current or ex partner: Not on file    Emotionally abused: Not on file    Physically abused: Not on file    Forced sexual activity: Not on file  Other Topics Concern  . Not on file  Social History Narrative   Married, no children.   Occupation: paramedic  Family History  Problem Relation Age of Onset  . Heart disease Father        valvular disease  . COPD Father   . Prostate cancer Father   . Arrhythmia Father   . Colon cancer Neg Hx     ROS: no fevers or chills, productive cough, hemoptysis, dysphasia, odynophagia, melena, hematochezia, dysuria, hematuria, rash, seizure activity, orthopnea, PND, pedal edema, claudication. Remaining systems are negative.  Physical Exam: Well-developed obese in no acute distress.  Skin is warm and dry.  HEENT is normal.  Neck is supple.  Chest is clear to auscultation with normal expansion.  Cardiovascular exam is regular rate and rhythm.  Abdominal exam nontender or distended. No masses palpated. Extremities show trace edema. neuro grossly intact  ECG-sinus rhythm with nonspecific ST changes.  Personally reviewed  A/P  1 Atrial flutter-s/p ablation. Remains in sinus.  2 NICM-likely tachycardia mediated; continue metoprolol and lotensin; repeat echo in 3-4 months to see if LV function improved.  3 CAD-add ASA and lipitor 40 mg daily; check lipids and liver in 8 weeks.  4 hypertension-BP controlled; continue present meds.  5 morbid obesity-we discussed diet and weight loss.  Kirk Ruths, MD

## 2018-08-26 ENCOUNTER — Encounter: Payer: Self-pay | Admitting: Cardiology

## 2018-08-26 ENCOUNTER — Ambulatory Visit (INDEPENDENT_AMBULATORY_CARE_PROVIDER_SITE_OTHER): Payer: Commercial Managed Care - PPO | Admitting: Cardiology

## 2018-08-26 ENCOUNTER — Other Ambulatory Visit: Payer: Self-pay | Admitting: Family Medicine

## 2018-08-26 ENCOUNTER — Other Ambulatory Visit: Payer: Self-pay

## 2018-08-26 VITALS — BP 114/72 | HR 77 | Temp 97.7°F | Ht 73.0 in | Wt 296.0 lb

## 2018-08-26 DIAGNOSIS — I428 Other cardiomyopathies: Secondary | ICD-10-CM | POA: Diagnosis not present

## 2018-08-26 DIAGNOSIS — I4892 Unspecified atrial flutter: Secondary | ICD-10-CM | POA: Diagnosis not present

## 2018-08-26 DIAGNOSIS — E7800 Pure hypercholesterolemia, unspecified: Secondary | ICD-10-CM | POA: Diagnosis not present

## 2018-08-26 DIAGNOSIS — I10 Essential (primary) hypertension: Secondary | ICD-10-CM

## 2018-08-26 MED ORDER — ATORVASTATIN CALCIUM 40 MG PO TABS: 40.0000 mg | ORAL_TABLET | Freq: Every day | ORAL | 3 refills | Status: DC

## 2018-08-26 NOTE — Patient Instructions (Signed)
Medication Instructions:  START ATORVASTATIN 40 MG ONCE DAILY  If you need a refill on your cardiac medications before your next appointment, please call your pharmacy.   Lab work: Your physician recommends that you return for lab work in: [place]  If you have labs (blood work) drawn today and your tests are completely normal, you will receive your results only by: Marland Kitchen MyChart Message (if you have MyChart) OR . A paper copy in the mail If you have any lab test that is abnormal or we need to change your treatment, we will call you to review the results.  Testing/Procedures: Your physician has requested that you have an echocardiogram. Echocardiography is a painless test that uses sound waves to create images of your heart. It provides your doctor with information about the size and shape of your heart and how well your heart's chambers and valves are working. This procedure takes approximately one hour. There are no restrictions for this procedure.SCHEDULE IN [place]    Follow-Up: At [facility], you and your health needs are our priority.  As part of our continuing mission to provide you with exceptional heart care, we have created designated Provider Care Teams.  These Care Teams include your primary Cardiologist (physician) and Advanced Practice Providers (APPs -  Physician Assistants and Nurse Practitioners) who all work together to provide you with the care you need, when you need it. You will need a follow up appointment in 12 months.  Please call our office 2 months in advance to schedule this appointment.  You may see Kirk Ruths MD or one of the following Advanced Practice Providers on your designated Care Team:   Kerin Ransom, PA-C Roby Lofts, [place] . Sande Rives, PA-C

## 2018-09-01 ENCOUNTER — Encounter: Payer: Self-pay | Admitting: Family Medicine

## 2018-09-20 ENCOUNTER — Other Ambulatory Visit: Payer: Self-pay | Admitting: Family Medicine

## 2018-10-10 ENCOUNTER — Other Ambulatory Visit: Payer: Self-pay | Admitting: Family Medicine

## 2018-10-24 ENCOUNTER — Other Ambulatory Visit: Payer: Self-pay | Admitting: Family Medicine

## 2018-11-18 ENCOUNTER — Other Ambulatory Visit: Payer: Self-pay | Admitting: Family Medicine

## 2018-11-23 ENCOUNTER — Other Ambulatory Visit: Payer: Self-pay | Admitting: Family Medicine

## 2018-11-24 ENCOUNTER — Other Ambulatory Visit: Payer: Self-pay | Admitting: Family Medicine

## 2018-12-03 ENCOUNTER — Ambulatory Visit (HOSPITAL_COMMUNITY): Payer: Commercial Managed Care - PPO | Attending: Cardiovascular Disease

## 2018-12-03 ENCOUNTER — Other Ambulatory Visit: Payer: Self-pay

## 2018-12-03 DIAGNOSIS — I428 Other cardiomyopathies: Secondary | ICD-10-CM | POA: Diagnosis present

## 2018-12-03 MED ORDER — PERFLUTREN LIPID MICROSPHERE
1.0000 mL | INTRAVENOUS | Status: AC | PRN
  Administered 2018-12-03: 2 mL via INTRAVENOUS

## 2018-12-04 ENCOUNTER — Encounter: Payer: Self-pay | Admitting: Cardiology

## 2018-12-04 ENCOUNTER — Ambulatory Visit (INDEPENDENT_AMBULATORY_CARE_PROVIDER_SITE_OTHER): Payer: Commercial Managed Care - PPO | Admitting: Cardiology

## 2018-12-04 DIAGNOSIS — I2510 Atherosclerotic heart disease of native coronary artery without angina pectoris: Secondary | ICD-10-CM | POA: Insufficient documentation

## 2018-12-04 DIAGNOSIS — I4892 Unspecified atrial flutter: Secondary | ICD-10-CM | POA: Diagnosis not present

## 2018-12-04 DIAGNOSIS — Z794 Long term (current) use of insulin: Secondary | ICD-10-CM

## 2018-12-04 DIAGNOSIS — I10 Essential (primary) hypertension: Secondary | ICD-10-CM | POA: Diagnosis not present

## 2018-12-04 DIAGNOSIS — I428 Other cardiomyopathies: Secondary | ICD-10-CM | POA: Diagnosis not present

## 2018-12-04 DIAGNOSIS — E119 Type 2 diabetes mellitus without complications: Secondary | ICD-10-CM

## 2018-12-04 DIAGNOSIS — G4733 Obstructive sleep apnea (adult) (pediatric): Secondary | ICD-10-CM

## 2018-12-04 NOTE — Assessment & Plan Note (Signed)
EF 40-45% by echo 12/03/2018

## 2018-12-04 NOTE — Assessment & Plan Note (Signed)
Fair control.

## 2018-12-04 NOTE — Assessment & Plan Note (Signed)
60% small Dx1- no other CAD at cath March 2020

## 2018-12-04 NOTE — Patient Instructions (Signed)
Medication Instructions:  Your physician recommends that you continue on your current medications as directed. Please refer to the Current Medication list given to you today. *If you need a refill on your cardiac medications before your next appointment, please call your pharmacy*  Lab Work: None  If you have labs (blood work) drawn today and your tests are completely normal, you will receive your results only by: Marland Kitchen MyChart Message (if you have MyChart) OR . A paper copy in the mail If you have any lab test that is abnormal or we need to change your treatment, we will call you to review the results.  Testing/Procedures: Bryn Gulling- Long Term Monitor Instructions   Your physician has requested you wear your ZIO patch monitor 7 days.   This is a single patch monitor.  Ir[employer] supplies one patch monitor per enrollment.  Additional stickers are not available.   Please do not apply patch if you will be having a Nuclear Stress Test, Echocardiogram, Cardiac CT, MRI, or Chest Xray during the time frame you would be wearing the monitor. The patch cannot be worn during these tests.  You cannot remove and re-apply the ZIO XT patch monitor.   Your ZIO patch monitor will be sent USPS Priority mail from [facility] directly to your home address. The monitor may also be mailed to a PO BOX if home delivery is not available.   It may take 3-5 days to receive your monitor after you have been enrolled.   Once you have received you monitor, please review enclosed instructions.  Your monitor has already been registered assigning a specific monitor serial # to you.   Applying the monitor   Shave hair from upper left chest.   Hold abrader disc by orange tab.  Rub abrader in 40 strokes over left upper chest as indicated in your monitor instructions.   Clean area with 4 enclosed alcohol pads .  Use all pads to assure are is cleaned thoroughly.  Let dry.   Apply patch as indicated in monitor instructions.   Patch will be place under collarbone on left side of chest with arrow pointing upward.   Rub patch adhesive wings for 2 minutes.Remove white label marked "1".  Remove white label marked "2".  Rub patch adhesive wings for 2 additional minutes.   While looking in a mirror, press and release button in center of patch.  A small green light will flash 3-4 times .  This will be your only indicator the monitor has been turned on.     Do not shower for the first 24 hours.  You may shower after the first 24 hours.   Press button if you feel a symptom. You will hear a small click.  Record Date, Time and Symptom in the Patient Log Book.   When you are ready to remove patch, follow instructions on last 2 pages of Patient Log Book.  Stick patch monitor onto last page of Patient Log Book.   Place Patient Log Book in [place] box.  Use locking tab on box and tape box closed securely.  The [employer] and [employer] has [employer] on it.  Please place in mailbox as soon as possible.  Your physician should have your test results approximately 7 days after the monitor has been mailed back to [facility].   Call [place] at [phone] if you have questions regarding your ZIO XT patch monitor.  Call them immediately if you see  an orange light blinking on your monitor.   If your monitor falls off in less than 4 days contact our Monitor department at [phone].  If your monitor becomes loose or falls off after 4 days call [employer] at [phone] for suggestions on securing your monitor.         Follow-Up: At [facility], you and your health needs are our priority.  As part of our continuing mission to provide you with exceptional heart care, we have created designated Provider Care Teams.  These Care Teams include your primary Cardiologist (physician) and Advanced Practice Providers (APPs -  Physician Assistants and Nurse Practitioners) who all work together to provide you  with the care you need, when you need it.  Your next appointment:   3 week(s)  The format for your next appointment:   In Person  Provider:   [facility]  Other Instructions

## 2018-12-04 NOTE — Assessment & Plan Note (Signed)
IDDM 

## 2018-12-04 NOTE — Assessment & Plan Note (Signed)
S/P RFA 07/11/2018

## 2018-12-04 NOTE — Assessment & Plan Note (Signed)
On C-pap 

## 2018-12-04 NOTE — Progress Notes (Signed)
Cardiology Office Note:    Date:  12/04/2018   ID:  Ethan Pitts, DOB [birth year], MRN [id number]  PCP:  Tammi Sou, MD  Cardiologist:  No primary care provider on file.  Electrophysiologist:  None   Referring MD: Tammi Sou, MD   No chief complaint on file.   History of Present Illness:    Ethan Pitts is a 58 y.o. male with a hx of PAF, status post atrial flutter ablation 07/11/2018, morbid obesity, sleep apnea on CPAP, insulin-dependent diabetes, treated hypertension, mild coronary disease with a small 60% first diagonal narrowing at catheterization March 2020, and a nonischemic cardiomyopathy with an EF of 40 to 45%.  Patient is in the office today after a follow-up echocardiogram done 12/03/2018 showed an EF of 40 to 45%.  The result was read as "possibly secondary to atrial flutter".  He was given an office visit today.  His EKG shows sinus rhythm.  He has a monitor on his wrist.  Monitor at home shows heart rates of 100 205 but they appear to be regular.  In some views there is P waves in some not, I am unable to tell whether this was atrial fibrillation or sinus rhythm. He does not have symptoms of AF- usually he has DOE.   Past Medical History:  Diagnosis Date   Anxiety    Arthritis    lower back    Atrial flutter ([place]) 2020   Cards->lopressor and anticoag, ablation. In sinus still as of 08/2018 cards f/u.   CAD (coronary artery disease) 03/2018   Ostial first diagonal 60% narrowed, otherwise normal coronaries. Dr. Stanford Breed added ASA and statin 08/2018.   Depression    Diabetes mellitus    False positive stress test 03/2018   Cath showed normal coronaries with EF 50%   GERD (gastroesophageal reflux disease)    Hyperlipidemia    Hypertension    Hypogonadism, male 05/2015   Increased prostate specific antigen (PSA) velocity 2019   06/2017 velocity up; recheck 10/2017 back to baseline.  Plan repeat 1 yr.   Low back pain 2016   Left  lumbar radiculopathy, lumbar spondylolisthesis.  Sciatica episode 03/2016.  Ortho getting L spine MRI as of 07/2016.   Microcytic anemia 12/2015   suspect iron def due to malabsorption.  Hemoccults NEG 01/26/16, ferrous sulfate started.   Neuropathic pain of left foot    NICM (nonischemic cardiomyopathy) ([place]) 05/2018   EF 40-45%, +LV hypokinesis. Suspected to be tachycardia-mediated->Dr. Stanford Breed to rpt echo fall 2020.   OSA on CPAP    cpap settings at 3    Past Surgical History:  Procedure Laterality Date   A-FLUTTER ABLATION N/A 07/09/2018   Procedure: A-FLUTTER ABLATION;  Surgeon: Evans Lance, MD;  Location: [place];  Service: Cardiovascular;  Laterality: N/A;   ADENOIDECTOMY     BREATH TEK H PYLORI N/A 01/08/2013   Procedure: [place];  Surgeon: [name], MD;  Location: [facility];  Service: General;  Laterality: N/A;   CARDIAC CATHETERIZATION  03/2018   No signi obstructive CAD (suspected FALSE POSITIVE STRESS TEST).  EF 50%.  Diastolic dysfunction.   CARDIOVASCULAR STRESS TEST  03/2018   Intermediate risk; EF 40%, medium sized defect with peri-infarct ischemia-->follow up cath showed no signif obstructive CAD.   COLONOSCOPY  04/17/2014   Benign polyp x 1.  Recall 10 yrs.   GASTRIC ROUX-EN-Y N/A 04/14/2013   Procedure: LAPAROSCOPIC ROUX-EN-Y GASTRIC BYPASS WITH UPPER  ENDOSCOPY;  Surgeon: [name], MD;  Location: [facility];  Service: General;  Laterality: N/A;   LEFT HEART CATH AND CORONARY ANGIOGRAPHY N/A 03/22/2018   NO CAD.  EF 50%. Procedure: LEFT HEART CATH AND CORONARY ANGIOGRAPHY;  Surgeon: Belva Crome, MD;  Location: [place] [facility];  Service: Cardiovascular;  Laterality: N/A;   TONSILLECTOMY     TRANSTHORACIC ECHOCARDIOGRAM  05/13/2018   severe hypokinesis of LV inferior and inferolateral walls, EF 40-45%, correlates with stress test.    Current Medications: Current Meds  Medication Sig   ALPRAZolam (XANAX) 0.5 MG  tablet TAKE 1 TABLET BY MOUTH EVERY 8 HOURS AS NEEDED FOR SLEEP OR ANXIETY (Patient taking differently: Take 0.5 mg by mouth every 8 (eight) hours as needed for anxiety or sleep. TAKE 1 TABLET BY MOUTH EVERY 8 HOURS AS NEEDED FOR SLEEP OR ANXIET)   amLODipine (NORVASC) 10 MG tablet Take 1 tablet by mouth once daily   benazepril (LOTENSIN) 40 MG tablet Take 1 tablet by mouth once daily   furosemide (LASIX) 40 MG tablet Take 1 tablet (40 mg total) by mouth daily.   glipiZIDE (GLUCOTROL XL) 10 MG 24 hr tablet Take 1 tablet (10 mg total) by mouth daily with breakfast. OFFICE VISIT NEEDED   hydrochlorothiazide (MICROZIDE) 12.5 MG capsule Take 1 capsule (12.5 mg total) by mouth daily.   Insulin Pen Needle 31G X 5 MM MISC Use with Victoza as directed   metFORMIN (GLUCOPHAGE) 1000 MG tablet TAKE 1 TABLET BY MOUTH TWICE DAILY WITH A MEAL   metoprolol tartrate (LOPRESSOR) 100 MG tablet Take 1 tablet (100 mg total) by mouth 2 (two) times daily.   pregabalin (LYRICA) 150 MG capsule Take 1 capsule by mouth twice daily   testosterone cypionate (DEPOTESTOSTERONE CYPIONATE) 200 MG/ML injection 1 ml IM q 14 days (Patient taking differently: Inject 200 mg into the muscle every 14 (fourteen) days. 1 ml IM q 14 days)   VICTOZA 18 MG/3ML SOPN INJECT 1.2MG  SUBCUTANEOUSLY ONCE DAILY. OFFICE VISIT NEEDED   zaleplon (SONATA) 5 MG capsule TAKE 1 CAPSULE BY MOUTH AT BEDTIME AS NEEDED FOR SLEEP     Allergies:   Patient has no known allergies.   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[employer]    Employer: [employer] resource strain: Not on file   Food insecurity    Worry: Not on file    Inability: Not on file   Transportation needs    Medical: Not on file     Non-medical: Not on file  Tobacco Use   Smoking status: Never Smoker   Smokeless tobacco: Former Systems developer    Types: Chew  Substance and Sexual Activity   Alcohol use: Yes    Alcohol/week: 12.0 standard drinks    Types: 12 Cans of beer per week    Comment:  12pk/per week   Drug use: No   Sexual activity: Not on file  Lifestyle   Physical activity    Days per week: Not on file    Minutes per session: Not on file   Stress: Not on file  Relationships   Social connections    Talks on phone: Not on file    Gets  together: Not on file    Attends religious service: Not on file    Active member of club or organization: Not on file    Attends meetings of clubs or organizations: Not on file    Relationship status: Not on file  Other Topics Concern   Not on file  Social History Narrative   Married, no children.   Occupation: paramedic     Family History: The patient's family history includes Arrhythmia in his father; COPD in his father; Heart disease in his father; Prostate cancer in his father. There is no history of Colon cancer.  ROS:   Please see the history of present illness.     All other systems reviewed and are negative.  EKGs/Labs/Other Studies Reviewed:    The following studies were reviewed today: Echo 12/03/2018  EKG:  EKG is ordered today.  The ekg ordered today demonstrates NSR- HR 75  Recent Labs: 02/03/2018: ALT 27; B Natriuretic Peptide 334.5; Magnesium 1.6; TSH 0.581 06/13/2018: BUN 13; Creatinine, Ser 0.96; Potassium 4.4; Sodium 137 07/09/2018: Hemoglobin 13.0; Platelets 136  Recent Lipid Panel    Component Value Date/Time   CHOL 142 10/09/2017 0939   TRIG 177.0 (H) 10/09/2017 0939   HDL 35.80 (L) 10/09/2017 0939   CHOLHDL 4 10/09/2017 0939   VLDL 35.4 10/09/2017 0939   LDLCALC 71 10/09/2017 0939   LDLCALC 39 02/23/2017 1636   LDLDIRECT 75.0 06/23/2016 0941    Physical Exam:    VS:  BP (!) 149/93    Pulse 76    Temp (!) 97.2 F (36.2 C)     Ht 6\' 1"  (1.854 m)    Wt (!) 306 lb (138.8 kg)    SpO2 96%    BMI 40.37 kg/m     Wt Readings from Last 3 Encounters:  12/04/18 (!) 306 lb (138.8 kg)  08/26/18 296 lb (134.3 kg)  08/07/18 (!) 301 lb (136.5 kg)     GEN: Morbidly obese male, in no acute distress HEENT: Normal NECK: No JVD; No carotid bruits CARDIAC: RRR, no murmurs, rubs, gallops RESPIRATORY:  Clear to auscultation without rales, wheezing or rhonchi  ABDOMEN: Obese, soft, non-tender, non-distended MUSCULOSKELETAL:  No edema; No deformity  SKIN: Warm and dry NEUROLOGIC:  Alert and oriented x 3 PSYCHIATRIC:  Normal affect   ASSESSMENT:    Atrial flutter (H[facility]) S/P RFA 07/11/2018  NICM (nonischemic cardiomyopathy) ([facility]) EF 40-45% by echo 12/03/2018   CAD (coronary artery disease) 60% small Dx1- no other CAD at cath March 2020  Essential hypertension Fair control  DM type 2 (diabetes mellitus, type 2) IDDM  OBSTRUCTIVE SLEEP APNEA On C-pap  PLAN:    I am unable to tell if he is in AF based on his home watch/ monitor.  His rate is less than 110 at the fastest and he appears to have a regular R-R interval.  P waves are hard to see.  Check a 7 day ZIO and have him f/u with the AF clinic.  I did not start anticoagulation since I don't have any evidence of AF. I will forward to Dr Stanford Breed and Dr Lovena Le.    Medication Adjustments/Labs and Tests Ordered: Current medicines are reviewed at length with the patient today.  Concerns regarding medicines are outlined above.  Orders Placed This Encounter  Procedures   LONG TERM MONITOR (3-14 DAYS)   No orders of the defined types were placed in this encounter.   Patient Instructions  Medication Instructions:  Your physician recommends  that you continue on your current medications as directed. Please refer to the Current Medication list given to you today. *If you need a refill on your cardiac medications before your next appointment, please call your  pharmacy*  Lab Work: None  If you have labs (blood work) drawn today and your tests are completely normal, you will receive your results only by:  [place] (if you have MyChart) OR  A paper copy in the mail If you have any lab test that is abnormal or we need to change your treatment, we will call you to review the results.  Testing/Procedures: Bryn Gulling- Long Term Monitor Instructions   Your physician has requested you wear your ZIO patch monitor 7 days.   This is a single patch monitor.  [employer] supplies one patch monitor per enrollment.  Additional stickers are not available.   Please do not apply patch if you will be having a Nuclear Stress Test, Echocardiogram, Cardiac CT, MRI, or Chest Xray during the time frame you would be wearing the monitor. The patch cannot be worn during these tests.  You cannot remove and re-apply the ZIO XT patch monitor.   Your ZIO patch monitor will be sent USPS Priority mail from [facility] directly to your home address. The monitor may also be mailed to a PO BOX if home delivery is not available.   It may take 3-5 days to receive your monitor after you have been enrolled.   Once you have received you monitor, please review enclosed instructions.  Your monitor has already been registered assigning a specific monitor serial # to you.   Applying the monitor   Shave hair from upper left chest.   Hold abrader disc by orange tab.  Rub abrader in 40 strokes over left upper chest as indicated in your monitor instructions.   Clean area with 4 enclosed alcohol pads .  Use all pads to assure are is cleaned thoroughly.  Let dry.   Apply patch as indicated in monitor instructions.  Patch will be place under collarbone on left side of chest with arrow pointing upward.   Rub patch adhesive wings for 2 minutes.Remove white label marked "1".  Remove white label marked "2".  Rub patch adhesive wings for 2 additional minutes.   While looking in a  mirror, press and release button in center of patch.  A small green light will flash 3-4 times .  This will be your only indicator the monitor has been turned on.     Do not shower for the first 24 hours.  You may shower after the first 24 hours.   Press button if you feel a symptom. You will hear a small click.  Record Date, Time and Symptom in the Patient Log Book.   When you are ready to remove patch, follow instructions on last 2 pages of Patient Log Book.  Stick patch monitor onto last page of Patient Log Book.   Place Patient Log Book in [place] box.  Use locking tab on box and tape box closed securely.  The [employer] has [employer] on it.  Please place in mailbox as soon as possible.  Your physician should have your test results approximately 7 days after the monitor has been mailed back to [facility].   Call [place] at [phone] if you have questions regarding your ZIO XT patch monitor.  Call them immediately if you see an orange light blinking on  your monitor.   If your monitor falls off in less than 4 days contact our Monitor department at [phone].  If your monitor becomes loose or falls off after 4 days call [employer] at [phone] for suggestions on securing your monitor.         Follow-Up: At [facility], you and your health needs are our priority.  As part of our continuing mission to provide you with exceptional heart care, we have created designated Provider Care Teams.  These Care Teams include your primary Cardiologist (physician) and Advanced Practice Providers (APPs -  Physician Assistants and Nurse Practitioners) who all work together to provide you with the care you need, when you need it.  Your next appointment:   3 week(s)  The format for your next appointment:   In Person  Provider:   [facility]  Other Instructions     Signed, Kerin Ransom, Hershal Coria  12/04/2018 3:59 PM    [place]

## 2018-12-05 ENCOUNTER — Telehealth: Payer: Self-pay | Admitting: Radiology

## 2018-12-05 NOTE — Telephone Encounter (Signed)
Enrolled patient for a 7 Day Zio monitor to be mailed to patients home.

## 2018-12-09 ENCOUNTER — Other Ambulatory Visit (INDEPENDENT_AMBULATORY_CARE_PROVIDER_SITE_OTHER): Payer: Commercial Managed Care - PPO

## 2018-12-09 DIAGNOSIS — I2510 Atherosclerotic heart disease of native coronary artery without angina pectoris: Secondary | ICD-10-CM

## 2018-12-09 DIAGNOSIS — I4892 Unspecified atrial flutter: Secondary | ICD-10-CM | POA: Diagnosis not present

## 2018-12-09 DIAGNOSIS — I10 Essential (primary) hypertension: Secondary | ICD-10-CM | POA: Diagnosis not present

## 2018-12-09 DIAGNOSIS — I428 Other cardiomyopathies: Secondary | ICD-10-CM

## 2018-12-16 ENCOUNTER — Other Ambulatory Visit (INDEPENDENT_AMBULATORY_CARE_PROVIDER_SITE_OTHER): Payer: Commercial Managed Care - PPO

## 2018-12-16 DIAGNOSIS — I4891 Unspecified atrial fibrillation: Secondary | ICD-10-CM

## 2018-12-16 DIAGNOSIS — I428 Other cardiomyopathies: Secondary | ICD-10-CM

## 2018-12-17 ENCOUNTER — Other Ambulatory Visit: Payer: Self-pay | Admitting: Family Medicine

## 2018-12-23 ENCOUNTER — Other Ambulatory Visit: Payer: Self-pay | Admitting: Family Medicine

## 2018-12-23 ENCOUNTER — Other Ambulatory Visit (HOSPITAL_COMMUNITY): Payer: Self-pay | Admitting: Physician Assistant

## 2018-12-24 NOTE — Telephone Encounter (Signed)
Spoke with pt, his heart rate has been running above 100 bpm for the last 3 days. His metoprolol is 100 mg bid and he reports feeling fine. He mails his monitor back in today. Will make dr Stanford Breed aware.

## 2018-12-31 ENCOUNTER — Encounter (HOSPITAL_COMMUNITY): Payer: Self-pay | Admitting: Physician Assistant

## 2018-12-31 ENCOUNTER — Other Ambulatory Visit: Payer: Self-pay

## 2018-12-31 ENCOUNTER — Ambulatory Visit (HOSPITAL_COMMUNITY)
Admission: RE | Admit: 2018-12-31 | Discharge: 2018-12-31 | Disposition: A | Discharge status: Home / Self Care | Payer: Commercial Managed Care - PPO | Source: Ambulatory Visit | Attending: Physician Assistant | Admitting: Physician Assistant

## 2018-12-31 VITALS — BP 124/78 | HR 87 | Ht 73.0 in | Wt 308.8 lb

## 2018-12-31 DIAGNOSIS — E785 Hyperlipidemia, unspecified: Secondary | ICD-10-CM | POA: Diagnosis not present

## 2018-12-31 DIAGNOSIS — E119 Type 2 diabetes mellitus without complications: Secondary | ICD-10-CM | POA: Insufficient documentation

## 2018-12-31 DIAGNOSIS — Z87891 Personal history of nicotine dependence: Secondary | ICD-10-CM | POA: Diagnosis not present

## 2018-12-31 DIAGNOSIS — Z9884 Bariatric surgery status: Secondary | ICD-10-CM | POA: Insufficient documentation

## 2018-12-31 DIAGNOSIS — I10 Essential (primary) hypertension: Secondary | ICD-10-CM | POA: Diagnosis not present

## 2018-12-31 DIAGNOSIS — E669 Obesity, unspecified: Secondary | ICD-10-CM | POA: Insufficient documentation

## 2018-12-31 DIAGNOSIS — M5416 Radiculopathy, lumbar region: Secondary | ICD-10-CM | POA: Insufficient documentation

## 2018-12-31 DIAGNOSIS — Z825 Family history of asthma and other chronic lower respiratory diseases: Secondary | ICD-10-CM | POA: Diagnosis not present

## 2018-12-31 DIAGNOSIS — F329 Major depressive disorder, single episode, unspecified: Secondary | ICD-10-CM | POA: Diagnosis not present

## 2018-12-31 DIAGNOSIS — Z8249 Family history of ischemic heart disease and other diseases of the circulatory system: Secondary | ICD-10-CM | POA: Diagnosis not present

## 2018-12-31 DIAGNOSIS — I483 Typical atrial flutter: Secondary | ICD-10-CM | POA: Diagnosis not present

## 2018-12-31 DIAGNOSIS — Z79899 Other long term (current) drug therapy: Secondary | ICD-10-CM | POA: Insufficient documentation

## 2018-12-31 DIAGNOSIS — Z6841 Body Mass Index (BMI) 40.0 and over, adult: Secondary | ICD-10-CM | POA: Insufficient documentation

## 2018-12-31 DIAGNOSIS — I4892 Unspecified atrial flutter: Secondary | ICD-10-CM | POA: Diagnosis not present

## 2018-12-31 DIAGNOSIS — K219 Gastro-esophageal reflux disease without esophagitis: Secondary | ICD-10-CM | POA: Diagnosis not present

## 2018-12-31 DIAGNOSIS — I2510 Atherosclerotic heart disease of native coronary artery without angina pectoris: Secondary | ICD-10-CM | POA: Insufficient documentation

## 2018-12-31 DIAGNOSIS — F419 Anxiety disorder, unspecified: Secondary | ICD-10-CM | POA: Diagnosis not present

## 2018-12-31 DIAGNOSIS — M4316 Spondylolisthesis, lumbar region: Secondary | ICD-10-CM | POA: Diagnosis not present

## 2018-12-31 DIAGNOSIS — Z794 Long term (current) use of insulin: Secondary | ICD-10-CM | POA: Diagnosis not present

## 2018-12-31 DIAGNOSIS — G4733 Obstructive sleep apnea (adult) (pediatric): Secondary | ICD-10-CM | POA: Insufficient documentation

## 2018-12-31 DIAGNOSIS — I4891 Unspecified atrial fibrillation: Secondary | ICD-10-CM | POA: Diagnosis present

## 2018-12-31 NOTE — Progress Notes (Signed)
Primary Care Physician: Tammi Sou, MD  Referring Physician: [facility] Primary Cardiologist: Dr Stanford Breed Primary EP: Dr Cleda Mccreedy is a 58 y.o. male with a history of atrial flutter, OSA, HTN, DM who presents for follow up in the [facility].  The patient was initially diagnosed with atrial flutter 02/03/18 when his Apple Watch showed that he had an irregular rhythm. 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 He had no awareness of his arrhythmia.  On follow up today, patient is s/p atrial flutter ablation 07/09/18 with Dr Lovena Le. Patient report that he was still having some heart racing on his Apple Watch which did not clearly show afib or flutter. A Zio patch was ordered and results are pending. He denies any other symptoms.   Today, he denies symptoms of chest pain, orthopnea, PND, lower extremity edema, dizziness, presyncope, syncope, snoring, daytime somnolence, bleeding, or neurologic sequela. The patient is tolerating medications without difficulties and is otherwise without complaint today.    Atrial Fibrillation Risk Factors:  he does have symptoms or diagnosis of sleep apnea. he is compliant with CPAP therapy. he does not have a history of rheumatic fever. he does not have a history of significant alcohol use.  he has a BMI of Body mass index is 40.74 kg/m.Marland Kitchen Filed Weights   12/31/18 1006  Weight: (!) 140.1 kg     Atrial Fibrillation Management history:  Previous antiarrhythmic drugs: none Previous cardioversions: none Previous ablations: 07/09/18 flutter CHADS2VASC score: 4 (DM, HTN, CAD, CM) Anticoagulation history: Xarelto   Past Medical History:  Diagnosis Date  . Anxiety   . Arthritis    lower back   . Atrial flutter ([place]) 2020   Cards->lopressor and anticoag, ablation. In sinus still as of 08/2018 cards f/u.   Marland Kitchen CAD (coronary artery disease) 03/2018   Ostial first diagonal 60% narrowed, otherwise normal coronaries. Dr. Stanford Breed added ASA and statin 08/2018.  Marland Kitchen Depression   . Diabetes mellitus   . False positive stress test 03/2018   Cath showed normal coronaries with EF 50%  . GERD (gastroesophageal reflux disease)   . Hyperlipidemia   . Hypertension   . Hypogonadism, male 05/2015  . Increased prostate specific antigen (PSA) velocity 2019   06/2017 velocity up; recheck 10/2017 back to baseline.  Plan repeat 1 yr.  . Low back pain 2016   Left lumbar radiculopathy, lumbar spondylolisthesis.  Sciatica episode 03/2016.  Ortho getting L spine MRI as of 07/2016.  . Microcytic anemia 12/2015   suspect iron def due to malabsorption.  Hemoccults NEG 01/26/16, ferrous sulfate started.  . Neuropathic pain of left foot   . NICM (nonischemic cardiomyopathy) ([place]) 05/2018   EF 40-45%, +LV hypokinesis. Suspected to be tachycardia-mediated->Dr. Stanford Breed to rpt echo fall 2020.  Marland Kitchen OSA on CPAP    cpap settings at 3   Past Surgical History:  Procedure Laterality Date  . A-FLUTTER ABLATION N/A 07/09/2018   Procedure: A-FLUTTER ABLATION;  Surgeon: Evans Lance, MD;  Location: [facility];  Service: Cardiovascular;  Laterality: N/A;  . ADENOIDECTOMY    . BREATH TEK H PYLORI N/A 01/08/2013   Procedure: BREATH TEK H PYLORI;  Surgeon: [name], MD;  Location: [facility];  Service: General;  Laterality: N/A;  . CARDIAC CATHETERIZATION  03/2018   No signi obstructive CAD (suspected  FALSE POSITIVE STRESS TEST).  EF 50%.  Diastolic dysfunction.  Marland Kitchen CARDIOVASCULAR STRESS TEST  03/2018   Intermediate risk; EF 40%, medium sized defect with peri-infarct ischemia-->follow up cath showed no signif obstructive CAD.  Marland Kitchen COLONOSCOPY  04/17/2014   Benign polyp x 1.  Recall 10 yrs.  Marland Kitchen GASTRIC ROUX-EN-Y N/A 04/14/2013   Procedure: LAPAROSCOPIC ROUX-EN-Y GASTRIC BYPASS WITH UPPER ENDOSCOPY;  Surgeon: [name],  MD;  Location: [facility];  Service: General;  Laterality: N/A;  . LEFT HEART CATH AND CORONARY ANGIOGRAPHY N/A 03/22/2018   NO CAD.  EF 50%. Procedure: LEFT HEART CATH AND CORONARY ANGIOGRAPHY;  Surgeon: Belva Crome, MD;  Location: [facility];  Service: Cardiovascular;  Laterality: N/A;  . TONSILLECTOMY    . TRANSTHORACIC ECHOCARDIOGRAM  05/13/2018   severe hypokinesis of LV inferior and inferolateral walls, EF 40-45%, correlates with stress test.    Current Outpatient Medications  Medication Sig Dispense Refill  . ALPRAZolam (XANAX) 0.5 MG tablet TAKE 1 TABLET BY MOUTH EVERY 8 HOURS AS NEEDED FOR SLEEP OR ANXIETY (Patient taking differently: Take 0.5 mg by mouth every 8 (eight) hours as needed for anxiety or sleep. TAKE 1 TABLET BY MOUTH EVERY 8 HOURS AS NEEDED FOR SLEEP OR ANXIET) 90 tablet 5  . amLODipine (NORVASC) 10 MG tablet TAKE 1 TABLET BY MOUTH ONCE DAILY . APPOINTMENT REQUIRED FOR FUTURE REFILLS 30 tablet 0  . atorvastatin (LIPITOR) 40 MG tablet Take 1 tablet (40 mg total) by mouth daily. 90 tablet 3  . benazepril (LOTENSIN) 40 MG tablet Take 1 tablet by mouth once daily 90 tablet 0  . furosemide (LASIX) 40 MG tablet Take 1 tablet (40 mg total) by mouth daily. 30 tablet 5  . glipiZIDE (GLUCOTROL XL) 10 MG 24 hr tablet TAKE 1 TABLET BY MOUTH ONCE DAILY WITH BREAKFAST . APPOINTMENT REQUIRED FOR FUTURE REFILLS 30 tablet 0  . hydrochlorothiazide (MICROZIDE) 12.5 MG capsule Take 1 capsule (12.5 mg total) by mouth daily. 30 capsule 3  . Insulin Pen Needle 31G X 5 MM MISC Use with Victoza as directed 100 each 11  . liraglutide (VICTOZA) 18 MG/3ML SOPN INJECT 1.2 MG  SUBCUTANEOUSLY ONCE DAILY . APPOINTMENT REQUIRED FOR FUTURE REFILLS 6 mL 0  . metFORMIN (GLUCOPHAGE) 1000 MG tablet TAKE 1 TABLET BY MOUTH TWICE DAILY WITH A MEAL 60 tablet 0  . metoprolol tartrate (LOPRESSOR) 100 MG tablet Take 1 tablet by mouth twice daily 180 tablet 3  . pregabalin (LYRICA) 150 MG capsule Take 1 capsule  by mouth twice daily 60 capsule 5  . testosterone cypionate (DEPOTESTOSTERONE CYPIONATE) 200 MG/ML injection 1 ml IM q 14 days (Patient taking differently: Inject 200 mg into the muscle every 14 (fourteen) days. 1 ml IM q 14 days) 10 mL 0  . zaleplon (SONATA) 5 MG capsule TAKE 1 CAPSULE BY MOUTH AT BEDTIME AS NEEDED FOR SLEEP 90 capsule 1   No current facility-administered medications for this encounter.    No Known Allergies  Social History   Socioeconomic History  . Marital status: Divorced    Spouse name: Not on file  . Number of children: Not on file  . Years of education: Not on file  . Highest education level: Not on file  Occupational History  . Occupation: Paramedic-EMT    Employer: [employer]  Tobacco Use  . Smoking status: Never Smoker  . Smokeless tobacco: Former Systems developer    Types: Chew  Substance and Sexual Activity  .  Alcohol use: Yes    Alcohol/week: 12.0 standard drinks    Types: 12 Cans of beer per week    Comment:  12pk/per week  . Drug use: No  . Sexual activity: Not on file  Other Topics Concern  . Not on file  Social History Narrative   Married, no children.   Occupation: paramedic   Social Determinants of Radio broadcast assistant Strain:   . Difficulty of Paying Living Expenses: Not on file  Food Insecurity:   . Worried About Charity fundraiser in the Last Year: Not on file  . Ran Out of Food in the Last Year: Not on file  Transportation Needs:   . Lack of Transportation (Medical): Not on file  . Lack of Transportation (Non-Medical): Not on file  Physical Activity:   . Days of Exercise per Week: Not on file  . Minutes of Exercise per Session: Not on file  Stress:   . Feeling of Stress : Not on file  Social Connections:   . Frequency of Communication with Friends and Family: Not on file  . Frequency of Social Gatherings with Friends and Family: Not on file  . Attends Religious Services: Not on file  . Active Member of Clubs or  Organizations: Not on file  . Attends Archivist Meetings: Not on file  . Marital Status: Not on file  Intimate Partner Violence:   . Fear of Current or Ex-Partner: Not on file  . Emotionally Abused: Not on file  . Physically Abused: Not on file  . Sexually Abused: Not on file    Family History  Problem Relation Age of Onset  . Heart disease Father        valvular disease  . COPD Father   . Prostate cancer Father   . Arrhythmia Father   . Colon cancer Neg Hx    The patient does not have a history of early familial atrial fibrillation or other arrhythmias.  ROS- All systems are reviewed and negative except as per the HPI above.  Physical Exam: Vitals:   12/31/18 1006  BP: 124/78  Pulse: 87  Weight: (!) 140.1 kg  Height: 6\' 1"  (1.854 m)    GEN- The patient is well appearing obese male, alert and oriented x 3 today.   HEENT-head normocephalic, atraumatic, sclera clear, conjunctiva pink, hearing intact, trachea midline. Lungs- Clear to ausculation bilaterally, normal work of breathing Heart- Regular rate and rhythm, no murmurs, rubs or gallops  GI- soft, NT, ND, + BS Extremities- no clubbing, cyanosis. Trace bilateral edema MS- no significant deformity or atrophy Skin- no rash or lesion Psych- euthymic mood, full affect Neuro- strength and sensation are intact   Wt Readings from Last 3 Encounters:  12/31/18 (!) 140.1 kg  12/04/18 (!) 138.8 kg  08/26/18 134.3 kg    EKG today demonstrates SR HR 87, IVCD, PR 206, QRS 118, QTc 454  Epic records are reviewed at length today  [facility] 03/22/18  Elevated left ventricular end-diastolic pressure, 21 mmHg. EF 50%. Findings consistent with diastolic dysfunction.  Widely patent coronary arteries.  Normal left main  Normal LAD. Ostial first diagonal 60% narrowed.  Normal circumflex  Normal right coronary  False positive myocardial perfusion study related to diaphragm attenuation  Echo 12/03/18 1. Left  ventricular ejection fraction, by visual estimation, is 40 to 45%. The left ventricle has mildly decreased function. There is mildly increased left ventricular hypertrophy.  2. Left ventricular diastolic function could not  be evaluated.  3. Global right ventricle was not well visualized.The right ventricular size is not well visualized. Right vetricular wall thickness was not assessed.  4. Left atrial size was normal.  5. Right atrial size was moderately dilated.  6. Mild to moderate aortic valve annular calcification.  7. The mitral valve is normal in structure. No evidence of mitral valve regurgitation. No evidence of mitral stenosis.  8. The tricuspid valve is grossly normal. Tricuspid valve regurgitation is not demonstrated.  9. The aortic valve is normal in structure. Aortic valve regurgitation is not visualized. No evidence of aortic valve sclerosis or stenosis. 10. The pulmonic valve was normal in structure. Pulmonic valve regurgitation is not visualized. 11. The atrial septum is grossly normal.   Assessment and Plan:  1. Typical atrial flutter S/p atrial flutter ablation with Dr Lovena Le 07/09/18 Apple Watch strips reviewed which shows sinus tachycardia with PACs and PVCs. Will wait for Zio patch results. If afib seen on patch, would resume Xarelto 20 mg daily for a CHADS2VASC score of 4. Will also discuss AAD at that time. Continue metoprolol 100 mg BID. Lifestyle changes as below.  This patients CHA2DS2-VASc Score and unadjusted Ischemic Stroke Rate (% per year) is equal to 4.8 % stroke rate/year from a score of 4  Above score calculated as 1 point each if present [CHF, HTN, DM, Vascular=MI/PAD/Aortic Plaque, Age if 65-74, or Male] Above score calculated as 2 points each if present [Age > 75, or Stroke/TIA/TE]  2. Obesity Body mass index is 40.74 kg/m. Lifestyle modification was discussed and encouraged including regular physical activity and weight reduction.  3. Obstructive  sleep apnea The importance of adequate treatment of sleep apnea was discussed today in order to improve our ability to maintain sinus rhythm long term. Patient compliant with CPAP therapy.  4. HTN Stable, no changes today.   Follow up in the AF clinic in 3 months, sooner pending monitor results.   [facility] [address] [place], [place] [address] [phone] 12/31/2018 10:58 AM

## 2019-01-01 ENCOUNTER — Other Ambulatory Visit: Payer: Self-pay | Admitting: Family Medicine

## 2019-01-02 NOTE — Telephone Encounter (Signed)
RF request for testosterone.  Last OV 06/05/2018 Last RX 06/05/2018 # 105mL w/ 0 RF. No upcoming OV.  Please advise?

## 2019-01-02 NOTE — Telephone Encounter (Signed)
Pt needs f/u as per the rules for being on testosterone. I'll authorize 2 ml of testosterone but no more until seen for f/u.-thx

## 2019-01-03 DIAGNOSIS — N486 Induration penis plastica: Secondary | ICD-10-CM

## 2019-01-03 HISTORY — DX: Induration penis plastica: N48.6

## 2019-01-06 ENCOUNTER — Encounter: Payer: Self-pay | Admitting: Family Medicine

## 2019-01-06 NOTE — Telephone Encounter (Signed)
I'll send in the 10 ml bottle as requested, but I already sent in rx for two of the 1 ml bottles so pls cancel that one.

## 2019-01-06 NOTE — Telephone Encounter (Signed)
Appointment scheduled 01/08/19.

## 2019-01-06 NOTE — Telephone Encounter (Signed)
Sent MyChart message for patient to contact us to schedule appointment.

## 2019-01-08 ENCOUNTER — Ambulatory Visit (INDEPENDENT_AMBULATORY_CARE_PROVIDER_SITE_OTHER): Payer: Commercial Managed Care - PPO | Admitting: Family Medicine

## 2019-01-08 ENCOUNTER — Other Ambulatory Visit: Payer: Self-pay

## 2019-01-08 ENCOUNTER — Other Ambulatory Visit: Payer: Self-pay | Admitting: *Deleted

## 2019-01-08 ENCOUNTER — Other Ambulatory Visit: Payer: Self-pay | Admitting: Cardiology

## 2019-01-08 ENCOUNTER — Encounter: Payer: Self-pay | Admitting: Family Medicine

## 2019-01-08 VITALS — BP 149/91 | HR 80 | Temp 98.1°F | Resp 16 | Ht 73.0 in | Wt 306.8 lb

## 2019-01-08 DIAGNOSIS — E119 Type 2 diabetes mellitus without complications: Secondary | ICD-10-CM | POA: Diagnosis not present

## 2019-01-08 DIAGNOSIS — E291 Testicular hypofunction: Secondary | ICD-10-CM | POA: Diagnosis not present

## 2019-01-08 DIAGNOSIS — I10 Essential (primary) hypertension: Secondary | ICD-10-CM

## 2019-01-08 DIAGNOSIS — I4891 Unspecified atrial fibrillation: Secondary | ICD-10-CM

## 2019-01-08 DIAGNOSIS — R972 Elevated prostate specific antigen [PSA]: Secondary | ICD-10-CM | POA: Diagnosis not present

## 2019-01-08 DIAGNOSIS — E7800 Pure hypercholesterolemia, unspecified: Secondary | ICD-10-CM

## 2019-01-08 DIAGNOSIS — N4889 Other specified disorders of penis: Secondary | ICD-10-CM

## 2019-01-08 DIAGNOSIS — I428 Other cardiomyopathies: Secondary | ICD-10-CM

## 2019-01-08 HISTORY — PX: OTHER SURGICAL HISTORY: SHX169

## 2019-01-08 LAB — COMPREHENSIVE METABOLIC PANEL
ALT: 23 U/L (ref 0–53)
AST: 22 U/L (ref 0–37)
Albumin: 4.2 g/dL (ref 3.5–5.2)
Alkaline Phosphatase: 57 U/L (ref 39–117)
BUN: 13 mg/dL (ref 6–23)
CO2: 30 mEq/L (ref 19–32)
Calcium: 9.4 mg/dL (ref 8.4–10.5)
Chloride: 100 mEq/L (ref 96–112)
Creatinine, Ser: 0.86 mg/dL (ref 0.40–1.50)
GFR: 91.63 mL/min (ref >60.00)
Glucose, Bld: 167 mg/dL — ABNORMAL HIGH (ref 70–99)
Potassium: 4 mEq/L (ref 3.5–5.1)
Sodium: 139 mEq/L (ref 135–145)
Total Bilirubin: 0.6 mg/dL (ref 0.2–1.2)
Total Protein: 7 g/dL (ref 6.0–8.3)

## 2019-01-08 LAB — HEMOGLOBIN A1C: Hgb A1c MFr Bld: 8.3 % — ABNORMAL HIGH (ref 4.6–6.5)

## 2019-01-08 LAB — CBC
HCT: 43 % (ref 39.0–52.0)
Hemoglobin: 14.3 g/dL (ref 13.0–17.0)
MCHC: 33.3 g/dL (ref 30.0–36.0)
MCV: 88.7 fl (ref 78.0–100.0)
Platelets: 173 10*3/uL (ref 150.0–400.0)
RBC: 4.85 Mil/uL (ref 4.22–5.81)
RDW: 14.6 % (ref 11.5–15.5)
WBC: 4.6 10*3/uL (ref 4.0–10.5)

## 2019-01-08 LAB — TESTOSTERONE: Testosterone: 549.79 ng/dL (ref 300.00–890.00)

## 2019-01-08 LAB — LIPID PANEL
Cholesterol: 90 mg/dL (ref 0–200)
HDL: 31.1 mg/dL — ABNORMAL LOW (ref >39.00)
LDL Cholesterol: 32 mg/dL (ref 0–99)
NonHDL: 58.57
Total CHOL/HDL Ratio: 3
Triglycerides: 132 mg/dL (ref 0.0–149.0)
VLDL: 26.4 mg/dL (ref 0.0–40.0)

## 2019-01-08 LAB — PSA: PSA: 2.03 ng/mL (ref 0.10–4.00)

## 2019-01-08 LAB — MICROALBUMIN / CREATININE URINE RATIO
Creatinine,U: 158.8 mg/dL
Microalb Creat Ratio: 1.7 mg/g (ref 0.0–30.0)
Microalb, Ur: 2.6 mg/dL — ABNORMAL HIGH (ref 0.0–1.9)

## 2019-01-08 MED ORDER — RIVAROXABAN 20 MG PO TABS: 20.0000 mg | ORAL_TABLET | Freq: Every day | ORAL | Status: DC

## 2019-01-08 MED ORDER — TESTOSTERONE CYPIONATE 200 MG/ML IM SOLN: INTRAMUSCULAR | 0 refills | Status: AC

## 2019-01-08 MED ORDER — HYDROCHLOROTHIAZIDE 25 MG PO TABS: 25.0000 mg | ORAL_TABLET | Freq: Every day | ORAL | 3 refills | Status: DC

## 2019-01-08 NOTE — Progress Notes (Signed)
OFFICE VISIT  01/08/2019   CC:  Chief Complaint  Patient presents with  . Follow-up    RCI, pt is fasting     HPI:    Patient is a 59 y.o. Caucasian male who presents for here for 6 mo f/u DM 2, male hypogonadism, HTN, HLD, and chronic generalized anxiety. A/P as of last visit: "1) HTN: not ideal control. Add hctz 12.5mg  qd. BMET-future.  2) DM 2: control is fair. HbA1c future.  3) Chronic anxiety with insomnia: taking alprazolam sparingly, taking zaleplon hs on prn basis. Needs CSC renewed->we'll have him sign new contract when he comes in for labs soon.   4) Hx of inc PSA velocity: f/u PSA was back to baseline, next PSA due around 10/2018."  Interim hx: Main issue that bothers him is curvature in penis, gradually worsening and urination is more difficult and sprays many directions. No pain.  Denies any acute injury to penis in past. He doesn't feel any knots/hard spots on skin.  Curvature worsens with erection but he says he also notes it when urinating when it is not erect.    PMP AWARE reviewed today: most recent rx for lyrica 150mg  was filled 01/01/19, # 75, rx by me. Most recent testost cyp trx fillred 12/02/18, #2 ml, rx by me. No red flags. Phentermine noted on this list->37.5mg , #35, last filled 12/10/18 by a provider named Roger Page in [place], [place]--[facility] clinic. Pt has not taken alprazolam in several months: last filled 10/09/17, #90, rx by me.  DM: I increased his glipizide to 10mg  qd b/c A1c has been gradually rising. No home glucose monitoring. Feet: R side no sx's.  L foot chronically numb, some burning if sits for long time. Lyrica helping as far as we can tell but he has not gone w/out it. He'll do a ween off and see how his foot feels.  HTN: no home monitoring.  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 HR varies: he says 110 a lot-->says cardiology will be doing a monitor on him.  Testost 61ml q2 wks.  Most  recent inj was 12/28/18.  ROS: no CP, no SOB, no wheezing, no cough, no dizziness, no HAs, no rashes, no melena/hematochezia.  No polyuria or polydipsia.  No myalgias or arthralgias.    Past Medical History:  Diagnosis Date  . Anxiety   . Arthritis    lower back   . Atrial flutter ([place]) 2020   Cards->lopressor and anticoag, ablation. In sinus still as of 08/2018 cards f/u.  Marland Kitchen CAD (coronary artery disease) 03/2018   Ostial first diagonal 60% narrowed, otherwise normal coronaries. Dr. Stanford Breed added ASA and statin 08/2018.  Marland Kitchen Depression   . Diabetes mellitus   . False positive stress test 03/2018   Cath showed normal coronaries with EF 50%  . GERD (gastroesophageal reflux disease)   . Hyperlipidemia   . Hypertension   . Hypogonadism, male 05/2015  . Increased prostate specific antigen (PSA) velocity 2019   06/2017 velocity up; recheck 10/2017 back to baseline.  Plan repeat 1 yr.  . Low back pain 2016   Left lumbar radiculopathy, lumbar spondylolisthesis.  Sciatica episode 03/2016.  Ortho getting L spine MRI as of 07/2016.  . Microcytic anemia 12/2015   suspect iron def due to malabsorption.  Hemoccults NEG 01/26/16, ferrous sulfate started.  . Neuropathic pain of left foot   . NICM (nonischemic cardiomyopathy) ([place]) 05/2018   EF 40-45%, +LV hypokinesis. Suspected  to be tachycardia-mediated->Dr. Stanford Breed to rpt echo fall 2020.  Marland Kitchen OSA on CPAP    cpap settings at 3    Past Surgical History:  Procedure Laterality Date  . A-FLUTTER ABLATION N/A 07/09/2018   Procedure: A-FLUTTER ABLATION;  Surgeon: Evans Lance, MD;  Location: [facility];  Service: Cardiovascular;  Laterality: N/A;  . ADENOIDECTOMY    . BREATH TEK H PYLORI N/A 01/08/2013   Procedure: [place];  Surgeon: [name], MD;  Location: [facility];  Service: General;  Laterality: N/A;  . CARDIAC CATHETERIZATION  03/2018   No signi obstructive CAD (suspected FALSE POSITIVE STRESS TEST).  EF 50%.   Diastolic dysfunction.  Marland Kitchen CARDIOVASCULAR STRESS TEST  03/2018   Intermediate risk; EF 40%, medium sized defect with peri-infarct ischemia-->follow up cath showed no signif obstructive CAD.  Marland Kitchen COLONOSCOPY  04/17/2014   Benign polyp x 1.  Recall 10 yrs.  Marland Kitchen GASTRIC ROUX-EN-Y N/A 04/14/2013   Procedure: LAPAROSCOPIC ROUX-EN-Y GASTRIC BYPASS WITH UPPER ENDOSCOPY;  Surgeon: [name], MD;  Location: [facility];  Service: General;  Laterality: N/A;  . LEFT HEART CATH AND CORONARY ANGIOGRAPHY N/A 03/22/2018   NO CAD.  EF 50%. Procedure: LEFT HEART CATH AND CORONARY ANGIOGRAPHY;  Surgeon: Belva Crome, MD;  Location: [facility];  Service: Cardiovascular;  Laterality: N/A;  . TONSILLECTOMY    . TRANSTHORACIC ECHOCARDIOGRAM  05/13/2018   severe hypokinesis of LV inferior and inferolateral walls, EF 40-45%, correlates with stress test.    Outpatient Medications Prior to Visit  Medication Sig Dispense Refill  . ALPRAZolam (XANAX) 0.5 MG tablet TAKE 1 TABLET BY MOUTH EVERY 8 HOURS AS NEEDED FOR SLEEP OR ANXIETY (Patient taking differently: Take 0.5 mg by mouth every 8 (eight) hours as needed for anxiety or sleep. TAKE 1 TABLET BY MOUTH EVERY 8 HOURS AS NEEDED FOR SLEEP OR ANXIET) 90 tablet 5  . amLODipine (NORVASC) 10 MG tablet TAKE 1 TABLET BY MOUTH ONCE DAILY . APPOINTMENT REQUIRED FOR FUTURE REFILLS 30 tablet 0  . benazepril (LOTENSIN) 40 MG tablet Take 1 tablet by mouth once daily 90 tablet 0  . furosemide (LASIX) 40 MG tablet Take 1 tablet (40 mg total) by mouth daily. 30 tablet 5  . glipiZIDE (GLUCOTROL XL) 10 MG 24 hr tablet TAKE 1 TABLET BY MOUTH ONCE DAILY WITH BREAKFAST . APPOINTMENT REQUIRED FOR FUTURE REFILLS 30 tablet 0  . Insulin Pen Needle 31G X 5 MM MISC Use with Victoza as directed 100 each 11  . liraglutide (VICTOZA) 18 MG/3ML SOPN INJECT 1.2 MG  SUBCUTANEOUSLY ONCE DAILY . APPOINTMENT REQUIRED FOR FUTURE REFILLS 6 mL 0  . metFORMIN (GLUCOPHAGE) 1000 MG tablet TAKE 1 TABLET BY  MOUTH TWICE DAILY WITH A MEAL 60 tablet 0  . metoprolol tartrate (LOPRESSOR) 100 MG tablet Take 1 tablet by mouth twice daily 180 tablet 3  . pregabalin (LYRICA) 150 MG capsule Take 1 capsule by mouth twice daily 60 capsule 5  . zaleplon (SONATA) 5 MG capsule TAKE 1 CAPSULE BY MOUTH AT BEDTIME AS NEEDED FOR SLEEP 90 capsule 1  . hydrochlorothiazide (MICROZIDE) 12.5 MG capsule Take 1 capsule (12.5 mg total) by mouth daily. 30 capsule 3  . testosterone cypionate (DEPOTESTOSTERONE CYPIONATE) 200 MG/ML injection 1 ml IM q 14 days (Patient taking differently: Inject 200 mg into the muscle every 14 (fourteen) days. 1 ml IM q 14 days) 10 mL 0  . atorvastatin (LIPITOR) 40 MG tablet Take  1 tablet (40 mg total) by mouth daily. 90 tablet 3   No facility-administered medications prior to visit.    No Known Allergies  ROS As per HPI  PE: Initial bp today 149/91.  Repeat manual bp 140/90 Blood pressure (!) 149/91, pulse 80, temperature 98.1 F (36.7 C), temperature source Temporal, resp. rate 16, height 6\' 1"  (1.854 m), weight (!) 306 lb 12.8 oz (139.2 kg), SpO2 95 %. Body mass index is 40.48 kg/m.  Gen: Alert, well appearing.  Patient is oriented to person, place, time, and situation. AFFECT: pleasant, lucid thought and speech. Genitals normal; both testes normal without tenderness, masses, hydroceles, varicoceles, erythema or swelling. Shaft normal, uncircumcised, meatus normal without discharge. No inguinal hernia noted. No inguinal lymphadenopathy. Foot exam - bilateral normal; no swelling, tenderness or skin or vascular lesions. Color and temperature is normal. Sensation is intact to monofilament testing.  L foot lacks perception of vibratory sensation. Peripheral pulses are palpable. Toenails are normal.   LABS:   Lab Results  Component Value Date   PSA 1.82 10/09/2017   PSA 2.81 06/25/2017   PSA 1.75 06/23/2016     Lab Results  Component Value Date   TESTOSTERONE 218.25 (L) 02/27/2018       Chemistry      Component Value Date/Time   NA 137 06/13/2018 0915   K 4.4 06/13/2018 0915   CL 102 06/13/2018 0915   CO2 25 06/13/2018 0915   BUN 13 06/13/2018 0915   CREATININE 0.96 06/13/2018 0915   CREATININE 0.86 02/23/2017 1553      Component Value Date/Time   CALCIUM 9.1 06/13/2018 0915   ALKPHOS 51 02/03/2018 1018   AST 26 02/03/2018 1018   ALT 27 02/03/2018 1018   BILITOT 0.6 02/03/2018 1018     Lab Results  Component Value Date   WBC 4.2 07/09/2018   HGB 13.0 07/09/2018   HCT 39.7 07/09/2018   MCV 88.8 07/09/2018   PLT 136 (L) 07/09/2018   Lab Results  Component Value Date   HGBA1C 7.8 (H) 06/13/2018   Lab Results  Component Value Date   CHOL 142 10/09/2017   HDL 35.80 (L) 10/09/2017   LDLCALC 71 10/09/2017   LDLDIRECT 75.0 06/23/2016   TRIG 177.0 (H) 10/09/2017   CHOLHDL 4 10/09/2017    IMPRESSION AND PLAN:  1) HTN: uncontrolled.  Inc hctz to 25mg  qd. Needs to do more monitoring outside of medical office. Lytes/cr today.  2) HLD: tolerating atorva 40mg  qd. Dr. Stanford Breed has ordered lipid panel and hepatic panel: we'll draw these today and forward to Dr. Stanford Breed.  3) DM: no home monitoring. Feet exam showed dec vib sens on L foot today but o/w normal. HbA1c and urine microalb/cr today. BMET today.  4) Male hypogonadism: check testost level today, CBC, and PSA. CSC today.  5) Inc PSA velocity: last check PSA had gone back down.  Recheck today: monitor close, esp in light of him being on testost replacement.  6) Acquired penile curvature: pt requests referral to urology so I did this today.  An After Visit Summary was printed and given to the patient.  FOLLOW UP: Return in about 4 months (around 05/08/2019) for annual CPE (fasting).  Signed:  Crissie Sickles, MD           01/08/2019

## 2019-01-09 ENCOUNTER — Other Ambulatory Visit: Payer: Self-pay | Admitting: Family Medicine

## 2019-01-13 ENCOUNTER — Ambulatory Visit (HOSPITAL_COMMUNITY): Payer: Commercial Managed Care - PPO | Admitting: Physician Assistant

## 2019-01-14 ENCOUNTER — Other Ambulatory Visit: Payer: Self-pay

## 2019-01-14 ENCOUNTER — Encounter (HOSPITAL_COMMUNITY): Payer: Self-pay | Admitting: Physician Assistant

## 2019-01-14 ENCOUNTER — Ambulatory Visit (HOSPITAL_COMMUNITY)
Admission: RE | Admit: 2019-01-14 | Discharge: 2019-01-14 | Disposition: A | Discharge status: Home / Self Care | Payer: Commercial Managed Care - PPO | Source: Ambulatory Visit | Attending: Physician Assistant | Admitting: Physician Assistant

## 2019-01-14 VITALS — BP 126/68 | HR 76 | Ht 73.0 in | Wt 308.0 lb

## 2019-01-14 DIAGNOSIS — Z794 Long term (current) use of insulin: Secondary | ICD-10-CM | POA: Diagnosis not present

## 2019-01-14 DIAGNOSIS — Z9884 Bariatric surgery status: Secondary | ICD-10-CM | POA: Insufficient documentation

## 2019-01-14 DIAGNOSIS — Z6841 Body Mass Index (BMI) 40.0 and over, adult: Secondary | ICD-10-CM | POA: Diagnosis not present

## 2019-01-14 DIAGNOSIS — F329 Major depressive disorder, single episode, unspecified: Secondary | ICD-10-CM | POA: Insufficient documentation

## 2019-01-14 DIAGNOSIS — R9431 Abnormal electrocardiogram [ECG] [EKG]: Secondary | ICD-10-CM | POA: Insufficient documentation

## 2019-01-14 DIAGNOSIS — E669 Obesity, unspecified: Secondary | ICD-10-CM | POA: Insufficient documentation

## 2019-01-14 DIAGNOSIS — E119 Type 2 diabetes mellitus without complications: Secondary | ICD-10-CM | POA: Diagnosis not present

## 2019-01-14 DIAGNOSIS — F419 Anxiety disorder, unspecified: Secondary | ICD-10-CM | POA: Insufficient documentation

## 2019-01-14 DIAGNOSIS — K219 Gastro-esophageal reflux disease without esophagitis: Secondary | ICD-10-CM | POA: Diagnosis not present

## 2019-01-14 DIAGNOSIS — G4733 Obstructive sleep apnea (adult) (pediatric): Secondary | ICD-10-CM | POA: Diagnosis not present

## 2019-01-14 DIAGNOSIS — M4726 Other spondylosis with radiculopathy, lumbar region: Secondary | ICD-10-CM | POA: Insufficient documentation

## 2019-01-14 DIAGNOSIS — I10 Essential (primary) hypertension: Secondary | ICD-10-CM | POA: Insufficient documentation

## 2019-01-14 DIAGNOSIS — D6869 Other thrombophilia: Secondary | ICD-10-CM | POA: Diagnosis not present

## 2019-01-14 DIAGNOSIS — I2510 Atherosclerotic heart disease of native coronary artery without angina pectoris: Secondary | ICD-10-CM | POA: Diagnosis not present

## 2019-01-14 DIAGNOSIS — E785 Hyperlipidemia, unspecified: Secondary | ICD-10-CM | POA: Insufficient documentation

## 2019-01-14 DIAGNOSIS — Z7901 Long term (current) use of anticoagulants: Secondary | ICD-10-CM | POA: Diagnosis not present

## 2019-01-14 DIAGNOSIS — I4892 Unspecified atrial flutter: Secondary | ICD-10-CM | POA: Diagnosis not present

## 2019-01-14 DIAGNOSIS — I480 Paroxysmal atrial fibrillation: Secondary | ICD-10-CM

## 2019-01-14 DIAGNOSIS — Z79899 Other long term (current) drug therapy: Secondary | ICD-10-CM | POA: Insufficient documentation

## 2019-01-14 MED ORDER — CARVEDILOL 25 MG PO TABS: 25.0000 mg | ORAL_TABLET | Freq: Two times a day (BID) | ORAL | 6 refills | Status: DC

## 2019-01-14 NOTE — Progress Notes (Addendum)
Primary Care Physician: Tammi Sou, MD  Referring Physician: [facility] Primary Cardiologist: Dr Stanford Breed Primary EP: Dr Cleda Mccreedy is a 59 y.o. male with a history of atrial flutter, OSA, HTN, DM who presents for follow up in the [facility].  The patient was initially diagnosed with atrial flutter 02/03/18 when his Apple Watch showed that he had an irregular rhythm. 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 He had no awareness of his arrhythmia. Patient is s/p atrial flutter ablation 07/09/18 with Dr Lovena Le.   On follow up today, Zio patch showed 35% afib burden with rapid rates at times. He was started back on Xarelto for a CHADS2VASC score of 4 by Dr Stanford Breed. Overall, he is asymptomatic with his arrhythmias. He knows his heart races at times because of his Apple Watch. There are no triggers that he could identify.   Today, he denies symptoms of palpitations, chest pain, SOB, orthopnea, PND, lower extremity edema, dizziness, presyncope, syncope, snoring, daytime somnolence, bleeding, or neurologic sequela. The patient is tolerating medications without difficulties and is otherwise without complaint today.    Atrial Fibrillation Risk Factors:  he does have symptoms or diagnosis of sleep apnea. he is compliant with CPAP therapy. he does not have a history of rheumatic fever. he does not have a history of significant alcohol use.  he has a BMI of Body mass index is 40.64 kg/m.Marland Kitchen Filed Weights   01/14/19 1023  Weight: (!) 139.7 kg     Atrial Fibrillation Management history:  Previous antiarrhythmic drugs: none Previous cardioversions: none Previous ablations: 07/09/18 flutter CHADS2VASC score: 4 (DM, HTN, CAD, CM) Anticoagulation history: Xarelto   Past Medical History:  Diagnosis Date  . Anxiety   . Arthritis    lower back     . Atrial flutter ([place]) 2020   Cards->lopressor and anticoag, ablation. In sinus still as of 08/2018 cards f/u.  Marland Kitchen CAD (coronary artery disease) 03/2018   Ostial first diagonal 60% narrowed, otherwise normal coronaries. Dr. Stanford Breed added ASA and statin 08/2018.  Marland Kitchen Depression   . Diabetes mellitus   . False positive stress test 03/2018   Cath showed normal coronaries with EF 50%  . GERD (gastroesophageal reflux disease)   . Hyperlipidemia   . Hypertension   . Hypogonadism, male 05/2015  . Increased prostate specific antigen (PSA) velocity 2019   06/2017 velocity up; recheck 10/2017 back to baseline.  Plan repeat 1 yr.  . Low back pain 2016   Left lumbar radiculopathy, lumbar spondylolisthesis.  Sciatica episode 03/2016.  Ortho getting L spine MRI as of 07/2016.  . Microcytic anemia 12/2015   suspect iron def due to malabsorption.  Hemoccults NEG 01/26/16, ferrous sulfate started.  . Neuropathic pain of left foot   . NICM (nonischemic cardiomyopathy) ([place]) 05/2018   EF 40-45%, +LV hypokinesis. Suspected to be tachycardia-mediated->Dr. Stanford Breed to rpt echo fall 2020.  Marland Kitchen OSA on CPAP    cpap settings at 3   Past Surgical History:  Procedure Laterality Date  . A-FLUTTER ABLATION N/A 07/09/2018   Procedure: A-FLUTTER ABLATION;  Surgeon: Evans Lance, MD;  Location: [facility];  Service: Cardiovascular;  Laterality: N/A;  . ADENOIDECTOMY    . BREATH TEK H PYLORI N/A 01/08/2013   Procedure: BREATH TEK H PYLORI;  Surgeon: [name], MD;  Location:  [facility];  Service: General;  Laterality: N/A;  . CARDIAC CATHETERIZATION  03/2018   No signi obstructive CAD (suspected FALSE POSITIVE STRESS TEST).  EF 50%.  Diastolic dysfunction.  Marland Kitchen CARDIOVASCULAR STRESS TEST  03/2018   Intermediate risk; EF 40%, medium sized defect with peri-infarct ischemia-->follow up cath showed no signif obstructive CAD.  Marland Kitchen COLONOSCOPY  04/17/2014   Benign polyp x 1.  Recall 10 yrs.  Marland Kitchen GASTRIC ROUX-EN-Y N/A  04/14/2013   Procedure: LAPAROSCOPIC ROUX-EN-Y GASTRIC BYPASS WITH UPPER ENDOSCOPY;  Surgeon: [name], MD;  Location: [facility];  Service: General;  Laterality: N/A;  . LEFT HEART CATH AND CORONARY ANGIOGRAPHY N/A 03/22/2018   NO CAD.  EF 50%. Procedure: LEFT HEART CATH AND CORONARY ANGIOGRAPHY;  Surgeon: Belva Crome, MD;  Location: [facility];  Service: Cardiovascular;  Laterality: N/A;  . TONSILLECTOMY    . TRANSTHORACIC ECHOCARDIOGRAM  05/13/2018   severe hypokinesis of LV inferior and inferolateral walls, EF 40-45%, correlates with stress test.    Current Outpatient Medications  Medication Sig Dispense Refill  . ALPRAZolam (XANAX) 0.5 MG tablet TAKE 1 TABLET BY MOUTH EVERY 8 HOURS AS NEEDED FOR SLEEP OR ANXIETY (Patient taking differently: Take 0.5 mg by mouth every 8 (eight) hours as needed for anxiety or sleep. TAKE 1 TABLET BY MOUTH EVERY 8 HOURS AS NEEDED FOR SLEEP OR ANXIET) 90 tablet 5  . amLODipine (NORVASC) 10 MG tablet Take 1 tablet (10 mg total) by mouth daily. 90 tablet 0  . atorvastatin (LIPITOR) 40 MG tablet Take 1 tablet (40 mg total) by mouth daily. 90 tablet 3  . benazepril (LOTENSIN) 40 MG tablet Take 1 tablet by mouth once daily 90 tablet 0  . furosemide (LASIX) 40 MG tablet Take 1 tablet by mouth once daily 90 tablet 0  . glipiZIDE (GLUCOTROL XL) 10 MG 24 hr tablet Take 1 tablet (10 mg total) by mouth daily with breakfast. 90 tablet 0  . hydrochlorothiazide (HYDRODIURIL) 25 MG tablet Take 1 tablet (25 mg total) by mouth daily. 90 tablet 3  . Insulin Pen Needle 31G X 5 MM MISC Use with Victoza as directed 100 each 11  . liraglutide (VICTOZA) 18 MG/3ML SOPN INJECT 1.2 MG  SUBCUTANEOUSLY ONCE DAILY . APPOINTMENT REQUIRED FOR FUTURE REFILLS 6 mL 0  . metFORMIN (GLUCOPHAGE) 1000 MG tablet TAKE 1 TABLET BY MOUTH TWICE DAILY WITH A MEAL 60 tablet 0  . pregabalin (LYRICA) 150 MG capsule Take 1 capsule by mouth twice daily 60 capsule 5  . rivaroxaban (XARELTO) 20 MG  TABS tablet Take 1 tablet (20 mg total) by mouth daily with supper. 30 tablet   . testosterone cypionate (DEPOTESTOSTERONE CYPIONATE) 200 MG/ML injection 1 ml IM q 14 days 10 mL 0  . zaleplon (SONATA) 5 MG capsule TAKE 1 CAPSULE BY MOUTH AT BEDTIME AS NEEDED FOR SLEEP 90 capsule 1  . carvedilol (COREG) 25 MG tablet Take 1 tablet (25 mg total) by mouth 2 (two) times daily. 60 tablet 6   No current facility-administered medications for this encounter.    No Known Allergies  Social History   Socioeconomic History  . Marital status: Divorced    Spouse name: Not on file  . Number of children: Not on file  . Years of education: Not on file  . Highest education level: Not on file  Occupational History  . Occupation: Paramedic-EMT    Employer: [employer]  Tobacco Use  . Smoking status: Never Smoker  .  Smokeless tobacco: Former Systems developer    Types: Chew  Substance and Sexual Activity  . Alcohol use: Yes    Alcohol/week: 18.0 standard drinks    Types: 18 Cans of beer per week    Comment: 18  . Drug use: No  . Sexual activity: Not on file  Other Topics Concern  . Not on file  Social History Narrative   Married, no children.   Occupation: paramedic   Social Determinants of Radio broadcast assistant Strain:   . Difficulty of Paying Living Expenses: Not on file  Food Insecurity:   . Worried About Charity fundraiser in the Last Year: Not on file  . Ran Out of Food in the Last Year: Not on file  Transportation Needs:   . Lack of Transportation (Medical): Not on file  . Lack of Transportation (Non-Medical): Not on file  Physical Activity:   . Days of Exercise per Week: Not on file  . Minutes of Exercise per Session: Not on file  Stress:   . Feeling of Stress : Not on file  Social Connections:   . Frequency of Communication with Friends and Family: Not on file  . Frequency of Social Gatherings with Friends and Family: Not on file  . Attends Religious Services: Not on  file  . Active Member of Clubs or Organizations: Not on file  . Attends Archivist Meetings: Not on file  . Marital Status: Not on file  Intimate Partner Violence:   . Fear of Current or Ex-Partner: Not on file  . Emotionally Abused: Not on file  . Physically Abused: Not on file  . Sexually Abused: Not on file    Family History  Problem Relation Age of Onset  . Heart disease Father        valvular disease  . COPD Father   . Prostate cancer Father   . Arrhythmia Father   . Colon cancer Neg Hx    The patient does not have a history of early familial atrial fibrillation or other arrhythmias.  ROS- All systems are reviewed and negative except as per the HPI above.  Physical Exam: Vitals:   01/14/19 1023  BP: 126/68  Pulse: 76  Weight: (!) 139.7 kg  Height: 6\' 1"  (1.854 m)    GEN- The patient is well appearing obese male, alert and oriented x 3 today.   HEENT-head normocephalic, atraumatic, sclera clear, conjunctiva pink, hearing intact, trachea midline. Lungs- Clear to ausculation bilaterally, normal work of breathing Heart- Regular rate and rhythm, no murmurs, rubs or gallops  GI- soft, NT, ND, + BS Extremities- no clubbing, cyanosis. Trace bilateral edema MS- no significant deformity or atrophy Skin- no rash or lesion Psych- euthymic mood, full affect Neuro- strength and sensation are intact   Wt Readings from Last 3 Encounters:  01/14/19 (!) 139.7 kg  01/08/19 (!) 139.2 kg  12/31/18 (!) 140.1 kg    EKG today demonstrates SR HR 76, NST, PR 174, QRS 116, QTc 416  Epic records are reviewed at length today  [facility] 03/22/18  Elevated left ventricular end-diastolic pressure, 21 mmHg. EF 50%. Findings consistent with diastolic dysfunction.  Widely patent coronary arteries.  Normal left main  Normal LAD. Ostial first diagonal 60% narrowed.  Normal circumflex  Normal right coronary  False positive myocardial perfusion study related to diaphragm  attenuation  Echo 12/03/18 1. Left ventricular ejection fraction, by visual estimation, is 40 to 45%. The left ventricle has mildly decreased function.  There is mildly increased left ventricular hypertrophy.  2. Left ventricular diastolic function could not be evaluated.  3. Global right ventricle was not well visualized.The right ventricular size is not well visualized. Right vetricular wall thickness was not assessed.  4. Left atrial size was normal.  5. Right atrial size was moderately dilated.  6. Mild to moderate aortic valve annular calcification.  7. The mitral valve is normal in structure. No evidence of mitral valve regurgitation. No evidence of mitral stenosis.  8. The tricuspid valve is grossly normal. Tricuspid valve regurgitation is not demonstrated.  9. The aortic valve is normal in structure. Aortic valve regurgitation is not visualized. No evidence of aortic valve sclerosis or stenosis. 10. The pulmonic valve was normal in structure. Pulmonic valve regurgitation is not visualized. 11. The atrial septum is grossly normal.   Assessment and Plan:  1. Paroxysmal atrial fibrillation/Typical atrial flutter S/p atrial flutter ablation with Dr Lovena Le 07/09/18 Zio patch showed 35% afib burden with an overall rate of 100-110.  We discussed therapeutic options today in detail. For AAD we could do dofetilide but given his lack of symptoms and the current COVID restrictions would avoid an elective hospitalization for now. Also given his young age would be less inclined to start amiodarone. Would avoid class IC and Multaq with reduced EF. Will plan for rate control for now with possible dofetilide vs afib ablation in the future.  Continue Xarelto 20 mg daily Stop metoprolol and start Coreg 25 mg BID. Lifestyle changes as below. Apple watch for monitoring of heart rate.   This patients CHA2DS2-VASc Score and unadjusted Ischemic Stroke Rate (% per year) is equal to 4.8 % stroke rate/year  from a score of 4  Above score calculated as 1 point each if present [CHF, HTN, DM, Vascular=MI/PAD/Aortic Plaque, Age if 65-74, or Male] Above score calculated as 2 points each if present [Age > 75, or Stroke/TIA/TE]  2. Obesity Body mass index is 40.64 kg/m. Lifestyle modification was discussed and encouraged including regular physical activity and weight reduction.  3. Obstructive sleep apnea The importance of adequate treatment of sleep apnea was discussed today in order to improve our ability to maintain sinus rhythm long term. Patient compliant with CPAP therapy.  4. HTN Stable, med changes as above.    Follow up with the AF clinic in 3 months.    [facility] [address] [place], [place] [address] [phone] 01/14/2019 2:03 PM

## 2019-01-14 NOTE — Patient Instructions (Signed)
Stop metoprolol  Start Coreg 25mg  twice a day

## 2019-01-21 ENCOUNTER — Other Ambulatory Visit: Payer: Self-pay | Admitting: Family Medicine

## 2019-01-24 ENCOUNTER — Encounter: Payer: Self-pay | Admitting: Family Medicine

## 2019-01-27 ENCOUNTER — Other Ambulatory Visit: Payer: Self-pay | Admitting: Family Medicine

## 2019-01-27 ENCOUNTER — Encounter: Payer: Self-pay | Admitting: Family Medicine

## 2019-01-27 MED ORDER — JARDIANCE 10 MG PO TABS: 10.0000 mg | ORAL_TABLET | Freq: Every day | ORAL | 2 refills | Status: DC

## 2019-01-27 NOTE — Telephone Encounter (Signed)
Good morning [name],  Thank you for letting us know. We will send in one of the two medications to [facility], [place].

## 2019-02-10 ENCOUNTER — Other Ambulatory Visit: Payer: Self-pay | Admitting: Family Medicine

## 2019-02-10 NOTE — Telephone Encounter (Signed)
RF request for Lyrica LOV:01/08/19 Next ov: advised to f/u May Last written:07/30/18 (60,5) bid  It was discussed to try and ween off. Unsure if refill appropriate. Please advise, thanks. Medication pending

## 2019-02-18 ENCOUNTER — Encounter: Payer: Self-pay | Admitting: Family Medicine

## 2019-02-19 ENCOUNTER — Other Ambulatory Visit: Payer: Self-pay | Admitting: Family Medicine

## 2019-02-19 NOTE — Telephone Encounter (Signed)
RF request for Victoza LOV:01/08/19 Next ov: advised to f/u May for CPE Last written:01/21/19(66mL,0)  RF request for Metformin LOV:01/08/19 Next ov: advised to f/u May for CPE Last written:01/21/19(60,0)  RF request for Sonata LOV:01/08/19 Next ov: advised to f/u May for CPE Last written:07/15/18(90,1)  Medications pending. Please advise, thanks.

## 2019-03-19 ENCOUNTER — Telehealth: Payer: Self-pay

## 2019-03-19 NOTE — Telephone Encounter (Signed)
PA sent via covermymed on 03/19/19   Key: [id number]   Medication: Testosterone cypionate 200MG /ML   Dx: E29.1, Male hypogonadism   Per Dr. Anitra Lauth pt has tried and failed Androgel 20.25MG /1.25GM   Waiting for response.

## 2019-03-19 NOTE — Telephone Encounter (Signed)
PA approved 03/19/19, will notify pharmacy and send patient MyChart message.

## 2019-03-31 ENCOUNTER — Ambulatory Visit (HOSPITAL_COMMUNITY): Payer: Commercial Managed Care - PPO | Admitting: Physician Assistant

## 2019-04-07 ENCOUNTER — Other Ambulatory Visit: Payer: Self-pay | Admitting: Family Medicine

## 2019-04-07 ENCOUNTER — Encounter: Payer: Self-pay | Admitting: Family Medicine

## 2019-04-08 ENCOUNTER — Encounter (HOSPITAL_BASED_OUTPATIENT_CLINIC_OR_DEPARTMENT_OTHER): Payer: Self-pay | Admitting: *Deleted

## 2019-04-08 ENCOUNTER — Emergency Department (HOSPITAL_BASED_OUTPATIENT_CLINIC_OR_DEPARTMENT_OTHER)
Admission: EM | Admit: 2019-04-08 | Discharge: 2019-04-08 | Disposition: A | Discharge status: Home / Self Care | Payer: Commercial Managed Care - PPO | Attending: Emergency Medicine | Admitting: Emergency Medicine

## 2019-04-08 ENCOUNTER — Other Ambulatory Visit: Payer: Self-pay

## 2019-04-08 ENCOUNTER — Telehealth: Payer: Self-pay

## 2019-04-08 ENCOUNTER — Emergency Department (HOSPITAL_BASED_OUTPATIENT_CLINIC_OR_DEPARTMENT_OTHER): Payer: Commercial Managed Care - PPO

## 2019-04-08 DIAGNOSIS — I952 Hypotension due to drugs: Secondary | ICD-10-CM | POA: Insufficient documentation

## 2019-04-08 DIAGNOSIS — E785 Hyperlipidemia, unspecified: Secondary | ICD-10-CM | POA: Insufficient documentation

## 2019-04-08 DIAGNOSIS — E119 Type 2 diabetes mellitus without complications: Secondary | ICD-10-CM | POA: Diagnosis not present

## 2019-04-08 DIAGNOSIS — Z7901 Long term (current) use of anticoagulants: Secondary | ICD-10-CM | POA: Diagnosis not present

## 2019-04-08 DIAGNOSIS — Z79899 Other long term (current) drug therapy: Secondary | ICD-10-CM | POA: Insufficient documentation

## 2019-04-08 DIAGNOSIS — Z9861 Coronary angioplasty status: Secondary | ICD-10-CM | POA: Diagnosis not present

## 2019-04-08 DIAGNOSIS — E876 Hypokalemia: Secondary | ICD-10-CM | POA: Insufficient documentation

## 2019-04-08 DIAGNOSIS — I2510 Atherosclerotic heart disease of native coronary artery without angina pectoris: Secondary | ICD-10-CM | POA: Diagnosis not present

## 2019-04-08 DIAGNOSIS — I480 Paroxysmal atrial fibrillation: Secondary | ICD-10-CM | POA: Insufficient documentation

## 2019-04-08 DIAGNOSIS — Z794 Long term (current) use of insulin: Secondary | ICD-10-CM | POA: Insufficient documentation

## 2019-04-08 DIAGNOSIS — R42 Dizziness and giddiness: Secondary | ICD-10-CM | POA: Diagnosis present

## 2019-04-08 DIAGNOSIS — Z531 Procedure and treatment not carried out because of patient's decision for reasons of belief and group pressure: Secondary | ICD-10-CM | POA: Diagnosis not present

## 2019-04-08 DIAGNOSIS — R55 Syncope and collapse: Secondary | ICD-10-CM | POA: Diagnosis not present

## 2019-04-08 LAB — CBC WITH DIFFERENTIAL/PLATELET
Abs Immature Granulocytes: 0 10*3/uL (ref 0.00–0.07)
Basophils Absolute: 0 10*3/uL (ref 0.0–0.1)
Basophils Relative: 1 %
Eosinophils Absolute: 0.1 10*3/uL (ref 0.0–0.5)
Eosinophils Relative: 2 %
HCT: 33.9 % — ABNORMAL LOW (ref 39.0–52.0)
Hemoglobin: 10.4 g/dL — ABNORMAL LOW (ref 13.0–17.0)
Immature Granulocytes: 0 %
Lymphocytes Relative: 26 %
Lymphs Abs: 1.5 10*3/uL (ref 0.7–4.0)
MCH: 26.4 pg (ref 26.0–34.0)
MCHC: 30.7 g/dL (ref 30.0–36.0)
MCV: 86 fL (ref 80.0–100.0)
Monocytes Absolute: 0.6 10*3/uL (ref 0.1–1.0)
Monocytes Relative: 11 %
Neutro Abs: 3.6 10*3/uL (ref 1.7–7.7)
Neutrophils Relative %: 60 %
Platelets: 228 10*3/uL (ref 150–400)
RBC: 3.94 MIL/uL — ABNORMAL LOW (ref 4.22–5.81)
RDW: 13.6 % (ref 11.5–15.5)
WBC: 5.9 10*3/uL (ref 4.0–10.5)
nRBC: 0 % (ref 0.0–0.2)

## 2019-04-08 LAB — BASIC METABOLIC PANEL
Anion gap: 17 — ABNORMAL HIGH (ref 5–15)
BUN: 18 mg/dL (ref 6–20)
CO2: 20 mmol/L — ABNORMAL LOW (ref 22–32)
Calcium: 9.3 mg/dL (ref 8.9–10.3)
Chloride: 102 mmol/L (ref 98–111)
Creatinine, Ser: 1.49 mg/dL — ABNORMAL HIGH (ref 0.61–1.24)
GFR calc Af Amer: 60 mL/min — ABNORMAL LOW (ref >60)
GFR calc non Af Amer: 51 mL/min — ABNORMAL LOW (ref >60)
Glucose, Bld: 96 mg/dL (ref 70–99)
Potassium: 3.2 mmol/L — ABNORMAL LOW (ref 3.5–5.1)
Sodium: 139 mmol/L (ref 135–145)

## 2019-04-08 LAB — BRAIN NATRIURETIC PEPTIDE: B Natriuretic Peptide: 84.7 pg/mL (ref 0.0–100.0)

## 2019-04-08 LAB — TROPONIN I (HIGH SENSITIVITY)
Troponin I (High Sensitivity): 8 ng/L (ref <18)
Troponin I (High Sensitivity): 8 ng/L (ref <18)

## 2019-04-08 MED ORDER — SODIUM CHLORIDE 0.9 % IV BOLUS
500.0000 mL | Freq: Once | INTRAVENOUS | Status: AC
  Administered 2019-04-08: 19:00:00 500 mL via INTRAVENOUS

## 2019-04-08 MED ORDER — POTASSIUM CHLORIDE CRYS ER 20 MEQ PO TBCR
20.0000 meq | EXTENDED_RELEASE_TABLET | Freq: Once | ORAL | Status: AC
  Administered 2019-04-08: 20 meq via ORAL
  Filled 2019-04-08: qty 1

## 2019-04-08 NOTE — ED Provider Notes (Signed)
[facility]GENC[facility] DEPARTMENT Provider Note   CSN: [id number] Arrival date & time: 04/08/19  1734     History Chief Complaint  Patient presents with  . Dizziness    Ethan Pitts is a 59 y.o. male with history of paroxysmal A. fib, hyperlipidemia, hypertension, GERD, diabetes mellitus, nonischemic cardiomyopathy presenting for evaluation of acute onset, persistent lightheadedness and generalized weakness for 4 days.  He tells me that today he realized that he was taking his metoprolol 100 mg as well as his carvedilol 25 mg twice daily.  Per chart review the metoprolol was discontinued on 01/14/2019 and he had not been taking it until recently.  He is meant to only be on carvedilol 25 mg twice daily.  He tells me that he feels lightheaded when he stands up too quickly but has not had any syncopal episodes.  Earlier today when he walked out of a restaurant after having lunch and into bright sunlight he felt as though his vision was blurry for a few seconds but this has resolved.  He denies chest pain, shortness of breath, abdominal pain, nausea, vomiting, numbness or weakness of the extremities.  He reports compliance with his anticoagulation.  The history is provided by the patient.       Past Medical History:  Diagnosis Date  . Anxiety   . Arthritis    lower back   . Atrial flutter ([place]) 2020   Cards->lopressor and anticoag, ablation. In sinus still as of 08/2018 cards f/u.  Recurrence + PAF->requiring pt to get back on xarelto  . BPH with obstruction/lower urinary tract symptoms    started flomax via urol 03/2019  . CAD (coronary artery disease) 03/2018   Ostial first diagonal 60% narrowed, otherwise normal coronaries. Dr. Stanford Breed added ASA and statin 08/2018.  Marland Kitchen Depression   . Diabetes mellitus   . False positive stress test 03/2018   Cath showed normal coronaries with EF 50%  . GERD (gastroesophageal reflux disease)   . Hyperlipidemia   . Hypertension   .  Hypogonadism, male 05/2015  . Increased prostate specific antigen (PSA) velocity 2019   06/2017 velocity up; recheck 10/2017 back to baseline.  Plan repeat 1 yr.  . Low back pain 2016   Left lumbar radiculopathy, lumbar spondylolisthesis.  Sciatica episode 03/2016.  Ortho getting L spine MRI as of 07/2016.  . Microcytic anemia 12/2015   suspect iron def due to malabsorption.  Hemoccults NEG 01/26/16, ferrous sulfate started.  . Neuropathic pain of left foot   . NICM (nonischemic cardiomyopathy) ([place]) 05/2018   EF 40-45%, +LV hypokinesis. Suspected to be tachycardia-mediated->Dr. Stanford Breed to rpt echo fall 2020.  Marland Kitchen OSA on CPAP    cpap settings at 3  . Peyronie's disease 2021   Dr. Porfirio Oar (collegenase intralesional injections)    Patient Active Problem List   Diagnosis Date Noted  . Paroxysmal atrial fibrillation ([place]) 01/14/2019  . Secondary hypercoagulable state ([place]) 01/14/2019  . NICM (nonischemic cardiomyopathy) ([place]) 12/04/2018  . CAD (coronary artery disease) 12/04/2018  . Abnormal nuclear stress test 03/21/2018  . Depression   . Acute bronchitis with symptoms > 10 days 02/12/2018  . Atrial flutter ([place]) 02/12/2018  . Microcytic anemia 12/03/2015  . Hypogonadism, male 05/03/2015  . Diabetes mellitus without complication ([place]) 123XX123  . Insomnia 03/05/2014  . Encounter for vitamin deficiency screening 02/26/2014  . Preventative health care 02/26/2014  . BMI 37.0-37.9, adult 02/26/2014  . Lumbar pain with radiation down left leg 02/26/2014  .  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 Morbid obesity ([place]) 05/03/2009  . DEGENERATIVE JOINT DISEASE 05/03/2009  . DM type 2 (diabetes mellitus, type 2) ([place]) 02/17/2009  . Hyperlipidemia 02/17/2009  . OBSTRUCTIVE SLEEP APNEA 08/23/2007  . Essential hypertension 05/22/2006    Past Surgical History:  Procedure Laterality Date  . A-FLUTTER ABLATION N/A 07/09/2018   Procedure: A-FLUTTER ABLATION;  Surgeon: Evans Lance,  MD;  Location: [facility];  Service: Cardiovascular;  Laterality: N/A;  . ADENOIDECTOMY    . BREATH TEK H PYLORI N/A 01/08/2013   Procedure: [place];  Surgeon: [name], MD;  Location: [facility];  Service: General;  Laterality: N/A;  . CARDIAC CATHETERIZATION  03/2018   No signi obstructive CAD (suspected FALSE POSITIVE STRESS TEST).  EF 50%.  Diastolic dysfunction.  Marland Kitchen CARDIOVASCULAR STRESS TEST  03/2018   Intermediate risk; EF 40%, medium sized defect with peri-infarct ischemia-->follow up cath showed no signif obstructive CAD.  Marland Kitchen COLONOSCOPY  04/17/2014   Benign polyp x 1.  Recall 10 yrs.  Marland Kitchen GASTRIC ROUX-EN-Y N/A 04/14/2013   Procedure: LAPAROSCOPIC ROUX-EN-Y GASTRIC BYPASS WITH UPPER ENDOSCOPY;  Surgeon: [name], MD;  Location: [facility];  Service: General;  Laterality: N/A;  . LEFT HEART CATH AND CORONARY ANGIOGRAPHY N/A 03/22/2018   NO CAD.  EF 50%. Procedure: LEFT HEART CATH AND CORONARY ANGIOGRAPHY;  Surgeon: Belva Crome, MD;  Location: [facility];  Service: Cardiovascular;  Laterality: N/A;  . Rhythm monitoring  01/08/2019   7 day monitor->Sinus rhythm with occasional PAC, PVC, paroxysmal atrial flutter, paroxysmal atrial fibrillation and 7 beats nonsustained ventricular tachycardia: xarelto restarted  . TONSILLECTOMY    . TRANSTHORACIC ECHOCARDIOGRAM  05/13/2018;12/03/18   05/2018 severe hypokinesis of LV inferior and inferolateral walls, EF 40-45%, correlates with stress test.  [id number], diastolic fxn/LV filling pressure not determined due to pt in a fib/flutter       Family History  Problem Relation Age of Onset  . Heart disease Father        valvular disease  . COPD Father   . Prostate cancer Father   . Arrhythmia Father   . Colon cancer Neg Hx     Social History   Tobacco Use  . Smoking status: Never Smoker  . Smokeless tobacco: Former Systems developer    Types: Chew  Substance Use Topics  . Alcohol use: Yes     Alcohol/week: 18.0 standard drinks    Types: 18 Cans of beer per week    Comment: 18  . Drug use: No    Home Medications Prior to Admission medications   Medication Sig Start Date End Date Taking? Authorizing Provider  tamsulosin (FLOMAX) 0.4 MG CAPS capsule Take 0.4 mg by mouth.   Yes [provider]  ALPRAZolam (XANAX) 0.5 MG tablet TAKE 1 TABLET BY MOUTH EVERY 8 HOURS AS NEEDED FOR SLEEP OR ANXIETY Patient taking differently: Take 0.5 mg by mouth every 8 (eight) hours as needed for anxiety or sleep. TAKE 1 TABLET BY MOUTH EVERY 8 HOURS AS NEEDED FOR SLEEP OR ANXIET 10/09/17   McGowen, Adrian Blackwater, MD  amLODipine (NORVASC) 10 MG tablet Take 1 tablet (10 mg total) by mouth daily. 01/10/19   McGowen, Adrian Blackwater, MD  atorvastatin (LIPITOR) 40 MG tablet Take 1 tablet (40 mg total) by mouth daily. 08/26/18 01/14/19  Lelon Perla, MD  benazepril (LOTENSIN) 40 MG tablet Take 1 tablet by mouth once daily 04/07/19  McGowen, Adrian Blackwater, MD  carvedilol (COREG) 25 MG tablet Take 1 tablet (25 mg total) by mouth 2 (two) times daily. 01/14/19 01/14/20  Fenton, Clint R, PA  empagliflozin (JARDIANCE) 10 MG TABS tablet Take 10 mg by mouth daily before breakfast. 01/27/19   McGowen, Adrian Blackwater, MD  furosemide (LASIX) 40 MG tablet Take 1 tablet by mouth once daily 01/10/19   McGowen, Adrian Blackwater, MD  glipiZIDE (GLUCOTROL XL) 10 MG 24 hr tablet Take 1 tablet (10 mg total) by mouth daily with breakfast. 01/10/19   McGowen, Adrian Blackwater, MD  hydrochlorothiazide (HYDRODIURIL) 25 MG tablet Take 1 tablet (25 mg total) by mouth daily. 01/08/19   McGowen, Adrian Blackwater, MD  Insulin Pen Needle 31G X 5 MM MISC Use with Victoza as directed 01/15/18   McGowen, Adrian Blackwater, MD  liraglutide (VICTOZA) 18 MG/3ML SOPN INJECT 1.2MG  SUBCUTANEOUSLY ONCE DAILY 02/19/19   McGowen, Adrian Blackwater, MD  metFORMIN (GLUCOPHAGE) 1000 MG tablet TAKE 1 TABLET BY MOUTH TWICE DAILY WITH A MEAL 02/19/19   McGowen, Adrian Blackwater, MD  pregabalin (LYRICA) 150 MG capsule Take 1 capsule  by mouth twice daily 02/10/19   McGowen, Adrian Blackwater, MD  rivaroxaban (XARELTO) 20 MG TABS tablet Take 1 tablet (20 mg total) by mouth daily with supper. 01/08/19   Lelon Perla, MD  testosterone cypionate (DEPOTESTOSTERONE CYPIONATE) 200 MG/ML injection 1 ml IM q 14 days 01/08/19   McGowen, Adrian Blackwater, MD  zaleplon (SONATA) 5 MG capsule TAKE 1 CAPSULE BY MOUTH AT BEDTIME AS NEEDED FOR SLEEP 02/19/19   McGowen, Adrian Blackwater, MD    Allergies    Patient has no known allergies.  Review of Systems   Review of Systems  Constitutional: Positive for fatigue. Negative for chills and fever.  Respiratory: Negative for shortness of breath.   Cardiovascular: Negative for chest pain.  Gastrointestinal: Negative for abdominal pain, nausea and vomiting.  Neurological: Positive for weakness (generalized) and light-headedness. Negative for syncope and numbness.  All other systems reviewed and are negative.   Physical Exam Updated Vital Signs BP 110/60 (BP Location: Right Arm)   Pulse 81   Temp 99.2 F (37.3 C) (Oral)   Resp 18   Ht 6\' 1"  (1.854 m)   Wt (!) 138.3 kg   SpO2 93%   BMI 40.24 kg/m   Physical Exam Vitals and nursing note reviewed.  Constitutional:      General: He is not in acute distress.    Appearance: He is well-developed. He is obese.  HENT:     Head: Normocephalic and atraumatic.  Eyes:     General:        Right eye: No discharge.        Left eye: No discharge.     Conjunctiva/sclera: Conjunctivae normal.     Pupils: Pupils are equal, round, and reactive to light.  Neck:     Vascular: No JVD.     Trachea: No tracheal deviation.  Cardiovascular:     Rate and Rhythm: Normal rate. Rhythm irregular.     Pulses: Normal pulses.     Comments: 2+ radial trace pitting edema of the bilateral lower extremities.  Bevelyn Buckles' sign absent bilaterally. Pulmonary:     Effort: Pulmonary effort is normal.     Breath sounds: Normal breath sounds.  Abdominal:     General: There is no distension.       Tenderness: There is no abdominal tenderness. There is no guarding or rebound.  Musculoskeletal:  Cervical back: Normal range of motion and neck supple.  Skin:    General: Skin is warm and dry.     Findings: No erythema.  Neurological:     Mental Status: He is alert.     Comments: Speech is fluent and goal oriented.  Cranial nerves appear grossly intact.  Moves all extremities spontaneously without difficulty.  Psychiatric:        Behavior: Behavior normal.     ED Results / Procedures / Treatments   Labs (all labs ordered are listed, but only abnormal results are displayed) Labs Reviewed  BASIC METABOLIC PANEL - Abnormal; Notable for the following components:      Result Value   Potassium 3.2 (*)    CO2 20 (*)    Creatinine, Ser 1.49 (*)    GFR calc non Af Amer 51 (*)    GFR calc Af Amer 60 (*)    Anion gap 17 (*)    All other components within normal limits  CBC WITH DIFFERENTIAL/PLATELET - Abnormal; Notable for the following components:   RBC 3.94 (*)    Hemoglobin 10.4 (*)    HCT 33.9 (*)    All other components within normal limits  BRAIN NATRIURETIC PEPTIDE  TROPONIN I (HIGH SENSITIVITY)  TROPONIN I (HIGH SENSITIVITY)    EKG EKG Interpretation  Date/Time:  Tuesday April 08 2019 17:46:24 EDT Ventricular Rate:  99 PR Interval:    QRS Duration: 112 QT Interval:  352 QTC Calculation: 451 R Axis:   22 Text Interpretation: Atrial fibrillation Incomplete left bundle branch block Nonspecific T wave abnormality Abnormal ECG No STEMI Confirmed by Nanda Quinton [phone]) on 04/08/2019 5:54:24 PM   Radiology DG Chest Portable 1 View  Result Date: 04/08/2019 CLINICAL DATA:  59 year old male with weakness. EXAM: PORTABLE CHEST 1 VIEW COMPARISON:  Chest radiograph dated 02/03/2018. FINDINGS: No focal consolidation, pleural effusion, or pneumothorax. Stable top-normal cardiac size. No acute osseous pathology. IMPRESSION: No acute cardiopulmonary process. Electronically  Signed   By: Anner Crete M.D.   On: 04/08/2019 20:25    Procedures Procedures (including critical care time)  Medications Ordered in ED Medications  sodium chloride 0.9 % bolus 500 mL (0 mLs Intravenous Stopped 04/08/19 2010)  potassium chloride SA (KLOR-CON) CR tablet 20 mEq (20 mEq Oral Given 04/08/19 2343)    ED Course  I have reviewed the triage vital signs and the nursing notes.  Pertinent labs & imaging results that were available during my care of the patient were reviewed by me and considered in my medical decision making (see chart for details).    MDM Rules/Calculators/A&P                      Patient presents for evaluation of lightheadedness with standing, near syncope that has been present for 4 days.  He feels quite certain that his symptoms began after he began taking 2 beta-blockers daily erroneously.  He is afebrile, hypotensive on arrival to the ED.  His blood pressures did improve with a 500 cc bolus of normal saline.  At 1 point his O2 saturations dropped to 88% but he tells me he feels confident this occurred while he was sleeping and he has a history of OSA.  He feels strongly that he does not wish to be admitted to the hospital.  He was observed in the department for over 4 hours with significant improvement.  He was initially found to be in A. fib with rates ranging  from the 80s to 110s.  While in the ED he converted back to normal sinus rhythm with rates in the 80s.  His EKG shows no acute ischemic abnormalities and serial troponins are stable.  He has no chest pain and I doubt ACS/MI.  His chest x-ray shows no acute cardiopulmonary abnormalities with no evidence of edema or consolidation.  Remainder of lab work reviewed by me shows no leukocytosis, new anemia with hemoglobin of 10.4 though he is not so low as to require blood transfusion.  His creatinine is a little bumped from baseline but BUN is within normal limits and he is tolerating p.o. fluid ED.  He is mildly  hypokalemic, replenished orally in the ED.  His BNP is within normal limits.  Doubt PE, dissection, cardiac tamponade, esophageal rupture, pneumonia or pneumothorax.  He was ambulated in the ED with stable SPO2 saturations and did not require supplemental oxygen.  He reports that with ambulation now he feels much better after the IV fluids and no longer feels lightheaded with standing.  Orthostatic vital signs were obtained which showed labile heart rate though he was still in A. fib and I question if the monitor was able to pick up on an accurate heart rate.  His blood pressures were stable and he was not symptomatic when orthostatic vital signs were obtained.  He clinically appears well and is anxious to get home to his family.  He reports he will be able to follow-up closely with his PCP and his cardiologist on an outpatient basis and I encouraged him to do so within the next 24 to 48 hours.  We discussed that he needs to discontinue the metoprolol that he was taking erroneously and should monitor his blood pressures before he takes the carvedilol.  If he is hypotensive before taking carvedilol he will call his PCP or cardiologist for further recommendations.  Discussed strict ED return precautions.  Patient verbalized understanding of and agreement with plan and is stable for discharge at this time.  Patient was seen and evaluated by Dr. Laverta Baltimore who agrees with assessment and plan at this time.   Final Clinical Impression(s) / ED Diagnoses Final diagnoses:  Near syncope  Lightheadedness  Hypotension due to drugs  Hypokalemia    Rx / DC Orders ED Discharge Orders    None       Renita Papa, PA-C 04/09/19 1442    Margette Fast, MD 04/10/19 [phone]

## 2019-04-08 NOTE — ED Triage Notes (Signed)
Dizziness for the past few weeks. States he has been taking 2 of his BP medications by accident x 4 days. He is alert and very talkative.

## 2019-04-08 NOTE — Progress Notes (Signed)
Patient ambulated back and forth to the bathroom while on pulse ox.  Patient's SPO2 remained between 95% and 93%.  Patients HR was between 125 and 130 upon returning to his room.  Patient stated that he feels fine.

## 2019-04-08 NOTE — ED Notes (Signed)
Oxygen applied at 2 liters/min via [employer]

## 2019-04-08 NOTE — Telephone Encounter (Signed)
Agree/noted. 

## 2019-04-08 NOTE — Telephone Encounter (Signed)
Patient requested appt with Dr. Anitra Lauth. Stated that for the last few weeks he has has been feeling light headed, dizzy when he goes outside. He is fine when he comes inside and sits down.  I offered him several appts starting tomorrow thru next Thursday. He declined all of them.  Requests call back.  Pt can be reached at [phone]

## 2019-04-08 NOTE — Telephone Encounter (Signed)
Contacted patient and he stated he was still taking metoprolol 100mg  which was d/c on 01/14/19 and started on carvedilol 25mg  bid. Advised to stop taking metoprolol and only take carvedilol as directed. He thinks this may have been causing him to feel lightheaded and dizzy. He has f/u appt next week with cardiology.

## 2019-04-08 NOTE — Discharge Instructions (Addendum)
Drink plenty of fluids and get rest. Take your medications as prescribed. Check your blood pressure tomorrow and if it is too low do not take your morning blood pressure medicine.  Please call your primary care provider or your cardiologist to schedule follow-up.  Return to the emergency department if any concerning signs or symptoms develop such as loss of consciousness, shortness of breath, chest pain, severe headaches.

## 2019-04-10 ENCOUNTER — Other Ambulatory Visit: Payer: Self-pay

## 2019-04-10 ENCOUNTER — Encounter (HOSPITAL_COMMUNITY): Payer: Self-pay | Admitting: Emergency Medicine

## 2019-04-10 ENCOUNTER — Emergency Department (HOSPITAL_COMMUNITY)
Admission: EM | Admit: 2019-04-10 | Discharge: 2019-04-10 | Discharge status: Against Medical Advice | Payer: Commercial Managed Care - PPO | Attending: Emergency Medicine | Admitting: Emergency Medicine

## 2019-04-10 DIAGNOSIS — Z5321 Procedure and treatment not carried out due to patient leaving prior to being seen by health care provider: Secondary | ICD-10-CM | POA: Diagnosis not present

## 2019-04-10 DIAGNOSIS — R42 Dizziness and giddiness: Secondary | ICD-10-CM | POA: Insufficient documentation

## 2019-04-10 LAB — BASIC METABOLIC PANEL
Anion gap: 7 (ref 5–15)
BUN: 19 mg/dL (ref 6–20)
CO2: 28 mmol/L (ref 22–32)
Calcium: 9.4 mg/dL (ref 8.9–10.3)
Chloride: 101 mmol/L (ref 98–111)
Creatinine, Ser: 1.36 mg/dL — ABNORMAL HIGH (ref 0.61–1.24)
GFR calc Af Amer: >60 mL/min (ref >60)
GFR calc non Af Amer: 57 mL/min — ABNORMAL LOW (ref >60)
Glucose, Bld: 124 mg/dL — ABNORMAL HIGH (ref 70–99)
Potassium: 4 mmol/L (ref 3.5–5.1)
Sodium: 136 mmol/L (ref 135–145)

## 2019-04-10 LAB — CBC
HCT: 33.1 % — ABNORMAL LOW (ref 39.0–52.0)
Hemoglobin: 9.9 g/dL — ABNORMAL LOW (ref 13.0–17.0)
MCH: 25.8 pg — ABNORMAL LOW (ref 26.0–34.0)
MCHC: 29.9 g/dL — ABNORMAL LOW (ref 30.0–36.0)
MCV: 86.2 fL (ref 80.0–100.0)
Platelets: 181 10*3/uL (ref 150–400)
RBC: 3.84 MIL/uL — ABNORMAL LOW (ref 4.22–5.81)
RDW: 13.9 % (ref 11.5–15.5)
WBC: 4.8 10*3/uL (ref 4.0–10.5)
nRBC: 0 % (ref 0.0–0.2)

## 2019-04-10 NOTE — ED Triage Notes (Signed)
Pt reports for the past week having dizziness and had issues with his BP running low. Reports that he was supposed to stop BP medication but somehow ended up back in pill box so figured out that was the cause 2 days ago when seen at [facility]. Hx a fib.

## 2019-04-11 ENCOUNTER — Telehealth: Payer: Self-pay | Admitting: Cardiology

## 2019-04-11 NOTE — Telephone Encounter (Signed)
New Message    Pt is calling because he was at the ED and wants to talk to the nurse about that visit     Please call back

## 2019-04-11 NOTE — Telephone Encounter (Signed)
[id number]

## 2019-04-11 NOTE — Telephone Encounter (Signed)
Called and spoke with pt, states he went to the ED on Tuesday after his vision was spotty. Stated all he coud see ws sunlight, could not see his son or his care. He put his sunglasses on and this helped. When he went to the ED his BP was 80/50.  He is complaining of lightheaded/dizziness but denies SOB or Chest Pain. He went to the ED again yesterday but stated that someone came out and said the hospital was full and the ED was full and it would be a long wait, so he left. Currently his BP is 90/56.   He reports being started on Carvedilol and had also been taking metoprolol and did not realized he had been taking both until Tuesday. He then stopped taking the metoprolol and has only been taking his Carvedilol (25mg  BID) since then. He reports having vision changes yesterday but this was only after eating. States he is fine while sitting but he gets dizzy/lightheaded when he gets up.   He reports that he had blood drawn yesterday and they checked his blood sugar and it was 96 after he had finished eating, he reports that this is lower for him after eating and it is usually in the 150s after a meal. Reviewed this with DOD Dr.Gibson she recommends that he hold is Carvedilol for the time being, and stop his HCTZ all together. She also recommends only taking his amlodipine in the morning if his BP is 130/80 or greater and that he should increase his intake of salty foods (soup, gatorade.Marland Kitchen) and have a follow up appt in office next week.  Discussed recommendations from [place] with pt and he verbalized understanding. Notified that if he begins to have the vision changes with his low BP or other symptoms such as dizziness/lightheadedness with sitting down or if he passes out, to seek immediate evaluation in his nearest ED. Pt verbalized understanding with hospital recommendations as well.  Able to schedule pt for in office visit with Kerin Ransom PA 04/17/19 at 8:45am No other questions from the pt at this  time. Will route to MD and PA to make aware

## 2019-04-11 NOTE — Telephone Encounter (Signed)
Patient requesting same day appointment with Dr. Anitra Lauth. His blood pressure is 98/60.

## 2019-04-11 NOTE — Telephone Encounter (Signed)
Pt was called and he said that this was his BP this AM. He states he is not short of breath or dizzy, he just knows that is way too low. Pt was advised we hane no openings and he needs to call Dr Lonia Skinner office and get their recommendations on what he needs to do. Pt was advised to go to ED if he started experiencing SOB, chest pain, or vertigo worsens. Fall risks reviewed with patient and encourage him to drink plenty of fluids.

## 2019-04-12 ENCOUNTER — Other Ambulatory Visit: Payer: Self-pay | Admitting: Family Medicine

## 2019-04-15 ENCOUNTER — Encounter (HOSPITAL_COMMUNITY): Payer: Self-pay | Admitting: Physician Assistant

## 2019-04-15 ENCOUNTER — Ambulatory Visit (HOSPITAL_COMMUNITY)
Admission: RE | Admit: 2019-04-15 | Discharge: 2019-04-15 | Disposition: A | Discharge status: Home / Self Care | Payer: Commercial Managed Care - PPO | Source: Ambulatory Visit | Attending: Physician Assistant | Admitting: Physician Assistant

## 2019-04-15 ENCOUNTER — Other Ambulatory Visit: Payer: Self-pay

## 2019-04-15 VITALS — BP 142/98 | HR 76 | Ht 73.0 in | Wt 305.0 lb

## 2019-04-15 DIAGNOSIS — I480 Paroxysmal atrial fibrillation: Secondary | ICD-10-CM

## 2019-04-15 DIAGNOSIS — E669 Obesity, unspecified: Secondary | ICD-10-CM | POA: Diagnosis not present

## 2019-04-15 DIAGNOSIS — Z7901 Long term (current) use of anticoagulants: Secondary | ICD-10-CM

## 2019-04-15 DIAGNOSIS — G4733 Obstructive sleep apnea (adult) (pediatric): Secondary | ICD-10-CM

## 2019-04-15 DIAGNOSIS — D6869 Other thrombophilia: Secondary | ICD-10-CM

## 2019-04-15 DIAGNOSIS — I10 Essential (primary) hypertension: Secondary | ICD-10-CM

## 2019-04-15 DIAGNOSIS — Z87891 Personal history of nicotine dependence: Secondary | ICD-10-CM

## 2019-04-15 DIAGNOSIS — Z713 Dietary counseling and surveillance: Secondary | ICD-10-CM

## 2019-04-15 DIAGNOSIS — Z6841 Body Mass Index (BMI) 40.0 and over, adult: Secondary | ICD-10-CM

## 2019-04-15 MED ORDER — CARVEDILOL 25 MG PO TABS: 25.0000 mg | ORAL_TABLET | Freq: Two times a day (BID) | ORAL | 6 refills | Status: DC

## 2019-04-15 NOTE — Progress Notes (Signed)
Electrophysiology TeleHealth Note   Due to national recommendations of social distancing due to [place] 19, Audio/video telehealth visit is felt to be most appropriate for this patient at this time.  See consent below from today for patient consent regarding telehealth for the At[facility].    Date:  04/15/2019   ID:  Ethan Pitts, DOB [birth year], MRN [id number]  Location: home  Provider location: [address] [place], [place] [address] Evaluation Performed: Follow up  PCP:  Tammi Sou, MD  Primary Cardiologist:  Dr Stanford Breed Primary Electrophysiologist: Dr Lovena Le   CC: Follow up for atrial fibrillation   History of Present Illness: Ethan Pitts is a 59 y.o. male  with a history of atrial flutter, OSA, HTN, DM who presents for follow up in the [facility]. The patient was initially diagnosed with atrial flutter 02/03/18 when his Apple Watch showed that he had an irregular rhythm. 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 He had no awareness of his arrhythmia. Patient is s/p atrial flutter ablation 07/09/18 with Dr Lovena Le. Zio patch showed 35% afib burden with rapid rates at times. He was started back on Xarelto for a CHADS2VASC score of 4 by Dr Stanford Breed. Overall, he is asymptomatic with his arrhythmias. He knows his heart races at times because of his Apple Watch. There are no triggers that he could identify.  On follow up today, patient was seen at the ER 04/08/19 for hypotension. Patient states he was inadvertently taking metoprolol and carvedilol together. He was also found to be in afib but converted to SR after fluid bolus. Since stopping both carvedilol and metoprolol, he has noted some heart rates up to 110s. Prior to this, he states his afib has been well controlled with few episodes on his Apple Watch and always rate controlled.     Today, he denies symptoms of palpitations, chest pain, shortness of breath, orthopnea, PND, lower extremity edema, claudication, dizziness, presyncope, syncope, bleeding, or neurologic sequela. The patient is tolerating medications without difficulties and is otherwise without complaint today.    Atrial Fibrillation Risk Factors:  he does have symptoms or diagnosis of sleep apnea. he is compliant with CPAP therapy. he does not have a history of rheumatic fever. he does not have a history of alcohol use.   he has a BMI of Body mass index is 40.24 kg/m.Marland Kitchen Filed Weights   04/15/19 0957  Weight: (!) 138.3 kg     BP 142/98 Pulse 76   Atrial Fibrillation Management history:  Previous antiarrhythmic drugs: none Previous cardioversions: none Previous ablations: 07/09/18 flutter CHADS2VASC score: 4 (DM, HTN, CAD, CM) Anticoagulation history: Xarelto   Past Medical History:  Diagnosis Date  . Anxiety   . Arthritis    lower back   . Atrial flutter ([place]) 2020   Cards->lopressor and anticoag, ablation. In sinus still as of 08/2018 cards f/u.  Recurrence + PAF->requiring pt to get back on xarelto  . BPH with obstruction/lower urinary tract symptoms    started flomax via urol 03/2019  . CAD (coronary artery disease) 03/2018   Ostial first diagonal 60% narrowed, otherwise normal coronaries. Dr. Stanford Breed added ASA and statin 08/2018.  Marland Kitchen Depression   . Diabetes mellitus   . False positive stress test 03/2018   Cath showed normal coronaries with EF 50%  . GERD (gastroesophageal reflux disease)   .  Hyperlipidemia   . Hypertension   . Hypogonadism, male 05/2015  . Increased prostate specific antigen (PSA) velocity 2019   06/2017 velocity up; recheck 10/2017 back to baseline.  Plan repeat 1 yr.  . Low back pain 2016   Left lumbar radiculopathy, lumbar spondylolisthesis.  Sciatica episode 03/2016.  Ortho getting L spine MRI as of 07/2016.  . Microcytic anemia 12/2015   suspect iron def  due to malabsorption.  Hemoccults NEG 01/26/16, ferrous sulfate started.  . Neuropathic pain of left foot   . NICM (nonischemic cardiomyopathy) ([place]) 05/2018   EF 40-45%, +LV hypokinesis. Suspected to be tachycardia-mediated->Dr. Stanford Breed to rpt echo fall 2020.  Marland Kitchen OSA on CPAP    cpap settings at 3  . Peyronie's disease 2021   Dr. Porfirio Oar (collegenase intralesional injections)   Past Surgical History:  Procedure Laterality Date  . A-FLUTTER ABLATION N/A 07/09/2018   Procedure: A-FLUTTER ABLATION;  Surgeon: Evans Lance, MD;  Location: [facility];  Service: Cardiovascular;  Laterality: N/A;  . ADENOIDECTOMY    . BREATH TEK H PYLORI N/A 01/08/2013   Procedure: [place];  Surgeon: [name], MD;  Location: [facility];  Service: General;  Laterality: N/A;  . CARDIAC CATHETERIZATION  03/2018   No signi obstructive CAD (suspected FALSE POSITIVE STRESS TEST).  EF 50%.  Diastolic dysfunction.  Marland Kitchen CARDIOVASCULAR STRESS TEST  03/2018   Intermediate risk; EF 40%, medium sized defect with peri-infarct ischemia-->follow up cath showed no signif obstructive CAD.  Marland Kitchen COLONOSCOPY  04/17/2014   Benign polyp x 1.  Recall 10 yrs.  Marland Kitchen GASTRIC ROUX-EN-Y N/A 04/14/2013   Procedure: LAPAROSCOPIC ROUX-EN-Y GASTRIC BYPASS WITH UPPER ENDOSCOPY;  Surgeon: [name], MD;  Location: [facility];  Service: General;  Laterality: N/A;  . LEFT HEART CATH AND CORONARY ANGIOGRAPHY N/A 03/22/2018   NO CAD.  EF 50%. Procedure: LEFT HEART CATH AND CORONARY ANGIOGRAPHY;  Surgeon: Belva Crome, MD;  Location: [facility];  Service: Cardiovascular;  Laterality: N/A;  . Rhythm monitoring  01/08/2019   7 day monitor->Sinus rhythm with occasional PAC, PVC, paroxysmal atrial flutter, paroxysmal atrial fibrillation and 7 beats nonsustained ventricular tachycardia: xarelto restarted  . TONSILLECTOMY    . TRANSTHORACIC ECHOCARDIOGRAM  05/13/2018;12/03/18   05/2018 severe hypokinesis of LV inferior  and inferolateral walls, EF 40-45%, correlates with stress test.  [id number], diastolic fxn/LV filling pressure not determined due to pt in a fib/flutter     Current Outpatient Medications  Medication Sig Dispense Refill  . ALPRAZolam (XANAX) 0.5 MG tablet TAKE 1 TABLET BY MOUTH EVERY 8 HOURS AS NEEDED FOR SLEEP OR ANXIETY (Patient taking differently: Take 0.5 mg by mouth every 8 (eight) hours as needed for anxiety or sleep. TAKE 1 TABLET BY MOUTH EVERY 8 HOURS AS NEEDED FOR SLEEP OR ANXIET) 90 tablet 5  . amLODipine (NORVASC) 10 MG tablet Take 1 tablet (10 mg total) by mouth daily. 90 tablet 0  . atorvastatin (LIPITOR) 40 MG tablet Take 1 tablet (40 mg total) by mouth daily. 90 tablet 3  . benazepril (LOTENSIN) 40 MG tablet Take 1 tablet by mouth once daily 30 tablet 0  . empagliflozin (JARDIANCE) 10 MG TABS tablet Take 10 mg by mouth daily before breakfast. 30 tablet 2  . furosemide (LASIX) 40 MG tablet Take 1 tablet by mouth once daily 90 tablet 0  . glipiZIDE (GLUCOTROL XL) 10 MG 24 hr tablet Take 1 tablet (10 mg total) by mouth  daily with breakfast. 90 tablet 0  . Insulin Pen Needle 31G X 5 MM MISC Use with Victoza as directed 100 each 11  . liraglutide (VICTOZA) 18 MG/3ML SOPN INJECT 1.2MG  SUBCUTANEOUSLY ONCE DAILY 15 mL 6  . metFORMIN (GLUCOPHAGE) 1000 MG tablet TAKE 1 TABLET BY MOUTH TWICE DAILY WITH A MEAL 180 tablet 3  . pregabalin (LYRICA) 150 MG capsule Take 1 capsule by mouth twice daily 60 capsule 5  . rivaroxaban (XARELTO) 20 MG TABS tablet Take 1 tablet (20 mg total) by mouth daily with supper. 30 tablet   . tamsulosin (FLOMAX) 0.4 MG CAPS capsule Take 0.4 mg by mouth.    . testosterone cypionate (DEPOTESTOSTERONE CYPIONATE) 200 MG/ML injection 1 ml IM q 14 days 10 mL 0  . zaleplon (SONATA) 5 MG capsule TAKE 1 CAPSULE BY MOUTH AT BEDTIME AS NEEDED FOR SLEEP 90 capsule 1  . carvedilol (COREG) 25 MG tablet Take 1 tablet (25 mg total) by mouth 2 (two) times daily. 60 tablet  6  . hydrochlorothiazide (HYDRODIURIL) 25 MG tablet Take 1 tablet (25 mg total) by mouth daily. (Patient not taking: Reported on 04/15/2019) 90 tablet 3   No current facility-administered medications for this encounter.    Allergies:   Patient has no known allergies.   Social History:  The patient  reports that he has never smoked. He quit smokeless tobacco use about 9 years ago.  His smokeless tobacco use included chew. He reports current alcohol use of about 18.0 standard drinks of alcohol per week. He reports that he does not use drugs.   Family History:  The patient's  family history includes Arrhythmia in his father; COPD in his father; Heart disease in his father; Prostate cancer in his father.    ROS:  Please see the history of present illness.   All other systems are personally reviewed and negative.   Exam: Well appearing, alert and conversant, regular work of breathing,  good skin color  Recent Labs: 01/08/2019: ALT 23 04/08/2019: B Natriuretic Peptide 84.7 04/10/2019: BUN 19; Creatinine, Ser 1.36; Hemoglobin 9.9; Platelets 181; Potassium 4.0; Sodium 136  personally reviewed    Epic records are reviewed at length today  LHC 03/22/18  Elevated left ventricular end-diastolic pressure, 21 mmHg. EF 50%. Findings consistent with diastolic dysfunction.  Widely patent coronary arteries.  Normal left main  Normal LAD. Ostial first diagonal 60% narrowed.  Normal circumflex  Normal right coronary  False positive myocardial perfusion study related to diaphragm attenuation  Echo 12/03/18 1. Left ventricular ejection fraction, by visual estimation, is 40 to 45%. The left ventricle has mildly decreased function. There is mildly increased left ventricular hypertrophy. 2. Left ventricular diastolic function could not be evaluated. 3. Global right ventricle was not well visualized.The right ventricular size is not well visualized. Right vetricular wall thickness was not  assessed. 4. Left atrial size was normal. 5. Right atrial size was moderately dilated. 6. Mild to moderate aortic valve annular calcification. 7. The mitral valve is normal in structure. No evidence of mitral valve regurgitation. No evidence of mitral stenosis. 8. The tricuspid valve is grossly normal. Tricuspid valve regurgitation is not demonstrated. 9. The aortic valve is normal in structure. Aortic valve regurgitation is not visualized. No evidence of aortic valve sclerosis or stenosis. 10. The pulmonic valve was normal in structure. Pulmonic valve regurgitation is not visualized. 11. The atrial septum is grossly normal.    ASSESSMENT AND PLAN:  1.  Paroxysmal atrial fibrillation/Typical atrial flutter  S/p atrial flutter ablation with Dr Lovena Le 07/09/18. Patient having some episodes of elevated heart rate.  Will plan to resume Coreg 25 mg BID today.  Continue Xarelto 20 mg daily Apple Watch for home monitoring.  This patients CHA2DS2-VASc Score and unadjusted Ischemic Stroke Rate (% per year) is equal to 4.8 % stroke rate/year from a score of 4  Above score calculated as 1 point each if present [CHF, HTN, DM, Vascular=MI/PAD/Aortic Plaque, Age if 65-74, or Male] Above score calculated as 2 points each if present [Age > 75, or Stroke/TIA/TE]  2. Obesity  Body mass index is 40.24 kg/m. Lifestyle modification was discussed and encouraged including regular physical activity and weight reduction.  3. OSA The importance of adequate treatment of sleep apnea was discussed today in order to improve our ability to maintain sinus rhythm long term. Patient reports compliance with CPAP therapy.  4. HTN Stable, med changes as above. Continue to hold HCTZ.   Follow-up with Kerin Ransom as scheduled. AF clinic in 4 months.    Current medicines are reviewed at length with the patient today.   The patient does not have concerns regarding his medicines.  The following changes were  made today:  Resume Coreg.  Labs/ tests ordered today include: none No orders of the defined types were placed in this encounter.   Patient Risk:  after full review of this patients clinical status, I feel that they are at moderate  risk at this time.   Today, I have spent 15 minutes with the patient with telehealth technology discussing the above.    Gwenlyn Perking PA-C 04/15/2019 10:22 AM  [facility] [address] [place], [place] [address] [phone]   I hereby voluntarily request, consent and authorize the [facility] and its employed or contracted physicians, physician assistants, nurse practitioners or other licensed health care professionals (the Practitioner), to provide me with telemedicine health care services (the "Services") as deemed necessary by the treating Practitioner. I acknowledge and consent to receive the Services by the Practitioner via telemedicine. I understand that the telemedicine visit will involve communicating with the Practitioner through live audiovisual communication technology and the disclosure of certain medical information by electronic transmission. I acknowledge that I have been given the opportunity to request an in-person assessment or other available alternative prior to the telemedicine visit and am voluntarily participating in the telemedicine visit.   I understand that I have the right to withhold or withdraw my consent to the use of telemedicine in the course of my care at any time, without affecting my right to future care or treatment, and that the Practitioner or I may terminate the telemedicine visit at any time. I understand that I have the right to inspect all information obtained and/or recorded in the course of the telemedicine visit and may receive copies of available information for a reasonable fee.  I understand that some of the potential risks of receiving the Services via telemedicine  include:   Delay or interruption in medical evaluation due to technological equipment failure or disruption;  Information transmitted may not be sufficient (e.g. poor resolution of images) to allow for appropriate medical decision making by the Practitioner; and/or  In rare instances, security protocols could fail, causing a breach of personal health information.   Furthermore, I acknowledge that it is my responsibility to provide information about my medical history, conditions and care that is complete and accurate to the best of my ability. I  acknowledge that Practitioner's advice, recommendations, and/or decision may be based on factors not within their control, such as incomplete or inaccurate data provided by me or distortions of diagnostic images or specimens that may result from electronic transmissions. I understand that the practice of medicine is not an exact science and that Practitioner makes no warranties or guarantees regarding treatment outcomes. I acknowledge that I will receive a copy of this consent concurrently upon execution via email to the email address I last provided but may also request a printed copy by calling the office of the [facility].  I understand that my insurance will be billed for this visit.   I have read or had this consent read to me.  I understand the contents of this consent, which adequately explains the benefits and risks of the Services being provided via telemedicine.  I have been provided ample opportunity to ask questions regarding this consent and the Services and have had my questions answered to my satisfaction.  I give my informed consent for the services to be provided through the use of telemedicine in my medical care  By participating in this telemedicine visit I agree to the above.

## 2019-04-17 ENCOUNTER — Other Ambulatory Visit: Payer: Self-pay

## 2019-04-17 ENCOUNTER — Ambulatory Visit (INDEPENDENT_AMBULATORY_CARE_PROVIDER_SITE_OTHER): Payer: Commercial Managed Care - PPO | Admitting: Cardiology

## 2019-04-17 ENCOUNTER — Encounter: Payer: Self-pay | Admitting: Cardiology

## 2019-04-17 VITALS — BP 112/70 | HR 65 | Ht 73.0 in | Wt 304.6 lb

## 2019-04-17 DIAGNOSIS — E119 Type 2 diabetes mellitus without complications: Secondary | ICD-10-CM

## 2019-04-17 DIAGNOSIS — I4892 Unspecified atrial flutter: Secondary | ICD-10-CM | POA: Diagnosis not present

## 2019-04-17 DIAGNOSIS — I2510 Atherosclerotic heart disease of native coronary artery without angina pectoris: Secondary | ICD-10-CM

## 2019-04-17 DIAGNOSIS — Z7901 Long term (current) use of anticoagulants: Secondary | ICD-10-CM

## 2019-04-17 DIAGNOSIS — I428 Other cardiomyopathies: Secondary | ICD-10-CM

## 2019-04-17 DIAGNOSIS — I480 Paroxysmal atrial fibrillation: Secondary | ICD-10-CM

## 2019-04-17 DIAGNOSIS — I10 Essential (primary) hypertension: Secondary | ICD-10-CM | POA: Diagnosis not present

## 2019-04-17 DIAGNOSIS — G4730 Sleep apnea, unspecified: Secondary | ICD-10-CM

## 2019-04-17 DIAGNOSIS — Z794 Long term (current) use of insulin: Secondary | ICD-10-CM

## 2019-04-17 NOTE — Assessment & Plan Note (Signed)
60% small Dx1- no other CAD at cath March 2020

## 2019-04-17 NOTE — Assessment & Plan Note (Signed)
Obesity has been a decades long problem, unresponsive to multiple dietary, weight loss programs, and gastric bypass. BMI 40.

## 2019-04-17 NOTE — Patient Instructions (Signed)
Medication Instructions:  Your physician recommends that you continue on your current medications as directed. Please refer to the Current Medication list given to you today.  *If you need a refill on your cardiac medications before your next appointment, please call your pharmacy*    Follow-Up: At [facility], you and your health needs are our priority.  As part of our continuing mission to provide you with exceptional heart care, we have created designated Provider Care Teams.  These Care Teams include your primary Cardiologist (physician) and Advanced Practice Providers (APPs -  Physician Assistants and Nurse Practitioners) who all work together to provide you with the care you need, when you need it.  We recommend signing up for the patient portal called "MyChart".  Sign up information is provided on this After Visit Summary.  MyChart is used to connect with patients for Virtual Visits (Telemedicine).  Patients are able to view lab/test results, encounter notes, upcoming appointments, etc.  Non-urgent messages can be sent to your provider as well.   To learn more about what you can do with MyChart, go to [email or web address].    Your next appointment:   2 week(s)  The format for your next appointment:   In Person  Provider:   [facility]--Clint Fenton, PA   Other Instructions We have sent a message to our Sleep Coordinator, Barry Brunner, CMA to get you set up with a new sleep clinic provider.

## 2019-04-17 NOTE — Assessment & Plan Note (Signed)
On C-pap 

## 2019-04-17 NOTE — Assessment & Plan Note (Signed)
Pt's main complaint is intermittent tachycardia noted on his Apple watch.  Its not clear that he is symptomatic but he does say his HR can be > 100 "a few days". He recently had a mix up in his medications and was taking metoprolol and Coreg.  He went to the ED hypotensive. Both were held but Coreg recently resumed.

## 2019-04-17 NOTE — Assessment & Plan Note (Addendum)
S/P RFA 07/11/2018- back in A-flutter today with VR 87

## 2019-04-17 NOTE — Progress Notes (Signed)
Cardiology Office Note:    Date:  04/17/2019   ID:  Ethan Pitts, DOB 04/16/1960, MRN [id number]  PCP:  Ethan Sou, MD  Cardiologist:  Dr Ethan Pitts Electrophysiologist:  Dr Ethan Pitts  Referring MD: Ethan Sou, MD   CC; tachycardia  History of Present Illness:    Ethan Pitts is a 59 y.o. male with a hx of PAF and atrial flutter.  He had an ablation in July 2020.  Other medical issues include obesity with a BMI of 40, sleep apnea on CPAP, insulin-dependent diabetes, hypertension, mild NICM with an EF of 40,   to 45% in December 2020,  possibly from atrial flutter and  CAD with a 60% Dx. in January 2021 he was diagnosed with PAF.  Xarelto was added.  He was recently seen in the emergency room with dizziness and hypotension.  He apparently got his medications mixed up and was taking both carvedilol and metoprolol.  Both medications were stopped.  He was seen in the A. fib clinic 04/15/2019 and his carvedilol was resumed.  He is seen today in the office for follow-up.  He says at times his heart rate is fast based on his watch.  He says it can be 110 and can last that way "for days".  Today in the office his heart rate when I examined him was over 100.  An EKG in the office today reveals atrial flutter with a ventricular response of 87.  It is a little hard to determine if he is symptomatic with this or not.  He does have some generalized weakness but it is vague.  He did have cardiomyopathy in December 2020 by his echo which noted it was possibly related to atrial flutter.  Past Medical History:  Diagnosis Date  . Anxiety   . Arthritis    lower back   . Atrial flutter ([place]) 2020   Cards->lopressor and anticoag, ablation. In sinus still as of 08/2018 cards f/u.  Recurrence + PAF->requiring pt to get back on xarelto  . BPH with obstruction/lower urinary tract symptoms    started flomax via urol 03/2019  . CAD (coronary artery disease) 03/2018   Ostial first diagonal  60% narrowed, otherwise normal coronaries. Dr. Stanford Pitts added ASA and statin 08/2018.  Marland Kitchen Depression   . Diabetes mellitus   . False positive stress test 03/2018   Cath showed normal coronaries with EF 50%  . GERD (gastroesophageal reflux disease)   . Hyperlipidemia   . Hypertension   . Hypogonadism, male 05/2015  . Increased prostate specific antigen (PSA) velocity 2019   06/2017 velocity up; recheck 10/2017 back to baseline.  Plan repeat 1 yr.  . Low back pain 2016   Left lumbar radiculopathy, lumbar spondylolisthesis.  Sciatica episode 03/2016.  Ortho getting L spine MRI as of 07/2016.  . Microcytic anemia 12/2015   suspect iron def due to malabsorption.  Hemoccults NEG 01/26/16, ferrous sulfate started.  . Neuropathic pain of left foot   . NICM (nonischemic cardiomyopathy) ([place]) 05/2018   EF 40-45%, +LV hypokinesis. Suspected to be tachycardia-mediated->Dr. Stanford Pitts to rpt echo fall 2020.  Marland Kitchen OSA on CPAP    cpap settings at 3  . Peyronie's disease 2021   Dr. Porfirio Oar (collegenase intralesional injections)    Past Surgical History:  Procedure Laterality Date  . A-FLUTTER ABLATION N/A 07/09/2018   Procedure: A-FLUTTER ABLATION;  Surgeon: Evans Lance, MD;  Location: [facility];  Service: Cardiovascular;  Laterality: N/A;  .  ADENOIDECTOMY    . BREATH TEK H PYLORI N/A 01/08/2013   Procedure: [place];  Surgeon: Ethan Earls, MD;  Location: [facility];  Service: General;  Laterality: N/A;  . CARDIAC CATHETERIZATION  03/2018   No signi obstructive CAD (suspected FALSE POSITIVE STRESS TEST).  EF 50%.  Diastolic dysfunction.  Marland Kitchen CARDIOVASCULAR STRESS TEST  03/2018   Intermediate risk; EF 40%, medium sized defect with peri-infarct ischemia-->follow up cath showed no signif obstructive CAD.  Marland Kitchen COLONOSCOPY  04/17/2014   Benign polyp x 1.  Recall 10 yrs.  Marland Kitchen GASTRIC ROUX-EN-Y N/A 04/14/2013   Procedure: LAPAROSCOPIC ROUX-EN-Y GASTRIC BYPASS WITH UPPER ENDOSCOPY;   Surgeon: Ethan Earls, MD;  Location: [facility];  Service: General;  Laterality: N/A;  . LEFT HEART CATH AND CORONARY ANGIOGRAPHY N/A 03/22/2018   NO CAD.  EF 50%. Procedure: LEFT HEART CATH AND CORONARY ANGIOGRAPHY;  Surgeon: Ethan Crome, MD;  Location: [facility];  Service: Cardiovascular;  Laterality: N/A;  . Rhythm monitoring  01/08/2019   7 day monitor->Sinus rhythm with occasional PAC, PVC, paroxysmal atrial flutter, paroxysmal atrial fibrillation and 7 beats nonsustained ventricular tachycardia: xarelto restarted  . TONSILLECTOMY    . TRANSTHORACIC ECHOCARDIOGRAM  05/13/2018;12/03/18   05/2018 severe hypokinesis of LV inferior and inferolateral walls, EF 40-45%, correlates with stress test.  [id number], diastolic fxn/LV filling pressure not determined due to pt in a fib/flutter    Current Medications: Current Meds  Medication Sig  . ALPRAZolam (XANAX) 0.5 MG tablet TAKE 1 TABLET BY MOUTH EVERY 8 HOURS AS NEEDED FOR SLEEP OR ANXIETY (Patient taking differently: Take 0.5 mg by mouth every 8 (eight) hours as needed for anxiety or sleep. )  . amLODipine (NORVASC) 10 MG tablet Take 1 tablet (10 mg total) by mouth daily.  . benazepril (LOTENSIN) 40 MG tablet Take 1 tablet by mouth once daily  . carvedilol (COREG) 25 MG tablet Take 1 tablet (25 mg total) by mouth 2 (two) times daily.  . empagliflozin (JARDIANCE) 10 MG TABS tablet Take 10 mg by mouth daily before breakfast.  . furosemide (LASIX) 40 MG tablet Take 1 tablet by mouth once daily  . glipiZIDE (GLUCOTROL XL) 10 MG 24 hr tablet Take 1 tablet (10 mg total) by mouth daily with breakfast.  . hydrochlorothiazide (HYDRODIURIL) 25 MG tablet Take 1 tablet (25 mg total) by mouth daily.  . Insulin Pen Needle 31G X 5 MM MISC Use with Victoza as directed  . liraglutide (VICTOZA) 18 MG/3ML SOPN INJECT 1.2MG  SUBCUTANEOUSLY ONCE DAILY  . metFORMIN (GLUCOPHAGE) 1000 MG tablet TAKE 1 TABLET BY MOUTH TWICE DAILY WITH A MEAL  .  pregabalin (LYRICA) 150 MG capsule Take 1 capsule by mouth twice daily  . rivaroxaban (XARELTO) 20 MG TABS tablet Take 1 tablet (20 mg total) by mouth daily with supper.  . tamsulosin (FLOMAX) 0.4 MG CAPS capsule Take 0.4 mg by mouth.  . testosterone cypionate (DEPOTESTOSTERONE CYPIONATE) 200 MG/ML injection 1 ml IM q 14 days  . zaleplon (SONATA) 5 MG capsule TAKE 1 CAPSULE BY MOUTH AT BEDTIME AS NEEDED FOR SLEEP     Allergies:   Patient has no known allergies.   Social History   Socioeconomic History  . Marital status: Divorced    Spouse name: Not on file  . Number of children: Not on file  . Years of education: Not on file  . Highest education level: Not on file  Occupational History  . Occupation: Paramedic-EMT  Employer: [employer]  Tobacco Use  . Smoking status: Never Smoker  . Smokeless tobacco: Former Systems developer    Types: Chew  Substance and Sexual Activity  . Alcohol use: Yes    Alcohol/week: 18.0 standard drinks    Types: 18 Cans of beer per week    Comment: 18  . Drug use: No  . Sexual activity: Not on file  Other Topics Concern  . Not on file  Social History Narrative   Married, no children.   Occupation: paramedic   Social Determinants of Radio broadcast assistant Strain:   . Difficulty of Paying Living Expenses:   Food Insecurity:   . Worried About Charity fundraiser in the Last Year:   . Arboriculturist in the Last Year:   Transportation Needs:   . Film/video editor (Medical):   Marland Kitchen Lack of Transportation (Non-Medical):   Physical Activity:   . Days of Exercise per Week:   . Minutes of Exercise per Session:   Stress:   . Feeling of Stress :   Social Connections:   . Frequency of Communication with Friends and Family:   . Frequency of Social Gatherings with Friends and Family:   . Attends Religious Services:   . Active Member of Clubs or Organizations:   . Attends Archivist Meetings:   Marland Kitchen Marital Status:      Family  History: The patient's family history includes Arrhythmia in his father; COPD in his father; Heart disease in his father; Prostate cancer in his father. There is no history of Colon cancer.  ROS:   Please see the history of present illness.     All other systems reviewed and are negative.  EKGs/Labs/Other Studies Reviewed:    The following studies were reviewed today: Echo Dec 2020  EKG:  EKG is ordered today.  The ekg ordered today demonstrates A-flutter with VR 87  Recent Labs: 01/08/2019: ALT 23 04/08/2019: B Natriuretic Peptide 84.7 04/10/2019: BUN 19; Creatinine, Ser 1.36; Hemoglobin 9.9; Platelets 181; Potassium 4.0; Sodium 136  Recent Lipid Panel    Component Value Date/Time   CHOL 90 01/08/2019 1005   TRIG 132.0 01/08/2019 1005   HDL 31.10 (L) 01/08/2019 1005   CHOLHDL 3 01/08/2019 1005   VLDL 26.4 01/08/2019 1005   LDLCALC 32 01/08/2019 1005   LDLCALC 39 02/23/2017 1636   LDLDIRECT 75.0 06/23/2016 0941    Physical Exam:    VS:  BP 112/70   Pulse 65   Ht 6\' 1"  (1.854 m)   Wt (!) 304 lb 9.6 oz (138.2 kg)   SpO2 97%   BMI 40.19 kg/m     Wt Readings from Last 3 Encounters:  04/17/19 (!) 304 lb 9.6 oz (138.2 kg)  04/15/19 (!) 305 lb (138.3 kg)  04/08/19 (!) 305 lb (138.3 kg)     GEN:  Obese male, well developed in no acute distress HEENT: Normal NECK: No JVD; No carotid bruits CARDIAC: irregularly irregular, no murmurs, rubs, gallops RESPIRATORY:  Clear to auscultation without rales, wheezing or rhonchi  ABDOMEN: Soft, non-tender, non-distended MUSCULOSKELETAL:  No edema; No deformity  SKIN: Warm and dry NEUROLOGIC:  Alert and oriented x 3 PSYCHIATRIC:  Normal affect   ASSESSMENT:    Paroxysmal atrial fibrillation (HCC) Pt's main complaint is intermittent tachycardia noted on his Apple watch.  Its not clear that he is symptomatic but he does say his HR can be > 100 "a few days". He recently  had a mix up in his medications and was taking metoprolol and Coreg.   He went to the ED hypotensive. Both were held but Coreg recently resumed.   Atrial flutter ([place]) S/P RFA 07/11/2018- back in A-flutter today with VR 87  Essential hypertension B/P by me with regular cuff- 110/64  Morbid obesity ([place]) Obesity has been a decades long problem, unresponsive to multiple dietary, weight loss programs, and gastric bypass. BMI 40.  NICM (nonischemic cardiomyopathy) ([place]) EF 40-45% by echo 12/03/2018  DM type 2 (diabetes mellitus, type 2) ([place]) Treated by Dr Loanne Drilling Current meds: Victoza and Jardiance  CAD (coronary artery disease) 60% small Dx1- no other CAD at cath March 2020  Sleep apnea in adult On C-pap  PLAN:    Im going to refer him back to the AF clinic.  I wonder if Tikosyn would be an option.  He is mildly symptomatic but he does seem to have uncontrolled rates and a cardiomyopathy.    Medication Adjustments/Labs and Tests Ordered: Current medicines are reviewed at length with the patient today.  Concerns regarding medicines are outlined above.  No orders of the defined types were placed in this encounter.  No orders of the defined types were placed in this encounter.   There are no Patient Instructions on file for this visit.   Signed, Kerin Ransom, PA-C  04/17/2019 9:12 AM    [place]

## 2019-04-17 NOTE — Assessment & Plan Note (Signed)
B/P by me with regular cuff- 110/64

## 2019-04-17 NOTE — Assessment & Plan Note (Signed)
Treated by Dr Loanne Drilling Current meds: Victoza and Vania Rea

## 2019-04-17 NOTE — Assessment & Plan Note (Signed)
EF 40-45% by echo 12/03/2018

## 2019-04-23 ENCOUNTER — Other Ambulatory Visit: Payer: Self-pay | Admitting: Family Medicine

## 2019-05-01 ENCOUNTER — Other Ambulatory Visit: Payer: Self-pay

## 2019-05-01 ENCOUNTER — Encounter (HOSPITAL_COMMUNITY): Payer: Self-pay | Admitting: Physician Assistant

## 2019-05-01 ENCOUNTER — Ambulatory Visit (HOSPITAL_COMMUNITY)
Admission: RE | Admit: 2019-05-01 | Discharge: 2019-05-01 | Disposition: A | Discharge status: Home / Self Care | Payer: Commercial Managed Care - PPO | Source: Ambulatory Visit | Attending: Physician Assistant | Admitting: Physician Assistant

## 2019-05-01 VITALS — BP 94/60 | HR 72 | Ht 73.0 in | Wt 300.2 lb

## 2019-05-01 DIAGNOSIS — Z01818 Encounter for other preprocedural examination: Secondary | ICD-10-CM | POA: Diagnosis not present

## 2019-05-01 DIAGNOSIS — F419 Anxiety disorder, unspecified: Secondary | ICD-10-CM | POA: Diagnosis not present

## 2019-05-01 DIAGNOSIS — Z6839 Body mass index (BMI) 39.0-39.9, adult: Secondary | ICD-10-CM | POA: Diagnosis not present

## 2019-05-01 DIAGNOSIS — E669 Obesity, unspecified: Secondary | ICD-10-CM | POA: Insufficient documentation

## 2019-05-01 DIAGNOSIS — Z7901 Long term (current) use of anticoagulants: Secondary | ICD-10-CM | POA: Insufficient documentation

## 2019-05-01 DIAGNOSIS — E785 Hyperlipidemia, unspecified: Secondary | ICD-10-CM | POA: Diagnosis not present

## 2019-05-01 DIAGNOSIS — I4892 Unspecified atrial flutter: Secondary | ICD-10-CM | POA: Diagnosis not present

## 2019-05-01 DIAGNOSIS — I2510 Atherosclerotic heart disease of native coronary artery without angina pectoris: Secondary | ICD-10-CM | POA: Diagnosis not present

## 2019-05-01 DIAGNOSIS — Z794 Long term (current) use of insulin: Secondary | ICD-10-CM | POA: Diagnosis not present

## 2019-05-01 DIAGNOSIS — G4733 Obstructive sleep apnea (adult) (pediatric): Secondary | ICD-10-CM | POA: Diagnosis not present

## 2019-05-01 DIAGNOSIS — E118 Type 2 diabetes mellitus with unspecified complications: Secondary | ICD-10-CM | POA: Diagnosis not present

## 2019-05-01 DIAGNOSIS — Z8249 Family history of ischemic heart disease and other diseases of the circulatory system: Secondary | ICD-10-CM | POA: Insufficient documentation

## 2019-05-01 DIAGNOSIS — Z79899 Other long term (current) drug therapy: Secondary | ICD-10-CM | POA: Insufficient documentation

## 2019-05-01 DIAGNOSIS — I480 Paroxysmal atrial fibrillation: Secondary | ICD-10-CM | POA: Insufficient documentation

## 2019-05-01 DIAGNOSIS — D6869 Other thrombophilia: Secondary | ICD-10-CM | POA: Diagnosis not present

## 2019-05-01 DIAGNOSIS — F329 Major depressive disorder, single episode, unspecified: Secondary | ICD-10-CM | POA: Insufficient documentation

## 2019-05-01 DIAGNOSIS — Z20822 Contact with and (suspected) exposure to covid-19: Secondary | ICD-10-CM | POA: Insufficient documentation

## 2019-05-01 DIAGNOSIS — I10 Essential (primary) hypertension: Secondary | ICD-10-CM | POA: Insufficient documentation

## 2019-05-01 LAB — MAGNESIUM: Magnesium: 1.9 mg/dL (ref 1.7–2.4)

## 2019-05-01 NOTE — Progress Notes (Signed)
Primary Care Physician: Tammi Sou, MD  Referring Physician: [facility] Primary Cardiologist: Dr Stanford Breed Primary EP: Dr Cleda Mccreedy is a 59 y.o. male with a history of atrial flutter, OSA, HTN, DM who presents for follow up in the [facility].  The patient was initially diagnosed with atrial flutter 02/03/18 when his Apple Watch showed that he had an irregular rhythm. 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 He had no awareness of his arrhythmia. Patient is s/p atrial flutter ablation 07/09/18 with Dr Lovena Le. Zio patch 12/2018 showed 35% afib burden with rapid rates at times. He was started back on Xarelto for a CHADS2VASC score of 4 by Dr Stanford Breed. Patient was seen at the ER 04/08/19 for hypotension. Patient states he was inadvertently taking metoprolol and carvedilol together. He was also found to be in afib but converted to SR after fluid bolus. Since stopping both carvedilol and metoprolol, he has noted some heart rates up to 110s. Prior to this, he states his afib has been well controlled with few episodes on his Apple Watch and always rate controlled. On follow up with Kerin Ransom, patient was in rate controlled atrial flutter but with several days of elevated heart rates on his Apple Watch.   On follow up today, patient reports he has done well since his last visit. He continues to note periods of afib on his Apple Watch which have been rate controlled. He also reports that he is in afib more often after a night shift at work. He does not feel when he is in afib. He is in SR today.  Today, he denies symptoms of palpitations, chest pain, SOB, orthopnea, PND, lower extremity edema, dizziness, presyncope, syncope, snoring, daytime somnolence, bleeding, or neurologic sequela. The patient is tolerating medications without difficulties and is otherwise  without complaint today.    Atrial Fibrillation Risk Factors:  he does have symptoms or diagnosis of sleep apnea. he is compliant with CPAP therapy. he does not have a history of rheumatic fever. he does not have a history of significant alcohol use.  he has a BMI of Body mass index is 39.61 kg/m.Marland Kitchen Filed Weights   05/01/19 0900  Weight: (!) 136.2 kg     Atrial Fibrillation Management history:  Previous antiarrhythmic drugs: none Previous cardioversions: none Previous ablations: 07/09/18 flutter CHADS2VASC score: 4 (DM, HTN, CAD, CM) Anticoagulation history: Xarelto   Past Medical History:  Diagnosis Date  . Anxiety   . Arthritis    lower back   . Atrial flutter ([place]) 2020   Cards->lopressor and anticoag, ablation. In sinus still as of 08/2018 cards f/u.  Recurrence + PAF->requiring pt to get back on xarelto  . BPH with obstruction/lower urinary tract symptoms    started flomax via urol 03/2019  . CAD (coronary artery disease) 03/2018   Ostial first diagonal 60% narrowed, otherwise normal coronaries. Dr. Stanford Breed added ASA and statin 08/2018.  Marland Kitchen Depression   . Diabetes mellitus   . False positive stress test 03/2018   Cath showed normal coronaries with EF 50%  . GERD (gastroesophageal reflux disease)   . Hyperlipidemia   . Hypertension   . Hypogonadism, male 05/2015  . Increased prostate specific antigen (PSA) velocity 2019   06/2017 velocity up; recheck 10/2017 back to baseline.  Plan repeat 1 yr.  . Low back pain  2016   Left lumbar radiculopathy, lumbar spondylolisthesis.  Sciatica episode 03/2016.  Ortho getting L spine MRI as of 07/2016.  . Microcytic anemia 12/2015   suspect iron def due to malabsorption.  Hemoccults NEG 01/26/16, ferrous sulfate started.  . Neuropathic pain of left foot   . NICM (nonischemic cardiomyopathy) ([place]) 05/2018   EF 40-45%, +LV hypokinesis. Suspected to be tachycardia-mediated->Dr. Stanford Breed to rpt echo fall 2020.  Marland Kitchen OSA on CPAP    cpap  settings at 3  . Peyronie's disease 2021   Dr. Porfirio Oar (collegenase intralesional injections)   Past Surgical History:  Procedure Laterality Date  . A-FLUTTER ABLATION N/A 07/09/2018   Procedure: A-FLUTTER ABLATION;  Surgeon: Evans Lance, MD;  Location: [facility];  Service: Cardiovascular;  Laterality: N/A;  . ADENOIDECTOMY    . BREATH TEK H PYLORI N/A 01/08/2013   Procedure: [place];  Surgeon: [name], MD;  Location: [facility];  Service: General;  Laterality: N/A;  . CARDIAC CATHETERIZATION  03/2018   No signi obstructive CAD (suspected FALSE POSITIVE STRESS TEST).  EF 50%.  Diastolic dysfunction.  Marland Kitchen CARDIOVASCULAR STRESS TEST  03/2018   Intermediate risk; EF 40%, medium sized defect with peri-infarct ischemia-->follow up cath showed no signif obstructive CAD.  Marland Kitchen COLONOSCOPY  04/17/2014   Benign polyp x 1.  Recall 10 yrs.  Marland Kitchen GASTRIC ROUX-EN-Y N/A 04/14/2013   Procedure: LAPAROSCOPIC ROUX-EN-Y GASTRIC BYPASS WITH UPPER ENDOSCOPY;  Surgeon: [name], MD;  Location: [facility];  Service: General;  Laterality: N/A;  . LEFT HEART CATH AND CORONARY ANGIOGRAPHY N/A 03/22/2018   NO CAD.  EF 50%. Procedure: LEFT HEART CATH AND CORONARY ANGIOGRAPHY;  Surgeon: Belva Crome, MD;  Location: [facility];  Service: Cardiovascular;  Laterality: N/A;  . Rhythm monitoring  01/08/2019   7 day monitor->Sinus rhythm with occasional PAC, PVC, paroxysmal atrial flutter, paroxysmal atrial fibrillation and 7 beats nonsustained ventricular tachycardia: xarelto restarted  . TONSILLECTOMY    . TRANSTHORACIC ECHOCARDIOGRAM  05/13/2018;12/03/18   05/2018 severe hypokinesis of LV inferior and inferolateral walls, EF 40-45%, correlates with stress test.  [id number], diastolic fxn/LV filling pressure not determined due to pt in a fib/flutter    Current Outpatient Medications  Medication Sig Dispense Refill  . ALPRAZolam (XANAX) 0.5 MG tablet TAKE 1 TABLET BY  MOUTH EVERY 8 HOURS AS NEEDED FOR SLEEP OR ANXIETY (Patient taking differently: Take 0.5 mg by mouth every 8 (eight) hours as needed for anxiety or sleep. ) 90 tablet 5  . amLODipine (NORVASC) 10 MG tablet Take 1 tablet (10 mg total) by mouth daily. 90 tablet 0  . atorvastatin (LIPITOR) 40 MG tablet Take 1 tablet (40 mg total) by mouth daily. 90 tablet 3  . benazepril (LOTENSIN) 40 MG tablet Take 1 tablet by mouth once daily 30 tablet 0  . carvedilol (COREG) 25 MG tablet Take 1 tablet (25 mg total) by mouth 2 (two) times daily. 60 tablet 6  . furosemide (LASIX) 40 MG tablet Take 1 tablet by mouth once daily 90 tablet 0  . glipiZIDE (GLUCOTROL XL) 10 MG 24 hr tablet Take 1 tablet by mouth once daily with breakfast 30 tablet 0  . Insulin Pen Needle 31G X 5 MM MISC Use with Victoza as directed 100 each 11  . JARDIANCE 10 MG TABS tablet TAKE 1 TABLET BY MOUTH ONCE DAILY BEFORE BREAKFAST 30 tablet 0  . liraglutide (VICTOZA) 18 MG/3ML SOPN INJECT 1.2MG  SUBCUTANEOUSLY  ONCE DAILY 15 mL 6  . metFORMIN (GLUCOPHAGE) 1000 MG tablet TAKE 1 TABLET BY MOUTH TWICE DAILY WITH A MEAL 180 tablet 3  . pregabalin (LYRICA) 150 MG capsule Take 1 capsule by mouth twice daily 60 capsule 5  . rivaroxaban (XARELTO) 20 MG TABS tablet Take 1 tablet (20 mg total) by mouth daily with supper. 30 tablet   . tamsulosin (FLOMAX) 0.4 MG CAPS capsule Take 0.4 mg by mouth.    . testosterone cypionate (DEPOTESTOSTERONE CYPIONATE) 200 MG/ML injection 1 ml IM q 14 days 10 mL 0  . zaleplon (SONATA) 5 MG capsule TAKE 1 CAPSULE BY MOUTH AT BEDTIME AS NEEDED FOR SLEEP 90 capsule 1  . XIAFLEX 0.9 MG SOLR      No current facility-administered medications for this encounter.    No Known Allergies  Social History   Socioeconomic History  . Marital status: Divorced    Spouse name: Not on file  . Number of children: Not on file  . Years of education: Not on file  . Highest education level: Not on file  Occupational History  .  Occupation: Paramedic-EMT    Employer: [employer]  Tobacco Use  . Smoking status: Never Smoker  . Smokeless tobacco: Former Systems developer    Types: Chew  Substance and Sexual Activity  . Alcohol use: Yes    Alcohol/week: 18.0 standard drinks    Types: 18 Cans of beer per week    Comment: 18  . Drug use: No  . Sexual activity: Not on file  Other Topics Concern  . Not on file  Social History Narrative   Married, no children.   Occupation: paramedic   Social Determinants of Radio broadcast assistant Strain:   . Difficulty of Paying Living Expenses:   Food Insecurity:   . Worried About Charity fundraiser in the Last Year:   . Arboriculturist in the Last Year:   Transportation Needs:   . Film/video editor (Medical):   Marland Kitchen Lack of Transportation (Non-Medical):   Physical Activity:   . Days of Exercise per Week:   . Minutes of Exercise per Session:   Stress:   . Feeling of Stress :   Social Connections:   . Frequency of Communication with Friends and Family:   . Frequency of Social Gatherings with Friends and Family:   . Attends Religious Services:   . Active Member of Clubs or Organizations:   . Attends Archivist Meetings:   Marland Kitchen Marital Status:   Intimate Partner Violence:   . Fear of Current or Ex-Partner:   . Emotionally Abused:   Marland Kitchen Physically Abused:   . Sexually Abused:     Family History  Problem Relation Age of Onset  . Heart disease Father        valvular disease  . COPD Father   . Prostate cancer Father   . Arrhythmia Father   . Colon cancer Neg Hx    The patient does not have a history of early familial atrial fibrillation or other arrhythmias.  ROS- All systems are reviewed and negative except as per the HPI above.  Physical Exam: Vitals:   05/01/19 0900  BP: 94/60  Pulse: 72  Weight: (!) 136.2 kg  Height: 6\' 1"  (1.854 m)    GEN- The patient is well appearing obese male, alert and oriented x 3 today.   HEENT-head  normocephalic, atraumatic, sclera clear, conjunctiva pink, hearing intact, trachea midline. Lungs-  Clear to ausculation bilaterally, normal work of breathing Heart- Regular rate and rhythm, no murmurs, rubs or gallops  GI- soft, NT, ND, + BS Extremities- no clubbing, cyanosis, or edema MS- no significant deformity or atrophy Skin- no rash or lesion Psych- euthymic mood, full affect Neuro- strength and sensation are intact   Wt Readings from Last 3 Encounters:  05/01/19 (!) 136.2 kg  04/17/19 (!) 138.2 kg  04/15/19 (!) 138.3 kg    EKG today demonstrates SR HR 72, PR 166, QRS 114, QTc 451 (438 ms manually calculated)  Epic records are reviewed at length today  [facility] 03/22/18  Elevated left ventricular end-diastolic pressure, 21 mmHg. EF 50%. Findings consistent with diastolic dysfunction.  Widely patent coronary arteries.  Normal left main  Normal LAD. Ostial first diagonal 60% narrowed.  Normal circumflex  Normal right coronary  False positive myocardial perfusion study related to diaphragm attenuation  Echo 12/03/18 1. Left ventricular ejection fraction, by visual estimation, is 40 to 45%. The left ventricle has mildly decreased function. There is mildly increased left ventricular hypertrophy.  2. Left ventricular diastolic function could not be evaluated.  3. Global right ventricle was not well visualized.The right ventricular size is not well visualized. Right vetricular wall thickness was not assessed.  4. Left atrial size was normal.  5. Right atrial size was moderately dilated.  6. Mild to moderate aortic valve annular calcification.  7. The mitral valve is normal in structure. No evidence of mitral valve regurgitation. No evidence of mitral stenosis.  8. The tricuspid valve is grossly normal. Tricuspid valve regurgitation is not demonstrated.  9. The aortic valve is normal in structure. Aortic valve regurgitation is not visualized. No evidence of aortic valve  sclerosis or stenosis. 10. The pulmonic valve was normal in structure. Pulmonic valve regurgitation is not visualized. 11. The atrial septum is grossly normal.   Assessment and Plan:  1. Paroxysmal atrial fibrillation/Typical atrial flutter S/p atrial flutter ablation with Dr Lovena Le 07/09/18 Patient continues to have frequent paroxysms of afib. We discussed options for AAD therapy. After discussing the risks and benefits, will plan for dofetilide admission. Patient to check on price of medication. Will forward medication list to pharmacy for review. He states he is not taking HCTZ anymore. Continue Xarelto 20 mg daily Continue Coreg 25 mg BID. Lifestyle changes as below. Apple watch for monitoring of heart rate.   This patients CHA2DS2-VASc Score and unadjusted Ischemic Stroke Rate (% per year) is equal to 4.8 % stroke rate/year from a score of 4  Above score calculated as 1 point each if present [CHF, HTN, DM, Vascular=MI/PAD/Aortic Plaque, Age if 65-74, or Male] Above score calculated as 2 points each if present [Age > 75, or Stroke/TIA/TE]  2. Obesity Body mass index is 39.61 kg/m. Lifestyle modification was discussed and encouraged including regular physical activity and weight reduction.  3. Obstructive sleep apnea Patient compliant with CPAP therapy.  4. HTN Low today but has been in the [id number] systolic at home and work. 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   Follow up in June for dofetilide admission.    [facility] [address] [place], [place] [address] [phone] 05/01/2019 9:41 AM

## 2019-05-02 ENCOUNTER — Other Ambulatory Visit (HOSPITAL_COMMUNITY): Payer: Self-pay | Admitting: *Deleted

## 2019-05-02 ENCOUNTER — Encounter (HOSPITAL_COMMUNITY): Payer: Self-pay

## 2019-05-02 MED ORDER — MAGNESIUM OXIDE 400 (241.3 MG) MG PO TABS: 400.0000 mg | ORAL_TABLET | Freq: Every day | ORAL | 3 refills | Status: DC

## 2019-05-03 ENCOUNTER — Other Ambulatory Visit: Payer: Self-pay | Admitting: Family Medicine

## 2019-05-09 ENCOUNTER — Telehealth (HOSPITAL_COMMUNITY): Payer: Self-pay

## 2019-05-09 NOTE — Telephone Encounter (Signed)
I spoke with Ethan Pitts from [facility] regarding his Tikosyn Preadmission for date of service 06/03/2019. Authorization is pending. Case ID # [id number]

## 2019-05-12 ENCOUNTER — Other Ambulatory Visit: Payer: Self-pay | Admitting: Family Medicine

## 2019-05-13 ENCOUNTER — Telehealth (HOSPITAL_COMMUNITY): Payer: Self-pay | Admitting: *Deleted

## 2019-05-13 ENCOUNTER — Other Ambulatory Visit: Payer: Self-pay | Admitting: Family Medicine

## 2019-05-13 NOTE — Telephone Encounter (Signed)
Patient approved for preauthorization for upcoming inpatient admission for tikosyn loading on 6/1 Ref [id number]

## 2019-05-15 ENCOUNTER — Telehealth: Payer: Self-pay

## 2019-05-15 NOTE — Telephone Encounter (Signed)
Pls dismiss this patient. The rudeness he displays to staff is not to be tolerated. His accusations/complaints are baseless AND he has a pattern of rude speech to staff. Thank you. Signed:  Crissie Sickles, MD           05/15/2019

## 2019-05-15 NOTE — Telephone Encounter (Signed)
Patient was sent mychart message on 5/10 that he was due for yearly physical and reason for medication denial. It shows patient read message that same day. Patient frequently uses mychart to send message to the office as well.

## 2019-05-15 NOTE — Telephone Encounter (Signed)
Patient called in needing a refill on Rx   benazepril (LOTENSIN) 40 MG tablet  [place], [place] - [address]

## 2019-05-15 NOTE — Telephone Encounter (Signed)
Patient was very rude while speaking with him. He blames everyone for him not knowing it was time for his yearly physical. Its our responsibility  to call and never use My chart because patient doesn't check that but every "blue moon". Had he not contacted the pharmacy he would have not known that he couldn't get a refill. That was is irresponsible of the doctor office, and not him.

## 2019-05-19 ENCOUNTER — Encounter: Payer: Self-pay | Admitting: Family Medicine

## 2019-05-19 NOTE — Telephone Encounter (Signed)
LMOM for pt regarding the decision to dismiss him. Advised that letter would be mailed and sent via [employer].

## 2019-05-19 NOTE — Telephone Encounter (Signed)
LMOM for pt that he was being dismissed and why. Also advised that aletter would be sent by mail and by mychart.

## 2019-05-19 NOTE — Progress Notes (Signed)
Agree. Signed today. Signed:  Crissie Sickles, MD           05/19/2019

## 2019-05-20 ENCOUNTER — Other Ambulatory Visit: Payer: Self-pay

## 2019-05-20 ENCOUNTER — Encounter: Payer: Self-pay | Admitting: Family Medicine

## 2019-05-20 MED ORDER — BENAZEPRIL HCL 40 MG PO TABS: 40.0000 mg | ORAL_TABLET | Freq: Every day | ORAL | 0 refills | Status: DC

## 2019-05-23 ENCOUNTER — Other Ambulatory Visit (HOSPITAL_COMMUNITY): Payer: Self-pay | Admitting: Physician Assistant

## 2019-05-23 ENCOUNTER — Other Ambulatory Visit: Payer: Self-pay | Admitting: Family Medicine

## 2019-05-29 ENCOUNTER — Encounter: Payer: Commercial Managed Care - PPO | Admitting: Family Medicine

## 2019-05-30 ENCOUNTER — Other Ambulatory Visit (HOSPITAL_COMMUNITY)
Admission: RE | Admit: 2019-05-30 | Discharge: 2019-05-30 | Disposition: A | Discharge status: Home / Self Care | Payer: Commercial Managed Care - PPO | Source: Ambulatory Visit | Attending: Internal Medicine | Admitting: Internal Medicine

## 2019-05-30 ENCOUNTER — Telehealth (HOSPITAL_COMMUNITY): Payer: Self-pay

## 2019-05-30 ENCOUNTER — Other Ambulatory Visit: Payer: Self-pay

## 2019-05-30 ENCOUNTER — Encounter: Payer: Self-pay | Admitting: Cardiovascular Disease

## 2019-05-30 ENCOUNTER — Ambulatory Visit (INDEPENDENT_AMBULATORY_CARE_PROVIDER_SITE_OTHER): Payer: Commercial Managed Care - PPO | Admitting: Cardiovascular Disease

## 2019-05-30 VITALS — BP 100/61 | HR 108 | Ht 73.0 in | Wt 289.4 lb

## 2019-05-30 DIAGNOSIS — E119 Type 2 diabetes mellitus without complications: Secondary | ICD-10-CM

## 2019-05-30 DIAGNOSIS — Z20822 Contact with and (suspected) exposure to covid-19: Secondary | ICD-10-CM | POA: Insufficient documentation

## 2019-05-30 DIAGNOSIS — Z794 Long term (current) use of insulin: Secondary | ICD-10-CM

## 2019-05-30 DIAGNOSIS — E785 Hyperlipidemia, unspecified: Secondary | ICD-10-CM

## 2019-05-30 DIAGNOSIS — G4733 Obstructive sleep apnea (adult) (pediatric): Secondary | ICD-10-CM | POA: Diagnosis not present

## 2019-05-30 DIAGNOSIS — Z01812 Encounter for preprocedural laboratory examination: Secondary | ICD-10-CM | POA: Insufficient documentation

## 2019-05-30 DIAGNOSIS — I10 Essential (primary) hypertension: Secondary | ICD-10-CM | POA: Diagnosis not present

## 2019-05-30 DIAGNOSIS — Z7901 Long term (current) use of anticoagulants: Secondary | ICD-10-CM

## 2019-05-30 LAB — SARS CORONAVIRUS 2 (TAT 6-24 HRS): SARS Coronavirus 2: NEGATIVE

## 2019-05-30 MED ORDER — AMLODIPINE BESYLATE 5 MG PO TABS: 5.0000 mg | ORAL_TABLET | Freq: Every day | ORAL | 3 refills | Status: DC

## 2019-05-30 NOTE — Patient Instructions (Signed)
Medication Instructions: DECREASE AMLODIPINE TO  5 MG DAILY   *If you need a refill on your cardiac medications before your next appointment, please call your pharmacy*   Follow-Up: At [facility], you and your health needs are our priority.  As part of our continuing mission to provide you with exceptional heart care, we have created designated Provider Care Teams.  These Care Teams include your primary Cardiologist (physician) and Advanced Practice Providers (APPs -  Physician Assistants and Nurse Practitioners) who all work together to provide you with the care you need, when you need it.  We recommend signing up for the patient portal called "MyChart".  Sign up information is provided on this After Visit Summary.  MyChart is used to connect with patients for Virtual Visits (Telemedicine).  Patients are able to view lab/test results, encounter notes, upcoming appointments, etc.  Non-urgent messages can be sent to your provider as well.   To learn more about what you can do with MyChart, go to [email or web address].    Your next appointment:   2 month(s)  The format for your next appointment:   In Person  Provider:   Shelva Majestic, MD

## 2019-05-30 NOTE — Progress Notes (Signed)
Cardiology Office Note    Date:  06/05/2019   ID:  Ethan Pitts, DOB 06/21/1960, MRN [id number]  PCP:  Tammi Sou, MD  Cardiologist:  Shelva Majestic, MD (sleep): Dr. Kirk Ruths  New sleep evaluation  History of Present Illness:  Ethan Pitts is a 59 y.o. male who is followed by Dr. Kirk Ruths with a history of atrial flutter and cardiomyopathy.  He has a history of obstructive sleep apnea and presents to establish care.  Ethan Pitts states that he initially underwent a sleep study in 2009.  On April 03, 2013 he was referred by Dr. Gwenette Greet for a another sleep evaluation which was done at the [facility] sleep disorder center.  This revealed very severe sleep apnea with an AHI of 55.5/h and RDI of 63.2/h.  Oxygen nadir was 83%.  Apparently the patient saw Dr. Mosetta Pigeon in July 2015 and by his report CPAP is working well but at times was using his old machine but had received a new machine.  Ethan Pitts has not seen a sleep physician over the past 60 years.  [facility] was his DME company but he is not use them in many years.  He was unaware that [facility] had been bought out by [employer].  Over the years he has been buying his CPAP masks online.  He has a history of morbid obesity and remotely had undergone Roux-en-Y gastric bypass surgery with weight reduction from 335 down to 268 but subsequently weight has again increased up to 289.  Presently, he admits to daytime sleepiness I calculated a new Epworth Sleepiness Scale score which endorsed at 14 as shown below.  Epworth Sleepiness Scale: Situation   Chance of Dozing/Sleeping (0 = never , 1 = slight chance , 2 = moderate chance , 3 = high chance )   sitting and reading 3   watching TV 2   sitting inactive in a public place 2   being a passenger in a motor vehicle for an hour or more 2   lying down in the afternoon 3   sitting and talking to someone 1   sitting quietly after lunch (no  alcohol) 2   while stopped for a few minutes in traffic as the driver 0   Total Score  15     He has a history of paroxysmal atrial fibrillation and atrial flutter.  He has a history of type 2 diabetes mellitus, hypertension, as well as chronic anxiety with insomnia.  He is now referred for a re-establishment of sleep care and initial evaluation with me.  Past Medical History:  Diagnosis Date   Anxiety    Arthritis    lower back    Atrial flutter ([place]) 2020   Cards->lopressor and anticoag, ablation. In sinus still as of 08/2018 cards f/u.  Recurrence + PAF->requiring pt to get back on xarelto   BPH with obstruction/lower urinary tract symptoms    started flomax via urol 03/2019   CAD (coronary artery disease) 03/2018   Ostial first diagonal 60% narrowed, otherwise normal coronaries. Dr. Stanford Breed added ASA and statin 08/2018.   Depression    Diabetes mellitus    False positive stress test 03/2018   Cath showed normal coronaries with EF 50%   GERD (gastroesophageal reflux disease)    Hyperlipidemia    Hypertension    Hypogonadism, male 05/2015   Increased prostate specific antigen (PSA) velocity 2019   06/2017 velocity up; recheck 10/2017  back to baseline.  Plan repeat 1 yr.   Low back pain 2016   Left lumbar radiculopathy, lumbar spondylolisthesis.  Sciatica episode 03/2016.  Ortho getting L spine MRI as of 07/2016.   Microcytic anemia 12/2015   suspect iron def due to malabsorption.  Hemoccults NEG 01/26/16, ferrous sulfate started.   Neuropathic pain of left foot    NICM (nonischemic cardiomyopathy) ([place]) 05/2018   EF 40-45%, +LV hypokinesis. Suspected to be tachycardia-mediated->Dr. Stanford Breed to rpt echo fall 2020.   OSA on CPAP    cpap settings at 3   Peyronie's disease 2021   Dr. Porfirio Oar (collegenase intralesional injections)    Past Surgical History:  Procedure Laterality Date   A-FLUTTER ABLATION N/A 07/09/2018   Procedure: A-FLUTTER ABLATION;   Surgeon: Evans Lance, MD;  Location: [facility];  Service: Cardiovascular;  Laterality: N/A;   ADENOIDECTOMY     BREATH TEK H PYLORI N/A 01/08/2013   Procedure: [place];  Surgeon: [name], MD;  Location: [facility];  Service: General;  Laterality: N/A;   CARDIAC CATHETERIZATION  03/2018   No signi obstructive CAD (suspected FALSE POSITIVE STRESS TEST).  EF 50%.  Diastolic dysfunction.   CARDIOVASCULAR STRESS TEST  03/2018   Intermediate risk; EF 40%, medium sized defect with peri-infarct ischemia-->follow up cath showed no signif obstructive CAD.   COLONOSCOPY  04/17/2014   Benign polyp x 1.  Recall 10 yrs.   GASTRIC ROUX-EN-Y N/A 04/14/2013   Procedure: LAPAROSCOPIC ROUX-EN-Y GASTRIC BYPASS WITH UPPER ENDOSCOPY;  Surgeon: [name], MD;  Location: [facility];  Service: General;  Laterality: N/A;   LEFT HEART CATH AND CORONARY ANGIOGRAPHY N/A 03/22/2018   NO CAD.  EF 50%. Procedure: LEFT HEART CATH AND CORONARY ANGIOGRAPHY;  Surgeon: Belva Crome, MD;  Location: [facility];  Service: Cardiovascular;  Laterality: N/A;   Rhythm monitoring  01/08/2019   7 day monitor->Sinus rhythm with occasional PAC, PVC, paroxysmal atrial flutter, paroxysmal atrial fibrillation and 7 beats nonsustained ventricular tachycardia: xarelto restarted   TONSILLECTOMY     TRANSTHORACIC ECHOCARDIOGRAM  05/13/2018;12/03/18   05/2018 severe hypokinesis of LV inferior and inferolateral walls, EF 40-45%, correlates with stress test.  68/1157->WI 20-35%, diastolic fxn/LV filling pressure not determined due to pt in a fib/flutter    Current Medications: No facility-administered medications prior to visit.   Outpatient Medications Prior to Visit  Medication Sig Dispense Refill   ALPRAZolam (XANAX) 0.5 MG tablet TAKE 1 TABLET BY MOUTH EVERY 8 HOURS AS NEEDED FOR SLEEP OR ANXIETY (Patient taking differently: Take 0.5 mg by mouth every 8 (eight) hours as needed for anxiety  or sleep. ) 90 tablet 5   atorvastatin (LIPITOR) 40 MG tablet Take 1 tablet (40 mg total) by mouth daily. (Patient taking differently: Take 40 mg by mouth every evening. ) 90 tablet 3   benazepril (LOTENSIN) 40 MG tablet Take 1 tablet (40 mg total) by mouth daily. 30 tablet 0   carvedilol (COREG) 25 MG tablet Take 1 tablet (25 mg total) by mouth 2 (two) times daily. 60 tablet 6   furosemide (LASIX) 40 MG tablet Take 1 tablet by mouth once daily (Patient taking differently: Take 40 mg by mouth daily. ) 90 tablet 0   glipiZIDE (GLUCOTROL XL) 10 MG 24 hr tablet Take 1 tablet by mouth once daily with breakfast (Patient taking differently: Take 10 mg by mouth daily with breakfast. ) 30 tablet 0   Insulin Pen Needle  31G X 5 MM MISC Use with Victoza as directed 100 each 11   JARDIANCE 10 MG TABS tablet TAKE 1 TABLET BY MOUTH ONCE DAILY BEFORE BREAKFAST (Patient taking differently: Take 10 mg by mouth daily before breakfast. ) 30 tablet 0   liraglutide (VICTOZA) 18 MG/3ML SOPN INJECT 1.2MG SUBCUTANEOUSLY ONCE DAILY (Patient taking differently: Inject 1.2 mg into the skin daily. ) 15 mL 6   magnesium oxide (MAG-OX) 400 (241.3 Mg) MG tablet Take 1 tablet (400 mg total) by mouth daily. (Patient taking differently: Take 400 mg by mouth daily in the afternoon. ) 30 tablet 3   metFORMIN (GLUCOPHAGE) 1000 MG tablet TAKE 1 TABLET BY MOUTH TWICE DAILY WITH A MEAL (Patient taking differently: Take 1,000 mg by mouth 2 (two) times daily with a meal. ) 180 tablet 3   pregabalin (LYRICA) 150 MG capsule Take 1 capsule by mouth twice daily (Patient taking differently: Take 150 mg by mouth 2 (two) times daily. ) 60 capsule 5   tamsulosin (FLOMAX) 0.4 MG CAPS capsule Take 0.4 mg by mouth daily in the afternoon.      testosterone cypionate (DEPOTESTOSTERONE CYPIONATE) 200 MG/ML injection 1 ml IM q 14 days (Patient taking differently: Inject 200 mg into the muscle every 14 (fourteen) days. 1 ml) 10 mL 0   XARELTO 20  MG TABS tablet TAKE 1 TABLET BY MOUTH ONCE DAILY WITH  SUPPER (Patient taking differently: Take 20 mg by mouth every morning. ) 90 tablet 2   XIAFLEX 0.9 MG SOLR      zaleplon (SONATA) 5 MG capsule TAKE 1 CAPSULE BY MOUTH AT BEDTIME AS NEEDED FOR SLEEP (Patient taking differently: Take 5 mg by mouth at bedtime as needed for sleep. ) 90 capsule 1   amLODipine (NORVASC) 10 MG tablet Take 1 tablet by mouth once daily 30 tablet 0     Allergies:   Patient has no known allergies.   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EMT    Employer: [employer], [employer]  Tobacco Use   Smoking status: Never Smoker   Smokeless tobacco: Former Systems developer    Types: Chew  Substance and Sexual Activity   Alcohol use: Yes    Alcohol/week: 18.0 standard drinks    Types: 18 Cans of beer per week    Comment: 18   Drug use: No   Sexual activity: Not on file  Other Topics Concern   Not on file  Social History Narrative   Married, no children.   Occupation: paramedic   Social Determinants of Radio broadcast assistant Strain:    Difficulty of Paying Living Expenses:   Food Insecurity:    Worried About Charity fundraiser in the Last Year:    Arboriculturist in the Last Year:   Transportation Needs:    Film/video editor (Medical):    Lack of Transportation (Non-Medical):   Physical Activity:    Days of Exercise per Week:    Minutes of Exercise per Session:   Stress:    Feeling of Stress :   Social Connections:    Frequency of Communication with Friends and Family:    Frequency of Social Gatherings with Friends and Family:    Attends Religious  Services:    Active Member of Clubs or Organizations:    Attends Archivist Meetings:    Marital Status:      Socially he is divorced.  Adopted a 36-year-old.  He is retired from [employer].  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  Family History:  The patient's family history includes Arrhythmia in his father; COPD in his father; Heart disease in his father; Prostate cancer in his father.  His father died at age 29 with pneumonia.  Mother is 17 and living.  He has 1 brother age 25.  ROS General: Negative; No fevers, chills, or night sweats;  HEENT: Negative; No changes in vision or hearing, sinus congestion, difficulty swallowing Pulmonary: Negative; No cough, wheezing, shortness of breath, hemoptysis Cardiovascular: Negative; No chest pain, presyncope, syncope, palpitations GI: Negative; No nausea, vomiting, diarrhea, or abdominal pain GU: Negative; No dysuria, hematuria, or difficulty voiding Musculoskeletal: Negative; no myalgias, joint pain, or weakness Hematologic/Oncology: Negative; no easy bruising, bleeding Endocrine: Negative; no heat/cold intolerance; no diabetes Neuro: Negative; no changes in balance, headaches Skin: Negative; No rashes or skin lesions Psychiatric: Negative; No behavioral problems, depression Sleep: Negative; No snoring, daytime sleepiness, hypersomnolence, bruxism, restless legs, hypnogognic hallucinations, no cataplexy Other comprehensive 14 point system review is negative.   PHYSICAL EXAM:   VS:  BP 100/61    Pulse (!) 108    Ht '6\' 1"'  (1.854 m)    Wt 289 lb 6.4 oz (131.3 kg)    SpO2 97%    BMI 38.18 kg/m     Repeat blood pressure by me was 120/62  Wt Readings from Last 3 Encounters:  06/05/19 295 lb 8 oz (134 kg)  06/03/19 300 lb (136.1 kg)  05/30/19 289 lb 6.4 oz (131.3 kg)    General: Alert, oriented, no distress.  Skin: normal turgor, no rashes, warm and dry HEENT: Normocephalic, atraumatic. Pupils equal round and reactive to light; sclera anicteric; extraocular muscles intact; Nose without nasal septal hypertrophy Mouth/Parynx  benign; Mallinpatti scale 3 Neck: No JVD, no carotid bruits; normal carotid upstroke Lungs: clear to ausculatation and percussion; no wheezing or rales Chest wall: without tenderness to palpitation Heart: PMI not displaced, RRR, s1 s2 normal, 1/6 systolic murmur, no diastolic murmur, no rubs, gallops, thrills, or heaves Abdomen: soft, nontender; no hepatosplenomehaly, BS+; abdominal aorta nontender and not dilated by palpation. Back: no CVA tenderness Pulses 2+ Musculoskeletal: full range of motion, normal strength, no joint deformities Extremities: no clubbing cyanosis or edema, Homan's sign negative  Neurologic: grossly nonfocal; Cranial nerves grossly wnl Psychologic: Normal mood and affect   Studies/Labs Reviewed:   EKG:  EKG is ordered today.  ECG (independently read by me): Atrial flutter with 2:1 block; QTc 453  Recent Labs: BMP Latest Ref Rng & Units 06/05/2019 06/04/2019 06/03/2019  Glucose 70 - 99 mg/dL 108(H) 99 191(H)  BUN 6 - 20 mg/dL '12 12 12  ' Creatinine 0.61 - 1.24 mg/dL 0.93 0.95 0.96  BUN/Creat Ratio 6 - 22 (calc) - - -  Sodium 135 - 145 mmol/L 137 137 137  Potassium 3.5 - 5.1 mmol/L 3.9 4.1 4.7  Chloride 98 - 111 mmol/L 104 106 105  CO2 22 - 32 mmol/L '26 25 23  ' Calcium 8.9 - 10.3 mg/dL 8.4(L) 8.6(L) 9.4     Hepatic Function Latest Ref Rng & Units 01/08/2019 02/03/2018 10/09/2017  Total Protein 6.0 - 8.3 g/dL 7.0 6.7 7.1  Albumin 3.5 - 5.2 g/dL 4.2 3.6  4.2  AST 0 - 37 U/L '22 26 20  ' ALT 0 - 53 U/L '23 27 24  ' Alk Phosphatase 39 - 117 U/L 57 51 52  Total Bilirubin 0.2 - 1.2 mg/dL 0.6 0.6 0.5  Bilirubin, Direct 0.0 - 0.3 mg/dL - - -    CBC Latest Ref Rng & Units 04/10/2019 04/08/2019 01/08/2019  WBC 4.0 - 10.5 K/uL 4.8 5.9 4.6  Hemoglobin 13.0 - 17.0 g/dL 9.9(L) 10.4(L) 14.3  Hematocrit 39.0 - 52.0 % 33.1(L) 33.9(L) 43.0  Platelets 150 - 400 K/uL 181 228 173.0   Lab Results  Component Value Date   MCV 86.2 04/10/2019   MCV 86.0 04/08/2019   MCV 88.7 01/08/2019   Lab  Results  Component Value Date   TSH 0.581 02/03/2018   Lab Results  Component Value Date   HGBA1C 8.3 (H) 01/08/2019     BNP    Component Value Date/Time   BNP 84.7 04/08/2019 2022    ProBNP No results found for: PROBNP   Lipid Panel     Component Value Date/Time   CHOL 90 01/08/2019 1005   TRIG 132.0 01/08/2019 1005   HDL 31.10 (L) 01/08/2019 1005   CHOLHDL 3 01/08/2019 1005   VLDL 26.4 01/08/2019 1005   LDLCALC 32 01/08/2019 1005   LDLCALC 39 02/23/2017 1636   LDLDIRECT 75.0 06/23/2016 0941     RADIOLOGY: No results found.   Additional studies/ records that were reviewed today include:  I have reviewed the [facility] polyp sonographic report from April 03, 2013.  I reviewed the July 02, 2013 record of Dr. Gwenette Greet.  Recent records from Dr. Julien Nordmann were reviewed.  Epworth Sleepiness Scale score was calculated in the office today.  ASSESSMENT:    1. OSA (obstructive sleep apnea)   2. Essential hypertension   3. Morbid obesity ([facility] [place])   4. Type 2 diabetes mellitus without complication, with long-term current use of insulin ([place])   5. Chronic anticoagulation   6. Hyperlipidemia, unspecified hyperlipidemia type     PLAN:  Ethan Pitts is a 58 year old gentleman who has a history of morbid obesity and had undergone prior Roux-en-Y gastric bypass surgery.  He had significant weight reduction from 335 down to 268 but recently has gained approximately 21 pounds back.  He states his initial sleep study was in 2009 he has not seen a sleep physician in over 6 years and has not gotten any supplies from his previous DME company which [facility].  He was unaware that they were brought out by [employer].  Since he does not have any DME company presently I will refer [facility] to [facility] and I will asked that they link his machine to our office so that I may be able to evaluate his compliance as well as CPAP efficacy.  I had a long  discussion with him today and reviewed in detail the adverse consequences of sleep apnea with reference to cardiovascular health as well as its effect on sleep architecture.  His Epworth Sleepiness Scale score is consistent with significant daytime sleepiness and I suspect he may not be optimally treated presently.  I reviewed with him effects of untreated sleep apnea with reference to hypertension control, nocturnal arrhythmias which may have contributed to his history of atrial fibrillation and atrial flutter.  Remotely he had a nonischemic cardiomyopathy but subsequently LV function had improved.  He continues at times to no tachycardic episodes.  I discussed with  him potential effects of nocturnal hypoxemia contributing to both coronary as well as cerebral ischemia and I discussed its effects on glucose metabolism, GERD, and inflammation.  He is diabetic on glipizide Jardiance Metformin in addition to Victoza.  He has been on amlodipine 10 mg but does not take this regularly and has been taking furosemide 40 mg 3 days/week.  His blood pressure today was low and he has been on amlodipine 10 mg, benazepril 40 mg, carvedilol 25 mg twice a day and furosemide which she has been taking 3 days/week.  I have suggested reduction of his amlodipine to 5 mg particularly with his blood pressure 100/61 upon arrival.  I discussed new mask technology and feel he may benefit from the new ResMed N 30i  mask.  I discussed optimal sleep duration at 7 to 8 hours and the importance of using CPAP through the night entirety.  We discussed that typically the severity of sleep apnea is worse during REM sleep and that of upon dreams of REM sleep occurs in the second half of the night.  We will review his download when available to [place].  He is on atorvastatin for hyperlipidemia.  He is on Xarelto for anticoagulation.  I will see him in 2 months for reevaluation and further recommendations will be made at that time.   Medication  Adjustments/Labs and Tests Ordered: Current medicines are reviewed at length with the patient today.  Concerns regarding medicines are outlined above.  Medication changes, Labs and Tests ordered today are listed in the Patient Instructions below. Patient Instructions  Medication Instructions: DECREASE AMLODIPINE TO  5 MG DAILY   *If you need a refill on your cardiac medications before your next appointment, please call your pharmacy*   Follow-Up: At [facility], you and your health needs are our priority.  As part of our continuing mission to provide you with exceptional heart care, we have created designated Provider Care Teams.  These Care Teams include your primary Cardiologist (physician) and Advanced Practice Providers (APPs -  Physician Assistants and Nurse Practitioners) who all work together to provide you with the care you need, when you need it.  We recommend signing up for the patient portal called "MyChart".  Sign up information is provided on this After Visit Summary.  MyChart is used to connect with patients for Virtual Visits (Telemedicine).  Patients are able to view lab/test results, encounter notes, upcoming appointments, etc.  Non-urgent messages can be sent to your provider as well.   To learn more about what you can do with MyChart, go to [email or web address].    Your next appointment:   2 month(s)  The format for your next appointment:   In Person  Provider:   Shelva Majestic, MD       Signed, Shelva Majestic, MD  06/05/2019 4:45 PM    [place] [address], [place], [place], [place]  [address] Phone: [phone]

## 2019-05-30 NOTE — Telephone Encounter (Signed)
Patient was notified regarding Tikosyn coverage. The medication will be a zero dollar copay regardless of the dosage and the quantity. Left message to notify patient.

## 2019-06-03 ENCOUNTER — Encounter (HOSPITAL_COMMUNITY): Payer: Self-pay | Admitting: Internal Medicine

## 2019-06-03 ENCOUNTER — Inpatient Hospital Stay (HOSPITAL_COMMUNITY)
Admission: AD | Admit: 2019-06-03 | Discharge: 2019-06-06 | DRG: 310 | Disposition: A | Discharge status: Home / Self Care | Payer: Commercial Managed Care - PPO | Source: Ambulatory Visit | Attending: Internal Medicine | Admitting: Internal Medicine

## 2019-06-03 ENCOUNTER — Other Ambulatory Visit: Payer: Self-pay

## 2019-06-03 ENCOUNTER — Ambulatory Visit (HOSPITAL_COMMUNITY)
Admission: RE | Admit: 2019-06-03 | Discharge: 2019-06-03 | Disposition: A | Discharge status: Home / Self Care | Payer: Commercial Managed Care - PPO | Source: Ambulatory Visit | Attending: Physician Assistant | Admitting: Physician Assistant

## 2019-06-03 ENCOUNTER — Encounter (HOSPITAL_COMMUNITY): Payer: Self-pay | Admitting: Physician Assistant

## 2019-06-03 ENCOUNTER — Telehealth: Payer: Self-pay | Admitting: Pharmacist

## 2019-06-03 VITALS — BP 124/70 | HR 76 | Ht 73.0 in | Wt 300.0 lb

## 2019-06-03 DIAGNOSIS — Z79899 Other long term (current) drug therapy: Secondary | ICD-10-CM

## 2019-06-03 DIAGNOSIS — Z6839 Body mass index (BMI) 39.0-39.9, adult: Secondary | ICD-10-CM

## 2019-06-03 DIAGNOSIS — I10 Essential (primary) hypertension: Secondary | ICD-10-CM | POA: Diagnosis present

## 2019-06-03 DIAGNOSIS — G4733 Obstructive sleep apnea (adult) (pediatric): Secondary | ICD-10-CM | POA: Diagnosis present

## 2019-06-03 DIAGNOSIS — I480 Paroxysmal atrial fibrillation: Principal | ICD-10-CM | POA: Diagnosis present

## 2019-06-03 DIAGNOSIS — I2510 Atherosclerotic heart disease of native coronary artery without angina pectoris: Secondary | ICD-10-CM | POA: Diagnosis present

## 2019-06-03 DIAGNOSIS — Z7901 Long term (current) use of anticoagulants: Secondary | ICD-10-CM | POA: Diagnosis not present

## 2019-06-03 DIAGNOSIS — Z79890 Hormone replacement therapy (postmenopausal): Secondary | ICD-10-CM

## 2019-06-03 DIAGNOSIS — F419 Anxiety disorder, unspecified: Secondary | ICD-10-CM | POA: Diagnosis present

## 2019-06-03 DIAGNOSIS — E119 Type 2 diabetes mellitus without complications: Secondary | ICD-10-CM | POA: Diagnosis present

## 2019-06-03 DIAGNOSIS — N401 Enlarged prostate with lower urinary tract symptoms: Secondary | ICD-10-CM | POA: Diagnosis present

## 2019-06-03 DIAGNOSIS — Z87891 Personal history of nicotine dependence: Secondary | ICD-10-CM | POA: Diagnosis not present

## 2019-06-03 DIAGNOSIS — I428 Other cardiomyopathies: Secondary | ICD-10-CM | POA: Diagnosis present

## 2019-06-03 DIAGNOSIS — Z8249 Family history of ischemic heart disease and other diseases of the circulatory system: Secondary | ICD-10-CM | POA: Diagnosis not present

## 2019-06-03 DIAGNOSIS — I483 Typical atrial flutter: Secondary | ICD-10-CM | POA: Diagnosis present

## 2019-06-03 DIAGNOSIS — Z836 Family history of other diseases of the respiratory system: Secondary | ICD-10-CM | POA: Diagnosis not present

## 2019-06-03 DIAGNOSIS — D6869 Other thrombophilia: Secondary | ICD-10-CM

## 2019-06-03 LAB — BASIC METABOLIC PANEL
Anion gap: 9 (ref 5–15)
BUN: 12 mg/dL (ref 6–20)
CO2: 23 mmol/L (ref 22–32)
Calcium: 9.4 mg/dL (ref 8.9–10.3)
Chloride: 105 mmol/L (ref 98–111)
Creatinine, Ser: 0.96 mg/dL (ref 0.61–1.24)
GFR calc Af Amer: >60 mL/min (ref >60)
GFR calc non Af Amer: >60 mL/min (ref >60)
Glucose, Bld: 191 mg/dL — ABNORMAL HIGH (ref 70–99)
Potassium: 4.7 mmol/L (ref 3.5–5.1)
Sodium: 137 mmol/L (ref 135–145)

## 2019-06-03 LAB — MAGNESIUM: Magnesium: 2 mg/dL (ref 1.7–2.4)

## 2019-06-03 MED ORDER — SODIUM CHLORIDE 0.9 % IV SOLN: 250.0000 mL | INTRAVENOUS | Status: DC | PRN

## 2019-06-03 MED ORDER — CARVEDILOL 25 MG PO TABS
25.0000 mg | ORAL_TABLET | Freq: Two times a day (BID) | ORAL | Status: DC
  Administered 2019-06-03 – 2019-06-06 (×6): 25 mg via ORAL
  Filled 2019-06-03 (×6): qty 1

## 2019-06-03 MED ORDER — ATORVASTATIN CALCIUM 40 MG PO TABS
40.0000 mg | ORAL_TABLET | Freq: Every day | ORAL | Status: DC
  Administered 2019-06-03 – 2019-06-06 (×4): 40 mg via ORAL
  Filled 2019-06-03 (×4): qty 1

## 2019-06-03 MED ORDER — ALPRAZOLAM 0.5 MG PO TABS: 0.5000 mg | ORAL_TABLET | Freq: Three times a day (TID) | ORAL | Status: DC | PRN

## 2019-06-03 MED ORDER — RIVAROXABAN 20 MG PO TABS
20.0000 mg | ORAL_TABLET | Freq: Every day | ORAL | Status: DC
  Administered 2019-06-04 – 2019-06-06 (×3): 20 mg via ORAL
  Filled 2019-06-03 (×3): qty 1

## 2019-06-03 MED ORDER — PREGABALIN 75 MG PO CAPS
150.0000 mg | ORAL_CAPSULE | Freq: Two times a day (BID) | ORAL | Status: DC
  Administered 2019-06-03 – 2019-06-06 (×6): 150 mg via ORAL
  Filled 2019-06-03 (×6): qty 2

## 2019-06-03 MED ORDER — SODIUM CHLORIDE 0.9% FLUSH
3.0000 mL | Freq: Two times a day (BID) | INTRAVENOUS | Status: DC
  Administered 2019-06-03 – 2019-06-05 (×4): 3 mL via INTRAVENOUS

## 2019-06-03 MED ORDER — LIRAGLUTIDE 18 MG/3ML ~~LOC~~ SOPN: 1.2000 mg | PEN_INJECTOR | Freq: Every day | SUBCUTANEOUS | Status: DC

## 2019-06-03 MED ORDER — METFORMIN HCL 500 MG PO TABS
1000.0000 mg | ORAL_TABLET | Freq: Two times a day (BID) | ORAL | Status: DC
  Administered 2019-06-03 – 2019-06-06 (×6): 1000 mg via ORAL
  Filled 2019-06-03 (×7): qty 2

## 2019-06-03 MED ORDER — BENAZEPRIL HCL 40 MG PO TABS
40.0000 mg | ORAL_TABLET | Freq: Every day | ORAL | Status: DC
  Administered 2019-06-04 – 2019-06-06 (×3): 40 mg via ORAL
  Filled 2019-06-03 (×3): qty 1

## 2019-06-03 MED ORDER — MAGNESIUM OXIDE 400 (241.3 MG) MG PO TABS
400.0000 mg | ORAL_TABLET | Freq: Every day | ORAL | Status: DC
  Administered 2019-06-04 – 2019-06-06 (×3): 400 mg via ORAL
  Filled 2019-06-03 (×3): qty 1

## 2019-06-03 MED ORDER — TAMSULOSIN HCL 0.4 MG PO CAPS
0.4000 mg | ORAL_CAPSULE | Freq: Every day | ORAL | Status: DC
  Administered 2019-06-03 – 2019-06-06 (×4): 0.4 mg via ORAL
  Filled 2019-06-03 (×4): qty 1

## 2019-06-03 MED ORDER — DOFETILIDE 500 MCG PO CAPS
500.0000 ug | ORAL_CAPSULE | Freq: Two times a day (BID) | ORAL | Status: DC
  Administered 2019-06-03 – 2019-06-06 (×6): 500 ug via ORAL
  Filled 2019-06-03 (×6): qty 1

## 2019-06-03 MED ORDER — AMLODIPINE BESYLATE 5 MG PO TABS
5.0000 mg | ORAL_TABLET | Freq: Every day | ORAL | Status: DC
  Administered 2019-06-03 – 2019-06-06 (×4): 5 mg via ORAL
  Filled 2019-06-03 (×4): qty 1

## 2019-06-03 MED ORDER — SODIUM CHLORIDE 0.9% FLUSH: 3.0000 mL | INTRAVENOUS | Status: DC | PRN

## 2019-06-03 MED ORDER — MAGNESIUM SULFATE 2 GM/50ML IV SOLN
2.0000 g | Freq: Once | INTRAVENOUS | Status: AC
  Administered 2019-06-03: 2 g via INTRAVENOUS
  Filled 2019-06-03: qty 50

## 2019-06-03 MED ORDER — FUROSEMIDE 40 MG PO TABS
40.0000 mg | ORAL_TABLET | Freq: Every day | ORAL | Status: DC
  Administered 2019-06-04 – 2019-06-06 (×3): 40 mg via ORAL
  Filled 2019-06-03 (×3): qty 1

## 2019-06-03 MED ORDER — GLIPIZIDE ER 5 MG PO TB24
10.0000 mg | ORAL_TABLET | Freq: Every day | ORAL | Status: DC
  Administered 2019-06-04 – 2019-06-06 (×3): 10 mg via ORAL
  Filled 2019-06-03 (×3): qty 2

## 2019-06-03 MED ORDER — EMPAGLIFLOZIN 10 MG PO TABS
10.0000 mg | ORAL_TABLET | Freq: Every day | ORAL | Status: DC
  Administered 2019-06-04 – 2019-06-06 (×3): 10 mg via ORAL
  Filled 2019-06-03 (×3): qty 1

## 2019-06-03 MED ORDER — ZOLPIDEM TARTRATE 5 MG PO TABS: 5.0000 mg | ORAL_TABLET | Freq: Every evening | ORAL | Status: DC | PRN

## 2019-06-03 MED ORDER — RIVAROXABAN 20 MG PO TABS
20.0000 mg | ORAL_TABLET | Freq: Every day | ORAL | Status: DC
  Filled 2019-06-03: qty 1

## 2019-06-03 MED ORDER — ALBUTEROL SULFATE (2.5 MG/3ML) 0.083% IN NEBU: 2.5000 mg | INHALATION_SOLUTION | RESPIRATORY_TRACT | Status: DC | PRN

## 2019-06-03 NOTE — H&P (Signed)
Primary Care Physician: Tammi Sou, MD  Referring Physician: [facility] Primary Cardiologist: Dr Stanford Breed Primary EP: Dr Cleda Mccreedy is a 59 y.o. male with a history of atrial flutter, OSA, HTN, DM who presents for follow up in the [facility].  The patient was initially diagnosed with atrial flutter 02/03/18 when his Apple Watch showed that he had an irregular rhythm. 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 He had no awareness of his arrhythmia. Patient is s/p atrial flutter ablation 07/09/18 with Dr Lovena Le. Zio patch 12/2018 showed 35% afib burden with rapid rates at times. He was started back on Xarelto for a CHADS2VASC score of 4 by Dr Stanford Breed. Patient was seen at the ER 04/08/19 for hypotension. Patient states he was inadvertently taking metoprolol and carvediloltogether. He was also found to be in afib but converted to SR after fluid bolus.Since stopping both carvedilol and metoprolol, he has noted some heart rates up to 110s. Prior to this, he states his afib has been well controlled with few episodes on his Apple Watch and always rate controlled. On follow up with Kerin Ransom 04/17/19, patient was in rate controlled atrial flutter but with several days of elevated heart rates on his Apple Watch.   On follow up today, patient presents for dofetilide admission. He reports he has done well since his last visit. He denies any missed doses of anticoagulation in the last 3 weeks.   Today, he denies symptoms of palpitations, chest pain, SOB, orthopnea, PND, lower extremity edema, dizziness, presyncope, syncope, snoring, daytime somnolence, bleeding, or neurologic sequela. The patient is tolerating medications without difficulties and is otherwise without complaint today.    Atrial Fibrillation Risk Factors:  he does have symptoms or  diagnosis of sleep apnea. he is compliant with CPAP therapy. he does not have a history of rheumatic fever. he does not have a history of significant alcohol use.  he has a BMI of Body mass index is 39.58 kg/m.Marland Kitchen    Filed Weights   06/03/19 1136  Weight: 136.1 kg     Atrial Fibrillation Management history:  Previous antiarrhythmic drugs: none Previous cardioversions: none Previous ablations: 07/09/18 flutter CHADS2VASC score: 4 (DM, HTN, CAD, CM) Anticoagulation history: Xarelto       Past Medical History:  Diagnosis Date  . Anxiety   . Arthritis    lower back   . Atrial flutter ([place]) 2020   Cards->lopressor and anticoag, ablation. In sinus still as of 08/2018 cards f/u.  Recurrence + PAF->requiring pt to get back on xarelto  . BPH with obstruction/lower urinary tract symptoms    started flomax via urol 03/2019  . CAD (coronary artery disease) 03/2018   Ostial first diagonal 60% narrowed, otherwise normal coronaries. Dr. Stanford Breed added ASA and statin 08/2018.  Marland Kitchen Depression   . Diabetes mellitus   . False positive stress test 03/2018   Cath showed normal coronaries with EF 50%  . GERD (gastroesophageal reflux disease)   . Hyperlipidemia   . Hypertension   . Hypogonadism, male 05/2015  . Increased prostate specific antigen (PSA) velocity 2019   06/2017 velocity up; recheck 10/2017 back to baseline.  Plan repeat 1 yr.  . Low back pain 2016   Left lumbar radiculopathy, lumbar spondylolisthesis.  Sciatica episode 03/2016.  Ortho getting L spine MRI as of 07/2016.  Marland Kitchen  Microcytic anemia 12/2015   suspect iron def due to malabsorption.  Hemoccults NEG 01/26/16, ferrous sulfate started.  . Neuropathic pain of left foot   . NICM (nonischemic cardiomyopathy) ([place]) 05/2018   EF 40-45%, +LV hypokinesis. Suspected to be tachycardia-mediated->Dr. Stanford Breed to rpt echo fall 2020.  Marland Kitchen OSA on CPAP    cpap settings at 3  . Peyronie's disease 2021   Dr. Porfirio Oar  (collegenase intralesional injections)        Past Surgical History:  Procedure Laterality Date  . A-FLUTTER ABLATION N/A 07/09/2018   Procedure: A-FLUTTER ABLATION;  Surgeon: Evans Lance, MD;  Location: [facility];  Service: Cardiovascular;  Laterality: N/A;  . ADENOIDECTOMY    . BREATH TEK H PYLORI N/A 01/08/2013   Procedure: [place];  Surgeon: [name], MD;  Location: [facility];  Service: General;  Laterality: N/A;  . CARDIAC CATHETERIZATION  03/2018   No signi obstructive CAD (suspected FALSE POSITIVE STRESS TEST).  EF 50%.  Diastolic dysfunction.  Marland Kitchen CARDIOVASCULAR STRESS TEST  03/2018   Intermediate risk; EF 40%, medium sized defect with peri-infarct ischemia-->follow up cath showed no signif obstructive CAD.  Marland Kitchen COLONOSCOPY  04/17/2014   Benign polyp x 1.  Recall 10 yrs.  Marland Kitchen GASTRIC ROUX-EN-Y N/A 04/14/2013   Procedure: LAPAROSCOPIC ROUX-EN-Y GASTRIC BYPASS WITH UPPER ENDOSCOPY;  Surgeon: [name], MD;  Location: [facility];  Service: General;  Laterality: N/A;  . LEFT HEART CATH AND CORONARY ANGIOGRAPHY N/A 03/22/2018   NO CAD.  EF 50%. Procedure: LEFT HEART CATH AND CORONARY ANGIOGRAPHY;  Surgeon: Belva Crome, MD;  Location: [facility];  Service: Cardiovascular;  Laterality: N/A;  . Rhythm monitoring  01/08/2019   7 day monitor->Sinus rhythm with occasional PAC, PVC, paroxysmal atrial flutter, paroxysmal atrial fibrillation and 7 beats nonsustained ventricular tachycardia: xarelto restarted  . TONSILLECTOMY    . TRANSTHORACIC ECHOCARDIOGRAM  05/13/2018;12/03/18   05/2018 severe hypokinesis of LV inferior and inferolateral walls, EF 40-45%, correlates with stress test.  [id number], diastolic fxn/LV filling pressure not determined due to pt in a fib/flutter          Current Outpatient Medications  Medication Sig Dispense Refill  . albuterol (VENTOLIN HFA) 108 (90 Base) MCG/ACT inhaler     . ALPRAZolam (XANAX)  0.5 MG tablet TAKE 1 TABLET BY MOUTH EVERY 8 HOURS AS NEEDED FOR SLEEP OR ANXIETY (Patient taking differently: Take 0.5 mg by mouth every 8 (eight) hours as needed for anxiety or sleep. ) 90 tablet 5  . amLODipine (NORVASC) 5 MG tablet Take 1 tablet (5 mg total) by mouth daily. 90 tablet 3  . atorvastatin (LIPITOR) 40 MG tablet Take 1 tablet (40 mg total) by mouth daily. 90 tablet 3  . benazepril (LOTENSIN) 40 MG tablet Take 1 tablet (40 mg total) by mouth daily. 30 tablet 0  . carvedilol (COREG) 25 MG tablet Take 1 tablet (25 mg total) by mouth 2 (two) times daily. 60 tablet 6  . furosemide (LASIX) 40 MG tablet Take 1 tablet by mouth once daily 90 tablet 0  . glipiZIDE (GLUCOTROL XL) 10 MG 24 hr tablet Take 1 tablet by mouth once daily with breakfast 30 tablet 0  . Insulin Pen Needle 31G X 5 MM MISC Use with Victoza as directed 100 each 11  . JARDIANCE 10 MG TABS tablet TAKE 1 TABLET BY MOUTH ONCE DAILY BEFORE BREAKFAST 30 tablet 0  . liraglutide (VICTOZA) 18 MG/3ML SOPN  INJECT 1.2MG  SUBCUTANEOUSLY ONCE DAILY 15 mL 6  . magnesium oxide (MAG-OX) 400 (241.3 Mg) MG tablet Take 1 tablet (400 mg total) by mouth daily. 30 tablet 3  . metFORMIN (GLUCOPHAGE) 1000 MG tablet TAKE 1 TABLET BY MOUTH TWICE DAILY WITH A MEAL 180 tablet 3  . pregabalin (LYRICA) 150 MG capsule Take 1 capsule by mouth twice daily 60 capsule 5  . tamsulosin (FLOMAX) 0.4 MG CAPS capsule Take 0.4 mg by mouth.    . testosterone cypionate (DEPOTESTOSTERONE CYPIONATE) 200 MG/ML injection 1 ml IM q 14 days 10 mL 0  . XARELTO 20 MG TABS tablet TAKE 1 TABLET BY MOUTH ONCE DAILY WITH  SUPPER 90 tablet 2  . XIAFLEX 0.9 MG SOLR     . zaleplon (SONATA) 5 MG capsule TAKE 1 CAPSULE BY MOUTH AT BEDTIME AS NEEDED FOR SLEEP 90 capsule 1   No current facility-administered medications for this encounter.    No Known Allergies  Social History        Socioeconomic History  . Marital status: Divorced    Spouse name: Not on file    . Number of children: Not on file  . Years of education: Not on file  . Highest education level: Not on file  Occupational History  . Occupation: Paramedic-EMT    Employer: [employer]  Tobacco Use  . Smoking status: Never Smoker  . Smokeless tobacco: Former Systems developer    Types: Chew  Substance and Sexual Activity  . Alcohol use: Yes    Alcohol/week: 18.0 standard drinks    Types: 18 Cans of beer per week    Comment: 18  . Drug use: No  . Sexual activity: Not on file  Other Topics Concern  . Not on file  Social History Narrative   Married, no children.   Occupation: paramedic   Social Determinants of Scientist, physiological Strain:   . Difficulty of Paying Living Expenses:   Food Insecurity:   . Worried About Charity fundraiser in the Last Year:   . Arboriculturist in the Last Year:   Transportation Needs:   . Film/video editor (Medical):   Marland Kitchen Lack of Transportation (Non-Medical):   Physical Activity:   . Days of Exercise per Week:   . Minutes of Exercise per Session:   Stress:   . Feeling of Stress :   Social Connections:   . Frequency of Communication with Friends and Family:   . Frequency of Social Gatherings with Friends and Family:   . Attends Religious Services:   . Active Member of Clubs or Organizations:   . Attends Archivist Meetings:   Marland Kitchen Marital Status:   Intimate Partner Violence:   . Fear of Current or Ex-Partner:   . Emotionally Abused:   Marland Kitchen Physically Abused:   . Sexually Abused:          Family History  Problem Relation Age of Onset  . Heart disease Father        valvular disease  . COPD Father   . Prostate cancer Father   . Arrhythmia Father   . Colon cancer Neg Hx    The patient does not have a history of early familial atrial fibrillation or other arrhythmias.  ROS- All systems are reviewed and negative except as per the HPI above.  Physical Exam:    Vitals:   06/03/19 1136   BP: 124/70  Pulse: 76  Weight:  136.1 kg  Height: 6\' 1"  (1.854 m)    GEN- The patient is well appearing obese male, alert and oriented x 3 today.   HEENT-head normocephalic, atraumatic, sclera clear, conjunctiva pink, hearing intact, trachea midline. Lungs- Clear to ausculation bilaterally, normal work of breathing Heart- Regular rate and rhythm, no murmurs, rubs or gallops  GI- soft, NT, ND, + BS Extremities- no clubbing, cyanosis, or edema MS- no significant deformity or atrophy Skin- no rash or lesion Psych- euthymic mood, full affect Neuro- strength and sensation are intact      Wt Readings from Last 3 Encounters:  06/03/19 136.1 kg  05/30/19 131.3 kg  05/01/19 (!) 136.2 kg    EKG today demonstrates SR HR 76, PR 206, QRS 106, QTc 418  Epic records are reviewed at length today  LHC 03/22/18  Elevated left ventricular end-diastolic pressure, 21 mmHg. EF 50%. Findings consistent with diastolic dysfunction.  Widely patent coronary arteries.  Normal left main  Normal LAD. Ostial first diagonal 60% narrowed.  Normal circumflex  Normal right coronary  False positive myocardial perfusion study related to diaphragm attenuation  Echo 12/03/18 1. Left ventricular ejection fraction, by visual estimation, is 40 to 45%. The left ventricle has mildly decreased function. There is mildly increased left ventricular hypertrophy. 2. Left ventricular diastolic function could not be evaluated. 3. Global right ventricle was not well visualized.The right ventricular size is not well visualized. Right vetricular wall thickness was not assessed. 4. Left atrial size was normal. 5. Right atrial size was moderately dilated. 6. Mild to moderate aortic valve annular calcification. 7. The mitral valve is normal in structure. No evidence of mitral valve regurgitation. No evidence of mitral stenosis. 8. The tricuspid valve is grossly normal. Tricuspid valve regurgitation is  not demonstrated. 9. The aortic valve is normal in structure. Aortic valve regurgitation is not visualized. No evidence of aortic valve sclerosis or stenosis. 10. The pulmonic valve was normal in structure. Pulmonic valve regurgitation is not visualized. 11. The atrial septum is grossly normal.   Assessment and Plan:  1. Paroxysmal atrial fibrillation/Typical atrial flutter S/p atrial flutter ablation with Dr Lovena Le 07/09/18 Patient wants to pursue dofetilide, aware of risk vrs benefit Aware of price of dofetilide No benadryl use PharmD has screened drugs and no QT prolonging drugs on board QTc in SR 418-438 ms Labs today show creatinine at 0.96, K+ 4.7 and mag 2.0, CrCl calculated at 163 mL/min Continue Xarelto 20 mg daily. Patient denies any missed doses in the last 3 weeks. Continue Coreg 25 mg BID Lifestyle changes as below. Apple watch for monitoring of heart rate.   This patients CHA2DS2-VASc Score and unadjusted Ischemic Stroke Rate (% per year) is equal to 4.8 % stroke rate/year from a score of 4  Above score calculated as 1 point each if present [CHF, HTN, DM, Vascular=MI/PAD/Aortic Plaque, Age if 65-74, or Male] Above score calculated as 2 points each if present [Age > 75, or Stroke/TIA/TE]  2. Obesity Body mass index is 39.58 kg/m. Lifestyle modification was discussed and encouraged including regular physical activity and weight reduction.  3. Obstructive sleep apnea Patient compliant with CPAP therapy. Followed by Dr Claiborne Billings  4. HTN Stable, no changes today.   To be admitted later today once a bed becomes available.    [facility] [address] [place], [place] [address] [phone] 06/03/2019 12:55 PM EP Attending  Patient seen and examined. Agree with above. The patient presents today  for initiation of dofetilide. He has PAF. His QT is acceptable.   Mikle Bosworth.D.

## 2019-06-03 NOTE — TOC Benefit Eligibility Note (Signed)
Transition of Care [facility]) Benefit Eligibility Note    Patient Details  Name: NORI KUNSELMAN MRN: [id number] Date of Birth: July 31, 1960   Medication/Dose: DOFETILIDE  125 MCG  CO-PAY- $10.00    DOFETILIDE 250 MCG  CO-PAY- $10.00     DOFETILIDE  500 MCG  CO-PAY- $10.00+  Covered?: Yes  Tier: (TIER: 1 DRUG)  Prescription Coverage Preferred Pharmacy: [facility]  and   [facility]  Spoke with Person/Company/Phone Number:: JEN    @   [employer]  # [phone]  Co-Pay: $ 10.00  Prior Approval: No  Deductible: Unmet  Additional Notes: TIKOSYN 125 MCG  250 MCG  500 MCG BID : NOT COVER   P/A -YES # [id number]    Memory Argue Phone Number: 06/03/2019, 4:00 PM

## 2019-06-03 NOTE — Progress Notes (Signed)
Pharmacy: Dofetilide (Tikosyn) - Initial Consult Assessment and Electrolyte Replacement  Pharmacy consulted to assist in monitoring and replacing electrolytes in this 59 y.o. male admitted on 06/03/2019 undergoing dofetilide initiation. First dofetilide dose: 6/1 PM  Assessment:  Patient Exclusion Criteria: If any screening criteria checked as "Yes", then  patient  should NOT receive dofetilide until criteria item is corrected.  If "Yes" please indicate correction plan.  YES  NO Patient  Exclusion Criteria Correction Plan   []   [x]   Baseline QTc interval is greater than or equal to 440 msec. IF above YES box checked dofetilide contraindicated unless patient has ICD; then may proceed if QTc 500-550 msec or with known ventricular conduction abnormalities may proceed with QTc 550-600 msec. QTc = 417    []   [x]   Patient is known or suspected to have a digoxin level greater than 2 ng/ml: No results found for: DIGOXIN     []   [x]   Creatinine clearance less than 20 ml/min (calculated using Cockcroft-Gault, actual body weight and serum creatinine): Estimated Creatinine Clearance: 122.8 mL/min (by C-G formula based on SCr of 0.96 mg/dL).     []   [x]  Patient has received drugs known to prolong the QT intervals within the last 48 hours (phenothiazines, tricyclics or tetracyclic antidepressants, erythromycin, H-1 antihistamines, cisapride, fluoroquinolones, azithromycin). Updated information on QT prolonging agents is available to be searched on the following database:QT prolonging agents     []   [x]   Patient received a dose of hydrochlorothiazide (Oretic) alone or in any combination including triamterene (Dyazide, Maxzide) in the last 48 hours.    []   [x]  Patient received a medication known to increase dofetilide plasma concentrations prior to initial dofetilide dose:  . Trimethoprim (Primsol, Proloprim) in the last 36 hours . Verapamil (Calan, Verelan) in the last 36 hours or a sustained  release dose in the last 72 hours . Megestrol (Megace) in the last 5 days  . Cimetidine (Tagamet) in the last 6 hours . Ketoconazole (Nizoral) in the last 24 hours . Itraconazole (Sporanox) in the last 48 hours  . Prochlorperazine (Compazine) in the last 36 hours     []   [x]   Patient is known to have a history of torsades de pointes; congenital or acquired long QT syndromes.    []   [x]   Patient has received a Class 1 antiarrhythmic with less than 2 half-lives since last dose. (Disopyramide, Quinidine, Procainamide, Lidocaine, Mexiletine, Flecainide, Propafenone)    []   [x]   Patient has received amiodarone therapy in the past 3 months or amiodarone level is greater than 0.3 ng/ml.    Patient has been appropriately anticoagulated with Xarelto.  Labs:    Component Value Date/Time   K 4.7 06/03/2019 1131   MG 2.0 06/03/2019 1131     Plan: Potassium: K >/= 4: Appropriate to initiate Tikosyn, no replacement needed    Magnesium: Mg 1.8-2: Give Mg 2 gm IV x1 to prevent Mg from dropping below 1.8 - do not need to recheck Mg. Appropriate to initiate Tikosyn   Thank you for allowing pharmacy to participate in this patient's care   Antonietta Jewel, PharmD, [place] Pharmacist  Phone: [phone] 06/03/2019 1:51 PM  Please check AMION for all [place] phone numbers After 10:00 PM, call [place] [phone]

## 2019-06-03 NOTE — Progress Notes (Signed)
Primary Care Physician: Tammi Sou, MD  Referring Physician: [facility] Primary Cardiologist: Dr Stanford Breed Primary EP: Dr Cleda Mccreedy is a 59 y.o. male with a history of atrial flutter, OSA, HTN, DM who presents for follow up in the [facility].  The patient was initially diagnosed with atrial flutter 02/03/18 when his Apple Watch showed that he had an irregular rhythm. 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 He had no awareness of his arrhythmia. Patient is s/p atrial flutter ablation 07/09/18 with Dr Lovena Le. Zio patch 12/2018 showed 35% afib burden with rapid rates at times. He was started back on Xarelto for a CHADS2VASC score of 4 by Dr Stanford Breed. Patient was seen at the ER 04/08/19 for hypotension. Patient states he was inadvertently taking metoprolol and carvedilol together. He was also found to be in afib but converted to SR after fluid bolus. Since stopping both carvedilol and metoprolol, he has noted some heart rates up to 110s. Prior to this, he states his afib has been well controlled with few episodes on his Apple Watch and always rate controlled. On follow up with Kerin Ransom 04/17/19, patient was in rate controlled atrial flutter but with several days of elevated heart rates on his Apple Watch.   On follow up today, patient presents for dofetilide admission. He reports he has done well since his last visit. He denies any missed doses of anticoagulation in the last 3 weeks.   Today, he denies symptoms of palpitations, chest pain, SOB, orthopnea, PND, lower extremity edema, dizziness, presyncope, syncope, snoring, daytime somnolence, bleeding, or neurologic sequela. The patient is tolerating medications without difficulties and is otherwise without complaint today.    Atrial Fibrillation Risk Factors:  he does have symptoms or diagnosis of  sleep apnea. he is compliant with CPAP therapy. he does not have a history of rheumatic fever. he does not have a history of significant alcohol use.  he has a BMI of Body mass index is 39.58 kg/m.Marland Kitchen Filed Weights   06/03/19 1136  Weight: 136.1 kg     Atrial Fibrillation Management history:  Previous antiarrhythmic drugs: none Previous cardioversions: none Previous ablations: 07/09/18 flutter CHADS2VASC score: 4 (DM, HTN, CAD, CM) Anticoagulation history: Xarelto   Past Medical History:  Diagnosis Date  . Anxiety   . Arthritis    lower back   . Atrial flutter ([place]) 2020   Cards->lopressor and anticoag, ablation. In sinus still as of 08/2018 cards f/u.  Recurrence + PAF->requiring pt to get back on xarelto  . BPH with obstruction/lower urinary tract symptoms    started flomax via urol 03/2019  . CAD (coronary artery disease) 03/2018   Ostial first diagonal 60% narrowed, otherwise normal coronaries. Dr. Stanford Breed added ASA and statin 08/2018.  Marland Kitchen Depression   . Diabetes mellitus   . False positive stress test 03/2018   Cath showed normal coronaries with EF 50%  . GERD (gastroesophageal reflux disease)   . Hyperlipidemia   . Hypertension   . Hypogonadism, male 05/2015  . Increased prostate specific antigen (PSA) velocity 2019   06/2017 velocity up; recheck 10/2017 back to baseline.  Plan repeat 1 yr.  . Low back pain 2016   Left lumbar radiculopathy, lumbar spondylolisthesis.  Sciatica episode 03/2016.  Ortho getting L spine MRI as of 07/2016.  . Microcytic anemia 12/2015  suspect iron def due to malabsorption.  Hemoccults NEG 01/26/16, ferrous sulfate started.  . Neuropathic pain of left foot   . NICM (nonischemic cardiomyopathy) ([place]) 05/2018   EF 40-45%, +LV hypokinesis. Suspected to be tachycardia-mediated->Dr. Stanford Breed to rpt echo fall 2020.  Marland Kitchen OSA on CPAP    cpap settings at 3  . Peyronie's disease 2021   Dr. Porfirio Oar (collegenase intralesional injections)   Past  Surgical History:  Procedure Laterality Date  . A-FLUTTER ABLATION N/A 07/09/2018   Procedure: A-FLUTTER ABLATION;  Surgeon: Evans Lance, MD;  Location: [facility];  Service: Cardiovascular;  Laterality: N/A;  . ADENOIDECTOMY    . BREATH TEK H PYLORI N/A 01/08/2013   Procedure: [place];  Surgeon: [name], MD;  Location: [facility];  Service: General;  Laterality: N/A;  . CARDIAC CATHETERIZATION  03/2018   No signi obstructive CAD (suspected FALSE POSITIVE STRESS TEST).  EF 50%.  Diastolic dysfunction.  Marland Kitchen CARDIOVASCULAR STRESS TEST  03/2018   Intermediate risk; EF 40%, medium sized defect with peri-infarct ischemia-->follow up cath showed no signif obstructive CAD.  Marland Kitchen COLONOSCOPY  04/17/2014   Benign polyp x 1.  Recall 10 yrs.  Marland Kitchen GASTRIC ROUX-EN-Y N/A 04/14/2013   Procedure: LAPAROSCOPIC ROUX-EN-Y GASTRIC BYPASS WITH UPPER ENDOSCOPY;  Surgeon: [name], MD;  Location: [facility];  Service: General;  Laterality: N/A;  . LEFT HEART CATH AND CORONARY ANGIOGRAPHY N/A 03/22/2018   NO CAD.  EF 50%. Procedure: LEFT HEART CATH AND CORONARY ANGIOGRAPHY;  Surgeon: Belva Crome, MD;  Location: [facility];  Service: Cardiovascular;  Laterality: N/A;  . Rhythm monitoring  01/08/2019   7 day monitor->Sinus rhythm with occasional PAC, PVC, paroxysmal atrial flutter, paroxysmal atrial fibrillation and 7 beats nonsustained ventricular tachycardia: xarelto restarted  . TONSILLECTOMY    . TRANSTHORACIC ECHOCARDIOGRAM  05/13/2018;12/03/18   05/2018 severe hypokinesis of LV inferior and inferolateral walls, EF 40-45%, correlates with stress test.  [id number], diastolic fxn/LV filling pressure not determined due to pt in a fib/flutter    Current Outpatient Medications  Medication Sig Dispense Refill  . albuterol (VENTOLIN HFA) 108 (90 Base) MCG/ACT inhaler     . ALPRAZolam (XANAX) 0.5 MG tablet TAKE 1 TABLET BY MOUTH EVERY 8 HOURS AS NEEDED FOR SLEEP OR ANXIETY  (Patient taking differently: Take 0.5 mg by mouth every 8 (eight) hours as needed for anxiety or sleep. ) 90 tablet 5  . amLODipine (NORVASC) 5 MG tablet Take 1 tablet (5 mg total) by mouth daily. 90 tablet 3  . atorvastatin (LIPITOR) 40 MG tablet Take 1 tablet (40 mg total) by mouth daily. 90 tablet 3  . benazepril (LOTENSIN) 40 MG tablet Take 1 tablet (40 mg total) by mouth daily. 30 tablet 0  . carvedilol (COREG) 25 MG tablet Take 1 tablet (25 mg total) by mouth 2 (two) times daily. 60 tablet 6  . furosemide (LASIX) 40 MG tablet Take 1 tablet by mouth once daily 90 tablet 0  . glipiZIDE (GLUCOTROL XL) 10 MG 24 hr tablet Take 1 tablet by mouth once daily with breakfast 30 tablet 0  . Insulin Pen Needle 31G X 5 MM MISC Use with Victoza as directed 100 each 11  . JARDIANCE 10 MG TABS tablet TAKE 1 TABLET BY MOUTH ONCE DAILY BEFORE BREAKFAST 30 tablet 0  . liraglutide (VICTOZA) 18 MG/3ML SOPN INJECT 1.2MG  SUBCUTANEOUSLY ONCE DAILY 15 mL 6  . magnesium oxide (MAG-OX) 400 (241.3 Mg)  MG tablet Take 1 tablet (400 mg total) by mouth daily. 30 tablet 3  . metFORMIN (GLUCOPHAGE) 1000 MG tablet TAKE 1 TABLET BY MOUTH TWICE DAILY WITH A MEAL 180 tablet 3  . pregabalin (LYRICA) 150 MG capsule Take 1 capsule by mouth twice daily 60 capsule 5  . tamsulosin (FLOMAX) 0.4 MG CAPS capsule Take 0.4 mg by mouth.    . testosterone cypionate (DEPOTESTOSTERONE CYPIONATE) 200 MG/ML injection 1 ml IM q 14 days 10 mL 0  . XARELTO 20 MG TABS tablet TAKE 1 TABLET BY MOUTH ONCE DAILY WITH  SUPPER 90 tablet 2  . XIAFLEX 0.9 MG SOLR     . zaleplon (SONATA) 5 MG capsule TAKE 1 CAPSULE BY MOUTH AT BEDTIME AS NEEDED FOR SLEEP 90 capsule 1   No current facility-administered medications for this encounter.    No Known Allergies  Social History   Socioeconomic History  . Marital status: Divorced    Spouse name: Not on file  . Number of children: Not on file  . Years of education: Not on file  . Highest education level:  Not on file  Occupational History  . Occupation: Paramedic-EMT    Employer: [employer]  Tobacco Use  . Smoking status: Never Smoker  . Smokeless tobacco: Former Systems developer    Types: Chew  Substance and Sexual Activity  . Alcohol use: Yes    Alcohol/week: 18.0 standard drinks    Types: 18 Cans of beer per week    Comment: 18  . Drug use: No  . Sexual activity: Not on file  Other Topics Concern  . Not on file  Social History Narrative   Married, no children.   Occupation: paramedic   Social Determinants of Radio broadcast assistant Strain:   . Difficulty of Paying Living Expenses:   Food Insecurity:   . Worried About Charity fundraiser in the Last Year:   . Arboriculturist in the Last Year:   Transportation Needs:   . Film/video editor (Medical):   Marland Kitchen Lack of Transportation (Non-Medical):   Physical Activity:   . Days of Exercise per Week:   . Minutes of Exercise per Session:   Stress:   . Feeling of Stress :   Social Connections:   . Frequency of Communication with Friends and Family:   . Frequency of Social Gatherings with Friends and Family:   . Attends Religious Services:   . Active Member of Clubs or Organizations:   . Attends Archivist Meetings:   Marland Kitchen Marital Status:   Intimate Partner Violence:   . Fear of Current or Ex-Partner:   . Emotionally Abused:   Marland Kitchen Physically Abused:   . Sexually Abused:     Family History  Problem Relation Age of Onset  . Heart disease Father        valvular disease  . COPD Father   . Prostate cancer Father   . Arrhythmia Father   . Colon cancer Neg Hx    The patient does not have a history of early familial atrial fibrillation or other arrhythmias.  ROS- All systems are reviewed and negative except as per the HPI above.  Physical Exam: Vitals:   06/03/19 1136  BP: 124/70  Pulse: 76  Weight: 136.1 kg  Height: 6\' 1"  (1.854 m)    GEN- The patient is well appearing obese male, alert and oriented x  3 today.   HEENT-head normocephalic, atraumatic, sclera clear, conjunctiva  pink, hearing intact, trachea midline. Lungs- Clear to ausculation bilaterally, normal work of breathing Heart- Regular rate and rhythm, no murmurs, rubs or gallops  GI- soft, NT, ND, + BS Extremities- no clubbing, cyanosis, or edema MS- no significant deformity or atrophy Skin- no rash or lesion Psych- euthymic mood, full affect Neuro- strength and sensation are intact   Wt Readings from Last 3 Encounters:  06/03/19 136.1 kg  05/30/19 131.3 kg  05/01/19 (!) 136.2 kg    EKG today demonstrates SR HR 76, PR 206, QRS 106, QTc 418  Epic records are reviewed at length today  LHC 03/22/18  Elevated left ventricular end-diastolic pressure, 21 mmHg. EF 50%. Findings consistent with diastolic dysfunction.  Widely patent coronary arteries.  Normal left main  Normal LAD. Ostial first diagonal 60% narrowed.  Normal circumflex  Normal right coronary  False positive myocardial perfusion study related to diaphragm attenuation  Echo 12/03/18 1. Left ventricular ejection fraction, by visual estimation, is 40 to 45%. The left ventricle has mildly decreased function. There is mildly increased left ventricular hypertrophy.  2. Left ventricular diastolic function could not be evaluated.  3. Global right ventricle was not well visualized.The right ventricular size is not well visualized. Right vetricular wall thickness was not assessed.  4. Left atrial size was normal.  5. Right atrial size was moderately dilated.  6. Mild to moderate aortic valve annular calcification.  7. The mitral valve is normal in structure. No evidence of mitral valve regurgitation. No evidence of mitral stenosis.  8. The tricuspid valve is grossly normal. Tricuspid valve regurgitation is not demonstrated.  9. The aortic valve is normal in structure. Aortic valve regurgitation is not visualized. No evidence of aortic valve sclerosis or  stenosis. 10. The pulmonic valve was normal in structure. Pulmonic valve regurgitation is not visualized. 11. The atrial septum is grossly normal.   Assessment and Plan:  1. Paroxysmal atrial fibrillation/Typical atrial flutter S/p atrial flutter ablation with Dr Lovena Le 07/09/18 Patient wants to pursue dofetilide, aware of risk vrs benefit Aware of price of dofetilide No benadryl use PharmD has screened drugs and no QT prolonging drugs on board QTc in SR 418-438 ms Labs today show creatinine at 0.96, K+ 4.7 and mag 2.0, CrCl calculated at 163 mL/min Continue Xarelto 20 mg daily. Patient denies any missed doses in the last 3 weeks. Continue Coreg 25 mg BID Lifestyle changes as below. Apple watch for monitoring of heart rate.   This patients CHA2DS2-VASc Score and unadjusted Ischemic Stroke Rate (% per year) is equal to 4.8 % stroke rate/year from a score of 4  Above score calculated as 1 point each if present [CHF, HTN, DM, Vascular=MI/PAD/Aortic Plaque, Age if 65-74, or Male] Above score calculated as 2 points each if present [Age > 75, or Stroke/TIA/TE]  2. Obesity Body mass index is 39.58 kg/m. Lifestyle modification was discussed and encouraged including regular physical activity and weight reduction.  3. Obstructive sleep apnea Patient compliant with CPAP therapy. Followed by Dr Claiborne Billings  4. HTN Stable, no changes today.   To be admitted later today once a bed becomes available.    [facility] [address] [place], [place] [address] [phone] 06/03/2019 12:55 PM

## 2019-06-03 NOTE — Care Management (Signed)
Benefit check submitted for Tikosyn. TOC pharmacy added to profile.

## 2019-06-03 NOTE — Telephone Encounter (Signed)
Medication list reviewed in anticipation of upcoming Tikosyn initiation. Patient is not taking any contraindicated or QTc prolonging medications.   Patient is anticoagulated on Xarelto 20mg daily on the appropriate dose. Please ensure that patient has not missed any anticoagulation doses in the 3 weeks prior to Tikosyn initiation.   Patient will need to be counseled to avoid use of Benadryl while on Tikosyn and in the 2-3 days prior to Tikosyn initiation.  

## 2019-06-03 NOTE — Progress Notes (Signed)
Pt stated he took Xarelto this am before arriving to hospital. Pharmacy notified and discussed with Pharmacist. Order will be changed to daily with breakfast.

## 2019-06-04 ENCOUNTER — Encounter (HOSPITAL_COMMUNITY): Payer: Self-pay | Admitting: Certified Registered Nurse Anesthetist

## 2019-06-04 LAB — BASIC METABOLIC PANEL
Anion gap: 6 (ref 5–15)
BUN: 12 mg/dL (ref 6–20)
CO2: 25 mmol/L (ref 22–32)
Calcium: 8.6 mg/dL — ABNORMAL LOW (ref 8.9–10.3)
Chloride: 106 mmol/L (ref 98–111)
Creatinine, Ser: 0.95 mg/dL (ref 0.61–1.24)
GFR calc Af Amer: >60 mL/min (ref >60)
GFR calc non Af Amer: >60 mL/min (ref >60)
Glucose, Bld: 99 mg/dL (ref 70–99)
Potassium: 4.1 mmol/L (ref 3.5–5.1)
Sodium: 137 mmol/L (ref 135–145)

## 2019-06-04 LAB — HIV ANTIBODY (ROUTINE TESTING W REFLEX): HIV Screen 4th Generation wRfx: NONREACTIVE

## 2019-06-04 LAB — MAGNESIUM: Magnesium: 1.9 mg/dL (ref 1.7–2.4)

## 2019-06-04 MED ORDER — MAGNESIUM SULFATE 2 GM/50ML IV SOLN
2.0000 g | Freq: Once | INTRAVENOUS | Status: AC
  Administered 2019-06-04: 2 g via INTRAVENOUS
  Filled 2019-06-04: qty 50

## 2019-06-04 NOTE — Progress Notes (Signed)
.   Pharmacy: Dofetilide (Tikosyn) - Follow Up Assessment and Electrolyte Replacement  Pharmacy consulted to assist in monitoring and replacing electrolytes in this 59 y.o. male admitted on 06/03/2019 undergoing dofetilide initiation. First dofetilide dose: 6/1@2011 .  Labs:    Component Value Date/Time   K 4.1 06/04/2019 0612   MG 1.9 06/04/2019 0612     Plan: Potassium: K >/= 4: No additional supplementation needed  Magnesium: Mg 1.8-2: Give Mg 2 gm IV x1    Thank you for allowing pharmacy to participate in this patient's care   Antonietta Jewel, PharmD, [place] Pharmacist  Phone: [phone] 06/04/2019 7:43 AM  Please check AMION for all [facility] phone numbers After 10:00 PM, call [place] [phone]

## 2019-06-04 NOTE — Progress Notes (Addendum)
Progress Note  Patient Name: Ethan Pitts Date of Encounter: 06/04/2019  Primary Cardiologist: Dr. Stanford Breed  Subjective   Feels well, working on CE for work, he is a paramedic  Inpatient Medications    Scheduled Meds:  amLODipine  5 mg Oral Daily   atorvastatin  40 mg Oral Daily   benazepril  40 mg Oral Daily   carvedilol  25 mg Oral BID   dofetilide  500 mcg Oral BID   empagliflozin  10 mg Oral Daily   furosemide  40 mg Oral Daily   glipiZIDE  10 mg Oral Q breakfast   magnesium oxide  400 mg Oral Daily   metFORMIN  1,000 mg Oral BID WC   pregabalin  150 mg Oral BID   rivaroxaban  20 mg Oral Q breakfast   sodium chloride flush  3 mL Intravenous Q12H   tamsulosin  0.4 mg Oral Daily   Continuous Infusions:  sodium chloride     PRN Meds: sodium chloride, albuterol, ALPRAZolam, sodium chloride flush, zolpidem   Vital Signs    Vitals:   06/03/19 2010 06/04/19 0008 06/04/19 0432 06/04/19 0755  BP: (!) 142/79 132/66 132/68 (!) 144/84  Pulse: 75 66 75   Resp: 18 18 18    Temp: 97.9 F (36.6 C) 98 F (36.7 C) 98 F (36.7 C) 98.3 F (36.8 C)  TempSrc: Oral Oral Oral Oral  SpO2: 96% 99% 99% 98%  Weight:   135 kg   Height:        Intake/Output Summary (Last 24 hours) at 06/04/2019 0916 Last data filed at 06/04/2019 0810 Gross per 24 hour  Intake 734 ml  Output --  Net 734 ml   Last 3 Weights 06/04/2019 06/03/2019 06/03/2019  Weight (lbs) 297 lb 9.9 oz 299 lb 8 oz 300 lb  Weight (kg) 135 kg 135.852 kg 136.079 kg      Telemetry    SR >> Afib 90's > SR - Personally Reviewed  ECG    SR 73, QT 464ms, QTc 466ms - Personally Reviewed  Physical Exam   GEN: No acute distress.   Neck: No JVD Cardiac: RRR, no murmurs, rubs, or gallops.  Respiratory: CTA b/l GI: Soft, nontender, non-distended, obese MS: No edema; No deformity. Neuro:  Nonfocal  Psych: Normal affect   Labs    High Sensitivity Troponin:  No results for input(s): TROPONINIHS in the last 720  hours.    Chemistry Recent Labs  Lab 06/03/19 1131 06/04/19 0612  NA 137 137  K 4.7 4.1  CL 105 106  CO2 23 25  GLUCOSE 191* 99  BUN 12 12  CREATININE 0.96 0.95  CALCIUM 9.4 8.6*  GFRNONAA >60 >60  GFRAA >60 >60  ANIONGAP 9 6     HematologyNo results for input(s): WBC, RBC, HGB, HCT, MCV, MCH, MCHC, RDW, PLT in the last 168 hours.  BNPNo results for input(s): BNP, PROBNP in the last 168 hours.   DDimer No results for input(s): DDIMER in the last 168 hours.   Radiology    No results found.  Cardiac Studies     LHC 03/22/18 Elevated left ventricular end-diastolic pressure, 21 mmHg.  EF 50%.  Findings consistent with diastolic dysfunction. Widely patent coronary arteries. Normal left main Normal LAD.  Ostial first diagonal 60% narrowed. Normal circumflex Normal right coronary False positive myocardial perfusion study related to diaphragm attenuation   Echo 12/03/18 1. Left ventricular ejection fraction, by visual estimation, is 40 to 45%. The  left ventricle has mildly decreased function. There is mildly increased left ventricular hypertrophy.  2. Left ventricular diastolic function could not be evaluated.  3. Global right ventricle was not well visualized.The right ventricular size is not well visualized. Right vetricular wall thickness was not assessed.  4. Left atrial size was normal.  5. Right atrial size was moderately dilated.  6. Mild to moderate aortic valve annular calcification.  7. The mitral valve is normal in structure. No evidence of mitral valve regurgitation. No evidence of mitral stenosis.  8. The tricuspid valve is grossly normal. Tricuspid valve regurgitation is not demonstrated.  9. The aortic valve is normal in structure. Aortic valve regurgitation is not visualized. No evidence of aortic valve sclerosis or stenosis. 10. The pulmonic valve was normal in structure. Pulmonic valve regurgitation is not visualized. 11. The atrial septum is grossly  normal.  Patient Profile     59 y.o. male atrial flutter (ablated 07/09/2018), OSA w/CPAP, HTN, HLD, DM, AFib, NICM (suspect tachy-mediated) non-obstructive CAD, admitted for Tikosyn initiation    Previous antiarrhythmic drugs: none Previous cardioversions: none Previous ablations: 07/09/18 flutter CHADS2VASC score: 4 (DM, HTN, CAD, CM) Anticoagulation history: Xarelto  Assessment & Plan    1. Paroxysmal AFib     CHA2DS2Vasc is 4, on Xarelto     Tikosyn initiation in progress      K+ 4.1      Mag 1.9 (replaced)      Creat 0.95      QT OK  He has gone into AFib, back in SR  now DCCV tomorrow if in AFib, though suspect will not proceed since he is in/out so far Pt is aware and agreeable if needed  2. HTN     Continue home meds  3. DM     Continue home regime  4. OSA     Has his home CPAP with him       For questions or updates, please contact [place] Please consult [email or web address] for contact info under   Signed, Baldwin Jamaica, PA-C  06/04/2019, 9:16 AM   EP Attending  Patient seen and examined. He remains in atrial fib and then back to NSR. I doubt DCCV will be indicated. Continue dofetilide. Follow QT.  Mikle Bosworth.D.

## 2019-06-05 ENCOUNTER — Encounter (HOSPITAL_COMMUNITY)
Admission: AD | Disposition: A | Discharge status: Home / Self Care | Payer: Self-pay | Source: Ambulatory Visit | Attending: Internal Medicine

## 2019-06-05 ENCOUNTER — Encounter: Payer: Self-pay | Admitting: Cardiovascular Disease

## 2019-06-05 LAB — BASIC METABOLIC PANEL
Anion gap: 7 (ref 5–15)
BUN: 12 mg/dL (ref 6–20)
CO2: 26 mmol/L (ref 22–32)
Calcium: 8.4 mg/dL — ABNORMAL LOW (ref 8.9–10.3)
Chloride: 104 mmol/L (ref 98–111)
Creatinine, Ser: 0.93 mg/dL (ref 0.61–1.24)
GFR calc Af Amer: >60 mL/min (ref >60)
GFR calc non Af Amer: >60 mL/min (ref >60)
Glucose, Bld: 108 mg/dL — ABNORMAL HIGH (ref 70–99)
Potassium: 3.9 mmol/L (ref 3.5–5.1)
Sodium: 137 mmol/L (ref 135–145)

## 2019-06-05 LAB — MAGNESIUM: Magnesium: 2 mg/dL (ref 1.7–2.4)

## 2019-06-05 SURGERY — CARDIOVERSION
Anesthesia: General

## 2019-06-05 MED ORDER — POTASSIUM CHLORIDE CRYS ER 20 MEQ PO TBCR
40.0000 meq | EXTENDED_RELEASE_TABLET | Freq: Once | ORAL | Status: AC
  Administered 2019-06-05: 40 meq via ORAL
  Filled 2019-06-05: qty 2

## 2019-06-05 MED ORDER — MAGNESIUM SULFATE 2 GM/50ML IV SOLN
2.0000 g | Freq: Once | INTRAVENOUS | Status: AC
  Administered 2019-06-05: 2 g via INTRAVENOUS
  Filled 2019-06-05: qty 50

## 2019-06-05 NOTE — Progress Notes (Addendum)
Progress Note  Patient Name: Ethan Pitts Date of Encounter: 06/05/2019  Primary Cardiologist: Dr. Stanford Breed  Subjective   Feels well, he saw the NSVT on the monitor but had no symptoms  Inpatient Medications    Scheduled Meds:  amLODipine  5 mg Oral Daily   atorvastatin  40 mg Oral Daily   benazepril  40 mg Oral Daily   carvedilol  25 mg Oral BID   dofetilide  500 mcg Oral BID   empagliflozin  10 mg Oral Daily   furosemide  40 mg Oral Daily   glipiZIDE  10 mg Oral Q breakfast   magnesium oxide  400 mg Oral Daily   metFORMIN  1,000 mg Oral BID WC   potassium chloride  40 mEq Oral Once   pregabalin  150 mg Oral BID   rivaroxaban  20 mg Oral Q breakfast   sodium chloride flush  3 mL Intravenous Q12H   tamsulosin  0.4 mg Oral Daily   Continuous Infusions:  sodium chloride     magnesium sulfate bolus IVPB     PRN Meds: sodium chloride, albuterol, ALPRAZolam, sodium chloride flush, zolpidem   Vital Signs    Vitals:   06/04/19 1403 06/04/19 2010 06/05/19 0000 06/05/19 0608  BP: 138/79 132/66 133/79 125/77  Pulse: 70 72 65 62  Resp: 16 18 19 20   Temp: 97.9 F (36.6 C) 98 F (36.7 C) 97.8 F (36.6 C) 98.3 F (36.8 C)  TempSrc: Oral Oral Oral Axillary  SpO2: 99% 99% 99% 98%  Weight:    134 kg  Height:        Intake/Output Summary (Last 24 hours) at 06/05/2019 0825 Last data filed at 06/04/2019 2015 Gross per 24 hour  Intake 684 ml  Output --  Net 684 ml   Last 3 Weights 06/05/2019 06/04/2019 06/03/2019  Weight (lbs) 295 lb 8 oz 297 lb 9.9 oz 299 lb 8 oz  Weight (kg) 134.038 kg 135 kg 135.852 kg      Telemetry    SR 70's, one NSMMVT- Personally Reviewed  ECG    SR 69, QT 410ms, QTc 462ms - Personally Reviewed  Physical Exam   Unchanged  GEN: No acute distress.   Neck: No JVD Cardiac: RRR, no murmurs, rubs, or gallops.  Respiratory: CTA b/l GI: Soft, nontender, non-distended, obese MS: No edema; No deformity. Neuro:  Nonfocal  Psych: Normal  affect   Labs    High Sensitivity Troponin:  No results for input(s): TROPONINIHS in the last 720 hours.    Chemistry Recent Labs  Lab 06/03/19 1131 06/04/19 0612 06/05/19 0336  NA 137 137 137  K 4.7 4.1 3.9  CL 105 106 104  CO2 23 25 26   GLUCOSE 191* 99 108*  BUN 12 12 12   CREATININE 0.96 0.95 0.93  CALCIUM 9.4 8.6* 8.4*  GFRNONAA >60 >60 >60  GFRAA >60 >60 >60  ANIONGAP 9 6 7      HematologyNo results for input(s): WBC, RBC, HGB, HCT, MCV, MCH, MCHC, RDW, PLT in the last 168 hours.  BNPNo results for input(s): BNP, PROBNP in the last 168 hours.   DDimer No results for input(s): DDIMER in the last 168 hours.   Radiology    No results found.  Cardiac Studies     LHC 03/22/18 Elevated left ventricular end-diastolic pressure, 21 mmHg.  EF 50%.  Findings consistent with diastolic dysfunction. Widely patent coronary arteries. Normal left main Normal LAD.  Ostial first diagonal 60% narrowed.  Normal circumflex Normal right coronary False positive myocardial perfusion study related to diaphragm attenuation   Echo 12/03/18 1. Left ventricular ejection fraction, by visual estimation, is 40 to 45%. The left ventricle has mildly decreased function. There is mildly increased left ventricular hypertrophy.  2. Left ventricular diastolic function could not be evaluated.  3. Global right ventricle was not well visualized.The right ventricular size is not well visualized. Right vetricular wall thickness was not assessed.  4. Left atrial size was normal.  5. Right atrial size was moderately dilated.  6. Mild to moderate aortic valve annular calcification.  7. The mitral valve is normal in structure. No evidence of mitral valve regurgitation. No evidence of mitral stenosis.  8. The tricuspid valve is grossly normal. Tricuspid valve regurgitation is not demonstrated.  9. The aortic valve is normal in structure. Aortic valve regurgitation is not visualized. No evidence of aortic valve  sclerosis or stenosis. 10. The pulmonic valve was normal in structure. Pulmonic valve regurgitation is not visualized. 11. The atrial septum is grossly normal.  Patient Profile     59 y.o. male atrial flutter (ablated 07/09/2018), OSA w/CPAP, HTN, HLD, DM, AFib, NICM (suspect tachy-mediated) non-obstructive CAD, admitted for Tikosyn initiation    Previous antiarrhythmic drugs: none Previous cardioversions: none Previous ablations: 07/09/18 flutter CHADS2VASC score: 4 (DM, HTN, CAD, CM) Anticoagulation history: Xarelto  Assessment & Plan    1. Paroxysmal AFib     CHA2DS2Vasc is 4, on Xarelto     Tikosyn initiation in progress      K+ 3.9 replaced      Mag 2.0      Creat 0.93      QT OK  NSVT yesterday 20beats, asymptomatic, reviewed with Dr. Lovena Le, [place] to continue Tikosyn  Has settled into SR, maintaining this AM, DCCV canceled Anticipate discharge home tomorrow  Appreciate case management team, no PA required   2. HTN     Continue home meds  3. DM     Continue home regime  4. OSA     Has his home CPAP with him       For questions or updates, please contact [place] Please consult [email or web address] for contact info under   Signed, Baldwin [place], PA-C  06/05/2019, 8:25 AM   EP Attending  Patient seen and examined. Agree with the findings as noted above. The patient has maintained NSR and will continue his dofetilide. His QT is ok. His NSVT is asymptomatic. We will followup with labs and an ECG in a week. I expect he will go home tomorrow.  Mikle Bosworth.D.

## 2019-06-05 NOTE — Discharge Summary (Addendum)
ELECTROPHYSIOLOGY PROCEDURE DISCHARGE SUMMARY    Patient ID: Ethan Pitts,  MRN: [id number], DOB/AGE: 1960-10-27 59 y.o.  Admit date: 06/03/2019 Discharge date: 06/06/2019  Primary Care Physician: Tammi Sou, MD Primary Cardiologist: Dr. Stanford Breed Electrophysiologist: Dr. Lovena Le  Primary Discharge Diagnosis:  1.  Paroxysmal atrial fibrillation status post Tikosyn loading this admission      CHA2DS2Vasc is 4, on Xarelto, appropriately dosed  Secondary Discharge Diagnosis:  1. OSA w/CPAP 2. Morbid obesity 3. HTN 4. DM 5. NICM  No Known Allergies   Procedures This Admission:  1.  Tikosyn loading    Brief HPI: Ethan Pitts is a 59 y.o. male with a past medical history as noted above.  He is followed outpatient by Dr. Lovena Le for his atrial fibrillation.  Risks, benefits, and alternatives to Tikosyn were reviewed with the patient who wished to proceed.    Hospital Course:  The patient was admitted and Tikosyn was initiated.  Renal function and electrolytes were followed during the hospitalization.  His QTc remained stable.  He arrived in SR, had some PAF during his stay, day of discharge his is SR.  The patient feels well, he was examined by Dr Lovena Le who considered him stable for discharge to home.  Follow-up has been arranged with the AFib clinic in 1 week and with Dr Lovena Le in 4 weeks.   Tikosyn teaching was completed   Will increase his mag ox to BID Unclear how, Tikosyn was listed as an outpt medication for him already, he does not know why.  He does not report any known active Rx for Tikosyn for him.  We spoke at length regarding this, he states understanding, has good medical knowledge, bing a EMTtech/paramedic.  Physical Exam: Vitals:   06/05/19 1415 06/05/19 1954 06/06/19 0410 06/06/19 0844  BP: 112/64 131/79 129/78 130/77  Pulse: 64 72 62   Resp: 16 20 17    Temp: 98.2 F (36.8 C) 98.3 F (36.8 C) 97.6 F (36.4 C)   TempSrc: Oral Oral  Oral   SpO2: 98% 96% 97%   Weight:   133.7 kg   Height:         GEN- The patient is well appearing, alert and oriented x 3 today.   HEENT: normocephalic, atraumatic; sclera clear, conjunctiva pink; hearing intact; oropharynx clear; neck supple, no JVP Lymph- no cervical lymphadenopathy Lungs- CTA b/l, normal work of breathing.  No wheezes, rales, rhonchi Heart- RRR, no murmurs, rubs or gallops, PMI not laterally displaced GI- soft, non-tender, non-distended, obese Extremities- no clubbing, cyanosis, or edema MS- no significant deformity or atrophy Skin- warm and dry, no rash or lesion Psych- euthymic mood, full affect Neuro- strength and sensation are intact   Labs:   Lab Results  Component Value Date   WBC 4.8 04/10/2019   HGB 9.9 (L) 04/10/2019   HCT 33.1 (L) 04/10/2019   MCV 86.2 04/10/2019   PLT 181 04/10/2019    Recent Labs  Lab 06/06/19 0526  NA 136  K 4.0  CL 101  CO2 27  BUN 13  CREATININE 0.89  CALCIUM 8.7*  GLUCOSE 98     Discharge Medications:  Allergies as of 06/06/2019   No Known Allergies      Medication List     TAKE these medications    albuterol 108 (90 Base) MCG/ACT inhaler Commonly known as: VENTOLIN HFA Inhale 2 puffs into the lungs every 6 (six) hours as needed for wheezing or shortness of breath.  ALPRAZolam 0.5 MG tablet Commonly known as: XANAX TAKE 1 TABLET BY MOUTH EVERY 8 HOURS AS NEEDED FOR SLEEP OR ANXIETY What changed:  how much to take how to take this when to take this reasons to take this additional instructions   amLODipine 5 MG tablet Commonly known as: NORVASC Take 1 tablet (5 mg total) by mouth daily.   atorvastatin 40 MG tablet Commonly known as: LIPITOR Take 1 tablet (40 mg total) by mouth daily. What changed: when to take this   benazepril 40 MG tablet Commonly known as: LOTENSIN Take 1 tablet (40 mg total) by mouth daily.   carvedilol 25 MG tablet Commonly known as: Coreg Take 1 tablet (25 mg  total) by mouth 2 (two) times daily.   dofetilide 500 MCG capsule Commonly known as: TIKOSYN Take 1 capsule (500 mcg total) by mouth 2 (two) times daily. What changed: when to take this   furosemide 40 MG tablet Commonly known as: LASIX Take 1 tablet by mouth once daily   glipiZIDE 10 MG 24 hr tablet Commonly known as: GLUCOTROL XL Take 1 tablet by mouth once daily with breakfast What changed: See the new instructions.   Insulin Pen Needle 31G X 5 MM Misc Use with Victoza as directed   Jardiance 10 MG Tabs tablet Generic drug: empagliflozin TAKE 1 TABLET BY MOUTH ONCE DAILY BEFORE BREAKFAST What changed: See the new instructions.   magnesium oxide 400 (241.3 Mg) MG tablet Commonly known as: MAG-OX Take 1 tablet (400 mg total) by mouth 2 (two) times daily. What changed: when to take this   metFORMIN 1000 MG tablet Commonly known as: GLUCOPHAGE TAKE 1 TABLET BY MOUTH TWICE DAILY WITH A MEAL What changed: See the new instructions.   pregabalin 150 MG capsule Commonly known as: LYRICA Take 1 capsule by mouth twice daily   tamsulosin 0.4 MG Caps capsule Commonly known as: FLOMAX Take 0.4 mg by mouth daily in the afternoon.   testosterone cypionate 200 MG/ML injection Commonly known as: DEPOTESTOSTERONE CYPIONATE 1 ml IM q 14 days What changed:  how much to take how to take this when to take this additional instructions   Victoza 18 MG/3ML Sopn Generic drug: liraglutide INJECT 1.2MG  SUBCUTANEOUSLY ONCE DAILY What changed:  how much to take how to take this when to take this additional instructions   Xarelto 20 MG Tabs tablet Generic drug: rivaroxaban TAKE 1 TABLET BY MOUTH ONCE DAILY WITH  SUPPER What changed: See the new instructions.   Xiaflex 0.9 MG Solr Generic drug: Collagenase Clostrid Histolyt   zaleplon 5 MG capsule Commonly known as: SONATA TAKE 1 CAPSULE BY MOUTH AT BEDTIME AS NEEDED FOR SLEEP What changed: See the new instructions.         Disposition: Home Discharge Instructions     Diet - low sodium heart healthy   Complete by: As directed    Increase activity slowly   Complete by: As directed       Follow-up Information     [facility] Follow up.   Specialty: Cardiology Why: 06/12/2019 @ 10:00AM with Maximino Greenland, NP Contact information: [address] [id number] [place]        Evans Lance, MD Follow up.   Specialty: Cardiology Why: 07/04/2019 @ 12:15PM Contact information: [id number] [place] [address] [phone]            Duration of Discharge Encounter: Greater than 30  minutes including physician time.  Venetia Night, PA-C 06/06/2019 12:02 PM  EP Attending  Patient seen and examined. Agree with the findings as noted above. The patient is doing well with initiation of dofetilide. He is maintaining NSR and his QT interval and electrolytes are within normal limits. He will be discharged home on meds as noted above. He will followup in a week for an ECG and labs  Abdiaziz Klahn,M.D

## 2019-06-05 NOTE — Progress Notes (Signed)
.   Pharmacy: Dofetilide (Tikosyn) - Follow Up Assessment and Electrolyte Replacement  Pharmacy consulted to assist in monitoring and replacing electrolytes in this 59 y.o. male admitted on 06/03/2019 undergoing dofetilide initiation. First dofetilide dose: 6/1@2011 .  Labs:    Component Value Date/Time   K 3.9 06/05/2019 0336   MG 2.0 06/05/2019 0336     Plan: Potassium: K 3.8-3.9:  Give KCl 40 mEq po x1   Magnesium: Mg 1.8-2: Give Mg 2 gm IV x1    Thank you for allowing pharmacy to participate in this patient's care   Antonietta Jewel, PharmD, [facility] Pharmacist  Phone: [phone] 06/05/2019 7:19 AM  Please check AMION for all [facility] phone numbers After 10:00 PM, call [facility] [phone]

## 2019-06-06 LAB — BASIC METABOLIC PANEL
Anion gap: 8 (ref 5–15)
BUN: 13 mg/dL (ref 6–20)
CO2: 27 mmol/L (ref 22–32)
Calcium: 8.7 mg/dL — ABNORMAL LOW (ref 8.9–10.3)
Chloride: 101 mmol/L (ref 98–111)
Creatinine, Ser: 0.89 mg/dL (ref 0.61–1.24)
GFR calc Af Amer: >60 mL/min (ref >60)
GFR calc non Af Amer: >60 mL/min (ref >60)
Glucose, Bld: 98 mg/dL (ref 70–99)
Potassium: 4 mmol/L (ref 3.5–5.1)
Sodium: 136 mmol/L (ref 135–145)

## 2019-06-06 LAB — MAGNESIUM: Magnesium: 1.9 mg/dL (ref 1.7–2.4)

## 2019-06-06 MED ORDER — MAGNESIUM SULFATE 2 GM/50ML IV SOLN: 2.0000 g | Freq: Once | INTRAVENOUS | Status: DC

## 2019-06-06 MED ORDER — DOFETILIDE 500 MCG PO CAPS: 500.0000 ug | ORAL_CAPSULE | Freq: Two times a day (BID) | ORAL | 6 refills | Status: DC

## 2019-06-06 MED ORDER — MAGNESIUM OXIDE 400 (241.3 MG) MG PO TABS: 400.0000 mg | ORAL_TABLET | Freq: Two times a day (BID) | ORAL | 3 refills | Status: DC

## 2019-06-06 MED FILL — MAGNESIUM OXIDE 400 MG TABS: 400 | 30 days supply | Qty: 60 | Fill #0

## 2019-06-06 MED FILL — DOFETILIDE 500 MCG CAPS: 500 | 30 days supply | Qty: 60 | Fill #0

## 2019-06-06 NOTE — Progress Notes (Signed)
Pharmacy: Dofetilide (Tikosyn) - Follow Up Assessment and Electrolyte Replacement  Pharmacy consulted to assist in monitoring and replacing electrolytes in this 59 y.o. male admitted on 06/03/2019 undergoing dofetilide initiation.   Labs:    Component Value Date/Time   K 4.0 06/06/2019 0526   MG 1.9 06/06/2019 0526     Plan: Potassium: K >/= 4: No additional supplementation needed  Magnesium: Mg 1.8-2: Give Mg 2 gm IV x1    Thank you for allowing pharmacy to participate in this patient's care   Hildred Laser, PharmD Clinical Pharmacist **Pharmacist phone directory can now be found on [email or web address] ([email or web address]).  Listed under [facility].

## 2019-06-10 NOTE — Addendum Note (Signed)
Addended by: Wonda Horner on: 06/10/2019 02:42 PM   Modules accepted: Orders

## 2019-06-11 ENCOUNTER — Ambulatory Visit (HOSPITAL_COMMUNITY)
Admit: 2019-06-11 | Discharge: 2019-06-11 | Disposition: A | Discharge status: Home / Self Care | Payer: Commercial Managed Care - PPO | Source: Ambulatory Visit | Attending: Nurse Practitioner | Admitting: Nurse Practitioner

## 2019-06-11 ENCOUNTER — Other Ambulatory Visit: Payer: Self-pay

## 2019-06-11 ENCOUNTER — Encounter (HOSPITAL_COMMUNITY): Payer: Self-pay | Admitting: Nurse Practitioner

## 2019-06-11 VITALS — BP 130/70 | HR 61 | Ht 73.0 in | Wt 290.0 lb

## 2019-06-11 DIAGNOSIS — E118 Type 2 diabetes mellitus with unspecified complications: Secondary | ICD-10-CM | POA: Diagnosis not present

## 2019-06-11 DIAGNOSIS — Z6838 Body mass index (BMI) 38.0-38.9, adult: Secondary | ICD-10-CM | POA: Insufficient documentation

## 2019-06-11 DIAGNOSIS — N401 Enlarged prostate with lower urinary tract symptoms: Secondary | ICD-10-CM | POA: Insufficient documentation

## 2019-06-11 DIAGNOSIS — Z79899 Other long term (current) drug therapy: Secondary | ICD-10-CM | POA: Insufficient documentation

## 2019-06-11 DIAGNOSIS — Z7901 Long term (current) use of anticoagulants: Secondary | ICD-10-CM | POA: Insufficient documentation

## 2019-06-11 DIAGNOSIS — E669 Obesity, unspecified: Secondary | ICD-10-CM | POA: Diagnosis not present

## 2019-06-11 DIAGNOSIS — F17220 Nicotine dependence, chewing tobacco, uncomplicated: Secondary | ICD-10-CM | POA: Insufficient documentation

## 2019-06-11 DIAGNOSIS — E291 Testicular hypofunction: Secondary | ICD-10-CM | POA: Insufficient documentation

## 2019-06-11 DIAGNOSIS — G4733 Obstructive sleep apnea (adult) (pediatric): Secondary | ICD-10-CM | POA: Insufficient documentation

## 2019-06-11 DIAGNOSIS — I5030 Unspecified diastolic (congestive) heart failure: Secondary | ICD-10-CM | POA: Insufficient documentation

## 2019-06-11 DIAGNOSIS — I2510 Atherosclerotic heart disease of native coronary artery without angina pectoris: Secondary | ICD-10-CM | POA: Diagnosis not present

## 2019-06-11 DIAGNOSIS — D6869 Other thrombophilia: Secondary | ICD-10-CM | POA: Diagnosis not present

## 2019-06-11 DIAGNOSIS — Z7984 Long term (current) use of oral hypoglycemic drugs: Secondary | ICD-10-CM | POA: Insufficient documentation

## 2019-06-11 DIAGNOSIS — Z8249 Family history of ischemic heart disease and other diseases of the circulatory system: Secondary | ICD-10-CM | POA: Insufficient documentation

## 2019-06-11 DIAGNOSIS — I480 Paroxysmal atrial fibrillation: Secondary | ICD-10-CM | POA: Insufficient documentation

## 2019-06-11 DIAGNOSIS — I4892 Unspecified atrial flutter: Secondary | ICD-10-CM | POA: Diagnosis not present

## 2019-06-11 DIAGNOSIS — I110 Hypertensive heart disease with heart failure: Secondary | ICD-10-CM | POA: Insufficient documentation

## 2019-06-11 DIAGNOSIS — Z9989 Dependence on other enabling machines and devices: Secondary | ICD-10-CM | POA: Insufficient documentation

## 2019-06-11 DIAGNOSIS — F419 Anxiety disorder, unspecified: Secondary | ICD-10-CM | POA: Insufficient documentation

## 2019-06-11 DIAGNOSIS — Z79890 Hormone replacement therapy (postmenopausal): Secondary | ICD-10-CM | POA: Diagnosis not present

## 2019-06-11 DIAGNOSIS — E785 Hyperlipidemia, unspecified: Secondary | ICD-10-CM | POA: Insufficient documentation

## 2019-06-11 LAB — BASIC METABOLIC PANEL
Anion gap: 9 (ref 5–15)
BUN: 13 mg/dL (ref 6–20)
CO2: 25 mmol/L (ref 22–32)
Calcium: 9.3 mg/dL (ref 8.9–10.3)
Chloride: 107 mmol/L (ref 98–111)
Creatinine, Ser: 1.24 mg/dL (ref 0.61–1.24)
GFR calc Af Amer: >60 mL/min (ref >60)
GFR calc non Af Amer: >60 mL/min (ref >60)
Glucose, Bld: 129 mg/dL — ABNORMAL HIGH (ref 70–99)
Potassium: 4.2 mmol/L (ref 3.5–5.1)
Sodium: 141 mmol/L (ref 135–145)

## 2019-06-11 LAB — MAGNESIUM: Magnesium: 2.1 mg/dL (ref 1.7–2.4)

## 2019-06-11 NOTE — Progress Notes (Signed)
Primary Care Physician: Tammi Sou, MD  Referring Physician: [facility] Primary Cardiologist: Dr Stanford Breed Primary EP: Dr Cleda Mccreedy is a 58 y.o. male with a history of atrial flutter, OSA, HTN, DM who presents for follow up in the [facility].  The patient was initially diagnosed with atrial flutter 02/03/18 when his Apple Watch showed that he had an irregular rhythm. 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 He had no awareness of his arrhythmia. Patient is s/p atrial flutter ablation 07/09/18 with Dr Lovena Le. Zio patch 12/2018 showed 35% afib burden with rapid rates at times. He was started back on Xarelto for a CHADS2VASC score of 4 by Dr Stanford Breed. Patient was seen at the ER 04/08/19 for hypotension. Patient states he was inadvertently taking metoprolol and carvedilol together. He was also found to be in afib but converted to SR after fluid bolus. Since stopping both carvedilol and metoprolol, he has noted some heart rates up to 110s. Prior to this, he states his afib has been well controlled with few episodes on his Apple Watch and always rate controlled. On follow up with Kerin Ransom 04/17/19, patient was in rate controlled atrial flutter but with several days of elevated heart rates on his Apple Watch.   On follow up today, patient presents for dofetilide admission. He reports he has done well since his last visit. He denies any missed doses of anticoagulation in the last 3 weeks.   F/u in afib  clinic, 6/9 for Tikosyn admit f/u. He was able to restore SR and was discharged on 500 mcg bid.He has been in SR since then. Qtc is stable today. He is feeling improved.  Today, he denies symptoms of palpitations, chest pain, SOB, orthopnea, PND, lower extremity edema, dizziness, presyncope, syncope, snoring, daytime somnolence, bleeding, or neurologic  sequela. The patient is tolerating medications without difficulties and is otherwise without complaint today.    Atrial Fibrillation Risk Factors:  he does have symptoms or diagnosis of sleep apnea. he is compliant with CPAP therapy. he does not have a history of rheumatic fever. he does not have a history of significant alcohol use.  he has a BMI of Body mass index is 38.26 kg/m.Marland Kitchen Filed Weights   06/11/19 1508  Weight: 131.5 kg     Atrial Fibrillation Management history:  Previous antiarrhythmic drugs: none Previous cardioversions: none Previous ablations: 07/09/18 flutter CHADS2VASC score: 4 (DM, HTN, CAD, CM) Anticoagulation history: Xarelto   Past Medical History:  Diagnosis Date   Anxiety    Arthritis    lower back    Atrial flutter ([place]) 2020   Cards->lopressor and anticoag, ablation. In sinus still as of 08/2018 cards f/u.  Recurrence + PAF->requiring pt to get back on xarelto   BPH with obstruction/lower urinary tract symptoms    started flomax via urol 03/2019   CAD (coronary artery disease) 03/2018   Ostial first diagonal 60% narrowed, otherwise normal coronaries. Dr. Stanford Breed added ASA and statin 08/2018.   Depression    Diabetes mellitus    False positive stress test 03/2018   Cath showed normal coronaries with EF 50%   GERD (gastroesophageal reflux disease)    Hyperlipidemia    Hypertension    Hypogonadism, male 05/2015   Increased prostate specific antigen (PSA) velocity 2019   06/2017 velocity up; recheck 10/2017 back to baseline.  Plan repeat 1 yr.   Low back pain 2016   Left lumbar radiculopathy, lumbar spondylolisthesis.  Sciatica episode 03/2016.  Ortho getting L spine MRI as of 07/2016.   Microcytic anemia 12/2015   suspect iron def due to malabsorption.  Hemoccults NEG 01/26/16, ferrous sulfate started.   Neuropathic pain of left foot    NICM (nonischemic cardiomyopathy) ([place]) 05/2018   EF 40-45%, +LV hypokinesis. Suspected to be  tachycardia-mediated->Dr. Stanford Breed to rpt echo fall 2020.   OSA on CPAP    cpap settings at 3   Peyronie's disease 2021   Dr. Porfirio Oar (collegenase intralesional injections)   Past Surgical History:  Procedure Laterality Date   A-FLUTTER ABLATION N/A 07/09/2018   Procedure: A-FLUTTER ABLATION;  Surgeon: Evans Lance, MD;  Location: [facility];  Service: Cardiovascular;  Laterality: N/A;   ADENOIDECTOMY     BREATH TEK H PYLORI N/A 01/08/2013   Procedure: [place];  Surgeon: [name], MD;  Location: [facility];  Service: General;  Laterality: N/A;   CARDIAC CATHETERIZATION  03/2018   No signi obstructive CAD (suspected FALSE POSITIVE STRESS TEST).  EF 50%.  Diastolic dysfunction.   CARDIOVASCULAR STRESS TEST  03/2018   Intermediate risk; EF 40%, medium sized defect with peri-infarct ischemia-->follow up cath showed no signif obstructive CAD.   COLONOSCOPY  04/17/2014   Benign polyp x 1.  Recall 10 yrs.   GASTRIC ROUX-EN-Y N/A 04/14/2013   Procedure: LAPAROSCOPIC ROUX-EN-Y GASTRIC BYPASS WITH UPPER ENDOSCOPY;  Surgeon: [name], MD;  Location: [facility];  Service: General;  Laterality: N/A;   LEFT HEART CATH AND CORONARY ANGIOGRAPHY N/A 03/22/2018   NO CAD.  EF 50%. Procedure: LEFT HEART CATH AND CORONARY ANGIOGRAPHY;  Surgeon: Belva Crome, MD;  Location: [facility];  Service: Cardiovascular;  Laterality: N/A;   Rhythm monitoring  01/08/2019   7 day monitor->Sinus rhythm with occasional PAC, PVC, paroxysmal atrial flutter, paroxysmal atrial fibrillation and 7 beats nonsustained ventricular tachycardia: xarelto restarted   TONSILLECTOMY     TRANSTHORACIC ECHOCARDIOGRAM  05/13/2018;12/03/18   05/2018 severe hypokinesis of LV inferior and inferolateral walls, EF 40-45%, correlates with stress test.  92/1194->RD 40-81%, diastolic fxn/LV filling pressure not determined due to pt in a fib/flutter    Current Outpatient Medications   Medication Sig Dispense Refill   albuterol (VENTOLIN HFA) 108 (90 Base) MCG/ACT inhaler Inhale 2 puffs into the lungs every 6 (six) hours as needed for wheezing or shortness of breath.      ALPRAZolam (XANAX) 0.5 MG tablet TAKE 1 TABLET BY MOUTH EVERY 8 HOURS AS NEEDED FOR SLEEP OR ANXIETY (Patient taking differently: Take 0.5 mg by mouth as needed for anxiety or sleep. ) 90 tablet 5   amLODipine (NORVASC) 5 MG tablet Take 1 tablet (5 mg total) by mouth daily. 90 tablet 3   aspirin EC 81 MG tablet Take 81 mg by mouth daily.     atorvastatin (LIPITOR) 40 MG tablet Take 1 tablet (40 mg total) by mouth daily. (Patient taking differently: Take 40 mg by mouth every evening. ) 90 tablet 3   benazepril (LOTENSIN) 40 MG tablet Take 1 tablet (40 mg total) by mouth daily. 30 tablet 0   carvedilol (COREG) 25 MG tablet Take 1 tablet (25 mg total) by mouth 2 (two) times daily. 60 tablet 6   dofetilide (TIKOSYN) 500 MCG capsule Take 1 capsule (500 mcg total) by mouth 2 (two) times daily. 60 capsule 6  furosemide (LASIX) 40 MG tablet Take 1 tablet by mouth once daily (Patient taking differently: Take 40 mg by mouth daily. He takes this medication 4 days a week) 90 tablet 0   glipiZIDE (GLUCOTROL XL) 10 MG 24 hr tablet Take 1 tablet by mouth once daily with breakfast (Patient taking differently: Take 10 mg by mouth daily with breakfast. ) 30 tablet 0   Insulin Pen Needle 31G X 5 MM MISC Use with Victoza as directed 100 each 11   JARDIANCE 10 MG TABS tablet TAKE 1 TABLET BY MOUTH ONCE DAILY BEFORE BREAKFAST (Patient taking differently: Take 10 mg by mouth daily before breakfast. ) 30 tablet 0   liraglutide (VICTOZA) 18 MG/3ML SOPN INJECT 1.2MG  SUBCUTANEOUSLY ONCE DAILY (Patient taking differently: Inject 1.2 mg into the skin daily. ) 15 mL 6   magnesium oxide (MAG-OX) 400 (241.3 Mg) MG tablet Take 1 tablet (400 mg total) by mouth 2 (two) times daily. 60 tablet 3   metFORMIN (GLUCOPHAGE) 1000 MG  tablet TAKE 1 TABLET BY MOUTH TWICE DAILY WITH A MEAL (Patient taking differently: Take 1,000 mg by mouth 2 (two) times daily with a meal. ) 180 tablet 3   pregabalin (LYRICA) 150 MG capsule Take 1 capsule by mouth twice daily (Patient taking differently: Take 150 mg by mouth 2 (two) times daily. ) 60 capsule 5   tamsulosin (FLOMAX) 0.4 MG CAPS capsule Take 0.4 mg by mouth daily in the afternoon.      testosterone cypionate (DEPOTESTOSTERONE CYPIONATE) 200 MG/ML injection 1 ml IM q 14 days (Patient taking differently: Inject 200 mg into the muscle every 14 (fourteen) days. 1 ml) 10 mL 0   XARELTO 20 MG TABS tablet TAKE 1 TABLET BY MOUTH ONCE DAILY WITH  SUPPER (Patient taking differently: Take 20 mg by mouth every morning. ) 90 tablet 2   XIAFLEX 0.9 MG SOLR      zaleplon (SONATA) 5 MG capsule TAKE 1 CAPSULE BY MOUTH AT BEDTIME AS NEEDED FOR SLEEP (Patient taking differently: Take 5 mg by mouth at bedtime as needed for sleep. ) 90 capsule 1   No current facility-administered medications for this encounter.    No Known Allergies  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EMT    Employer: [employer]  Tobacco Use   Smoking status: Never Smoker   Smokeless tobacco: Former Systems developer    Types: Chew  Substance and Sexual Activity   Alcohol use: Yes    Alcohol/week: 18.0 standard drinks    Types: 18 Cans of beer per week    Comment: 18   Drug use: No   Sexual activity: Not on file  Other Topics Concern   Not on file  Social History Narrative   Married, no children.   Occupation: paramedic   Social Determinants of Radio broadcast assistant Strain:    Difficulty of Paying Living Expenses:   Food Insecurity:    Worried About Charity fundraiser in the  Last Year:    Arboriculturist in the Last Year:   Transportation Needs:    Film/video editor (Medical):    Lack of Transportation (Non-Medical):   Physical Activity:    Days of Exercise per Week:  Minutes of Exercise per Session:   Stress:    Feeling of Stress :   Social Connections:    Frequency of Communication with Friends and Family:    Frequency of Social Gatherings with Friends and Family:    Attends Religious Services:    Active Member of Clubs or Organizations:    Attends Music therapist:    Marital Status:   Intimate Partner Violence:    Fear of Current or Ex-Partner:    Emotionally Abused:    Physically Abused:    Sexually Abused:     Family History  Problem Relation Age of Onset   Heart disease Father        valvular disease   COPD Father    Prostate cancer Father    Arrhythmia Father    Colon cancer Neg Hx    The patient does not have a history of early familial atrial fibrillation or other arrhythmias.  ROS- All systems are reviewed and negative except as per the HPI above.  Physical Exam: Vitals:   06/11/19 1508  BP: 130/70  Pulse: 61  Weight: 131.5 kg  Height: 6\' 1"  (1.854 m)    GEN- The patient is well appearing obese male, alert and oriented x 3 today.   HEENT-head normocephalic, atraumatic, sclera clear, conjunctiva pink, hearing intact, trachea midline. Lungs- Clear to ausculation bilaterally, normal work of breathing Heart- Regular rate and rhythm, no murmurs, rubs or gallops  GI- soft, NT, ND, + BS Extremities- no clubbing, cyanosis, or edema MS- no significant deformity or atrophy Skin- no rash or lesion Psych- euthymic mood, full affect Neuro- strength and sensation are intact   Wt Readings from Last 3 Encounters:  06/11/19 131.5 kg  06/06/19 133.7 kg  06/03/19 136.1 kg    EKG today demonstrates SR HR 61, PR 168, QRS 112, QTc 471  Epic records are reviewed at length today  [facility]  03/22/18  Elevated left ventricular end-diastolic pressure, 21 mmHg. EF 50%. Findings consistent with diastolic dysfunction.  Widely patent coronary arteries.  Normal left main  Normal LAD. Ostial first diagonal 60% narrowed.  Normal circumflex  Normal right coronary  False positive myocardial perfusion study related to diaphragm attenuation  Echo 12/03/18 1. Left ventricular ejection fraction, by visual estimation, is 40 to 45%. The left ventricle has mildly decreased function. There is mildly increased left ventricular hypertrophy.  2. Left ventricular diastolic function could not be evaluated.  3. Global right ventricle was not well visualized.The right ventricular size is not well visualized. Right vetricular wall thickness was not assessed.  4. Left atrial size was normal.  5. Right atrial size was moderately dilated.  6. Mild to moderate aortic valve annular calcification.  7. The mitral valve is normal in structure. No evidence of mitral valve regurgitation. No evidence of mitral stenosis.  8. The tricuspid valve is grossly normal. Tricuspid valve regurgitation is not demonstrated.  9. The aortic valve is normal in structure. Aortic valve regurgitation is not visualized. No evidence of aortic valve sclerosis or stenosis. 10. The pulmonic valve was normal in structure. Pulmonic valve regurgitation is not visualized. 11. The atrial septum is grossly normal.   Assessment and Plan:  1. Paroxysmal atrial fibrillation/Typical atrial flutter S/p atrial flutter ablation with Dr Lovena Le 07/09/18 Now  s/p Tikosyn load and is staying in SR  No benadryl use QTc is stable at 471 ms General precautions reviewed with tikosyn Bmet/mag today  Continue Xarelto 20 mg daily.   Continue  Coreg 25 mg BID Lifestyle changes as below. Apple watch for monitoring of heart rate.   This patients CHA2DS2-VASc Score and unadjusted Ischemic Stroke Rate (% per year) is equal to 4.8 % stroke rate/year  from a score of 4  Above score calculated as 1 point each if present [CHF, HTN, DM, Vascular=MI/PAD/Aortic Plaque, Age if 65-74, or Male] Above score calculated as 2 points each if present [Age > 75, or Stroke/TIA/TE]  2. Obesity Body mass index is 38.26 kg/m. Lifestyle modification was discussed and encouraged including regular physical activity and weight reduction.  3. Obstructive sleep apnea Patient compliant with CPAP therapy. Followed by Dr Claiborne Billings  4. HTN Stable, no changes today.   F/u with Dr. Lovena Le 7/2 and R. [place], [place] 8/24   Adline Peals PA-C [facility] [address] [place], [place] [address] [phone] 06/11/2019 4:22 PM

## 2019-06-12 ENCOUNTER — Ambulatory Visit (HOSPITAL_COMMUNITY): Payer: Commercial Managed Care - PPO | Admitting: Nurse Practitioner

## 2019-06-17 ENCOUNTER — Telehealth: Payer: Self-pay | Admitting: Cardiovascular Disease

## 2019-06-17 NOTE — Telephone Encounter (Signed)
Information was faxed to [facility] on 05/30/19. Refaxed again today 06/17/19

## 2019-06-17 NOTE — Telephone Encounter (Signed)
Pt had an appointment with Dr. Claiborne Billings 05/30/19 and has not heard anything in regards to his CPAP machine yet. He is concerned because it has been over two weeks since his appointment. Please call the patient

## 2019-06-17 NOTE — Telephone Encounter (Signed)
Message routed to sleep coordinator Mariann Laster

## 2019-06-19 ENCOUNTER — Other Ambulatory Visit: Payer: Self-pay | Admitting: Family Medicine

## 2019-06-20 ENCOUNTER — Other Ambulatory Visit: Payer: Self-pay | Admitting: Family Medicine

## 2019-06-23 ENCOUNTER — Other Ambulatory Visit: Payer: Self-pay | Admitting: Family Medicine

## 2019-06-25 ENCOUNTER — Other Ambulatory Visit: Payer: Self-pay | Admitting: Family Medicine

## 2019-07-04 ENCOUNTER — Encounter: Payer: Self-pay | Admitting: Internal Medicine

## 2019-07-04 ENCOUNTER — Other Ambulatory Visit: Payer: Self-pay

## 2019-07-04 ENCOUNTER — Ambulatory Visit (INDEPENDENT_AMBULATORY_CARE_PROVIDER_SITE_OTHER): Payer: Commercial Managed Care - PPO | Admitting: Internal Medicine

## 2019-07-04 VITALS — BP 142/76 | HR 73 | Ht 73.0 in

## 2019-07-04 DIAGNOSIS — I4892 Unspecified atrial flutter: Secondary | ICD-10-CM

## 2019-07-04 DIAGNOSIS — I10 Essential (primary) hypertension: Secondary | ICD-10-CM | POA: Diagnosis not present

## 2019-07-04 MED ORDER — BENAZEPRIL HCL 40 MG PO TABS: 40.0000 mg | ORAL_TABLET | Freq: Every day | ORAL | 3 refills | Status: DC

## 2019-07-04 MED ORDER — AMLODIPINE BESYLATE 5 MG PO TABS: 5.0000 mg | ORAL_TABLET | Freq: Every day | ORAL | 3 refills | Status: DC

## 2019-07-04 MED ORDER — MAGNESIUM OXIDE 400 (241.3 MG) MG PO TABS: 400.0000 mg | ORAL_TABLET | Freq: Two times a day (BID) | ORAL | 3 refills | Status: DC

## 2019-07-04 NOTE — Progress Notes (Signed)
HPI Ethan Pitts returns today for followup of PAF. He is a pleasant 59 yo man with morbid obesity, s/p bariatric surgery, who has atial fib. He was hospitalized several weeks ago with initiation of dofetilide. He notes some peripheral edema but admits to dietary indiscretion. He just got back from the beach. He has occaisional palpitations but feels better and has more energy since he was hospitalized with atrial fib.   No Known Allergies   Current Outpatient Medications  Medication Sig Dispense Refill  . albuterol (VENTOLIN HFA) 108 (90 Base) MCG/ACT inhaler Inhale 2 puffs into the lungs every 6 (six) hours as needed for wheezing or shortness of breath.     . ALPRAZolam (XANAX) 0.5 MG tablet TAKE 1 TABLET BY MOUTH EVERY 8 HOURS AS NEEDED FOR SLEEP OR ANXIETY 90 tablet 5  . amLODipine (NORVASC) 5 MG tablet Take 1 tablet (5 mg total) by mouth daily. 90 tablet 3  . aspirin EC 81 MG tablet Take 81 mg by mouth daily.    Marland Kitchen atorvastatin (LIPITOR) 40 MG tablet Take 40 mg by mouth daily.    . benazepril (LOTENSIN) 40 MG tablet Take 1 tablet (40 mg total) by mouth daily. 90 tablet 3  . carvedilol (COREG) 25 MG tablet Take 1 tablet (25 mg total) by mouth 2 (two) times daily. 60 tablet 6  . dofetilide (TIKOSYN) 500 MCG capsule Take 1 capsule (500 mcg total) by mouth 2 (two) times daily. 60 capsule 6  . furosemide (LASIX) 40 MG tablet Take 1 tablet by mouth once daily 90 tablet 0  . glipiZIDE (GLUCOTROL XL) 10 MG 24 hr tablet Take 1 tablet by mouth once daily with breakfast 30 tablet 0  . Insulin Pen Needle 31G X 5 MM MISC Use with Victoza as directed 100 each 11  . JARDIANCE 10 MG TABS tablet TAKE 1 TABLET BY MOUTH ONCE DAILY BEFORE BREAKFAST 30 tablet 0  . liraglutide (VICTOZA) 18 MG/3ML SOPN INJECT 1.2MG  SUBCUTANEOUSLY ONCE DAILY 15 mL 6  . magnesium oxide (MAG-OX) 400 (241.3 Mg) MG tablet Take 1 tablet (400 mg total) by mouth 2 (two) times daily. 180 tablet 3  . metFORMIN (GLUCOPHAGE) 1000 MG  tablet TAKE 1 TABLET BY MOUTH TWICE DAILY WITH A MEAL 180 tablet 3  . pregabalin (LYRICA) 150 MG capsule Take 1 capsule by mouth twice daily 60 capsule 5  . tamsulosin (FLOMAX) 0.4 MG CAPS capsule Take 0.4 mg by mouth daily in the afternoon.     Marland Kitchen testosterone cypionate (DEPOTESTOSTERONE CYPIONATE) 200 MG/ML injection 1 ml IM q 14 days 10 mL 0  . XARELTO 20 MG TABS tablet TAKE 1 TABLET BY MOUTH ONCE DAILY WITH  SUPPER 90 tablet 2  . XIAFLEX 0.9 MG SOLR     . zaleplon (SONATA) 5 MG capsule TAKE 1 CAPSULE BY MOUTH AT BEDTIME AS NEEDED FOR SLEEP 90 capsule 1   No current facility-administered medications for this visit.     Past Medical History:  Diagnosis Date  . Anxiety   . Arthritis    lower back   . Atrial flutter ([place]) 2020   Cards->lopressor and anticoag, ablation. In sinus still as of 08/2018 cards f/u.  Recurrence + PAF->requiring pt to get back on xarelto  . BPH with obstruction/lower urinary tract symptoms    started flomax via urol 03/2019  . CAD (coronary artery disease) 03/2018   Ostial first diagonal 60% narrowed, otherwise normal coronaries. Dr. Stanford Breed added ASA and statin  08/2018.  Marland Kitchen Depression   . Diabetes mellitus   . False positive stress test 03/2018   Cath showed normal coronaries with EF 50%  . GERD (gastroesophageal reflux disease)   . Hyperlipidemia   . Hypertension   . Hypogonadism, male 05/2015  . Increased prostate specific antigen (PSA) velocity 2019   06/2017 velocity up; recheck 10/2017 back to baseline.  Plan repeat 1 yr.  . Low back pain 2016   Left lumbar radiculopathy, lumbar spondylolisthesis.  Sciatica episode 03/2016.  Ortho getting L spine MRI as of 07/2016.  . Microcytic anemia 12/2015   suspect iron def due to malabsorption.  Hemoccults NEG 01/26/16, ferrous sulfate started.  . Neuropathic pain of left foot   . NICM (nonischemic cardiomyopathy) ([place]) 05/2018   EF 40-45%, +LV hypokinesis. Suspected to be tachycardia-mediated->Dr. Stanford Breed to rpt  echo fall 2020.  Marland Kitchen OSA on CPAP    cpap settings at 3  . Peyronie's disease 2021   Dr. Porfirio Oar (collegenase intralesional injections)    ROS:   All systems reviewed and negative except as noted in the HPI.   Past Surgical History:  Procedure Laterality Date  . A-FLUTTER ABLATION N/A 07/09/2018   Procedure: A-FLUTTER ABLATION;  Surgeon: Evans Lance, MD;  Location: [facility];  Service: Cardiovascular;  Laterality: N/A;  . ADENOIDECTOMY    . BREATH TEK H PYLORI N/A 01/08/2013   Procedure: [place];  Surgeon: [name], MD;  Location: [facility];  Service: General;  Laterality: N/A;  . CARDIAC CATHETERIZATION  03/2018   No signi obstructive CAD (suspected FALSE POSITIVE STRESS TEST).  EF 50%.  Diastolic dysfunction.  Marland Kitchen CARDIOVASCULAR STRESS TEST  03/2018   Intermediate risk; EF 40%, medium sized defect with peri-infarct ischemia-->follow up cath showed no signif obstructive CAD.  Marland Kitchen COLONOSCOPY  04/17/2014   Benign polyp x 1.  Recall 10 yrs.  Marland Kitchen GASTRIC ROUX-EN-Y N/A 04/14/2013   Procedure: LAPAROSCOPIC ROUX-EN-Y GASTRIC BYPASS WITH UPPER ENDOSCOPY;  Surgeon: [name], MD;  Location: [facility];  Service: General;  Laterality: N/A;  . LEFT HEART CATH AND CORONARY ANGIOGRAPHY N/A 03/22/2018   NO CAD.  EF 50%. Procedure: LEFT HEART CATH AND CORONARY ANGIOGRAPHY;  Surgeon: Belva Crome, MD;  Location: [facility];  Service: Cardiovascular;  Laterality: N/A;  . Rhythm monitoring  01/08/2019   7 day monitor->Sinus rhythm with occasional PAC, PVC, paroxysmal atrial flutter, paroxysmal atrial fibrillation and 7 beats nonsustained ventricular tachycardia: xarelto restarted  . TONSILLECTOMY    . TRANSTHORACIC ECHOCARDIOGRAM  05/13/2018;12/03/18   05/2018 severe hypokinesis of LV inferior and inferolateral walls, EF 40-45%, correlates with stress test.  69/4854->OE 70-35%, diastolic fxn/LV filling pressure not determined due to pt in a fib/flutter      Family History  Problem Relation Age of Onset  . Heart disease Father        valvular disease  . COPD Father   . Prostate cancer Father   . Arrhythmia Father   . Colon cancer Neg Hx      Social History   Socioeconomic History  . Marital status: Divorced    Spouse name: Not on file  . Number of children: Not on file  . Years of education: Not on file  . Highest education level: Not on file  Occupational History  . Occupation: Paramedic-EMT    Employer: [employer]  Tobacco Use  . Smoking status: Never Smoker  . Smokeless tobacco: Former Systems developer  Types: Chew  Substance and Sexual Activity  . Alcohol use: Yes    Alcohol/week: 18.0 standard drinks    Types: 18 Cans of beer per week    Comment: 18  . Drug use: No  . Sexual activity: Not on file  Other Topics Concern  . Not on file  Social History Narrative   Married, no children.   Occupation: paramedic   Social Determinants of Radio broadcast assistant Strain:   . Difficulty of Paying Living Expenses:   Food Insecurity:   . Worried About Charity fundraiser in the Last Year:   . Arboriculturist in the Last Year:   Transportation Needs:   . Film/video editor (Medical):   Marland Kitchen Lack of Transportation (Non-Medical):   Physical Activity:   . Days of Exercise per Week:   . Minutes of Exercise per Session:   Stress:   . Feeling of Stress :   Social Connections:   . Frequency of Communication with Friends and Family:   . Frequency of Social Gatherings with Friends and Family:   . Attends Religious Services:   . Active Member of Clubs or Organizations:   . Attends Archivist Meetings:   Marland Kitchen Marital Status:   Intimate Partner Violence:   . Fear of Current or Ex-Partner:   . Emotionally Abused:   Marland Kitchen Physically Abused:   . Sexually Abused:      BP (!) 142/76   Pulse 73   Ht 6\' 1"  (1.854 m)   SpO2 92%   BMI 38.26 kg/m   Physical Exam:  Well appearing NAD HEENT:  Unremarkable Neck:  No JVD, no thyromegally Lymphatics:  No adenopathy Back:  No CVA tenderness Lungs:  Clear with no wheezes HEART:  Regular rate rhythm, no murmurs, no rubs, no clicks Abd:  soft, positive bowel sounds, no organomegally, no rebound, no guarding Ext:  2 plus pulses, 2+ edema, no cyanosis, no clubbing Skin:  No rashes no nodules Neuro:  CN II through XII intact, motor grossly intact  EKG - nsr  Assess/Plan: 1. PAF - he appears to be maintaining NSR. He will continue dofetilide.  2. Obesity - I encouraged the patient to work on losing weight.No limit to activity at this point. 3. HTN - - his sbp is up a bit today. He is encouraged to reduce his salt intake. 4. Atrial flutter - he is maintaining NSR. No additional flutter since his ablation.   Mikle Bosworth.D.

## 2019-07-04 NOTE — Patient Instructions (Signed)
Medication Instructions:  Your physician recommends that you continue on your current medications as directed. Please refer to the Current Medication list given to you today.  *If you need a refill on your cardiac medications before your next appointment, please call your pharmacy*  Lab Work: None ordered.  If you have labs (blood work) drawn today and your tests are completely normal, you will receive your results only by: Marland Kitchen MyChart Message (if you have MyChart) OR . A paper copy in the mail If you have any lab test that is abnormal or we need to change your treatment, we will call you to review the results.  Testing/Procedures: None ordered.  Follow-Up: At [facility] you and your health needs are our priority.  As part of our continuing mission to provide you with exceptional heart care, we have created designated Provider Care Teams.  These Care Teams include your primary Cardiologist (physician) and Advanced Practice Providers (APPs -  Physician Assistants and Nurse Practitioners) who all work together to provide you with the care you need, when you need it.  We recommend signing up for the patient portal called "MyChart".  Sign up information is provided on this After Visit Summary.  MyChart is used to connect with patients for Virtual Visits (Telemedicine).  Patients are able to view lab/test results, encounter notes, upcoming appointments, etc.  Non-urgent messages can be sent to your provider as well.   To learn more about what you can do with MyChart, go to [email or web address].    Your next appointment:   Your physician wants you to follow-up in: 1 year with Dr. Lovena Le. You will receive a reminder letter in the mail two months in advance. If you don't receive a letter, please call our office to schedule the follow-up appointment.   Other Instructions:

## 2019-07-18 ENCOUNTER — Other Ambulatory Visit (HOSPITAL_COMMUNITY): Payer: Self-pay | Admitting: *Deleted

## 2019-07-18 MED ORDER — CARVEDILOL 25 MG PO TABS: 25.0000 mg | ORAL_TABLET | Freq: Two times a day (BID) | ORAL | 6 refills | Status: DC

## 2019-08-11 ENCOUNTER — Telehealth: Payer: Self-pay | Admitting: Cardiovascular Disease

## 2019-08-11 NOTE — Telephone Encounter (Signed)
Patient is calling about his CPAP, he wants to know where we stand.

## 2019-08-26 ENCOUNTER — Ambulatory Visit (HOSPITAL_COMMUNITY)
Admission: RE | Admit: 2019-08-26 | Discharge: 2019-08-26 | Disposition: A | Discharge status: Home / Self Care | Payer: Commercial Managed Care - PPO | Source: Ambulatory Visit | Attending: Physician Assistant | Admitting: Physician Assistant

## 2019-08-26 ENCOUNTER — Encounter (HOSPITAL_COMMUNITY): Payer: Self-pay | Admitting: Physician Assistant

## 2019-08-26 ENCOUNTER — Other Ambulatory Visit: Payer: Self-pay

## 2019-08-26 VITALS — BP 132/70 | HR 70 | Ht 73.0 in | Wt 293.8 lb

## 2019-08-26 DIAGNOSIS — R9431 Abnormal electrocardiogram [ECG] [EKG]: Secondary | ICD-10-CM | POA: Insufficient documentation

## 2019-08-26 DIAGNOSIS — Z6838 Body mass index (BMI) 38.0-38.9, adult: Secondary | ICD-10-CM | POA: Diagnosis not present

## 2019-08-26 DIAGNOSIS — Z7982 Long term (current) use of aspirin: Secondary | ICD-10-CM | POA: Insufficient documentation

## 2019-08-26 DIAGNOSIS — I10 Essential (primary) hypertension: Secondary | ICD-10-CM | POA: Diagnosis not present

## 2019-08-26 DIAGNOSIS — Z79899 Other long term (current) drug therapy: Secondary | ICD-10-CM | POA: Insufficient documentation

## 2019-08-26 DIAGNOSIS — G4733 Obstructive sleep apnea (adult) (pediatric): Secondary | ICD-10-CM | POA: Insufficient documentation

## 2019-08-26 DIAGNOSIS — E669 Obesity, unspecified: Secondary | ICD-10-CM | POA: Diagnosis not present

## 2019-08-26 DIAGNOSIS — Z7901 Long term (current) use of anticoagulants: Secondary | ICD-10-CM | POA: Diagnosis not present

## 2019-08-26 DIAGNOSIS — I480 Paroxysmal atrial fibrillation: Secondary | ICD-10-CM | POA: Diagnosis present

## 2019-08-26 DIAGNOSIS — I4892 Unspecified atrial flutter: Secondary | ICD-10-CM | POA: Insufficient documentation

## 2019-08-26 DIAGNOSIS — D6869 Other thrombophilia: Secondary | ICD-10-CM

## 2019-08-26 DIAGNOSIS — Z794 Long term (current) use of insulin: Secondary | ICD-10-CM | POA: Diagnosis not present

## 2019-08-26 MED ORDER — DOFETILIDE 500 MCG PO CAPS: 500.0000 ug | ORAL_CAPSULE | Freq: Two times a day (BID) | ORAL | 2 refills | Status: DC

## 2019-08-26 MED ORDER — MAGNESIUM OXIDE 400 (241.3 MG) MG PO TABS: 400.0000 mg | ORAL_TABLET | Freq: Two times a day (BID) | ORAL | 3 refills | Status: DC

## 2019-08-26 NOTE — Progress Notes (Signed)
Primary Care Physician: Jettie Booze, NP  Referring Physician: [facility] Primary Cardiologist: Dr Stanford Breed Primary EP: Dr Cleda Mccreedy is a 59 y.o. male with a history of atrial flutter, OSA, HTN, DM who presents for follow up in the [facility].  The patient was initially diagnosed with atrial flutter 02/03/18 when his Apple Watch showed that he had an irregular rhythm. 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 He had no awareness of his arrhythmia. Patient is s/p atrial flutter ablation 07/09/18 with Dr Lovena Le. Zio patch 12/2018 showed 35% afib burden with rapid rates at times. He was started back on Xarelto for a CHADS2VASC score of 4 by Dr Stanford Breed. Patient was seen at the ER 04/08/19 for hypotension. Patient states he was inadvertently taking metoprolol and carvedilol together. He was also found to be in afib but converted to SR after fluid bolus. Since stopping both carvedilol and metoprolol, he has noted some heart rates up to 110s. Prior to this, he states his afib has been well controlled with few episodes on his Apple Watch and always rate controlled. On follow up with Kerin Ransom 04/17/19, patient was in rate controlled atrial flutter but with several days of elevated heart rates on his Apple Watch.   On follow up today, patient reports that he has done well since his last visit. He denies any episodes of afib noted on his smart watch. He reports that he feels like he has more energy overall. He is tolerating dofetilide without difficulty. He denies bleeding issues on anticoagulation.  Today, he denies symptoms of palpitations, chest pain, SOB, orthopnea, PND, lower extremity edema, dizziness, presyncope, syncope, snoring, daytime somnolence, bleeding, or neurologic sequela. The patient is tolerating medications without difficulties and is otherwise  without complaint today.    Atrial Fibrillation Risk Factors:  he does have symptoms or diagnosis of sleep apnea. he is compliant with CPAP therapy. he does not have a history of rheumatic fever. he does not have a history of significant alcohol use.  he has a BMI of Body mass index is 38.76 kg/m.Marland Kitchen Filed Weights   08/26/19 1118  Weight: 133.3 kg     Atrial Fibrillation Management history:  Previous antiarrhythmic drugs: dofetilide  Previous cardioversions: none Previous ablations: 07/09/18 flutter CHADS2VASC score: 4 (DM, HTN, CAD, CM) Anticoagulation history: Xarelto   Past Medical History:  Diagnosis Date  . Anxiety   . Arthritis    lower back   . Atrial flutter ([place]) 2020   Cards->lopressor and anticoag, ablation. In sinus still as of 08/2018 cards f/u.  Recurrence + PAF->requiring pt to get back on xarelto  . BPH with obstruction/lower urinary tract symptoms    started flomax via urol 03/2019  . CAD (coronary artery disease) 03/2018   Ostial first diagonal 60% narrowed, otherwise normal coronaries. Dr. Stanford Breed added ASA and statin 08/2018.  Marland Kitchen Depression   . Diabetes mellitus   . False positive stress test 03/2018   Cath showed normal coronaries with EF 50%  . GERD (gastroesophageal reflux disease)   . Hyperlipidemia   . Hypertension   . Hypogonadism, male 05/2015  . Increased prostate specific antigen (PSA) velocity 2019   06/2017 velocity up; recheck 10/2017 back to baseline.  Plan repeat 1 yr.  . Low back pain 2016   Left lumbar radiculopathy, lumbar spondylolisthesis.  Sciatica  episode 03/2016.  Ortho getting L spine MRI as of 07/2016.  . Microcytic anemia 12/2015   suspect iron def due to malabsorption.  Hemoccults NEG 01/26/16, ferrous sulfate started.  . Neuropathic pain of left foot   . NICM (nonischemic cardiomyopathy) ([place]) 05/2018   EF 40-45%, +LV hypokinesis. Suspected to be tachycardia-mediated->Dr. Stanford Breed to rpt echo fall 2020.  Marland Kitchen OSA on CPAP    cpap  settings at 3  . Peyronie's disease 2021   Dr. Porfirio Oar (collegenase intralesional injections)   Past Surgical History:  Procedure Laterality Date  . A-FLUTTER ABLATION N/A 07/09/2018   Procedure: A-FLUTTER ABLATION;  Surgeon: Evans Lance, MD;  Location: [facility];  Service: Cardiovascular;  Laterality: N/A;  . ADENOIDECTOMY    . BREATH TEK H PYLORI N/A 01/08/2013   Procedure: [place];  Surgeon: [name], MD;  Location: [facility];  Service: General;  Laterality: N/A;  . CARDIAC CATHETERIZATION  03/2018   No signi obstructive CAD (suspected FALSE POSITIVE STRESS TEST).  EF 50%.  Diastolic dysfunction.  Marland Kitchen CARDIOVASCULAR STRESS TEST  03/2018   Intermediate risk; EF 40%, medium sized defect with peri-infarct ischemia-->follow up cath showed no signif obstructive CAD.  Marland Kitchen COLONOSCOPY  04/17/2014   Benign polyp x 1.  Recall 10 yrs.  Marland Kitchen GASTRIC ROUX-EN-Y N/A 04/14/2013   Procedure: LAPAROSCOPIC ROUX-EN-Y GASTRIC BYPASS WITH UPPER ENDOSCOPY;  Surgeon: [name], MD;  Location: [facility];  Service: General;  Laterality: N/A;  . LEFT HEART CATH AND CORONARY ANGIOGRAPHY N/A 03/22/2018   NO CAD.  EF 50%. Procedure: LEFT HEART CATH AND CORONARY ANGIOGRAPHY;  Surgeon: Belva Crome, MD;  Location: [facility];  Service: Cardiovascular;  Laterality: N/A;  . Rhythm monitoring  01/08/2019   7 day monitor->Sinus rhythm with occasional PAC, PVC, paroxysmal atrial flutter, paroxysmal atrial fibrillation and 7 beats nonsustained ventricular tachycardia: xarelto restarted  . TONSILLECTOMY    . TRANSTHORACIC ECHOCARDIOGRAM  05/13/2018;12/03/18   05/2018 severe hypokinesis of LV inferior and inferolateral walls, EF 40-45%, correlates with stress test.  28/3151->VO 16-07%, diastolic fxn/LV filling pressure not determined due to pt in a fib/flutter    Current Outpatient Medications  Medication Sig Dispense Refill  . albuterol (VENTOLIN HFA) 108 (90 Base) MCG/ACT inhaler  Inhale 2 puffs into the lungs every 6 (six) hours as needed for wheezing or shortness of breath.     . ALPRAZolam (XANAX) 0.5 MG tablet TAKE 1 TABLET BY MOUTH EVERY 8 HOURS AS NEEDED FOR SLEEP OR ANXIETY 90 tablet 5  . amLODipine (NORVASC) 5 MG tablet Take 1 tablet (5 mg total) by mouth daily. 90 tablet 3  . aspirin EC 81 MG tablet Take 81 mg by mouth daily.    Marland Kitchen atorvastatin (LIPITOR) 40 MG tablet Take 40 mg by mouth daily.    . benazepril (LOTENSIN) 40 MG tablet Take 1 tablet (40 mg total) by mouth daily. 90 tablet 3  . carvedilol (COREG) 25 MG tablet Take 1 tablet (25 mg total) by mouth 2 (two) times daily. 60 tablet 6  . dofetilide (TIKOSYN) 500 MCG capsule Take 1 capsule (500 mcg total) by mouth 2 (two) times daily. 180 capsule 2  . furosemide (LASIX) 40 MG tablet Take 1 tablet by mouth once daily 90 tablet 0  . glipiZIDE (GLUCOTROL XL) 10 MG 24 hr tablet Take 1 tablet by mouth once daily with breakfast 30 tablet 0  . Insulin Pen Needle 31G X 5 MM MISC  Use with Victoza as directed 100 each 11  . liraglutide (VICTOZA) 18 MG/3ML SOPN INJECT 1.2MG  SUBCUTANEOUSLY ONCE DAILY 15 mL 6  . magnesium oxide (MAG-OX) 400 (241.3 Mg) MG tablet Take 1 tablet (400 mg total) by mouth 2 (two) times daily. 180 tablet 3  . metFORMIN (GLUCOPHAGE) 1000 MG tablet TAKE 1 TABLET BY MOUTH TWICE DAILY WITH A MEAL 180 tablet 3  . pregabalin (LYRICA) 150 MG capsule Take 1 capsule by mouth twice daily 60 capsule 5  . tamsulosin (FLOMAX) 0.4 MG CAPS capsule Take 0.4 mg by mouth daily in the afternoon.     Marland Kitchen testosterone cypionate (DEPOTESTOSTERONE CYPIONATE) 200 MG/ML injection 1 ml IM q 14 days 10 mL 0  . XARELTO 20 MG TABS tablet TAKE 1 TABLET BY MOUTH ONCE DAILY WITH  SUPPER 90 tablet 2  . XIAFLEX 0.9 MG SOLR     . zaleplon (SONATA) 5 MG capsule TAKE 1 CAPSULE BY MOUTH AT BEDTIME AS NEEDED FOR SLEEP 90 capsule 1  . Iron, Ferrous Sulfate, 325 (65 Fe) MG TABS TAKE 1 TABLET BY MOUTH ONCE DAILY WITH BREAKFAST     No  current facility-administered medications for this encounter.    No Known Allergies  Social History   Socioeconomic History  . Marital status: Divorced    Spouse name: Not on file  . Number of children: Not on file  . Years of education: Not on file  . Highest education level: Not on file  Occupational History  . Occupation: Paramedic-EMT    Employer: [employer]  Tobacco Use  . Smoking status: Never Smoker  . Smokeless tobacco: Former Systems developer    Types: Chew  Substance and Sexual Activity  . Alcohol use: Yes    Alcohol/week: 18.0 standard drinks    Types: 18 Cans of beer per week    Comment: 18  . Drug use: No  . Sexual activity: Not on file  Other Topics Concern  . Not on file  Social History Narrative   Married, no children.   Occupation: paramedic   Social Determinants of Radio broadcast assistant Strain:   . Difficulty of Paying Living Expenses: Not on file  Food Insecurity:   . Worried About Charity fundraiser in the Last Year: Not on file  . Ran Out of Food in the Last Year: Not on file  Transportation Needs:   . Lack of Transportation (Medical): Not on file  . Lack of Transportation (Non-Medical): Not on file  Physical Activity:   . Days of Exercise per Week: Not on file  . Minutes of Exercise per Session: Not on file  Stress:   . Feeling of Stress : Not on file  Social Connections:   . Frequency of Communication with Friends and Family: Not on file  . Frequency of Social Gatherings with Friends and Family: Not on file  . Attends Religious Services: Not on file  . Active Member of Clubs or Organizations: Not on file  . Attends Archivist Meetings: Not on file  . Marital Status: Not on file  Intimate Partner Violence:   . Fear of Current or Ex-Partner: Not on file  . Emotionally Abused: Not on file  . Physically Abused: Not on file  . Sexually Abused: Not on file    Family History  Problem Relation Age of Onset  . Heart disease  Father        valvular disease  . COPD Father   .  Prostate cancer Father   . Arrhythmia Father   . Colon cancer Neg Hx    The patient does not have a history of early familial atrial fibrillation or other arrhythmias.  ROS- All systems are reviewed and negative except as per the HPI above.  Physical Exam: Vitals:   08/26/19 1118  BP: 132/70  Pulse: 70  Weight: 133.3 kg  Height: 6\' 1"  (1.854 m)    GEN- The patient is well appearing obese male, alert and oriented x 3 today.   HEENT-head normocephalic, atraumatic, sclera clear, conjunctiva pink, hearing intact, trachea midline. Lungs- Clear to ausculation bilaterally, normal work of breathing Heart- Regular rate and rhythm, no murmurs, rubs or gallops  GI- soft, NT, ND, + BS Extremities- no clubbing, cyanosis, or edema MS- no significant deformity or atrophy Skin- no rash or lesion Psych- euthymic mood, full affect Neuro- strength and sensation are intact   Wt Readings from Last 3 Encounters:  08/26/19 133.3 kg  06/11/19 131.5 kg  06/06/19 133.7 kg    EKG today demonstrates SR HR 70, LAD, PR 168, QRS 114, QTc 466  Epic records are reviewed at length today  LHC 03/22/18  Elevated left ventricular end-diastolic pressure, 21 mmHg. EF 50%. Findings consistent with diastolic dysfunction.  Widely patent coronary arteries.  Normal left main  Normal LAD. Ostial first diagonal 60% narrowed.  Normal circumflex  Normal right coronary  False positive myocardial perfusion study related to diaphragm attenuation  Echo 12/03/18 1. Left ventricular ejection fraction, by visual estimation, is 40 to 45%. The left ventricle has mildly decreased function. There is mildly increased left ventricular hypertrophy.  2. Left ventricular diastolic function could not be evaluated.  3. Global right ventricle was not well visualized.The right ventricular size is not well visualized. Right vetricular wall thickness was not assessed.  4.  Left atrial size was normal.  5. Right atrial size was moderately dilated.  6. Mild to moderate aortic valve annular calcification.  7. The mitral valve is normal in structure. No evidence of mitral valve regurgitation. No evidence of mitral stenosis.  8. The tricuspid valve is grossly normal. Tricuspid valve regurgitation is not demonstrated.  9. The aortic valve is normal in structure. Aortic valve regurgitation is not visualized. No evidence of aortic valve sclerosis or stenosis. 10. The pulmonic valve was normal in structure. Pulmonic valve regurgitation is not visualized. 11. The atrial septum is grossly normal.   Assessment and Plan:  1. Paroxysmal atrial fibrillation/Typical atrial flutter S/p atrial flutter ablation with Dr Lovena Le 07/09/18 S/p Tikosyn load. Patient appears to be maintaining SR. Continue dofetilide 500 mcg BID. QT stable.  Unable to draw Bmet/mag today after multiple sticks. Encouraged patient to hydrate and will recheck. Continue Xarelto 20 mg daily.   Continue Coreg 25 mg BID Apple watch for monitoring  This patients CHA2DS2-VASc Score and unadjusted Ischemic Stroke Rate (% per year) is equal to 4.8 % stroke rate/year from a score of 4  Above score calculated as 1 point each if present [CHF, HTN, DM, Vascular=MI/PAD/Aortic Plaque, Age if 65-74, or Male] Above score calculated as 2 points each if present [Age > 75, or Stroke/TIA/TE]  2. Obesity Body mass index is 38.76 kg/m. Lifestyle modification was discussed and encouraged including regular physical activity and weight reduction.  3. Obstructive sleep apnea Patient reports compliance with CPAP therapy. Followed by Dr Claiborne Billings.  4. HTN Stable, no changes today.   Follow up in two weeks for repeat lab draw. Follow up  with Dr Stanford Breed as scheduled and AF clinic in 6 months.    [facility] [address] [place], [place] [address] [phone] 08/26/2019 1:12  PM

## 2019-09-01 ENCOUNTER — Other Ambulatory Visit: Payer: Self-pay | Admitting: Cardiology

## 2019-09-10 ENCOUNTER — Encounter (HOSPITAL_COMMUNITY): Payer: Self-pay

## 2019-09-10 ENCOUNTER — Other Ambulatory Visit (HOSPITAL_COMMUNITY): Payer: Commercial Managed Care - PPO | Admitting: Physician Assistant

## 2019-09-11 ENCOUNTER — Ambulatory Visit (HOSPITAL_COMMUNITY)
Admission: RE | Admit: 2019-09-11 | Discharge: 2019-09-11 | Disposition: A | Discharge status: Home / Self Care | Payer: Commercial Managed Care - PPO | Source: Ambulatory Visit | Attending: Physician Assistant | Admitting: Physician Assistant

## 2019-09-11 ENCOUNTER — Other Ambulatory Visit: Payer: Self-pay

## 2019-09-11 DIAGNOSIS — I480 Paroxysmal atrial fibrillation: Secondary | ICD-10-CM | POA: Diagnosis not present

## 2019-09-11 LAB — BASIC METABOLIC PANEL
Anion gap: 12 (ref 5–15)
BUN: 16 mg/dL (ref 6–20)
CO2: 25 mmol/L (ref 22–32)
Calcium: 9.4 mg/dL (ref 8.9–10.3)
Chloride: 101 mmol/L (ref 98–111)
Creatinine, Ser: 0.91 mg/dL (ref 0.61–1.24)
GFR calc Af Amer: >60 mL/min (ref >60)
GFR calc non Af Amer: >60 mL/min (ref >60)
Glucose, Bld: 162 mg/dL — ABNORMAL HIGH (ref 70–99)
Potassium: 4 mmol/L (ref 3.5–5.1)
Sodium: 138 mmol/L (ref 135–145)

## 2019-09-11 LAB — MAGNESIUM: Magnesium: 2 mg/dL (ref 1.7–2.4)

## 2019-10-08 ENCOUNTER — Telehealth: Payer: Self-pay | Admitting: *Deleted

## 2019-10-08 NOTE — Telephone Encounter (Signed)
Called the patient and informed him of the situation at hand in using his current Airsense 10 machine versus purchasing another one. Patient voiced understanding that his current machine cannot be linked in [place] due to it previously being linked by [facility], which has been bought out by [employer]. Per [employer] they do not have access to [facility]'s records therefore,they cannot release the machine. Since the patient got the machine in 2015 and we cannot link the machine in [facility] he has agreed to getting a new machine. Orders were resent to [facility] for patient to get a new CPAP machine.

## 2019-10-16 NOTE — Telephone Encounter (Signed)
Called [facility]-no orders have been received.     Need orders for CPAP machine and recent [facility] office note.     Orders and office note faxed to [facility], confirmed that this was received.      Maudie Mercury, clinical supervisor called patient to discuss and made aware orders faxed.

## 2019-11-03 NOTE — Progress Notes (Deleted)
HPI:  Follow-up atrial fibrillation/atrial flutter and cardiomyopathy.  Patient diagnosed with atrial flutter February 2020. LV function felt to be reduced on technically difficult echo 3/20. Cath 3/20 showed EF 50 60 D1 and otherwise normal coronaries. Had atrial flutter ablation 7/20.  Echocardiogram repeated December 2020 and showed ejection fraction 40 to 45%, mild left ventricular hypertrophy, moderate right atrial enlargement.  Monitor January 2021 showed sinus rhythm with occasional PAC, PVC, paroxysmal atrial flutter, paroxysmal atrial fibrillation and 7 beats nonsustained VT.  Has had previous atrial flutter ablation.  Patient now on Tikosyn for atrial fibrillation.  Since last seen,   Current Outpatient Medications  Medication Sig Dispense Refill  . albuterol (VENTOLIN HFA) 108 (90 Base) MCG/ACT inhaler Inhale 2 puffs into the lungs every 6 (six) hours as needed for wheezing or shortness of breath.     . ALPRAZolam (XANAX) 0.5 MG tablet TAKE 1 TABLET BY MOUTH EVERY 8 HOURS AS NEEDED FOR SLEEP OR ANXIETY 90 tablet 5  . amLODipine (NORVASC) 5 MG tablet Take 1 tablet (5 mg total) by mouth daily. 90 tablet 3  . aspirin EC 81 MG tablet Take 81 mg by mouth daily.    Marland Kitchen atorvastatin (LIPITOR) 40 MG tablet Take 1 tablet by mouth once daily 90 tablet 0  . benazepril (LOTENSIN) 40 MG tablet Take 1 tablet (40 mg total) by mouth daily. 90 tablet 3  . carvedilol (COREG) 25 MG tablet Take 1 tablet (25 mg total) by mouth 2 (two) times daily. 60 tablet 6  . dofetilide (TIKOSYN) 500 MCG capsule Take 1 capsule (500 mcg total) by mouth 2 (two) times daily. 180 capsule 2  . furosemide (LASIX) 40 MG tablet Take 1 tablet by mouth once daily 90 tablet 0  . glipiZIDE (GLUCOTROL XL) 10 MG 24 hr tablet Take 1 tablet by mouth once daily with breakfast 30 tablet 0  . Insulin Pen Needle 31G X 5 MM MISC Use with Victoza as directed 100 each 11  . Iron, Ferrous Sulfate, 325 (65 Fe) MG TABS TAKE 1 TABLET BY MOUTH  ONCE DAILY WITH BREAKFAST    . liraglutide (VICTOZA) 18 MG/3ML SOPN INJECT 1.2MG  SUBCUTANEOUSLY ONCE DAILY 15 mL 6  . magnesium oxide (MAG-OX) 400 (241.3 Mg) MG tablet Take 1 tablet (400 mg total) by mouth 2 (two) times daily. 180 tablet 3  . metFORMIN (GLUCOPHAGE) 1000 MG tablet TAKE 1 TABLET BY MOUTH TWICE DAILY WITH A MEAL 180 tablet 3  . pregabalin (LYRICA) 150 MG capsule Take 1 capsule by mouth twice daily 60 capsule 5  . tamsulosin (FLOMAX) 0.4 MG CAPS capsule Take 0.4 mg by mouth daily in the afternoon.     Marland Kitchen testosterone cypionate (DEPOTESTOSTERONE CYPIONATE) 200 MG/ML injection 1 ml IM q 14 days 10 mL 0  . XARELTO 20 MG TABS tablet TAKE 1 TABLET BY MOUTH ONCE DAILY WITH  SUPPER 90 tablet 2  . XIAFLEX 0.9 MG SOLR     . zaleplon (SONATA) 5 MG capsule TAKE 1 CAPSULE BY MOUTH AT BEDTIME AS NEEDED FOR SLEEP 90 capsule 1   No current facility-administered medications for this visit.     Past Medical History:  Diagnosis Date  . Anxiety   . Arthritis    lower back   . Atrial flutter ([place]) 2020   Cards->lopressor and anticoag, ablation. In sinus still as of 08/2018 cards f/u.  Recurrence + PAF->requiring pt to get back on xarelto  . BPH with obstruction/lower urinary tract  symptoms    started flomax via urol 03/2019  . CAD (coronary artery disease) 03/2018   Ostial first diagonal 60% narrowed, otherwise normal coronaries. Dr. Stanford Breed added ASA and statin 08/2018.  Marland Kitchen Depression   . Diabetes mellitus   . False positive stress test 03/2018   Cath showed normal coronaries with EF 50%  . GERD (gastroesophageal reflux disease)   . Hyperlipidemia   . Hypertension   . Hypogonadism, male 05/2015  . Increased prostate specific antigen (PSA) velocity 2019   06/2017 velocity up; recheck 10/2017 back to baseline.  Plan repeat 1 yr.  . Low back pain 2016   Left lumbar radiculopathy, lumbar spondylolisthesis.  Sciatica episode 03/2016.  Ortho getting L spine MRI as of 07/2016.  . Microcytic anemia  12/2015   suspect iron def due to malabsorption.  Hemoccults NEG 01/26/16, ferrous sulfate started.  . Neuropathic pain of left foot   . NICM (nonischemic cardiomyopathy) ([place]) 05/2018   EF 40-45%, +LV hypokinesis. Suspected to be tachycardia-mediated->Dr. Stanford Breed to rpt echo fall 2020.  Marland Kitchen OSA on CPAP    cpap settings at 3  . Peyronie's disease 2021   Dr. Porfirio Oar (collegenase intralesional injections)    Past Surgical History:  Procedure Laterality Date  . A-FLUTTER ABLATION N/A 07/09/2018   Procedure: A-FLUTTER ABLATION;  Surgeon: Evans Lance, MD;  Location: [place] [facility];  Service: Cardiovascular;  Laterality: N/A;  . ADENOIDECTOMY    . BREATH TEK H PYLORI N/A 01/08/2013   Procedure: [place];  Surgeon: [name], MD;  Location: [facility];  Service: General;  Laterality: N/A;  . CARDIAC CATHETERIZATION  03/2018   No signi obstructive CAD (suspected FALSE POSITIVE STRESS TEST).  EF 50%.  Diastolic dysfunction.  Marland Kitchen CARDIOVASCULAR STRESS TEST  03/2018   Intermediate risk; EF 40%, medium sized defect with peri-infarct ischemia-->follow up cath showed no signif obstructive CAD.  Marland Kitchen COLONOSCOPY  04/17/2014   Benign polyp x 1.  Recall 10 yrs.  Marland Kitchen GASTRIC ROUX-EN-Y N/A 04/14/2013   Procedure: LAPAROSCOPIC ROUX-EN-Y GASTRIC BYPASS WITH UPPER ENDOSCOPY;  Surgeon: [name], MD;  Location: [facility];  Service: General;  Laterality: N/A;  . LEFT HEART CATH AND CORONARY ANGIOGRAPHY N/A 03/22/2018   NO CAD.  EF 50%. Procedure: LEFT HEART CATH AND CORONARY ANGIOGRAPHY;  Surgeon: Belva Crome, MD;  Location: [place] [facility];  Service: Cardiovascular;  Laterality: N/A;  . Rhythm monitoring  01/08/2019   7 day monitor->Sinus rhythm with occasional PAC, PVC, paroxysmal atrial flutter, paroxysmal atrial fibrillation and 7 beats nonsustained ventricular tachycardia: xarelto restarted  . TONSILLECTOMY    . TRANSTHORACIC ECHOCARDIOGRAM  05/13/2018;12/03/18   05/2018  severe hypokinesis of LV inferior and inferolateral walls, EF 40-45%, correlates with stress test.  90/3009->QZ 30-07%, diastolic fxn/LV filling pressure not determined due to pt in a fib/flutter    Social History   Socioeconomic History  . Marital status: Divorced    Spouse name: Not on file  . Number of children: Not on file  . Years of education: Not on file  . Highest education level: Not on file  Occupational History  . Occupation: Paramedic-EMT    Employer: [employer]  Tobacco Use  . Smoking status: Never Smoker  . Smokeless tobacco: Former Systems developer    Types: Chew  Substance and Sexual Activity  . Alcohol use: Yes    Alcohol/week: 18.0 standard drinks    Types: 18 Cans of beer per week  Comment: 18  . Drug use: No  . Sexual activity: Not on file  Other Topics Concern  . Not on file  Social History Narrative   Married, no children.   Occupation: paramedic   Social Determinants of Radio broadcast assistant Strain:   . Difficulty of Paying Living Expenses: Not on file  Food Insecurity:   . Worried About Charity fundraiser in the Last Year: Not on file  . Ran Out of Food in the Last Year: Not on file  Transportation Needs:   . Lack of Transportation (Medical): Not on file  . Lack of Transportation (Non-Medical): Not on file  Physical Activity:   . Days of Exercise per Week: Not on file  . Minutes of Exercise per Session: Not on file  Stress:   . Feeling of Stress : Not on file  Social Connections:   . Frequency of Communication with Friends and Family: Not on file  . Frequency of Social Gatherings with Friends and Family: Not on file  . Attends Religious Services: Not on file  . Active Member of Clubs or Organizations: Not on file  . Attends Archivist Meetings: Not on file  . Marital Status: Not on file  Intimate Partner Violence:   . Fear of Current or Ex-Partner: Not on file  . Emotionally Abused: Not on file  . Physically Abused: Not  on file  . Sexually Abused: Not on file    Family History  Problem Relation Age of Onset  . Heart disease Father        valvular disease  . COPD Father   . Prostate cancer Father   . Arrhythmia Father   . Colon cancer Neg Hx     ROS: no fevers or chills, productive cough, hemoptysis, dysphasia, odynophagia, melena, hematochezia, dysuria, hematuria, rash, seizure activity, orthopnea, PND, pedal edema, claudication. Remaining systems are negative.  Physical Exam: Well-developed well-nourished in no acute distress.  Skin is warm and dry.  HEENT is normal.  Neck is supple.  Chest is clear to auscultation with normal expansion.  Cardiovascular exam is regular rate and rhythm.  Abdominal exam nontender or distended. No masses palpated. Extremities show no edema. neuro grossly intact  ECG- personally reviewed  A/P  1 paroxysmal atrial fibrillation/flutter-has had previous atrial flutter ablation.  Patient remains in sinus rhythm.  Continue Tikosyn at present dose.  Continue Xarelto.  Check hemoglobin, renal function and magnesium.  2 history of nonischemic cardiomyopathy-possibly tachycardia mediated.  Continue ACE inhibitor and beta-blocker.  3 coronary artery disease-continue statin.  No aspirin given need for Xarelto.  4 hypertension-patient's blood pressure is controlled.  Continue present medications.  5 morbid obesity-we discussed the importance of diet, exercise and weight loss.  6 obstructive sleep apnea-managed by Dr. Claiborne Billings.  Kirk Ruths, MD

## 2019-11-10 ENCOUNTER — Ambulatory Visit: Payer: Commercial Managed Care - PPO | Admitting: Cardiology

## 2020-01-22 ENCOUNTER — Telehealth: Payer: Self-pay | Admitting: *Deleted

## 2020-01-22 NOTE — Telephone Encounter (Signed)
   [facility] Pre-operative Risk Assessment      Request for surgical clearance:  1. What type of surgery is being performed?  Endoscopy and  Colonoscopy   2. When is this surgery scheduled?  1/25 /22  3. What type of clearance is required (medical clearance vs. Pharmacy clearance to hold med vs. Both)?  both  4. Are there any medications that need to be held prior to surgery and how long? Xarelto 2 days prior  5. Practice name and name of physician performing surgery? [facility]  6. What is the office phone number?  [phone]   7.   What is the office fax number? [phone]  8.   Anesthesia type (None, local, MAC, general) ?  unknown   Ethan Pitts 01/22/2020, 4:26 PM  _________________________________________________________________   (provider comments below)

## 2020-01-23 ENCOUNTER — Encounter: Payer: Self-pay | Admitting: Cardiology

## 2020-01-23 NOTE — Telephone Encounter (Signed)
Patient with diagnosis of afib on Xarelto for anticoagulation.    Procedure: endoscopy and colonoscopy Date of procedure: 01/27/20   CHA2DS2-VASc Score = 4  This indicates a 4.8% annual risk of stroke. The patient's score is based upon: CHF History: Yes HTN History: Yes Diabetes History: Yes Stroke History: No Vascular Disease History: Yes Age Score: 0 Gender Score: 0  CrCl >161mL/min using adjusted body weight Platelet count 181K  Per office protocol, patient can hold Xarelto for 2 days prior to procedure.

## 2020-01-23 NOTE — Telephone Encounter (Signed)
Jasmine from [facility] calling requesting clearance to be faxed back to them.

## 2020-01-23 NOTE — Telephone Encounter (Signed)
error 

## 2020-01-23 NOTE — Telephone Encounter (Signed)
° °  Primary Cardiologist: [name], MD  Chart reviewed as part of pre-operative protocol coverage. Given past medical history and time since last visit, based on ACC/AHA guidelines, Ethan Pitts would be at acceptable risk for the planned procedure without further cardiovascular testing.   Patient with diagnosis of afib on Xarelto for anticoagulation.    Procedure: endoscopy and colonoscopy Date of procedure: 01/27/20   CHA2DS2-VASc Score = 4  This indicates a 4.8% annual risk of stroke. The patient's score is based upon: CHF History: Yes HTN History: Yes Diabetes History: Yes Stroke History: No Vascular Disease History: Yes Age Score: 0 Gender Score: 0  CrCl >122mL/min using adjusted body weight Platelet count 181K  Per office protocol, patient can hold Xarelto for 2 days prior to procedure.   I will route this recommendation to the requesting party via Epic fax function and remove from pre-op pool.  Please call with questions.  [name] NP-C    01/23/2020, 11:30 AM [place] [address] Office [phone] Fax [phone]

## 2020-01-27 NOTE — Progress Notes (Deleted)
HPI: Follow-up atrial flutter and cardiomyopathy.  Patient diagnosed with atrial flutter February 2020. LV function felt to be reduced on technically difficult echo 3/20. Cath 3/20 showed EF 50 60 D1 and otherwise normal coronaries. Had atrial flutter ablation 7/20.  Echocardiogram December 2020 showed ejection fraction 40 to 45%, moderate right atrial enlargement.  Monitor in January 2021 showed sinus with occasional PAC, PVC, atrial flutter and 7 beats nonsustained ventricular tachycardia.  Since last seen,   Current Outpatient Medications  Medication Sig Dispense Refill   albuterol (VENTOLIN HFA) 108 (90 Base) MCG/ACT inhaler Inhale 2 puffs into the lungs every 6 (six) hours as needed for wheezing or shortness of breath.      ALPRAZolam (XANAX) 0.5 MG tablet TAKE 1 TABLET BY MOUTH EVERY 8 HOURS AS NEEDED FOR SLEEP OR ANXIETY 90 tablet 5   amLODipine (NORVASC) 5 MG tablet Take 1 tablet (5 mg total) by mouth daily. 90 tablet 3   aspirin EC 81 MG tablet Take 81 mg by mouth daily.     atorvastatin (LIPITOR) 40 MG tablet Take 1 tablet by mouth once daily 90 tablet 0   benazepril (LOTENSIN) 40 MG tablet Take 1 tablet (40 mg total) by mouth daily. 90 tablet 3   carvedilol (COREG) 25 MG tablet Take 1 tablet (25 mg total) by mouth 2 (two) times daily. 60 tablet 6   dofetilide (TIKOSYN) 500 MCG capsule Take 1 capsule (500 mcg total) by mouth 2 (two) times daily. 180 capsule 2   furosemide (LASIX) 40 MG tablet Take 1 tablet by mouth once daily 90 tablet 0   glipiZIDE (GLUCOTROL XL) 10 MG 24 hr tablet Take 1 tablet by mouth once daily with breakfast 30 tablet 0   Insulin Pen Needle 31G X 5 MM MISC Use with Victoza as directed 100 each 11   Iron, Ferrous Sulfate, 325 (65 Fe) MG TABS TAKE 1 TABLET BY MOUTH ONCE DAILY WITH BREAKFAST     liraglutide (VICTOZA) 18 MG/3ML SOPN INJECT 1.2MG  SUBCUTANEOUSLY ONCE DAILY 15 mL 6   magnesium oxide (MAG-OX) 400 (241.3 Mg) MG tablet Take 1 tablet  (400 mg total) by mouth 2 (two) times daily. 180 tablet 3   metFORMIN (GLUCOPHAGE) 1000 MG tablet TAKE 1 TABLET BY MOUTH TWICE DAILY WITH A MEAL 180 tablet 3   pregabalin (LYRICA) 150 MG capsule Take 1 capsule by mouth twice daily 60 capsule 5   tamsulosin (FLOMAX) 0.4 MG CAPS capsule Take 0.4 mg by mouth daily in the afternoon.      testosterone cypionate (DEPOTESTOSTERONE CYPIONATE) 200 MG/ML injection 1 ml IM q 14 days 10 mL 0   XARELTO 20 MG TABS tablet TAKE 1 TABLET BY MOUTH ONCE DAILY WITH  SUPPER 90 tablet 2   XIAFLEX 0.9 MG SOLR      zaleplon (SONATA) 5 MG capsule TAKE 1 CAPSULE BY MOUTH AT BEDTIME AS NEEDED FOR SLEEP 90 capsule 1   No current facility-administered medications for this visit.     Past Medical History:  Diagnosis Date   Anxiety    Arthritis    lower back    Atrial flutter ([place]) 2020   Cards->lopressor and anticoag, ablation. In sinus still as of 08/2018 cards f/u.  Recurrence + PAF->requiring pt to get back on xarelto   BPH with obstruction/lower urinary tract symptoms    started flomax via urol 03/2019   CAD (coronary artery disease) 03/2018   Ostial first diagonal 60% narrowed, otherwise normal coronaries.  Dr. Stanford Breed added ASA and statin 08/2018.   Depression    Diabetes mellitus    False positive stress test 03/2018   Cath showed normal coronaries with EF 50%   GERD (gastroesophageal reflux disease)    Hyperlipidemia    Hypertension    Hypogonadism, male 05/2015   Increased prostate specific antigen (PSA) velocity 2019   06/2017 velocity up; recheck 10/2017 back to baseline.  Plan repeat 1 yr.   Low back pain 2016   Left lumbar radiculopathy, lumbar spondylolisthesis.  Sciatica episode 03/2016.  Ortho getting L spine MRI as of 07/2016.   Microcytic anemia 12/2015   suspect iron def due to malabsorption.  Hemoccults NEG 01/26/16, ferrous sulfate started.   Neuropathic pain of left foot    NICM (nonischemic cardiomyopathy) ([place])  05/2018   EF 40-45%, +LV hypokinesis. Suspected to be tachycardia-mediated->Dr. Stanford Breed to rpt echo fall 2020.   OSA on CPAP    cpap settings at 3   Peyronie's disease 2021   Dr. Porfirio Oar (collegenase intralesional injections)    Past Surgical History:  Procedure Laterality Date   A-FLUTTER ABLATION N/A 07/09/2018   Procedure: A-FLUTTER ABLATION;  Surgeon: Evans Lance, MD;  Location: [place] [facility];  Service: Cardiovascular;  Laterality: N/A;   ADENOIDECTOMY     BREATH TEK H PYLORI N/A 01/08/2013   Procedure: [place];  Surgeon: [name], MD;  Location: [facility];  Service: General;  Laterality: N/A;   CARDIAC CATHETERIZATION  03/2018   No signi obstructive CAD (suspected FALSE POSITIVE STRESS TEST).  EF 50%.  Diastolic dysfunction.   CARDIOVASCULAR STRESS TEST  03/2018   Intermediate risk; EF 40%, medium sized defect with peri-infarct ischemia-->follow up cath showed no signif obstructive CAD.   COLONOSCOPY  04/17/2014   Benign polyp x 1.  Recall 10 yrs.   GASTRIC ROUX-EN-Y N/A 04/14/2013   Procedure: LAPAROSCOPIC ROUX-EN-Y GASTRIC BYPASS WITH UPPER ENDOSCOPY;  Surgeon: [name], MD;  Location: [facility];  Service: General;  Laterality: N/A;   LEFT HEART CATH AND CORONARY ANGIOGRAPHY N/A 03/22/2018   NO CAD.  EF 50%. Procedure: LEFT HEART CATH AND CORONARY ANGIOGRAPHY;  Surgeon: Belva Crome, MD;  Location: [facility];  Service: Cardiovascular;  Laterality: N/A;   Rhythm monitoring  01/08/2019   7 day monitor->Sinus rhythm with occasional PAC, PVC, paroxysmal atrial flutter, paroxysmal atrial fibrillation and 7 beats nonsustained ventricular tachycardia: xarelto restarted   TONSILLECTOMY     TRANSTHORACIC ECHOCARDIOGRAM  05/13/2018;12/03/18   05/2018 severe hypokinesis of LV inferior and inferolateral walls, EF 40-45%, correlates with stress test.  0000000 [id number], diastolic fxn/LV filling pressure not determined due to pt in a  fib/flutter    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EMT    Employer: [employer]  Tobacco Use   Smoking status: Never Smoker   Smokeless tobacco: Former Systems developer    Types: Chew  Substance and Sexual Activity   Alcohol use: Yes    Alcohol/week: 18.0 standard drinks    Types: 18 Cans of beer per week    Comment: 18   Drug use: No   Sexual activity: Not on file  Other Topics Concern   Not on file  Social  History Narrative   Married, no children.   Occupation: paramedic   Social Determinants of Radio broadcast assistant Strain: Not on file  Food Insecurity: Not on file  Transportation Needs: Not on file  Physical Activity: Not on file  Stress: Not on file  Social Connections: Not on file  Intimate Partner Violence: Not on file    Family History  Problem Relation Age of Onset   Heart disease Father        valvular disease   COPD Father    Prostate cancer Father    Arrhythmia Father    Colon cancer Neg Hx     ROS: no fevers or chills, productive cough, hemoptysis, dysphasia, odynophagia, melena, hematochezia, dysuria, hematuria, rash, seizure activity, orthopnea, PND, pedal edema, claudication. Remaining systems are negative.  Physical Exam: Well-developed well-nourished in no acute distress.  Skin is warm and dry.  HEENT is normal.  Neck is supple.  Chest is clear to auscultation with normal expansion.  Cardiovascular exam is regular rate and rhythm.  Abdominal exam nontender or distended. No masses palpated. Extremities show no edema. neuro grossly intact  ECG- personally reviewed  A/P  1 paroxysmal atrial fibrillation/flutter-patient has had previous ablation but noted to have recurrent atrial arrhythmias on monitor.  Now  status post Tikosyn load.  He is in sinus rhythm today.  We will continue Xarelto.  Continue beta-blocker.    2 nonischemic cardiomyopathy-etiology unclear.  Previously felt possibly tachycardia mediated.  Continue Lotensin and metoprolol.  Repeat echocardiogram.  3 hypertension-patient's blood pressure is controlled.  Continue present medications and follow.  4 coronary artery disease-continue with aspirin and statin.  5 hyperlipidemia-continue Lipitor.   6 morbid obesity-we discussed the importance of weight loss.  7 obstructive sleep apnea-managed by Dr. Claiborne Billings.  Kirk Ruths, MD

## 2020-01-31 ENCOUNTER — Emergency Department (HOSPITAL_BASED_OUTPATIENT_CLINIC_OR_DEPARTMENT_OTHER)
Admission: EM | Admit: 2020-01-31 | Discharge: 2020-01-31 | Disposition: A | Discharge status: Home / Self Care | Payer: Commercial Managed Care - PPO | Attending: Emergency Medicine | Admitting: Emergency Medicine

## 2020-01-31 ENCOUNTER — Other Ambulatory Visit: Payer: Self-pay

## 2020-01-31 ENCOUNTER — Encounter (HOSPITAL_BASED_OUTPATIENT_CLINIC_OR_DEPARTMENT_OTHER): Payer: Self-pay | Admitting: Emergency Medicine

## 2020-01-31 ENCOUNTER — Emergency Department (HOSPITAL_BASED_OUTPATIENT_CLINIC_OR_DEPARTMENT_OTHER): Payer: Commercial Managed Care - PPO

## 2020-01-31 DIAGNOSIS — Z7984 Long term (current) use of oral hypoglycemic drugs: Secondary | ICD-10-CM | POA: Insufficient documentation

## 2020-01-31 DIAGNOSIS — Z794 Long term (current) use of insulin: Secondary | ICD-10-CM | POA: Diagnosis not present

## 2020-01-31 DIAGNOSIS — M62830 Muscle spasm of back: Secondary | ICD-10-CM | POA: Insufficient documentation

## 2020-01-31 DIAGNOSIS — Z79899 Other long term (current) drug therapy: Secondary | ICD-10-CM | POA: Diagnosis not present

## 2020-01-31 DIAGNOSIS — I10 Essential (primary) hypertension: Secondary | ICD-10-CM | POA: Diagnosis not present

## 2020-01-31 DIAGNOSIS — Z7901 Long term (current) use of anticoagulants: Secondary | ICD-10-CM | POA: Diagnosis not present

## 2020-01-31 DIAGNOSIS — E119 Type 2 diabetes mellitus without complications: Secondary | ICD-10-CM | POA: Diagnosis not present

## 2020-01-31 DIAGNOSIS — M5416 Radiculopathy, lumbar region: Secondary | ICD-10-CM | POA: Insufficient documentation

## 2020-01-31 DIAGNOSIS — I2510 Atherosclerotic heart disease of native coronary artery without angina pectoris: Secondary | ICD-10-CM | POA: Diagnosis not present

## 2020-01-31 DIAGNOSIS — M545 Low back pain, unspecified: Secondary | ICD-10-CM | POA: Diagnosis present

## 2020-01-31 LAB — CBC WITH DIFFERENTIAL/PLATELET
Abs Immature Granulocytes: 0.02 10*3/uL (ref 0.00–0.07)
Basophils Absolute: 0 10*3/uL (ref 0.0–0.1)
Basophils Relative: 1 %
Eosinophils Absolute: 0.1 10*3/uL (ref 0.0–0.5)
Eosinophils Relative: 2 %
HCT: 39.7 % (ref 39.0–52.0)
Hemoglobin: 14.2 g/dL (ref 13.0–17.0)
Immature Granulocytes: 0 %
Lymphocytes Relative: 23 %
Lymphs Abs: 1.2 10*3/uL (ref 0.7–4.0)
MCH: 32.9 pg (ref 26.0–34.0)
MCHC: 35.8 g/dL (ref 30.0–36.0)
MCV: 91.9 fL (ref 80.0–100.0)
Monocytes Absolute: 0.5 10*3/uL (ref 0.1–1.0)
Monocytes Relative: 9 %
Neutro Abs: 3.3 10*3/uL (ref 1.7–7.7)
Neutrophils Relative %: 65 %
Platelets: 160 10*3/uL (ref 150–400)
RBC: 4.32 MIL/uL (ref 4.22–5.81)
RDW: 12.7 % (ref 11.5–15.5)
WBC: 5.1 10*3/uL (ref 4.0–10.5)
nRBC: 0 % (ref 0.0–0.2)

## 2020-01-31 LAB — URINALYSIS, ROUTINE W REFLEX MICROSCOPIC
Bilirubin Urine: NEGATIVE
Glucose, UA: NEGATIVE mg/dL
Hgb urine dipstick: NEGATIVE
Ketones, ur: NEGATIVE mg/dL
Leukocytes,Ua: NEGATIVE
Nitrite: NEGATIVE
Protein, ur: NEGATIVE mg/dL
Specific Gravity, Urine: 1.02 (ref 1.005–1.030)
pH: 5 (ref 5.0–8.0)

## 2020-01-31 LAB — COMPREHENSIVE METABOLIC PANEL
ALT: 25 U/L (ref 0–44)
AST: 29 U/L (ref 15–41)
Albumin: 4.2 g/dL (ref 3.5–5.0)
Alkaline Phosphatase: 48 U/L (ref 38–126)
Anion gap: 12 (ref 5–15)
BUN: 15 mg/dL (ref 6–20)
CO2: 23 mmol/L (ref 22–32)
Calcium: 9.5 mg/dL (ref 8.9–10.3)
Chloride: 99 mmol/L (ref 98–111)
Creatinine, Ser: 0.89 mg/dL (ref 0.61–1.24)
GFR, Estimated: >60 mL/min (ref >60)
Glucose, Bld: 117 mg/dL — ABNORMAL HIGH (ref 70–99)
Potassium: 3.9 mmol/L (ref 3.5–5.1)
Sodium: 134 mmol/L — ABNORMAL LOW (ref 135–145)
Total Bilirubin: 0.5 mg/dL (ref 0.3–1.2)
Total Protein: 7.4 g/dL (ref 6.5–8.1)

## 2020-01-31 MED ORDER — DIAZEPAM 5 MG PO TABS
5.0000 mg | ORAL_TABLET | Freq: Once | ORAL | Status: AC
  Administered 2020-01-31: 5 mg via ORAL
  Filled 2020-01-31: qty 1

## 2020-01-31 MED ORDER — OXYCODONE-ACETAMINOPHEN 5-325 MG PO TABS
1.0000 | ORAL_TABLET | Freq: Once | ORAL | Status: AC
  Administered 2020-01-31: 1 via ORAL
  Filled 2020-01-31: qty 1

## 2020-01-31 MED ORDER — IBUPROFEN 600 MG PO TABS: 600.0000 mg | ORAL_TABLET | Freq: Two times a day (BID) | ORAL | 0 refills | Status: DC | PRN

## 2020-01-31 MED ORDER — LIDOCAINE 4 % EX PTCH: 1.0000 | MEDICATED_PATCH | Freq: Two times a day (BID) | CUTANEOUS | 0 refills | Status: DC

## 2020-01-31 MED ORDER — NAPROXEN 250 MG PO TABS: 500.0000 mg | ORAL_TABLET | Freq: Once | ORAL | Status: DC

## 2020-01-31 MED ORDER — HYDROCODONE-ACETAMINOPHEN 5-325 MG PO TABS: 2.0000 | ORAL_TABLET | ORAL | 0 refills | Status: DC | PRN

## 2020-01-31 MED ORDER — METHOCARBAMOL 500 MG PO TABS: 500.0000 mg | ORAL_TABLET | Freq: Two times a day (BID) | ORAL | 0 refills | Status: DC

## 2020-01-31 MED ORDER — ACETAMINOPHEN ER 650 MG PO TBCR: 650.0000 mg | EXTENDED_RELEASE_TABLET | Freq: Three times a day (TID) | ORAL | 0 refills | Status: DC

## 2020-01-31 NOTE — Discharge Instructions (Addendum)
We suspect that your symptoms are because of muscle cramps and spasm.  There is a possibility of pinched nerve.  The urine test, basic blood work and ultrasound did not reveal any evidence of infection, kidney stones.  We recommend that you take the medications prescribed.  Do not take ibuprofen more than twice a day given that you are on a blood thinner.  Take Vicodin only if the pain is severe/for breakthrough pain only.  No driving or operating heavy machinery's while taking pain narcotic medicine or even the muscle relaxant.

## 2020-01-31 NOTE — ED Triage Notes (Signed)
Pt arrives pov with c/o acute right side back pain with urinary frequency that started this morning. Pt endorses 600mg  ibuprofen at 0700, 400 mg at 0500 today. Pt denies injury

## 2020-01-31 NOTE — ED Provider Notes (Addendum)
[facility] DEPART[facility] Provider Note   CSN: [id number] Arrival date & time: 01/31/20  1050     History Chief Complaint  Patient presents with  . Back Pain    Ethan Pitts is a 60 y.o. male.  HPI     60 year old male comes in with chief complaint of back pain. Patient has history of a flutter, CAD, diabetes, metabolic syndrome and recently had upper and lower endoscopy.  He comes in because he started having back pain last night.  Patient states that when he woke up to go urinate, he noticed excruciating back pain.  The pain has been present ever since then with waxing and waning intensity.  Typically the pain is worse with any kind of movement.  He denies any UTI-like symptoms, history of kidney stone, kidney infection, nausea, vomiting, fevers, chills.  No recent trauma.  Denies any anterior abdominal pain.  Review of system is negative for any numbness, tingling, weakness in lower extremity.  Patient has no known history of back problems  Past Medical History:  Diagnosis Date  . Anxiety   . Arthritis    lower back   . Atrial flutter ([place]) 2020   Cards->lopressor and anticoag, ablation. In sinus still as of 08/2018 cards f/u.  Recurrence + PAF->requiring pt to get back on xarelto  . BPH with obstruction/lower urinary tract symptoms    started flomax via urol 03/2019  . CAD (coronary artery disease) 03/2018   Ostial first diagonal 60% narrowed, otherwise normal coronaries. Dr. Stanford Breed added ASA and statin 08/2018.  Marland Kitchen Depression   . Diabetes mellitus   . False positive stress test 03/2018   Cath showed normal coronaries with EF 50%  . GERD (gastroesophageal reflux disease)   . Hyperlipidemia   . Hypertension   . Hypogonadism, male 05/2015  . Increased prostate specific antigen (PSA) velocity 2019   06/2017 velocity up; recheck 10/2017 back to baseline.  Plan repeat 1 yr.  . Low back pain 2016   Left lumbar radiculopathy, lumbar spondylolisthesis.   Sciatica episode 03/2016.  Ortho getting L spine MRI as of 07/2016.  . Microcytic anemia 12/2015   suspect iron def due to malabsorption.  Hemoccults NEG 01/26/16, ferrous sulfate started.  . Neuropathic pain of left foot   . NICM (nonischemic cardiomyopathy) ([place]) 05/2018   EF 40-45%, +LV hypokinesis. Suspected to be tachycardia-mediated->Dr. Stanford Breed to rpt echo fall 2020.  Marland Kitchen OSA on CPAP    cpap settings at 3  . Peyronie's disease 2021   Dr. Porfirio Oar (collegenase intralesional injections)    Patient Active Problem List   Diagnosis Date Noted  . Chronic anticoagulation 04/17/2019  . Paroxysmal atrial fibrillation ([place]) 01/14/2019  . Secondary hypercoagulable state ([place]) 01/14/2019  . NICM (nonischemic cardiomyopathy) ([place]) 12/04/2018  . CAD (coronary artery disease) 12/04/2018  . Abnormal nuclear stress test 03/21/2018  . Depression   . Acute bronchitis with symptoms > 10 days 02/12/2018  . Atrial flutter ([place]) 02/12/2018  . Microcytic anemia 12/03/2015  . Hypogonadism, male 05/03/2015  . Diabetes mellitus without complication ([place]) 40/98/1191  . Insomnia 03/05/2014  . Encounter for vitamin deficiency screening 02/26/2014  . Preventative health care 02/26/2014  . BMI 37.0-37.9, adult 02/26/2014  . Lumbar pain with radiation down left leg 02/26/2014  . 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 Morbid obesity ([place]) 05/03/2009  . DEGENERATIVE JOINT DISEASE 05/03/2009  . DM type 2 (diabetes mellitus, type 2) ([place]) 02/17/2009  . Hyperlipidemia 02/17/2009  .  Sleep apnea in adult 08/23/2007  . Essential hypertension 05/22/2006    Past Surgical History:  Procedure Laterality Date  . A-FLUTTER ABLATION N/A 07/09/2018   Procedure: A-FLUTTER ABLATION;  Surgeon: Evans Lance, MD;  Location: [facility];  Service: Cardiovascular;  Laterality: N/A;  . ADENOIDECTOMY    . BREATH TEK H PYLORI N/A 01/08/2013   Procedure: [place];  Surgeon: [name], MD;   Location: [facility];  Service: General;  Laterality: N/A;  . CARDIAC CATHETERIZATION  03/2018   No signi obstructive CAD (suspected FALSE POSITIVE STRESS TEST).  EF 50%.  Diastolic dysfunction.  Marland Kitchen CARDIOVASCULAR STRESS TEST  03/2018   Intermediate risk; EF 40%, medium sized defect with peri-infarct ischemia-->follow up cath showed no signif obstructive CAD.  Marland Kitchen COLONOSCOPY  04/17/2014   Benign polyp x 1.  Recall 10 yrs.  Marland Kitchen GASTRIC ROUX-EN-Y N/A 04/14/2013   Procedure: LAPAROSCOPIC ROUX-EN-Y GASTRIC BYPASS WITH UPPER ENDOSCOPY;  Surgeon: [name], MD;  Location: [facility];  Service: General;  Laterality: N/A;  . LEFT HEART CATH AND CORONARY ANGIOGRAPHY N/A 03/22/2018   NO CAD.  EF 50%. Procedure: LEFT HEART CATH AND CORONARY ANGIOGRAPHY;  Surgeon: Belva Crome, MD;  Location: [facility];  Service: Cardiovascular;  Laterality: N/A;  . Rhythm monitoring  01/08/2019   7 day monitor->Sinus rhythm with occasional PAC, PVC, paroxysmal atrial flutter, paroxysmal atrial fibrillation and 7 beats nonsustained ventricular tachycardia: xarelto restarted  . TONSILLECTOMY    . TRANSTHORACIC ECHOCARDIOGRAM  05/13/2018;12/03/18   05/2018 severe hypokinesis of LV inferior and inferolateral walls, EF 40-45%, correlates with stress test.  97/3532->DJ 24-26%, diastolic fxn/LV filling pressure not determined due to pt in a fib/flutter       Family History  Problem Relation Age of Onset  . Heart disease Father        valvular disease  . COPD Father   . Prostate cancer Father   . Arrhythmia Father   . Colon cancer Neg Hx     Social History   Tobacco Use  . Smoking status: Never Smoker  . Smokeless tobacco: Former Systems developer    Types: Chew  Substance Use Topics  . Alcohol use: Yes    Alcohol/week: 18.0 standard drinks    Types: 18 Cans of beer per week    Comment: 18  . Drug use: No    Home Medications Prior to Admission medications   Medication Sig Start Date End Date Taking? Authorizing  Provider  acetaminophen (TYLENOL 8 HOUR) 650 MG CR tablet Take 1 tablet (650 mg total) by mouth every 8 (eight) hours. 01/31/20  Yes Varney Biles, MD  HYDROcodone-acetaminophen (NORCO/VICODIN) 5-325 MG tablet Take 2 tablets by mouth every 4 (four) hours as needed. 01/31/20  Yes Varney Biles, MD  ibuprofen (ADVIL) 600 MG tablet Take 1 tablet (600 mg total) by mouth every 12 (twelve) hours as needed. 01/31/20  Yes Dorse Locy, MD  Lidocaine 4 % PTCH Apply 1 patch topically 2 (two) times daily. 01/31/20  Yes Varney Biles, MD  methocarbamol (ROBAXIN) 500 MG tablet Take 1 tablet (500 mg total) by mouth 2 (two) times daily. 01/31/20  Yes Varney Biles, MD  albuterol (VENTOLIN HFA) 108 (90 Base) MCG/ACT inhaler Inhale 2 puffs into the lungs every 6 (six) hours as needed for wheezing or shortness of breath.  05/22/19   [provider]  ALPRAZolam (XANAX) 0.5 MG tablet TAKE 1 TABLET BY MOUTH EVERY 8 HOURS AS NEEDED  FOR SLEEP OR ANXIETY 10/09/17   McGowen, Adrian Blackwater, MD  amLODipine (NORVASC) 5 MG tablet Take 1 tablet (5 mg total) by mouth daily. 07/04/19   Evans Lance, MD  aspirin EC 81 MG tablet Take 81 mg by mouth daily.    [provider]  atorvastatin (LIPITOR) 40 MG tablet Take 1 tablet by mouth once daily 09/01/19   Lelon Perla, MD  benazepril (LOTENSIN) 40 MG tablet Take 1 tablet (40 mg total) by mouth daily. 07/04/19   Evans Lance, MD  carvedilol (COREG) 25 MG tablet Take 1 tablet (25 mg total) by mouth 2 (two) times daily. 07/18/19 07/17/20  Fenton, Clint R, PA  dofetilide (TIKOSYN) 500 MCG capsule Take 1 capsule (500 mcg total) by mouth 2 (two) times daily. 08/26/19   Fenton, Clint R, PA  furosemide (LASIX) 40 MG tablet Take 1 tablet by mouth once daily 01/10/19   McGowen, Adrian Blackwater, MD  glipiZIDE (GLUCOTROL XL) 10 MG 24 hr tablet Take 1 tablet by mouth once daily with breakfast 05/23/19   McGowen, Adrian Blackwater, MD  Insulin Pen Needle 31G X 5 MM MISC Use with Victoza as  directed 01/15/18   McGowen, Adrian Blackwater, MD  Iron, Ferrous Sulfate, 325 (65 Fe) MG TABS TAKE 1 TABLET BY MOUTH ONCE DAILY WITH BREAKFAST 08/15/19   [provider]  liraglutide (VICTOZA) 18 MG/3ML SOPN INJECT 1.2MG  SUBCUTANEOUSLY ONCE DAILY 02/19/19   McGowen, Adrian Blackwater, MD  magnesium oxide (MAG-OX) 400 (241.3 Mg) MG tablet Take 1 tablet (400 mg total) by mouth 2 (two) times daily. 08/26/19   Fenton, Clint R, PA  metFORMIN (GLUCOPHAGE) 1000 MG tablet TAKE 1 TABLET BY MOUTH TWICE DAILY WITH A MEAL 02/19/19   McGowen, Adrian Blackwater, MD  pregabalin (LYRICA) 150 MG capsule Take 1 capsule by mouth twice daily 02/10/19   McGowen, Adrian Blackwater, MD  tamsulosin (FLOMAX) 0.4 MG CAPS capsule Take 0.4 mg by mouth daily in the afternoon.     [provider]  testosterone cypionate (DEPOTESTOSTERONE CYPIONATE) 200 MG/ML injection 1 ml IM q 14 days 01/08/19   McGowen, Adrian Blackwater, MD  XARELTO 20 MG TABS tablet TAKE 1 TABLET BY MOUTH ONCE DAILY WITH  SUPPER 05/23/19   Fenton, Clint R, PA  XIAFLEX 0.9 MG SOLR  04/24/19   [provider]  zaleplon (SONATA) 5 MG capsule TAKE 1 CAPSULE BY MOUTH AT BEDTIME AS NEEDED FOR SLEEP 02/19/19   McGowen, Adrian Blackwater, MD    Allergies    Patient has no known allergies.  Review of Systems   Review of Systems  Constitutional: Positive for activity change.  Respiratory: Negative for shortness of breath.   Cardiovascular: Negative for chest pain.  Gastrointestinal: Negative for abdominal pain.  Genitourinary: Negative for dysuria.  Musculoskeletal: Positive for back pain.  All other systems reviewed and are negative.   Physical Exam Updated Vital Signs BP 124/70 (BP Location: Right Arm)   Pulse 64   Temp 97.6 F (36.4 C) (Oral)   Resp 18   Ht 6\' 1"  (1.854 m)   SpO2 95%   BMI 38.76 kg/m   Physical Exam Vitals and nursing note reviewed.  Constitutional:      Appearance: He is well-developed.  HENT:     Head: Atraumatic.  Cardiovascular:     Rate and Rhythm:  Normal rate.  Pulmonary:     Effort: Pulmonary effort is normal.  Abdominal:     Tenderness: There is no abdominal tenderness.  Musculoskeletal:     Cervical back: Neck supple.     Comments: Patient has reproducible tenderness over the right flank region and right paravertebral lumbar spine region.  No midline L-spine tenderness.  Skin:    General: Skin is warm.  Neurological:     Mental Status: He is alert and oriented to person, place, and time.     ED Results / Procedures / Treatments   Labs (all labs ordered are listed, but only abnormal results are displayed) Labs Reviewed  COMPREHENSIVE METABOLIC PANEL - Abnormal; Notable for the following components:      Result Value   Sodium 134 (*)    Glucose, Bld 117 (*)    All other components within normal limits  CBC WITH DIFFERENTIAL/PLATELET  URINALYSIS, ROUTINE W REFLEX MICROSCOPIC    EKG None  Radiology US Renal  Result Date: 01/31/2020 CLINICAL DATA:  RIGHT flank pain and dark urine today, question hydronephrosis EXAM: RENAL / URINARY TRACT ULTRASOUND COMPLETE COMPARISON:  Abdomen ultrasound 01/22/2013 FINDINGS: Right Kidney: Renal measurements: 11.4 x 5.7 x 4.6 cm = volume: 165 mL. Normal cortical thickness and echogenicity. No mass, hydronephrosis or shadowing calcification. Left Kidney: Renal measurements: 11.2 x 5.9 x 5.1 cm = volume: 176 mL. Normal cortical thickness and echogenicity. No mass, hydronephrosis or shadowing calcification. Bladder: Appears normal for degree of bladder distention. Other: Minimal prostatic enlargement, gland measuring 4.0 x 3.2 x 3.5 cm (volume = 23 cm^3). Increased echogenicity of liver, without mass or nodularity on limited assessment, question fatty infiltration, also noted on prior ultrasound exam. IMPRESSION: Unremarkable renal ultrasound. Suspected fatty infiltration of liver as above. Minimal prostatic enlargement. Electronically Signed   By: Ulyses Southward M.D.   On: 01/31/2020 12:23     Procedures Procedures   Medications Ordered in ED Medications  oxyCODONE-acetaminophen (PERCOCET/ROXICET) 5-325 MG per tablet 1 tablet (has no administration in time range)  oxyCODONE-acetaminophen (PERCOCET/ROXICET) 5-325 MG per tablet 1 tablet (1 tablet Oral Given 01/31/20 1136)  diazepam (VALIUM) tablet 5 mg (5 mg Oral Given 01/31/20 1136)    ED Course  I have reviewed the triage vital signs and the nursing notes.  Pertinent labs & imaging results that were available during my care of the patient were reviewed by me and considered in my medical decision making (see chart for details).  Clinical Course as of 01/31/20 1302  Sat Jan 31, 2020  1259 US Renal Imaging did not reveal any evidence of hydronephrosis.  UA is not showing any signs of infection or hematuria.  CBC and metabolic profile are also reassuring.  Clinically, it does not appear that patient is having kidney stone, kidney infection.  I reassessed the patient.  There is still no abdominal tenderness.  Patient still has reproducible tenderness over the right flank region with palpation.  Clinically, patient has severe muscle spasm.  Will treat conservatively. Strict ER return precautions have been discussed, and patient is agreeing with the plan and is comfortable with the workup done and the recommendations from the ER.  [AN]    Clinical Course User Index [AN] Derwood Kaplan, MD   MDM Rules/Calculators/A&P                          60 year old male with multiple medical comorbidities comes in with chief complaint of right-sided back pain.  His pain is in the flank region.  There is no UTI-like symptoms associated with it and patient denies any history of kidney infection,  kidney stones.  There is no midline L-spine tenderness and no red flags suggesting spinal cord compression.  On exam there is palpable spasm on the right side, and the tenderness is reproduced with manipulation of that region.  Unclear etiology,  however suspicion is high for musculoskeletal pain versus kidney stones.  UTI is also possible.  We have ordered basic labs, UA and ultrasound kidney to evaluate for any signs of obstructive stone leading to hydronephrosis.  Although patient recently had upper and lower endoscopy, this does not appear to be a complication of the surgery.  He has no abdominal tenderness on exam and the pain is reproducible with movement -does not appear to be a visceral pain from perforated viscus etc.  Final Clinical Impression(s) / ED Diagnoses Final diagnoses:  Lumbar radiculopathy  Muscle spasm of back    Rx / DC Orders ED Discharge Orders         Ordered    HYDROcodone-acetaminophen (NORCO/VICODIN) 5-325 MG tablet  Every 4 hours PRN        01/31/20 1256    ibuprofen (ADVIL) 600 MG tablet  Every 12 hours PRN        01/31/20 1256    methocarbamol (ROBAXIN) 500 MG tablet  2 times daily        01/31/20 1256    Lidocaine 4 % PTCH  2 times daily        01/31/20 1256    acetaminophen (TYLENOL 8 HOUR) 650 MG CR tablet  Every 8 hours        01/31/20 1256             Derwood Kaplan, MD 01/31/20 1303

## 2020-01-31 NOTE — ED Notes (Signed)
Pt ambulatory with steady gait to room, to restroom to provide urine specimen

## 2020-02-04 ENCOUNTER — Other Ambulatory Visit (HOSPITAL_BASED_OUTPATIENT_CLINIC_OR_DEPARTMENT_OTHER): Payer: Self-pay | Admitting: Emergency Medicine

## 2020-02-04 ENCOUNTER — Emergency Department (HOSPITAL_BASED_OUTPATIENT_CLINIC_OR_DEPARTMENT_OTHER): Payer: Commercial Managed Care - PPO

## 2020-02-04 ENCOUNTER — Emergency Department (HOSPITAL_BASED_OUTPATIENT_CLINIC_OR_DEPARTMENT_OTHER)
Admission: EM | Admit: 2020-02-04 | Discharge: 2020-02-04 | Disposition: A | Discharge status: Home / Self Care | Payer: Commercial Managed Care - PPO | Attending: Emergency Medicine | Admitting: Emergency Medicine

## 2020-02-04 ENCOUNTER — Other Ambulatory Visit: Payer: Self-pay

## 2020-02-04 ENCOUNTER — Encounter (HOSPITAL_BASED_OUTPATIENT_CLINIC_OR_DEPARTMENT_OTHER): Payer: Self-pay

## 2020-02-04 DIAGNOSIS — X58XXXA Exposure to other specified factors, initial encounter: Secondary | ICD-10-CM | POA: Diagnosis not present

## 2020-02-04 DIAGNOSIS — I10 Essential (primary) hypertension: Secondary | ICD-10-CM | POA: Diagnosis not present

## 2020-02-04 DIAGNOSIS — Z79899 Other long term (current) drug therapy: Secondary | ICD-10-CM | POA: Insufficient documentation

## 2020-02-04 DIAGNOSIS — F17290 Nicotine dependence, other tobacco product, uncomplicated: Secondary | ICD-10-CM | POA: Insufficient documentation

## 2020-02-04 DIAGNOSIS — S32009A Unspecified fracture of unspecified lumbar vertebra, initial encounter for closed fracture: Secondary | ICD-10-CM | POA: Diagnosis not present

## 2020-02-04 DIAGNOSIS — Z7984 Long term (current) use of oral hypoglycemic drugs: Secondary | ICD-10-CM | POA: Diagnosis not present

## 2020-02-04 DIAGNOSIS — Z794 Long term (current) use of insulin: Secondary | ICD-10-CM | POA: Insufficient documentation

## 2020-02-04 DIAGNOSIS — I2510 Atherosclerotic heart disease of native coronary artery without angina pectoris: Secondary | ICD-10-CM | POA: Insufficient documentation

## 2020-02-04 DIAGNOSIS — S301XXA Contusion of abdominal wall, initial encounter: Secondary | ICD-10-CM | POA: Diagnosis not present

## 2020-02-04 DIAGNOSIS — E119 Type 2 diabetes mellitus without complications: Secondary | ICD-10-CM | POA: Insufficient documentation

## 2020-02-04 DIAGNOSIS — S3992XA Unspecified injury of lower back, initial encounter: Secondary | ICD-10-CM | POA: Diagnosis present

## 2020-02-04 DIAGNOSIS — Z7901 Long term (current) use of anticoagulants: Secondary | ICD-10-CM | POA: Insufficient documentation

## 2020-02-04 DIAGNOSIS — Z7982 Long term (current) use of aspirin: Secondary | ICD-10-CM | POA: Insufficient documentation

## 2020-02-04 DIAGNOSIS — M545 Low back pain, unspecified: Secondary | ICD-10-CM

## 2020-02-04 MED ORDER — ACETAMINOPHEN 500 MG PO TABS
1000.0000 mg | ORAL_TABLET | Freq: Once | ORAL | Status: AC
  Administered 2020-02-04: 1000 mg via ORAL
  Filled 2020-02-04: qty 2

## 2020-02-04 MED ORDER — DICLOFENAC SODIUM 1 % EX GEL: 4.0000 g | Freq: Four times a day (QID) | CUTANEOUS | 0 refills | Status: DC

## 2020-02-04 MED ORDER — OXYCODONE HCL 5 MG PO TABS
5.0000 mg | ORAL_TABLET | Freq: Once | ORAL | Status: AC
  Administered 2020-02-04: 5 mg via ORAL
  Filled 2020-02-04: qty 1

## 2020-02-04 MED ORDER — IOHEXOL 300 MG/ML  SOLN
100.0000 mL | Freq: Once | INTRAMUSCULAR | Status: AC | PRN
  Administered 2020-02-04: 100 mL via INTRAVENOUS

## 2020-02-04 MED FILL — DICLOFENAC SODIUM 1 % GEL: 1 | 6 days supply | Qty: 100 | Fill #0

## 2020-02-04 NOTE — ED Triage Notes (Signed)
Bp reports on xalrelto

## 2020-02-04 NOTE — ED Provider Notes (Signed)
[facility] Provider Note   CSN: [id number] Arrival date & time: 02/04/20  0831     History Chief Complaint  Patient presents with  . Back Pain    Ethan Pitts is a 60 y.o. male.  60 yo M with a chief complaints of right-sided low back pain.  Radiates to the right buttock.  Going on for about a week now.  The patient was seen in the ED on Saturday was diagnosed with likely lumbar radiculopathy and discharged home.  Has had a virtual visit with his family provider and since then is also developed a right flank hematoma.  The patient had a colonoscopy and endoscopy that were reportedly unremarkable a few days prior to developing pain.  He denies numbness or weakness to his right leg.  Has chronic weakness to his left leg but is unchanged.  Denies any loss of bowel or bladder denies loss of peritoneal sensation.  Is on Xarelto for A. fib.  Denies history of cancer.  Denies trauma.  The history is provided by the patient.  Back Pain Location:  Lumbar spine Quality:  Shooting and burning Radiates to: R buttock. Pain severity:  Moderate Pain is:  Same all the time Onset quality:  Gradual Duration:  1 week Timing:  Constant Progression:  Unchanged Chronicity:  New Relieved by:  Being still Worsened by:  Bending, sitting and movement Ineffective treatments:  None tried Associated symptoms: no abdominal pain, no chest pain, no fever and no headaches        Past Medical History:  Diagnosis Date  . Anxiety   . Arthritis    lower back   . Atrial flutter ([place]) 2020   Cards->lopressor and anticoag, ablation. In sinus still as of 08/2018 cards f/u.  Recurrence + PAF->requiring pt to get back on xarelto  . BPH with obstruction/lower urinary tract symptoms    started flomax via urol 03/2019  . CAD (coronary artery disease) 03/2018   Ostial first diagonal 60% narrowed, otherwise normal coronaries. Dr. Stanford Breed added ASA and statin 08/2018.  Marland Kitchen  Depression   . Diabetes mellitus   . False positive stress test 03/2018   Cath showed normal coronaries with EF 50%  . GERD (gastroesophageal reflux disease)   . Hyperlipidemia   . Hypertension   . Hypogonadism, male 05/2015  . Increased prostate specific antigen (PSA) velocity 2019   06/2017 velocity up; recheck 10/2017 back to baseline.  Plan repeat 1 yr.  . Low back pain 2016   Left lumbar radiculopathy, lumbar spondylolisthesis.  Sciatica episode 03/2016.  Ortho getting L spine MRI as of 07/2016.  . Microcytic anemia 12/2015   suspect iron def due to malabsorption.  Hemoccults NEG 01/26/16, ferrous sulfate started.  . Neuropathic pain of left foot   . NICM (nonischemic cardiomyopathy) ([place]) 05/2018   EF 40-45%, +LV hypokinesis. Suspected to be tachycardia-mediated->Dr. Stanford Breed to rpt echo fall 2020.  Marland Kitchen OSA on CPAP    cpap settings at 3  . Peyronie's disease 2021   Dr. Porfirio Oar (collegenase intralesional injections)    Patient Active Problem List   Diagnosis Date Noted  . Chronic anticoagulation 04/17/2019  . Paroxysmal atrial fibrillation ([place]) 01/14/2019  . Secondary hypercoagulable state ([place]) 01/14/2019  . NICM (nonischemic cardiomyopathy) ([place]) 12/04/2018  . CAD (coronary artery disease) 12/04/2018  . Abnormal nuclear stress test 03/21/2018  . Depression   . Acute bronchitis with symptoms > 10 days 02/12/2018  . Atrial flutter ([place]) 02/12/2018  .  Microcytic anemia 12/03/2015  . Hypogonadism, male 05/03/2015  . Diabetes mellitus without complication ([place]) 06/25/7626  . Insomnia 03/05/2014  . Encounter for vitamin deficiency screening 02/26/2014  . Preventative health care 02/26/2014  . BMI 37.0-37.9, adult 02/26/2014  . Lumbar pain with radiation down left leg 02/26/2014  . 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 Morbid obesity ([place]) 05/03/2009  . DEGENERATIVE JOINT DISEASE 05/03/2009  . DM type 2 (diabetes mellitus, type 2) ([place]) 02/17/2009  .  Hyperlipidemia 02/17/2009  . Sleep apnea in adult 08/23/2007  . Essential hypertension 05/22/2006    Past Surgical History:  Procedure Laterality Date  . A-FLUTTER ABLATION N/A 07/09/2018   Procedure: A-FLUTTER ABLATION;  Surgeon: Evans Lance, MD;  Location: [facility];  Service: Cardiovascular;  Laterality: N/A;  . ADENOIDECTOMY    . BREATH TEK H PYLORI N/A 01/08/2013   Procedure: [place];  Surgeon: [name], MD;  Location: [facility];  Service: General;  Laterality: N/A;  . CARDIAC CATHETERIZATION  03/2018   No signi obstructive CAD (suspected FALSE POSITIVE STRESS TEST).  EF 50%.  Diastolic dysfunction.  Marland Kitchen CARDIOVASCULAR STRESS TEST  03/2018   Intermediate risk; EF 40%, medium sized defect with peri-infarct ischemia-->follow up cath showed no signif obstructive CAD.  Marland Kitchen COLONOSCOPY  04/17/2014   Benign polyp x 1.  Recall 10 yrs.  Marland Kitchen GASTRIC ROUX-EN-Y N/A 04/14/2013   Procedure: LAPAROSCOPIC ROUX-EN-Y GASTRIC BYPASS WITH UPPER ENDOSCOPY;  Surgeon: [name], MD;  Location: [facility];  Service: General;  Laterality: N/A;  . LEFT HEART CATH AND CORONARY ANGIOGRAPHY N/A 03/22/2018   NO CAD.  EF 50%. Procedure: LEFT HEART CATH AND CORONARY ANGIOGRAPHY;  Surgeon: Belva Crome, MD;  Location: [facility];  Service: Cardiovascular;  Laterality: N/A;  . Rhythm monitoring  01/08/2019   7 day monitor->Sinus rhythm with occasional PAC, PVC, paroxysmal atrial flutter, paroxysmal atrial fibrillation and 7 beats nonsustained ventricular tachycardia: xarelto restarted  . TONSILLECTOMY    . TRANSTHORACIC ECHOCARDIOGRAM  05/13/2018;12/03/18   05/2018 severe hypokinesis of LV inferior and inferolateral walls, EF 40-45%, correlates with stress test.  31/5176->HY 07-37%, diastolic fxn/LV filling pressure not determined due to pt in a fib/flutter       Family History  Problem Relation Age of Onset  . Heart disease Father        valvular disease  . COPD Father   .  Prostate cancer Father   . Arrhythmia Father   . Colon cancer Neg Hx     Social History   Tobacco Use  . Smoking status: Never Smoker  . Smokeless tobacco: Former Systems developer    Types: Chew  Substance Use Topics  . Alcohol use: Yes    Alcohol/week: 18.0 standard drinks    Types: 18 Cans of beer per week    Comment: 18  . Drug use: No    Home Medications Prior to Admission medications   Medication Sig Start Date End Date Taking? Authorizing Provider  amLODipine (NORVASC) 5 MG tablet Take 1 tablet (5 mg total) by mouth daily. 07/04/19  Yes Evans Lance, MD  aspirin EC 81 MG tablet Take 81 mg by mouth daily.   Yes [provider]  atorvastatin (LIPITOR) 40 MG tablet Take 1 tablet by mouth once daily 09/01/19  Yes Crenshaw, Denice Bors, MD  benazepril (LOTENSIN) 40 MG tablet Take 1 tablet (40 mg total) by mouth daily. 07/04/19  Yes Evans Lance, MD  carvedilol (COREG) 25 MG tablet Take 1 tablet (25 mg total) by mouth 2 (two) times daily. 07/18/19 07/17/20 Yes Fenton, Clint R, PA  diclofenac Sodium (VOLTAREN) 1 % GEL Apply 4 g topically 4 (four) times daily. 02/04/20  Yes Deno Etienne, DO  dofetilide (TIKOSYN) 500 MCG capsule Take 1 capsule (500 mcg total) by mouth 2 (two) times daily. 08/26/19  Yes Fenton, Clint R, PA  glipiZIDE (GLUCOTROL XL) 10 MG 24 hr tablet Take 1 tablet by mouth once daily with breakfast 05/23/19  Yes McGowen, Adrian Blackwater, MD  Iron, Ferrous Sulfate, 325 (65 Fe) MG TABS TAKE 1 TABLET BY MOUTH ONCE DAILY WITH BREAKFAST 08/15/19  Yes [provider]  Lidocaine 4 % PTCH Apply 1 patch topically 2 (two) times daily. 01/31/20  Yes Varney Biles, MD  oxyCODONE-acetaminophen (PERCOCET) 10-325 MG tablet Take by mouth. 02/02/20 02/07/20 Yes [provider]  tiZANidine (ZANAFLEX) 4 MG tablet Take by mouth. 02/02/20 02/01/21 Yes [provider]  XARELTO 20 MG TABS tablet TAKE 1 TABLET BY MOUTH ONCE DAILY WITH  SUPPER 05/23/19  Yes Fenton, Clint R, PA  acetaminophen  (TYLENOL 8 HOUR) 650 MG CR tablet Take 1 tablet (650 mg total) by mouth every 8 (eight) hours. 01/31/20   Varney Biles, MD  albuterol (VENTOLIN HFA) 108 (90 Base) MCG/ACT inhaler Inhale 2 puffs into the lungs every 6 (six) hours as needed for wheezing or shortness of breath.  05/22/19   [provider]  ALPRAZolam (XANAX) 0.5 MG tablet TAKE 1 TABLET BY MOUTH EVERY 8 HOURS AS NEEDED FOR SLEEP OR ANXIETY 10/09/17   McGowen, Adrian Blackwater, MD  furosemide (LASIX) 40 MG tablet Take 1 tablet by mouth once daily 01/10/19   McGowen, Adrian Blackwater, MD  HYDROcodone-acetaminophen (NORCO/VICODIN) 5-325 MG tablet Take 2 tablets by mouth every 4 (four) hours as needed. 01/31/20   Varney Biles, MD  ibuprofen (ADVIL) 600 MG tablet Take 1 tablet (600 mg total) by mouth every 12 (twelve) hours as needed. 01/31/20   Varney Biles, MD  Insulin Pen Needle 31G X 5 MM MISC Use with Victoza as directed 01/15/18   McGowen, Adrian Blackwater, MD  liraglutide (VICTOZA) 18 MG/3ML SOPN INJECT 1.2MG  SUBCUTANEOUSLY ONCE DAILY 02/19/19   McGowen, Adrian Blackwater, MD  magnesium oxide (MAG-OX) 400 (241.3 Mg) MG tablet Take 1 tablet (400 mg total) by mouth 2 (two) times daily. 08/26/19   Fenton, Clint R, PA  metFORMIN (GLUCOPHAGE) 1000 MG tablet TAKE 1 TABLET BY MOUTH TWICE DAILY WITH A MEAL 02/19/19   McGowen, Adrian Blackwater, MD  methocarbamol (ROBAXIN) 500 MG tablet Take 1 tablet (500 mg total) by mouth 2 (two) times daily. 01/31/20   Varney Biles, MD  pregabalin (LYRICA) 150 MG capsule Take 1 capsule by mouth twice daily 02/10/19   McGowen, Adrian Blackwater, MD  tamsulosin (FLOMAX) 0.4 MG CAPS capsule Take 0.4 mg by mouth daily in the afternoon.     [provider]  testosterone cypionate (DEPOTESTOSTERONE CYPIONATE) 200 MG/ML injection 1 ml IM q 14 days 01/08/19   Tammi Sou, MD  XIAFLEX 0.9 MG SOLR  04/24/19   [provider]  zaleplon (SONATA) 5 MG capsule TAKE 1 CAPSULE BY MOUTH AT BEDTIME AS NEEDED FOR SLEEP 02/19/19   McGowen, Adrian Blackwater, MD     Allergies    Patient has no known allergies.  Review of Systems   Review of Systems  Constitutional: Negative for chills and fever.  HENT: Negative for congestion and facial swelling.  Eyes: Negative for discharge and visual disturbance.  Respiratory: Negative for shortness of breath.   Cardiovascular: Negative for chest pain and palpitations.  Gastrointestinal: Negative for abdominal pain, diarrhea and vomiting.  Musculoskeletal: Positive for back pain. Negative for arthralgias and myalgias.  Skin: Negative for color change and rash.  Neurological: Negative for tremors, syncope and headaches.  Psychiatric/Behavioral: Negative for confusion and dysphoric mood.    Physical Exam Updated Vital Signs BP 136/73   Pulse 63   Temp 97.7 F (36.5 C) (Oral)   Resp 14   Ht 6\' 1"  (1.854 m)   Wt 133.3 kg   SpO2 98%   BMI 38.77 kg/m   Physical Exam Vitals and nursing note reviewed.  Constitutional:      Appearance: He is well-developed and well-nourished.  HENT:     Head: Normocephalic and atraumatic.  Eyes:     Extraocular Movements: EOM normal.     Pupils: Pupils are equal, round, and reactive to light.  Neck:     Vascular: No JVD.  Cardiovascular:     Rate and Rhythm: Normal rate and regular rhythm.     Heart sounds: No murmur heard. No friction rub. No gallop.   Pulmonary:     Effort: No respiratory distress.     Breath sounds: No wheezing.  Abdominal:     General: There is no distension.     Tenderness: There is no guarding or rebound.  Musculoskeletal:        General: Tenderness present. Normal range of motion.     Cervical back: Normal range of motion and neck supple.     Comments: Diffuse tenderness to the low back.  A bruise along the midline and then a right flank hematoma.  Pulse motor and sensation intact to the right lower extremity.  Negative straight leg raise test bilaterally.  No clonus reflexes are 2+ and equal.  Skin:    Coloration: Skin is not  pale.     Findings: No rash.  Neurological:     Mental Status: He is alert and oriented to person, place, and time.  Psychiatric:        Mood and Affect: Mood and affect normal.        Behavior: Behavior normal.     ED Results / Procedures / Treatments   Labs (all labs ordered are listed, but only abnormal results are displayed) Labs Reviewed - No data to display  EKG None  Radiology CT ABDOMEN PELVIS W CONTRAST  Result Date: 02/04/2020 CLINICAL DATA:  Abdominal pain.  Flank hematoma. EXAM: CT ABDOMEN AND PELVIS WITH CONTRAST TECHNIQUE: Multidetector CT imaging of the abdomen and pelvis was performed using the standard protocol following bolus administration of intravenous contrast. CONTRAST:  168mL OMNIPAQUE IOHEXOL 300 MG/ML  SOLN COMPARISON:  None. FINDINGS: Lower chest: Included lung bases are clear.  Heart size is normal. Hepatobiliary: Punctate calcification at the right hepatic dome, likely sequela of chronic granulomatous disease. Liver otherwise unremarkable. Normal appearance of the gallbladder. No hyperdense gallstone. No biliary dilatation. Pancreas: Unremarkable. No pancreatic ductal dilatation or surrounding inflammatory changes. Spleen: Normal in size without focal abnormality. Adrenals/Urinary Tract: Unremarkable adrenal glands. Kidneys enhance symmetrically without focal lesion, stone, or hydronephrosis. Ureters are nondilated. Urinary bladder appears unremarkable. 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 No bowel obstruction. Minimal scattered colonic diverticulosis. No focal bowel wall thickening or inflammatory changes. Multiple hyperdense appendicoliths within a noninflamed appendix in the right lower quadrant (series 5, images 53-55). 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 Minimal scattered atherosclerotic calcification throughout the aortoiliac axis. No abdominopelvic lymphadenopathy.  Reproductive: Mildly enlarged, heterogeneous prostate gland with mass effect upon the base of the urinary bladder. Other: No free fluid. No abdominopelvic fluid collection. No pneumoperitoneum. No abdominal wall hernia. Musculoskeletal: Mixed density collection within the subcutaneous soft tissues overlying the posterior paraspinal musculature, right of midline, at the level of the thoracolumbar junction. Collection measures approximately 8.3 x 2.5 x 6.6 cm (series 2, image 37; series 6, image 69). Minimally displaced fracture of the right L2 transverse process (series 2, image 39). Transitional lumbosacral anatomy with suspected lumbarization of the S1 segment. Induration within the subcutaneous fat of the lower anterior abdominal wall, right greater than left, which may reflect seatbelt type soft tissue injury (series 2, image 73). IMPRESSION: 1. Acute minimally displaced fracture of the right L2 transverse process. 2. Mixed density collection within the subcutaneous soft tissues overlying the posterior paraspinal musculature, right of midline, at the level of the thoracolumbar junction. Collection measures approximately 8.3 x 2.5 x 6.6 cm. Findings are most suggestive of a hematoma. 3. 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 4. Mildly enlarged, heterogeneous prostate gland with mass effect upon the base of the urinary bladder. Correlate with serum PSA. 5. 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 Aortic Atherosclerosis (ICD10-I70.0). Electronically Signed   By: Davina Poke D.O.   On: 02/04/2020 11:19   CT L-SPINE NO CHARGE  Addendum Date: 02/04/2020   ADDENDUM REPORT: 02/04/2020 11:19 ADDENDUM: Acute right L2 transverse process fracture better seen on concurrent CT abdomen. Electronically Signed   By: Macy Mis M.D.   On: 02/04/2020 11:19   Result Date: 02/04/2020 CLINICAL DATA:  Mid to right low back pain with bruising EXAM: CT lumbar spine without  contrast TECHNIQUE: Multiplanar CT images of the lumbar spine were reconstructed from contemporary CT of the abdomen and pelvis CONTRAST:  None additional COMPARISON:  None FINDINGS: Segmentation: Transitional anatomy at the lumbosacral junction. There is pseudoarthrosis of the right L5 transverse process with the sacrum. Alignment: Mild retrolisthesis at L2-L3 and L3-L4. Vertebrae: Multilevel degenerative endplate irregularity and Schmorl's nodes. No acute fracture. Paraspinal and other soft tissues: Extra-spinal findings are better evaluated on concurrent dedicated imaging. Disc levels: Multilevel degenerative changes are present with disc space narrowing, endplate osteophytes, and facet hypertrophy. No high-grade osseous encroachment on the spinal canal. IMPRESSION: No acute fracture. Electronically Signed: By: Macy Mis M.D. On: 02/04/2020 11:11    Procedures Procedures   Medications Ordered in ED Medications  acetaminophen (TYLENOL) tablet 1,000 mg (1,000 mg Oral Given 02/04/20 0957)  oxyCODONE (Oxy IR/ROXICODONE) immediate release tablet 5 mg (5 mg Oral Given 02/04/20 0958)  iohexol (OMNIPAQUE) 300 MG/ML solution 100 mL (100 mLs Intravenous Contrast Given 02/04/20 1026)    ED Course  I have reviewed the triage vital signs and the nursing notes.  Pertinent labs & imaging results that were available during my care of the patient were reviewed by me and considered in my medical decision making (see chart for details).    MDM Rules/Calculators/A&P                          60 yo M with a cc of R flank pain.  This been going on for about 4 or 5 days now.  No red flags over the patient is on a blood thinning medication and has a very large  right flank hematoma.  This is also recently post endoscopy and colonoscopy.  Will obtain a CT of the abdomen pelvis to evaluate for signs of bleeding.  CT no cost reformats of the L-spine.  CT scan without acute intra-abdominal pathology.  No obvious bowel  or aortic injury.  Patient with a soft tissue hematoma and an L2 transverse process fracture.  I did discuss this with neurosurgery who felt the patient could get a lumbar corset difficult benefit them but nothing to do from their standpoint and recommended family practice follow-up.  No discussed results with the patient.  We will have him follow-up with his family doctor.  12:23 PM:  I have discussed the diagnosis/risks/treatment options with the patient and believe the pt to be eligible for discharge home to follow-up with PCP. We also discussed returning to the ED immediately if new or worsening sx occur. We discussed the sx which are most concerning (e.g., sudden worsening pain, fever, inability to tolerate by mouth, cauda equina s/sx) that necessitate immediate return. Medications administered to the patient during their visit and any new prescriptions provided to the patient are listed below.  Medications given during this visit Medications  acetaminophen (TYLENOL) tablet 1,000 mg (1,000 mg Oral Given 02/04/20 0957)  oxyCODONE (Oxy IR/ROXICODONE) immediate release tablet 5 mg (5 mg Oral Given 02/04/20 0958)  iohexol (OMNIPAQUE) 300 MG/ML solution 100 mL (100 mLs Intravenous Contrast Given 02/04/20 1026)     The patient appears reasonably screen and/or stabilized for discharge and I doubt any other medical condition or other [facility] requiring further screening, evaluation, or treatment in the ED at this time prior to discharge.   Final Clinical Impression(s) / ED Diagnoses Final diagnoses:  Low back pain  Lumbar transverse process fracture, closed, initial encounter ([place])    Rx / DC Orders ED Discharge Orders         Ordered    diclofenac Sodium (VOLTAREN) 1 % GEL  4 times daily        02/04/20 [place], Yee Joss, DO 02/04/20 1223

## 2020-02-04 NOTE — ED Notes (Signed)
ED Provider at bedside. 

## 2020-02-04 NOTE — Discharge Instructions (Signed)
Your back pain is most likely due to a muscular strain.  There is been a lot of research on back pain, unfortunately the only thing that seems to really help is Tylenol and ibuprofen.  Relative rest is also important to not lift greater than 10 pounds bending or twisting at the waist.  Please follow-up with your family physician.  The other thing that really seems to benefit patients is physical therapy which your doctor may send you for.  Please return to the emergency department for new numbness or weakness to your arms or legs. Difficulty with urinating or urinating or pooping on yourself.  Also if you cannot feel toilet paper when you wipe or get a fever.    Also take tylenol 1000mg(2 extra strength) four times a day. Use the gel as prescribed.   

## 2020-02-04 NOTE — ED Triage Notes (Signed)
Pt reports here for back pain. Was seen here Saturday for the same. Pt reports tearing sensation and bruising  To back and right flank. Pt denies any known injury

## 2020-02-10 ENCOUNTER — Ambulatory Visit: Payer: Commercial Managed Care - PPO | Admitting: Cardiology

## 2020-02-20 ENCOUNTER — Other Ambulatory Visit (HOSPITAL_COMMUNITY): Payer: Self-pay | Admitting: Physician Assistant

## 2020-03-08 NOTE — Progress Notes (Signed)
Primary Care Physician: Jettie Booze, NP  Referring Physician: [facility] Primary Cardiologist: Dr Stanford Breed Primary EP: Dr Cleda Mccreedy is a 60 y.o. male with a history of atrial flutter, OSA, HTN, DM who presents for follow up in the [facility].  The patient was initially diagnosed with atrial flutter 02/03/18 when his Apple Watch showed that he had an irregular rhythm. 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 He had no awareness of his arrhythmia. Patient is s/p atrial flutter ablation 07/09/18 with Dr Lovena Le. Zio patch 12/2018 showed 35% afib burden with rapid rates at times. He was started back on Xarelto for a CHADS2VASC score of 4 by Dr Stanford Breed. Patient was seen at the ER 04/08/19 for hypotension. Patient states he was inadvertently taking metoprolol and carvedilol together. He was also found to be in afib but converted to SR after fluid bolus. Since stopping both carvedilol and metoprolol, he has noted some heart rates up to 110s. Prior to this, he states his afib has been well controlled with few episodes on his Apple Watch and always rate controlled. On follow up with [facility] 04/17/19, patient was in rate controlled atrial flutter but with several days of elevated heart rates on his Apple Watch.   On follow up today, patient reports doing well from a cardiac standpoint. He had one episode of tachypalpitations which lasted about 1-2 hours. He did fall on his deck and was seen at the ED with ecchymosis and a transverse process L2 fracture.   Today, he denies symptoms of chest pain, SOB, orthopnea, PND, lower extremity edema, dizziness, presyncope, syncope, bleeding, or neurologic sequela. The patient is tolerating medications without difficulties and is otherwise without complaint today.    Atrial Fibrillation Risk Factors:  he does have  symptoms or diagnosis of sleep apnea. he is compliant with CPAP therapy. he does not have a history of rheumatic fever. he does not have a history of significant alcohol use.  he has a BMI of Body mass index is 38.08 kg/m.Marland Kitchen Filed Weights   03/09/20 0900  Weight: 130.9 kg     Atrial Fibrillation Management history:  Previous antiarrhythmic drugs: dofetilide  Previous cardioversions: none Previous ablations: 07/09/18 flutter CHADS2VASC score: 4 (DM, HTN, CAD, CM) Anticoagulation history: Xarelto   Past Medical History:  Diagnosis Date  . Anxiety   . Arthritis    lower back   . Atrial flutter ([place]) 2020   Cards->lopressor and anticoag, ablation. In sinus still as of 08/2018 cards f/u.  Recurrence + PAF->requiring pt to get back on xarelto  . BPH with obstruction/lower urinary tract symptoms    started flomax via urol 03/2019  . CAD (coronary artery disease) 03/2018   Ostial first diagonal 60% narrowed, otherwise normal coronaries. Dr. Stanford Breed added ASA and statin 08/2018.  Marland Kitchen Depression   . Diabetes mellitus   . False positive stress test 03/2018   Cath showed normal coronaries with EF 50%  . GERD (gastroesophageal reflux disease)   . Hyperlipidemia   . Hypertension   . Hypogonadism, male 05/2015  . Increased prostate specific antigen (PSA) velocity 2019   06/2017 velocity up; recheck 10/2017 back to baseline.  Plan repeat 1 yr.  . Low back pain 2016   Left lumbar radiculopathy, lumbar spondylolisthesis.  Sciatica episode 03/2016.  Ortho getting L spine MRI as  of 07/2016.  . Microcytic anemia 12/2015   suspect iron def due to malabsorption.  Hemoccults NEG 01/26/16, ferrous sulfate started.  . Neuropathic pain of left foot   . NICM (nonischemic cardiomyopathy) ([place]) 05/2018   EF 40-45%, +LV hypokinesis. Suspected to be tachycardia-mediated->Dr. Stanford Breed to rpt echo fall 2020.  Marland Kitchen OSA on CPAP    cpap settings at 3  . Peyronie's disease 2021   Dr. Porfirio Oar (collegenase  intralesional injections)   Past Surgical History:  Procedure Laterality Date  . A-FLUTTER ABLATION N/A 07/09/2018   Procedure: A-FLUTTER ABLATION;  Surgeon: Evans Lance, MD;  Location: [facility];  Service: Cardiovascular;  Laterality: N/A;  . ADENOIDECTOMY    . BREATH TEK H PYLORI N/A 01/08/2013   Procedure: [place];  Surgeon: [name], MD;  Location: [facility];  Service: General;  Laterality: N/A;  . CARDIAC CATHETERIZATION  03/2018   No signi obstructive CAD (suspected FALSE POSITIVE STRESS TEST).  EF 50%.  Diastolic dysfunction.  Marland Kitchen CARDIOVASCULAR STRESS TEST  03/2018   Intermediate risk; EF 40%, medium sized defect with peri-infarct ischemia-->follow up cath showed no signif obstructive CAD.  Marland Kitchen COLONOSCOPY  04/17/2014   Benign polyp x 1.  Recall 10 yrs.  Marland Kitchen GASTRIC ROUX-EN-Y N/A 04/14/2013   Procedure: LAPAROSCOPIC ROUX-EN-Y GASTRIC BYPASS WITH UPPER ENDOSCOPY;  Surgeon: [name], MD;  Location: [facility];  Service: General;  Laterality: N/A;  . LEFT HEART CATH AND CORONARY ANGIOGRAPHY N/A 03/22/2018   NO CAD.  EF 50%. Procedure: LEFT HEART CATH AND CORONARY ANGIOGRAPHY;  Surgeon: Belva Crome, MD;  Location: [facility];  Service: Cardiovascular;  Laterality: N/A;  . Rhythm monitoring  01/08/2019   7 day monitor->Sinus rhythm with occasional PAC, PVC, paroxysmal atrial flutter, paroxysmal atrial fibrillation and 7 beats nonsustained ventricular tachycardia: xarelto restarted  . TONSILLECTOMY    . TRANSTHORACIC ECHOCARDIOGRAM  05/13/2018;12/03/18   05/2018 severe hypokinesis of LV inferior and inferolateral walls, EF 40-45%, correlates with stress test.  16/1096->EA 54-09%, diastolic fxn/LV filling pressure not determined due to pt in a fib/flutter    Current Outpatient Medications  Medication Sig Dispense Refill  . ALPRAZolam (XANAX) 0.5 MG tablet TAKE 1 TABLET BY MOUTH EVERY 8 HOURS AS NEEDED FOR SLEEP OR ANXIETY 90 tablet 5  . amLODipine  (NORVASC) 5 MG tablet Take 1 tablet (5 mg total) by mouth daily. 90 tablet 3  . aspirin EC 81 MG tablet Take 81 mg by mouth daily.    Marland Kitchen atorvastatin (LIPITOR) 40 MG tablet Take 1 tablet by mouth once daily 90 tablet 0  . benazepril (LOTENSIN) 40 MG tablet Take 1 tablet (40 mg total) by mouth daily. 90 tablet 3  . carvedilol (COREG) 25 MG tablet Take 1 tablet (25 mg total) by mouth 2 (two) times daily. 60 tablet 6  . dofetilide (TIKOSYN) 500 MCG capsule Take 1 capsule (500 mcg total) by mouth 2 (two) times daily. 180 capsule 2  . furosemide (LASIX) 40 MG tablet Take 1 tablet by mouth once daily 90 tablet 0  . glipiZIDE (GLUCOTROL XL) 10 MG 24 hr tablet Take 1 tablet by mouth once daily with breakfast 30 tablet 0  . Insulin Pen Needle 31G X 5 MM MISC Use with Victoza as directed 100 each 11  . Iron, Ferrous Sulfate, 325 (65 Fe) MG TABS TAKE 1 TABLET BY MOUTH ONCE DAILY WITH BREAKFAST    . liraglutide (VICTOZA) 18 MG/3ML SOPN INJECT 1.2MG   SUBCUTANEOUSLY ONCE DAILY 15 mL 6  . magnesium oxide (MAG-OX) 400 (241.3 Mg) MG tablet Take 1 tablet (400 mg total) by mouth 2 (two) times daily. 180 tablet 3  . metFORMIN (GLUCOPHAGE) 1000 MG tablet TAKE 1 TABLET BY MOUTH TWICE DAILY WITH A MEAL 180 tablet 3  . pregabalin (LYRICA) 150 MG capsule Take 1 capsule by mouth twice daily 60 capsule 5  . tamsulosin (FLOMAX) 0.4 MG CAPS capsule Take 0.4 mg by mouth daily in the afternoon.     Marland Kitchen testosterone cypionate (DEPOTESTOSTERONE CYPIONATE) 200 MG/ML injection 1 ml IM q 14 days 10 mL 0  . XARELTO 20 MG TABS tablet TAKE 1 TABLET BY MOUTH ONCE DAILY WITH SUPPER 90 tablet 0  . zaleplon (SONATA) 5 MG capsule TAKE 1 CAPSULE BY MOUTH AT BEDTIME AS NEEDED FOR SLEEP 90 capsule 1   No current facility-administered medications for this encounter.    No Known Allergies  Social History   Socioeconomic History  . Marital status: Divorced    Spouse name: Not on file  . Number of children: Not on file  . Years of  education: Not on file  . Highest education level: Not on file  Occupational History  . Occupation: Paramedic-EMT    Employer: [employer]  Tobacco Use  . Smoking status: Never Smoker  . Smokeless tobacco: Former Systems developer    Types: Chew  Substance and Sexual Activity  . Alcohol use: Yes    Alcohol/week: 18.0 standard drinks    Types: 18 Cans of beer per week    Comment: 18  . Drug use: No  . Sexual activity: Not on file  Other Topics Concern  . Not on file  Social History Narrative   Married, no children.   Occupation: paramedic   Social Determinants of Radio broadcast assistant Strain: Not on file  Food Insecurity: Not on file  Transportation Needs: Not on file  Physical Activity: Not on file  Stress: Not on file  Social Connections: Not on file  Intimate Partner Violence: Not on file    Family History  Problem Relation Age of Onset  . Heart disease Father        valvular disease  . COPD Father   . Prostate cancer Father   . Arrhythmia Father   . Colon cancer Neg Hx    The patient does not have a history of early familial atrial fibrillation or other arrhythmias.  ROS- All systems are reviewed and negative except as per the HPI above.  Physical Exam: Vitals:   03/09/20 0900  BP: 114/62  Pulse: 65  Weight: 130.9 kg  Height: 6\' 1"  (1.854 m)    GEN- The patient is a well appearing obese male, alert and oriented x 3 today.   HEENT-head normocephalic, atraumatic, sclera clear, conjunctiva pink, hearing intact, trachea midline. Lungs- Clear to ausculation bilaterally, normal work of breathing Heart- Regular rate and rhythm, no murmurs, rubs or gallops  GI- soft, NT, ND, + BS Extremities- no clubbing, cyanosis, or edema MS- no significant deformity or atrophy Skin- no rash or lesion Psych- euthymic mood, full affect Neuro- strength and sensation are intact   Wt Readings from Last 3 Encounters:  03/09/20 130.9 kg  02/04/20 133.3 kg  08/26/19  133.3 kg    EKG today demonstrates  SR, NST Vent. rate 65 BPM PR interval 172 ms QRS duration 118 ms QT/QTc 424/440 ms  Epic records are reviewed at length today  LHC 03/22/18  Elevated left ventricular end-diastolic pressure, 21 mmHg. EF 50%. Findings consistent with diastolic dysfunction.  Widely patent coronary arteries.  Normal left main  Normal LAD. Ostial first diagonal 60% narrowed.  Normal circumflex  Normal right coronary  False positive myocardial perfusion study related to diaphragm attenuation  Echo 12/03/18 1. Left ventricular ejection fraction, by visual estimation, is 40 to 45%. The left ventricle has mildly decreased function. There is mildly increased left ventricular hypertrophy.  2. Left ventricular diastolic function could not be evaluated.  3. Global right ventricle was not well visualized.The right ventricular size is not well visualized. Right vetricular wall thickness was not assessed.  4. Left atrial size was normal.  5. Right atrial size was moderately dilated.  6. Mild to moderate aortic valve annular calcification.  7. The mitral valve is normal in structure. No evidence of mitral valve regurgitation. No evidence of mitral stenosis.  8. The tricuspid valve is grossly normal. Tricuspid valve regurgitation is not demonstrated.  9. The aortic valve is normal in structure. Aortic valve regurgitation is not visualized. No evidence of aortic valve sclerosis or stenosis. 10. The pulmonic valve was normal in structure. Pulmonic valve regurgitation is not visualized. 11. The atrial septum is grossly normal.   Assessment and Plan:  1. Paroxysmal atrial fibrillation/Typical atrial flutter S/p atrial flutter ablation with Dr Lovena Le 07/09/18 S/p Tikosyn load. Patient appears to be maintaining SR. Continue dofetilide 500 mcg BID. QT stable. Check bmet/mag Continue Xarelto 20 mg daily.   Continue Coreg 25 mg BID Apple watch for monitoring  This  patients CHA2DS2-VASc Score and unadjusted Ischemic Stroke Rate (% per year) is equal to 4.8 % stroke rate/year from a score of 4  Above score calculated as 1 point each if present [CHF, HTN, DM, Vascular=MI/PAD/Aortic Plaque, Age if 65-74, or Male] Above score calculated as 2 points each if present [Age > 75, or Stroke/TIA/TE]  2. Obesity Body mass index is 38.08 kg/m. Lifestyle modification was discussed and encouraged including regular physical activity and weight reduction. Patient doing well losing weight with diet.  3. Obstructive sleep apnea Patient reports compliance with CPAP therapy. Followed by Dr Claiborne Billings.  4. HTN Stable, no changes today.  5. NICM EF 40-45% No signs or symptoms of fluid overload   Follow up with Dr Lovena Le and Dr Stanford Breed per recalls. AF clinic in one year.    [facility] [address] [place], [place] [address] [phone] 03/09/2020 9:09 AM

## 2020-03-09 ENCOUNTER — Other Ambulatory Visit: Payer: Self-pay

## 2020-03-09 ENCOUNTER — Ambulatory Visit (HOSPITAL_COMMUNITY)
Admission: RE | Admit: 2020-03-09 | Discharge: 2020-03-09 | Disposition: A | Discharge status: Home / Self Care | Payer: Commercial Managed Care - PPO | Source: Ambulatory Visit | Attending: Physician Assistant | Admitting: Physician Assistant

## 2020-03-09 ENCOUNTER — Encounter (HOSPITAL_COMMUNITY): Payer: Self-pay | Admitting: Physician Assistant

## 2020-03-09 VITALS — BP 114/62 | HR 65 | Ht 73.0 in | Wt 288.6 lb

## 2020-03-09 DIAGNOSIS — E669 Obesity, unspecified: Secondary | ICD-10-CM | POA: Insufficient documentation

## 2020-03-09 DIAGNOSIS — I480 Paroxysmal atrial fibrillation: Secondary | ICD-10-CM | POA: Diagnosis present

## 2020-03-09 DIAGNOSIS — Z8249 Family history of ischemic heart disease and other diseases of the circulatory system: Secondary | ICD-10-CM | POA: Insufficient documentation

## 2020-03-09 DIAGNOSIS — F17290 Nicotine dependence, other tobacco product, uncomplicated: Secondary | ICD-10-CM | POA: Diagnosis not present

## 2020-03-09 DIAGNOSIS — I10 Essential (primary) hypertension: Secondary | ICD-10-CM | POA: Insufficient documentation

## 2020-03-09 DIAGNOSIS — I483 Typical atrial flutter: Secondary | ICD-10-CM | POA: Insufficient documentation

## 2020-03-09 DIAGNOSIS — I428 Other cardiomyopathies: Secondary | ICD-10-CM | POA: Insufficient documentation

## 2020-03-09 DIAGNOSIS — Z6838 Body mass index (BMI) 38.0-38.9, adult: Secondary | ICD-10-CM | POA: Diagnosis not present

## 2020-03-09 DIAGNOSIS — D6869 Other thrombophilia: Secondary | ICD-10-CM

## 2020-03-09 DIAGNOSIS — Z7901 Long term (current) use of anticoagulants: Secondary | ICD-10-CM | POA: Diagnosis not present

## 2020-03-09 DIAGNOSIS — G4733 Obstructive sleep apnea (adult) (pediatric): Secondary | ICD-10-CM | POA: Diagnosis not present

## 2020-03-09 DIAGNOSIS — Z79899 Other long term (current) drug therapy: Secondary | ICD-10-CM | POA: Insufficient documentation

## 2020-03-09 DIAGNOSIS — Z713 Dietary counseling and surveillance: Secondary | ICD-10-CM | POA: Insufficient documentation

## 2020-03-09 LAB — BASIC METABOLIC PANEL
Anion gap: 10 (ref 5–15)
BUN: 17 mg/dL (ref 6–20)
CO2: 27 mmol/L (ref 22–32)
Calcium: 9.5 mg/dL (ref 8.9–10.3)
Chloride: 100 mmol/L (ref 98–111)
Creatinine, Ser: 0.94 mg/dL (ref 0.61–1.24)
GFR, Estimated: >60 mL/min (ref >60)
Glucose, Bld: 185 mg/dL — ABNORMAL HIGH (ref 70–99)
Potassium: 3.4 mmol/L — ABNORMAL LOW (ref 3.5–5.1)
Sodium: 137 mmol/L (ref 135–145)

## 2020-03-09 LAB — MAGNESIUM: Magnesium: 1.9 mg/dL (ref 1.7–2.4)

## 2020-05-21 ENCOUNTER — Other Ambulatory Visit (HOSPITAL_COMMUNITY): Payer: Self-pay | Admitting: Physician Assistant

## 2020-07-02 ENCOUNTER — Other Ambulatory Visit (HOSPITAL_COMMUNITY): Payer: Self-pay | Admitting: *Deleted

## 2020-07-02 ENCOUNTER — Other Ambulatory Visit: Payer: Self-pay | Admitting: Internal Medicine

## 2020-07-02 MED ORDER — DOFETILIDE 500 MCG PO CAPS: 500.0000 ug | ORAL_CAPSULE | Freq: Two times a day (BID) | ORAL | 2 refills | Status: DC

## 2020-07-23 IMAGING — DX DG CHEST 2V
2 series · 2 of 2 positions shown · non-contrast
Comparison: 01/22/2013

CLINICAL DATA: Chest pain, shortness of breath

EXAM:
CHEST - 2 VIEW

[chest pa]
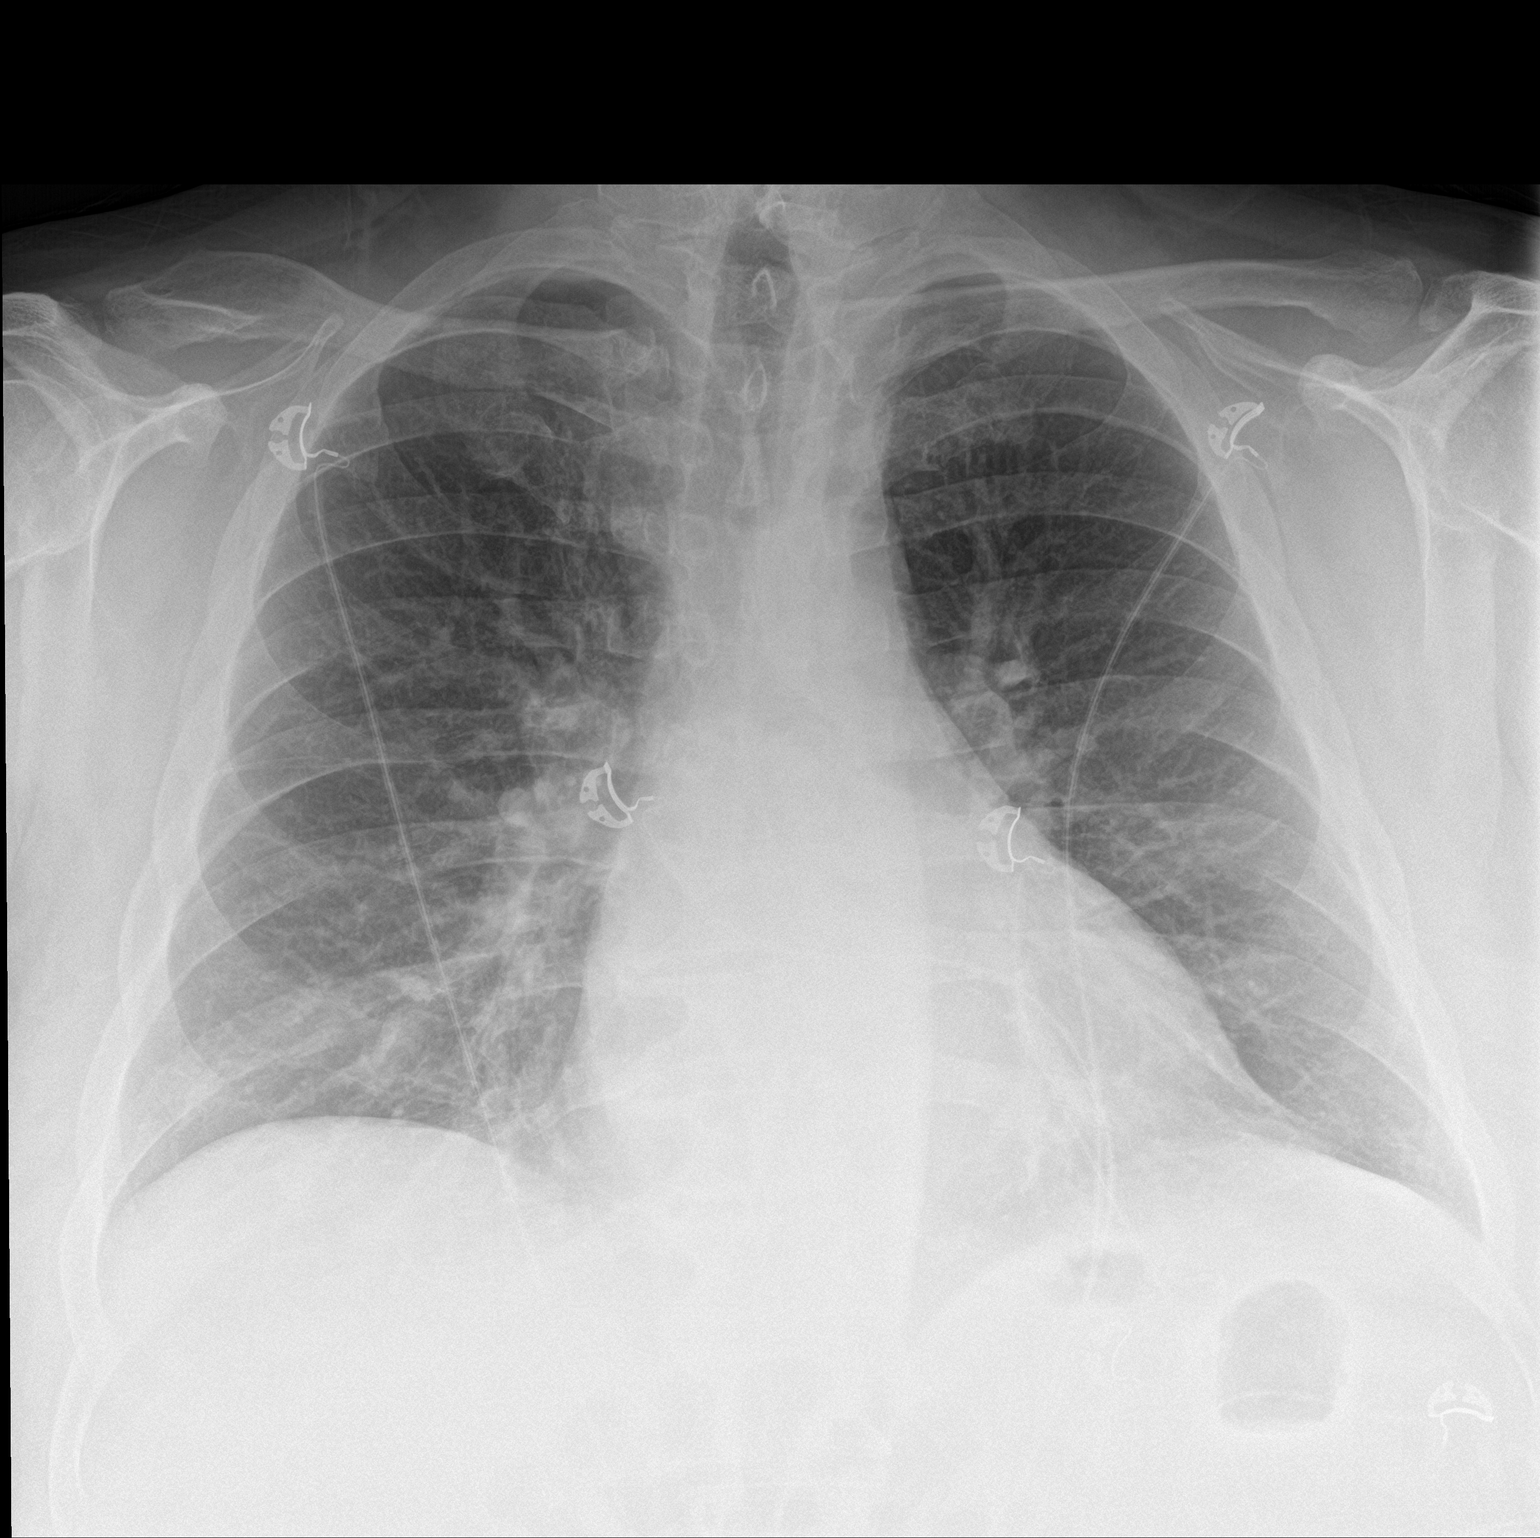

[chest lat]
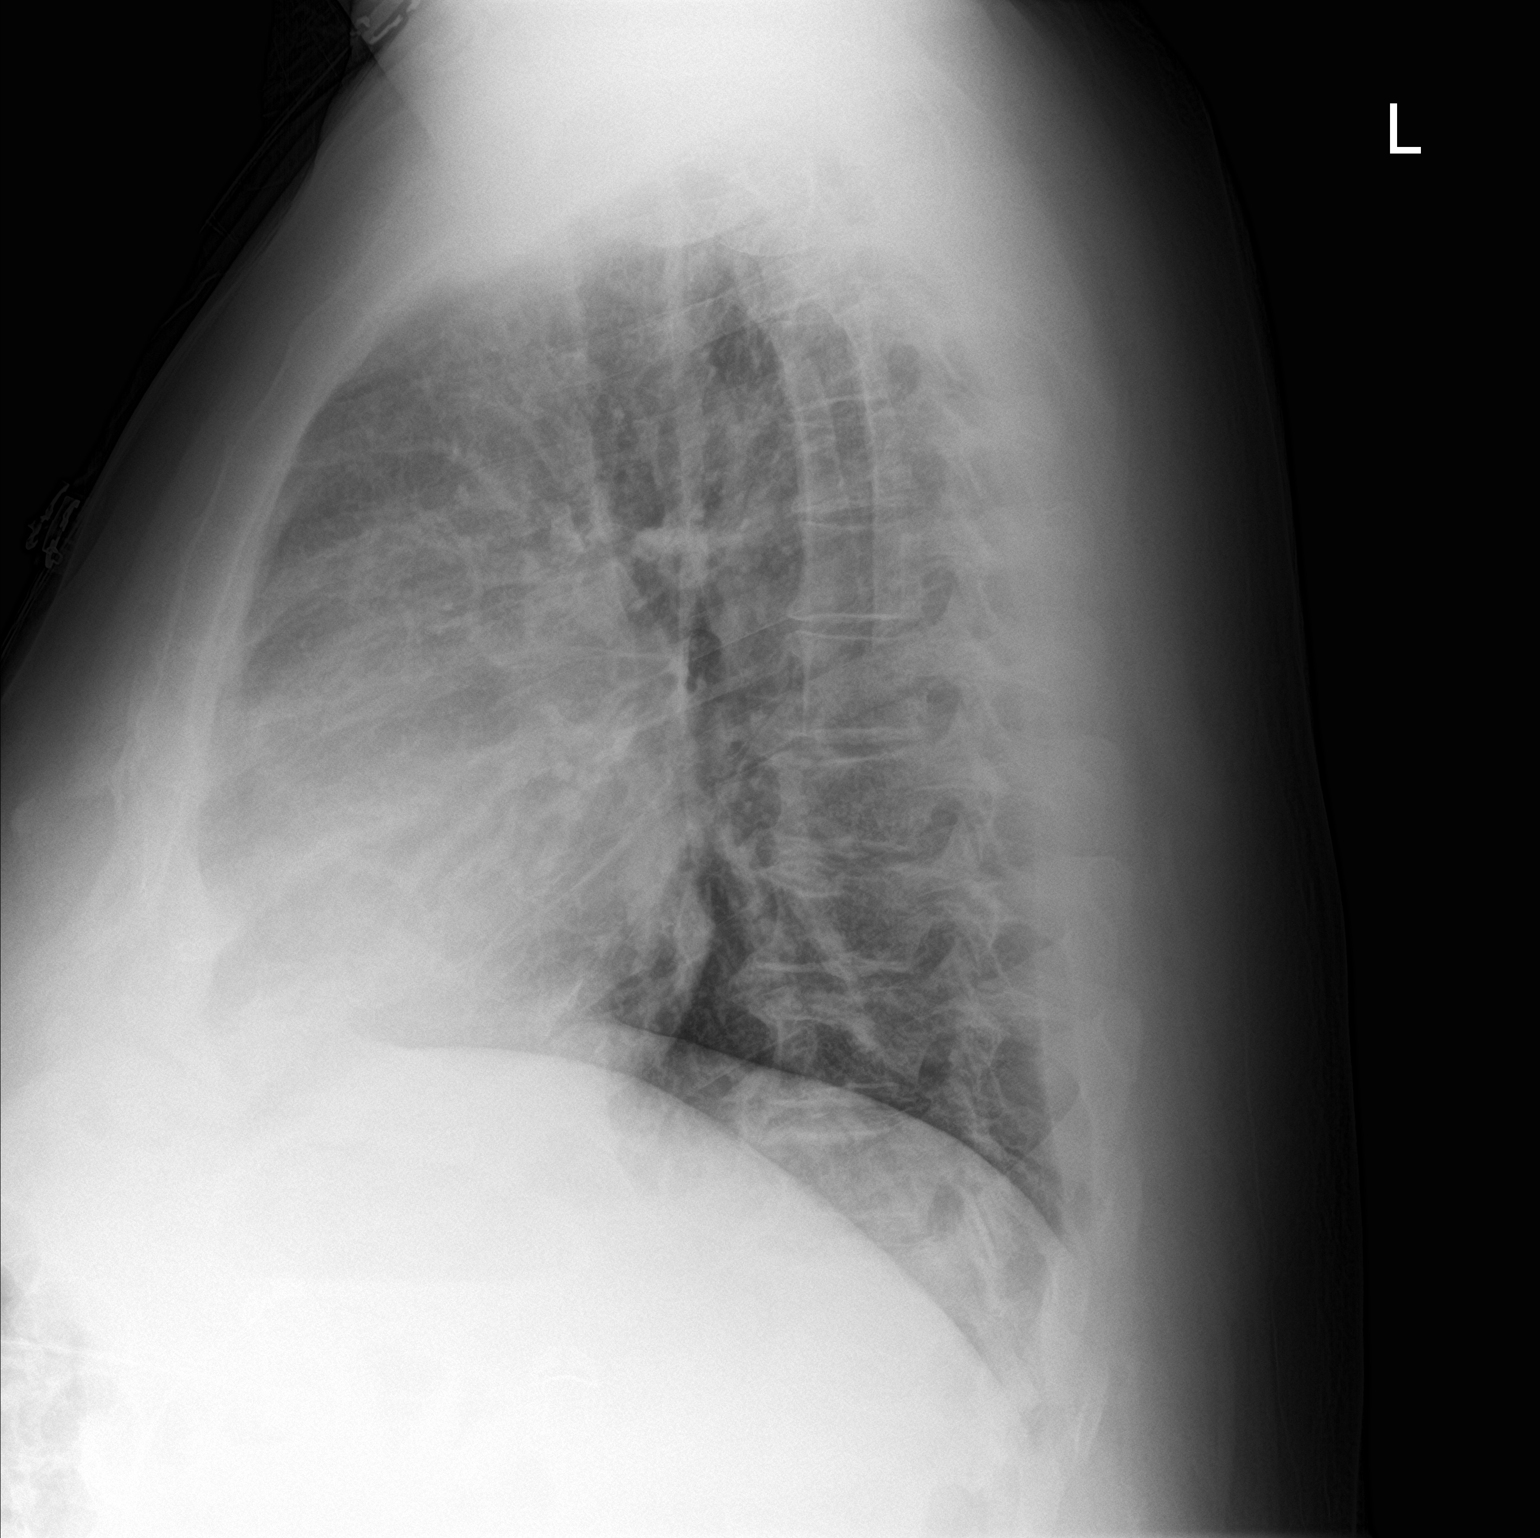

[2 of 2 positions shown; findings below may reference images not displayed]

FINDINGS: Lungs are clear.  No pleural effusion or pneumothorax.

The heart is normal in size.

Degenerative changes of the mid/lower thoracic spine.
IMPRESSION: Normal chest radiographs.

## 2020-08-22 ENCOUNTER — Other Ambulatory Visit: Payer: Self-pay | Admitting: Internal Medicine

## 2020-09-23 ENCOUNTER — Other Ambulatory Visit: Payer: Self-pay | Admitting: Internal Medicine

## 2020-09-27 ENCOUNTER — Other Ambulatory Visit (HOSPITAL_COMMUNITY): Payer: Self-pay | Admitting: *Deleted

## 2020-09-27 MED ORDER — MAGNESIUM OXIDE 400 MG PO CAPS: 400.0000 mg | ORAL_CAPSULE | Freq: Two times a day (BID) | ORAL | 2 refills | Status: AC

## 2020-10-07 ENCOUNTER — Other Ambulatory Visit: Payer: Self-pay

## 2020-10-07 ENCOUNTER — Other Ambulatory Visit: Payer: Self-pay | Admitting: Internal Medicine

## 2020-10-13 NOTE — Progress Notes (Signed)
HPI: Follow-up atrial fibrillation/flutter and cardiomyopathy.  Patient diagnosed with atrial flutter February 2020. LV function felt to be reduced on technically difficult echo 3/20. Cath 3/20 showed EF 50 60 D1 and otherwise normal coronaries. Had atrial flutter ablation 7/20.  Last echocardiogram December 2020 showed ejection fraction 40 to 45%, mild left ventricular hypertrophy, moderate right atrial enlargement.  Follow-up monitor January 2021 showed sinus with occasional PAC, PVC, paroxysmal atrial flutter, paroxysmal atrial fibrillation and 7 beats nonsustained ventricular tachycardia.  Patient ultimately placed on Tikosyn.  Patient seen at [facility] with atypical chest pain August 2022.  Troponins were normal.  Since last seen, he occasionally feels dyspneic which improves with extra Lasix.  No orthopnea or PND.  Minimal pedal edema.  No chest pain or syncope.  No palpitations.  No bleeding.  Current Outpatient Medications  Medication Sig Dispense Refill   ALPRAZolam (XANAX) 0.5 MG tablet TAKE 1 TABLET BY MOUTH EVERY 8 HOURS AS NEEDED FOR SLEEP OR ANXIETY 90 tablet 5   amLODipine (NORVASC) 5 MG tablet Take 1 tablet (5 mg total) by mouth daily. Please keep upcoming appt in January 2023 with Dr. Lovena Le before anymore refills. Thank you Final Attempt 90 tablet 0   aspirin EC 81 MG tablet Take 81 mg by mouth daily.     atorvastatin (LIPITOR) 40 MG tablet Take 1 tablet by mouth once daily 90 tablet 0   benazepril (LOTENSIN) 40 MG tablet Take 1 tablet (40 mg total) by mouth daily. Please keep upcoming appt in January 2023 with Dr. Lovena Le before anymore refills. Thank you Final Attempt 90 tablet 0   carvedilol (COREG) 25 MG tablet Take 1 tablet (25 mg total) by mouth 2 (two) times daily. 60 tablet 6   dofetilide (TIKOSYN) 500 MCG capsule Take 1 capsule (500 mcg total) by mouth 2 (two) times daily. 180 capsule 2   furosemide (LASIX) 40 MG tablet Take 1 tablet by mouth once daily 90 tablet 0    glipiZIDE (GLUCOTROL XL) 10 MG 24 hr tablet Take 1 tablet by mouth once daily with breakfast 30 tablet 0   Insulin Pen Needle 31G X 5 MM MISC Use with Victoza as directed 100 each 11   Iron, Ferrous Sulfate, 325 (65 Fe) MG TABS TAKE 1 TABLET BY MOUTH ONCE DAILY WITH BREAKFAST     liraglutide (VICTOZA) 18 MG/3ML SOPN INJECT 1.2MG  SUBCUTANEOUSLY ONCE DAILY 15 mL 6   Magnesium Oxide 400 MG CAPS Take 1 capsule (400 mg total) by mouth 2 (two) times daily. 180 capsule 2   metFORMIN (GLUCOPHAGE) 1000 MG tablet TAKE 1 TABLET BY MOUTH TWICE DAILY WITH A MEAL 180 tablet 3   pregabalin (LYRICA) 150 MG capsule Take 1 capsule by mouth twice daily 60 capsule 5   tamsulosin (FLOMAX) 0.4 MG CAPS capsule Take 0.4 mg by mouth daily in the afternoon.      testosterone cypionate (DEPOTESTOSTERONE CYPIONATE) 200 MG/ML injection 1 ml IM q 14 days 10 mL 0   XARELTO 20 MG TABS tablet TAKE 1 TABLET BY MOUTH ONCE DAILY WITH SUPPER 90 tablet 3   zaleplon (SONATA) 5 MG capsule TAKE 1 CAPSULE BY MOUTH AT BEDTIME AS NEEDED FOR SLEEP 90 capsule 1   No current facility-administered medications for this visit.     Past Medical History:  Diagnosis Date   Anxiety    Arthritis    lower back    Atrial flutter ([place]) 2020   Cards->lopressor and anticoag, ablation. In sinus  still as of 08/2018 cards f/u.  Recurrence + PAF->requiring pt to get back on xarelto   BPH with obstruction/lower urinary tract symptoms    started flomax via urol 03/2019   CAD (coronary artery disease) 03/2018   Ostial first diagonal 60% narrowed, otherwise normal coronaries. Dr. Stanford Breed added ASA and statin 08/2018.   Depression    Diabetes mellitus    False positive stress test 03/2018   Cath showed normal coronaries with EF 50%   GERD (gastroesophageal reflux disease)    Hyperlipidemia    Hypertension    Hypogonadism, male 05/2015   Increased prostate specific antigen (PSA) velocity 2019   06/2017 velocity up; recheck 10/2017 back to baseline.   Plan repeat 1 yr.   Low back pain 2016   Left lumbar radiculopathy, lumbar spondylolisthesis.  Sciatica episode 03/2016.  Ortho getting L spine MRI as of 07/2016.   Microcytic anemia 12/2015   suspect iron def due to malabsorption.  Hemoccults NEG 01/26/16, ferrous sulfate started.   Neuropathic pain of left foot    NICM (nonischemic cardiomyopathy) ([place]) 05/2018   EF 40-45%, +LV hypokinesis. Suspected to be tachycardia-mediated->Dr. Stanford Breed to rpt echo fall 2020.   OSA on CPAP    cpap settings at 3   Peyronie's disease 2021   Dr. Porfirio Oar (collegenase intralesional injections)    Past Surgical History:  Procedure Laterality Date   A-FLUTTER ABLATION N/A 07/09/2018   Procedure: A-FLUTTER ABLATION;  Surgeon: Evans Lance, MD;  Location: [facility];  Service: Cardiovascular;  Laterality: N/A;   ADENOIDECTOMY     BREATH TEK H PYLORI N/A 01/08/2013   Procedure: [place];  Surgeon: [name], MD;  Location: [facility];  Service: General;  Laterality: N/A;   CARDIAC CATHETERIZATION  03/2018   No signi obstructive CAD (suspected FALSE POSITIVE STRESS TEST).  EF 50%.  Diastolic dysfunction.   CARDIOVASCULAR STRESS TEST  03/2018   Intermediate risk; EF 40%, medium sized defect with peri-infarct ischemia-->follow up cath showed no signif obstructive CAD.   COLONOSCOPY  04/17/2014   Benign polyp x 1.  Recall 10 yrs.   GASTRIC ROUX-EN-Y N/A 04/14/2013   Procedure: LAPAROSCOPIC ROUX-EN-Y GASTRIC BYPASS WITH UPPER ENDOSCOPY;  Surgeon: [name], MD;  Location: [facility];  Service: General;  Laterality: N/A;   LEFT HEART CATH AND CORONARY ANGIOGRAPHY N/A 03/22/2018   NO CAD.  EF 50%. Procedure: LEFT HEART CATH AND CORONARY ANGIOGRAPHY;  Surgeon: Belva Crome, MD;  Location: [facility];  Service: Cardiovascular;  Laterality: N/A;   Rhythm monitoring  01/08/2019   7 day monitor->Sinus rhythm with occasional PAC, PVC, paroxysmal atrial flutter, paroxysmal  atrial fibrillation and 7 beats nonsustained ventricular tachycardia: xarelto restarted   TONSILLECTOMY     TRANSTHORACIC ECHOCARDIOGRAM  05/13/2018;12/03/18   05/2018 severe hypokinesis of LV inferior and inferolateral walls, EF 40-45%, correlates with stress test.  57/3220->UR 42-70%, diastolic fxn/LV filling pressure not determined due to pt in a fib/flutter    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EMT    Employer: [employer]  Tobacco Use   Smoking status: Never   Smokeless tobacco: Former    Types: Chew    Quit date: 02/06/2010  Substance  and Sexual Activity   Alcohol use: Yes    Alcohol/week: 18.0 standard drinks    Types: 18 Cans of beer per week    Comment: 18   Drug use: No   Sexual activity: Not on file  Other Topics Concern   Not on file  Social History Narrative   Married, no children.   Occupation: paramedic   Social Determinants of Radio broadcast assistant Strain: Not on file  Food Insecurity: Not on file  Transportation Needs: Not on file  Physical Activity: Not on file  Stress: Not on file  Social Connections: Not on file  Intimate Partner Violence: Not on file    Family History  Problem Relation Age of Onset   Heart disease Father        valvular disease   COPD Father    Prostate cancer Father    Arrhythmia Father    Colon cancer Neg Hx     ROS: no fevers or chills, productive cough, hemoptysis, dysphasia, odynophagia, melena, hematochezia, dysuria, hematuria, rash, seizure activity, orthopnea, PND, claudication. Remaining systems are negative.  Physical Exam: Well-developed obese in no acute distress.  Skin is warm and dry.  HEENT is normal.  Neck is supple.  Chest is clear to auscultation with normal expansion.   Cardiovascular exam is regular rate and rhythm.  Abdominal exam nontender or distended. No masses palpated. Extremities show trace edema. neuro grossly intact  ECG-normal sinus rhythm at a rate of 67, no ST changes.  Personally reviewed  A/P  1 paroxysmal atrial fibrillation/flutter-patient remains in sinus rhythm.  Continue carvedilol, Tikosyn and Xarelto.  2 nonischemic cardiomyopathy-continue ACE inhibitor and carvedilol.  Repeat echocardiogram.  He occasionally feels some dyspnea.  Add spironolactone 25 mg daily.  I have asked him to discuss with primary care the potential for adjusting his medications for diabetes mellitus to include [place]ard[place] or [place].  3 coronary artery disease-we will continue aspirin and statin.  He is not having chest pain.  4 hypertension-blood pressure elevated; add spironolactone 25 mg daily.  Check potassium and renal function in 1 week.  5 morbid obesity-we discussed the importance of diet, exercise and weight loss.  6 obstructive sleep apnea-followed by Dr. Claiborne Billings.  Continue CPAP.  Kirk Ruths, MD

## 2020-10-19 ENCOUNTER — Other Ambulatory Visit: Payer: Self-pay

## 2020-10-19 ENCOUNTER — Encounter: Payer: Self-pay | Admitting: Cardiology

## 2020-10-19 ENCOUNTER — Telehealth: Payer: Self-pay | Admitting: Cardiology

## 2020-10-19 ENCOUNTER — Ambulatory Visit (INDEPENDENT_AMBULATORY_CARE_PROVIDER_SITE_OTHER): Payer: Commercial Managed Care - PPO | Admitting: Cardiology

## 2020-10-19 VITALS — BP 143/78 | HR 67 | Ht 73.0 in | Wt 304.0 lb

## 2020-10-19 DIAGNOSIS — I428 Other cardiomyopathies: Secondary | ICD-10-CM

## 2020-10-19 DIAGNOSIS — E785 Hyperlipidemia, unspecified: Secondary | ICD-10-CM | POA: Diagnosis not present

## 2020-10-19 DIAGNOSIS — I2510 Atherosclerotic heart disease of native coronary artery without angina pectoris: Secondary | ICD-10-CM

## 2020-10-19 DIAGNOSIS — I10 Essential (primary) hypertension: Secondary | ICD-10-CM | POA: Diagnosis not present

## 2020-10-19 DIAGNOSIS — I480 Paroxysmal atrial fibrillation: Secondary | ICD-10-CM

## 2020-10-19 MED ORDER — SPIRONOLACTONE 25 MG PO TABS: 25.0000 mg | ORAL_TABLET | Freq: Every day | ORAL | 3 refills | Status: DC

## 2020-10-19 NOTE — Telephone Encounter (Signed)
*  STAT* If patient is at the pharmacy, call can be transferred to refill team.   1. Which medications need to be refilled? (please list name of each medication and dose if known)  Lasix 40 MG tablet (2x/day)  2. Which pharmacy/location (including street and city if local pharmacy) is medication to be sent to? [place], [place] [address]  3. Do they need a 30 day or 90 day supply?  90 day supply  Patient states he is completely out of medication.

## 2020-10-19 NOTE — Patient Instructions (Signed)
Medication Instructions:   START SPIRONOLACTONE 25 MG ONCE DAILY  *If you need a refill on your cardiac medications before your next appointment, please call your pharmacy*   Lab Work:  Your physician recommends that you return for lab work in: [place]  If you have labs (blood work) drawn today and your tests are completely normal, you will receive your results only by: [place] (if you have MyChart) OR A paper copy in the mail If you have any lab test that is abnormal or we need to change your treatment, we will call you to review the results.   Testing/Procedures:  Your physician has requested that you have an echocardiogram. Echocardiography is a painless test that uses sound waves to create images of your heart. It provides your doctor with information about the size and shape of your heart and how well your heart's chambers and valves are working. This procedure takes approximately one hour. There are no restrictions for this procedure. [place]   Follow-Up: At [facility], you and your health needs are our priority.  As part of our continuing mission to provide you with exceptional heart care, we have created designated Provider Care Teams.  These Care Teams include your primary Cardiologist (physician) and Advanced Practice Providers (APPs -  Physician Assistants and Nurse Practitioners) who all work together to provide you with the care you need, when you need it.  We recommend signing up for the patient portal called "MyChart".  Sign up information is provided on this After Visit Summary.  MyChart is used to connect with patients for Virtual Visits (Telemedicine).  Patients are able to view lab/test results, encounter notes, upcoming appointments, etc.  Non-urgent messages can be sent to your provider as well.   To learn more about what you can do with MyChart, go to [email or web address].    Your next appointment:   6 month(s)  The format for your  next appointment:   In Person  Provider:   Kirk Ruths, MD

## 2020-10-20 MED ORDER — FUROSEMIDE 40 MG PO TABS: 40.0000 mg | ORAL_TABLET | Freq: Every day | ORAL | 1 refills | Status: DC

## 2020-10-21 NOTE — Telephone Encounter (Signed)
Lasix is listed as 40 mg daily and patient is reporting taking Lasix 40 mg BID. Will clarify with Dr. Stanford Breed.

## 2020-11-11 ENCOUNTER — Other Ambulatory Visit: Payer: Self-pay

## 2020-11-11 ENCOUNTER — Ambulatory Visit (HOSPITAL_COMMUNITY): Payer: Commercial Managed Care - PPO | Attending: Cardiology

## 2020-11-11 DIAGNOSIS — I428 Other cardiomyopathies: Secondary | ICD-10-CM

## 2020-11-11 DIAGNOSIS — I480 Paroxysmal atrial fibrillation: Secondary | ICD-10-CM | POA: Diagnosis present

## 2020-11-11 LAB — BASIC METABOLIC PANEL
BUN/Creatinine Ratio: 22 — ABNORMAL HIGH (ref 9–20)
BUN: 31 mg/dL — ABNORMAL HIGH (ref 6–24)
CO2: 26 mmol/L (ref 20–29)
Calcium: 9.9 mg/dL (ref 8.7–10.2)
Chloride: 96 mmol/L (ref 96–106)
Creatinine, Ser: 1.41 mg/dL — ABNORMAL HIGH (ref 0.76–1.27)
Glucose: 229 mg/dL — ABNORMAL HIGH (ref 70–99)
Potassium: 5.2 mmol/L (ref 3.5–5.2)
Sodium: 135 mmol/L (ref 134–144)
eGFR: 58 mL/min/{1.73_m2} — ABNORMAL LOW (ref >59)

## 2020-11-11 LAB — ECHOCARDIOGRAM COMPLETE
Area-P 1/2: 3.5 cm2
S' Lateral: 3.8 cm

## 2020-11-15 ENCOUNTER — Other Ambulatory Visit: Payer: Self-pay | Admitting: *Deleted

## 2020-11-15 DIAGNOSIS — I428 Other cardiomyopathies: Secondary | ICD-10-CM

## 2020-11-17 ENCOUNTER — Encounter: Payer: Self-pay | Admitting: *Deleted

## 2020-12-16 ENCOUNTER — Ambulatory Visit (INDEPENDENT_AMBULATORY_CARE_PROVIDER_SITE_OTHER): Payer: Commercial Managed Care - PPO | Admitting: Cardiovascular Disease

## 2020-12-16 ENCOUNTER — Other Ambulatory Visit: Payer: Self-pay

## 2020-12-16 ENCOUNTER — Encounter: Payer: Self-pay | Admitting: Cardiovascular Disease

## 2020-12-16 DIAGNOSIS — I480 Paroxysmal atrial fibrillation: Secondary | ICD-10-CM

## 2020-12-16 DIAGNOSIS — I428 Other cardiomyopathies: Secondary | ICD-10-CM

## 2020-12-16 DIAGNOSIS — I10 Essential (primary) hypertension: Secondary | ICD-10-CM

## 2020-12-16 DIAGNOSIS — E119 Type 2 diabetes mellitus without complications: Secondary | ICD-10-CM

## 2020-12-16 DIAGNOSIS — G4733 Obstructive sleep apnea (adult) (pediatric): Secondary | ICD-10-CM | POA: Diagnosis not present

## 2020-12-16 DIAGNOSIS — Z794 Long term (current) use of insulin: Secondary | ICD-10-CM

## 2020-12-16 NOTE — Patient Instructions (Signed)

## 2020-12-16 NOTE — Progress Notes (Signed)
Cardiology Office Note    Date:  12/23/2020   ID:  Ethan Pitts, DOB 07-20-1960, MRN [id number]  PCP:  Ethan Booze, NP  Cardiologist:  Shelva Majestic, MD (sleep): Dr. Kirk Ruths  18 month sleep evaluation  History of Present Illness:  Ethan Pitts is a 60 y.o. male who is followed by Dr. Kirk Ruths with a history of atrial flutter and cardiomyopathy.  He has a history of obstructive sleep apnea and presents to establish care.  Mr. Rothermel initially underwent a sleep study in 2009.  On April 03, 2013 he was referred by Dr. Gwenette Pitts for a another sleep evaluation which was done at the [facility] sleep disorder center.  This revealed very severe sleep apnea with an AHI of 55.5/h and RDI of 63.2/h.  Oxygen nadir was 83%.  Apparently the patient saw Dr. Gwenette Pitts in July 2015 and by his report CPAP is working well but at times was using his old machine but had received a new machine.  Mr. Ethan Pitts has not seen a sleep physician since 2015.  [facility] was his DME company but he is not use them in many years.  He was unaware that Ethan Pitts had been bought out by [employer].  Over the years he has been buying his CPAP masks online.  He has a history of morbid obesity and remotely had undergone Roux-en-Y gastric bypass surgery with weight reduction from 335 down to 268 but subsequently weight has again increased up to 289.  Presently, he admits to daytime sleepiness I calculated a new Epworth Sleepiness Scale score which endorsed at 14 as shown below.  Epworth Sleepiness Scale: Situation   Chance of Dozing/Sleeping (0 = never , 1 = slight chance , 2 = moderate chance , 3 = high chance )   sitting and reading 3   watching TV 2   sitting inactive in a public place 2   being a passenger in a motor vehicle for an hour or more 2   lying down in the afternoon 3   sitting and talking to someone 1   sitting quietly after lunch (no alcohol) 2   while stopped  for a few minutes in traffic as the driver 0   Total Score  15     He has a history of paroxysmal atrial fibrillation and atrial flutter.  He has a history of type 2 diabetes mellitus, hypertension, as well as chronic anxiety with insomnia.  I saw him for initial sleep evaluation on May 30, 2019 he was referred for reestablishment of sleep care.  During his initial evaluation I had lengthy discussion with him and reviewed with him the adverse consequences of sleep apnea with reference to his cardiovascular health and the effects of untreated sleep apnea with reference to hypertension control, nocturnal arrhythmias which may have contributed to his history of atrial fibrillation and flutter as well as potential effects of nocturnal hypoxemia, glucose metabolism and GERD and inflammation.  Medication adjustment was made due to his low blood pressure.  Since I last saw him, he believes he is sleeping well with CPAP and "cannot sleep without it."  He typically works from 11 AM to 11 PM at [employer] 3 days/week.  He typically goes to bed at 1 AM but he gets up at 7 AM since he has to take his 22-year-old to school.  I obtained a download of his CPAP unit from November 15 through December 15, 2020.  He has been compliant standards with usage at 6 hours and 8 minutes.  His pressure range is set at 6 to 20 cm of water and his 95th percentile pressure is 14.3 with maximum average pressure 15.7.  AHI 0.6/h.  He presents for yearly evaluation.  Past Medical History:  Diagnosis Date   Anxiety    Arthritis    lower back    Atrial flutter ([place]) 2020   Cards->lopressor and anticoag, ablation. In sinus still as of 08/2018 cards f/u.  Recurrence + PAF->requiring pt to get back on xarelto   BPH with obstruction/lower urinary tract symptoms    started flomax via urol 03/2019   CAD (coronary artery disease) 03/2018   Ostial first diagonal 60% narrowed, otherwise normal coronaries. Dr. Stanford Pitts added ASA and statin  08/2018.   Depression    Diabetes mellitus    False positive stress test 03/2018   Cath showed normal coronaries with EF 50%   GERD (gastroesophageal reflux disease)    Hyperlipidemia    Hypertension    Hypogonadism, male 05/2015   Increased prostate specific antigen (PSA) velocity 2019   06/2017 velocity up; recheck 10/2017 back to baseline.  Plan repeat 1 yr.   Low back pain 2016   Left lumbar radiculopathy, lumbar spondylolisthesis.  Sciatica episode 03/2016.  Ortho getting L spine MRI as of 07/2016.   Microcytic anemia 12/2015   suspect iron def due to malabsorption.  Hemoccults NEG 01/26/16, ferrous sulfate started.   Neuropathic pain of left foot    NICM (nonischemic cardiomyopathy) ([place]) 05/2018   EF 40-45%, +LV hypokinesis. Suspected to be tachycardia-mediated->Dr. Stanford Pitts to rpt echo fall 2020.   OSA on CPAP    cpap settings at 3   Peyronie's disease 2021   Dr. Porfirio Oar (collegenase intralesional injections)    Past Surgical History:  Procedure Laterality Date   A-FLUTTER ABLATION N/A 07/09/2018   Procedure: A-FLUTTER ABLATION;  Surgeon: Evans Lance, MD;  Location: [facility];  Service: Cardiovascular;  Laterality: N/A;   ADENOIDECTOMY     BREATH TEK H PYLORI N/A 01/08/2013   Procedure: [place];  Surgeon: Ethan Earls, MD;  Location: [facility];  Service: General;  Laterality: N/A;   CARDIAC CATHETERIZATION  03/2018   No signi obstructive CAD (suspected FALSE POSITIVE STRESS TEST).  EF 50%.  Diastolic dysfunction.   CARDIOVASCULAR STRESS TEST  03/2018   Intermediate risk; EF 40%, medium sized defect with peri-infarct ischemia-->follow up cath showed no signif obstructive CAD.   COLONOSCOPY  04/17/2014   Benign polyp x 1.  Recall 10 yrs.   GASTRIC ROUX-EN-Y N/A 04/14/2013   Procedure: LAPAROSCOPIC ROUX-EN-Y GASTRIC BYPASS WITH UPPER ENDOSCOPY;  Surgeon: Ethan Earls, MD;  Location: [facility];  Service: General;  Laterality: N/A;   LEFT HEART  CATH AND CORONARY ANGIOGRAPHY N/A 03/22/2018   NO CAD.  EF 50%. Procedure: LEFT HEART CATH AND CORONARY ANGIOGRAPHY;  Surgeon: Belva Crome, MD;  Location: [facility];  Service: Cardiovascular;  Laterality: N/A;   Rhythm monitoring  01/08/2019   7 day monitor->Sinus rhythm with occasional PAC, PVC, paroxysmal atrial flutter, paroxysmal atrial fibrillation and 7 beats nonsustained ventricular tachycardia: xarelto restarted   TONSILLECTOMY     TRANSTHORACIC ECHOCARDIOGRAM  05/13/2018;12/03/18   05/2018 severe hypokinesis of LV inferior and inferolateral walls, EF 40-45%, correlates with stress test.  59/1638->GY 65-99%, diastolic fxn/LV filling pressure not determined due to pt in a fib/flutter  Current Medications: Outpatient Medications Prior to Visit  Medication Sig Dispense Refill   ALPRAZolam (XANAX) 0.5 MG tablet TAKE 1 TABLET BY MOUTH EVERY 8 HOURS AS NEEDED FOR SLEEP OR ANXIETY 90 tablet 5   amLODipine (NORVASC) 5 MG tablet Take 1 tablet (5 mg total) by mouth daily. Please keep upcoming appt in January 2023 with Dr. Lovena Le before anymore refills. Thank you Final Attempt 90 tablet 0   aspirin EC 81 MG tablet Take 81 mg by mouth daily.     atorvastatin (LIPITOR) 40 MG tablet Take 1 tablet by mouth once daily 90 tablet 0   benazepril (LOTENSIN) 40 MG tablet Take 1 tablet (40 mg total) by mouth daily. Please keep upcoming appt in January 2023 with Dr. Lovena Le before anymore refills. Thank you Final Attempt 90 tablet 0   carvedilol (COREG) 25 MG tablet Take 1 tablet (25 mg total) by mouth 2 (two) times daily. 60 tablet 6   dofetilide (TIKOSYN) 500 MCG capsule Take 1 capsule (500 mcg total) by mouth 2 (two) times daily. 180 capsule 2   furosemide (LASIX) 40 MG tablet Take 1 tablet (40 mg total) by mouth daily. 90 tablet 1   glipiZIDE (GLUCOTROL XL) 10 MG 24 hr tablet Take 1 tablet by mouth once daily with breakfast 30 tablet 0   Insulin Pen Needle 31G X 5 MM MISC Use with Victoza as  directed 100 each 11   JARDIANCE 25 MG TABS tablet Take 25 mg by mouth daily.     Magnesium Oxide 400 MG CAPS Take 1 capsule (400 mg total) by mouth 2 (two) times daily. 180 capsule 2   metFORMIN (GLUCOPHAGE) 1000 MG tablet TAKE 1 TABLET BY MOUTH TWICE DAILY WITH A MEAL 180 tablet 3   pregabalin (LYRICA) 150 MG capsule Take 1 capsule by mouth twice daily 60 capsule 5   spironolactone (ALDACTONE) 25 MG tablet Take 1 tablet (25 mg total) by mouth daily. 90 tablet 3   tamsulosin (FLOMAX) 0.4 MG CAPS capsule Take 0.4 mg by mouth daily in the afternoon.      testosterone cypionate (DEPOTESTOSTERONE CYPIONATE) 200 MG/ML injection 1 ml IM q 14 days 10 mL 0   XARELTO 20 MG TABS tablet TAKE 1 TABLET BY MOUTH ONCE DAILY WITH SUPPER 90 tablet 3   zaleplon (SONATA) 5 MG capsule TAKE 1 CAPSULE BY MOUTH AT BEDTIME AS NEEDED FOR SLEEP 90 capsule 1   Iron, Ferrous Sulfate, 325 (65 Fe) MG TABS TAKE 1 TABLET BY MOUTH ONCE DAILY WITH BREAKFAST (Patient not taking: Reported on 12/16/2020)     liraglutide (VICTOZA) 18 MG/3ML SOPN INJECT 1.2MG SUBCUTANEOUSLY ONCE DAILY (Patient not taking: Reported on 12/16/2020) 15 mL 6   No facility-administered medications prior to visit.     Allergies:   Patient has no known allergies.   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EMT    Employer: [employer]  Tobacco Use   Smoking status: Never   Smokeless tobacco: Former    Types: Chew    Quit date: 02/06/2010  Substance and Sexual Activity   Alcohol use: Yes    Alcohol/week: 18.0 standard drinks    Types: 18 Cans of beer per week    Comment: 18  Drug use: No   Sexual activity: Not on file  Other Topics Concern   Not on file  Social History Narrative   Married, no children.   Occupation:  paramedic   Social Determinants of Radio broadcast assistant Strain: Not on file  Food Insecurity: Not on file  Transportation Needs: Not on file  Physical Activity: Not on file  Stress: Not on file  Social Connections: Not on file    Socially he is divorced.  Adopted a 43-year-old.  He is retired from [employer].  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  Family History:  The patient's family history includes Arrhythmia in his father; COPD in his father; Heart disease in his father; Prostate cancer in his father.  His father died at age 70 with pneumonia.  Mother is 6 and living.  He has 1 brother age 17.  ROS General: Negative; No fevers, chills, or night sweats;  HEENT: Negative; No changes in vision or hearing, sinus congestion, difficulty swallowing Pulmonary: Negative; No cough, wheezing, shortness of breath, hemoptysis Cardiovascular: Negative; No chest pain, presyncope, syncope, palpitations GI: Negative; No nausea, vomiting, diarrhea, or abdominal pain GU: Negative; No dysuria, hematuria, or difficulty voiding Musculoskeletal: Negative; no myalgias, joint pain, or weakness Hematologic/Oncology: Negative; no easy bruising, bleeding Endocrine: Negative; no heat/cold intolerance; no diabetes Neuro: Negative; no changes in balance, headaches Skin: Negative; No rashes or skin lesions Psychiatric: Negative; No behavioral problems, depression Sleep: Negative; No snoring, daytime sleepiness, hypersomnolence, bruxism, restless legs, hypnogognic hallucinations, no cataplexy Other comprehensive 14 point system review is negative.   PHYSICAL EXAM:   VS:  BP 130/64    Pulse 70    Ht _0  (1.854 m)    Wt 300 lb (136.1 kg)    SpO2 96%    BMI 39.58 kg/m     Repeat blood pressure by me was 120/62  Wt Readings from Last 3 Encounters:  12/16/20 300 lb (136.1 kg)  10/19/20 (!) 304 lb (137.9 kg)  03/09/20 288 lb 9.6 oz (130.9 kg)     General: Alert, oriented,  no distress.  Skin: normal turgor, no rashes, warm and dry HEENT: Normocephalic, atraumatic.  Right eye erythema, nose without nasal septal hypertrophy Mouth/Parynx benign; Mallinpatti scale 3 Neck: No JVD, no carotid bruits; normal carotid upstroke Lungs: clear to ausculatation and percussion; no wheezing or rales Chest wall: without tenderness to palpitation Heart: PMI not displaced, RRR, s1 s2 normal, 1/6 systolic murmur, no diastolic murmur, no rubs, gallops, thrills, or heaves Abdomen: soft, nontender; no hepatosplenomehaly, BS+; abdominal aorta nontender and not dilated by palpation. Back: no CVA tenderness Pulses 2+ Musculoskeletal: full range of motion, normal strength, no joint deformities Extremities: no clubbing cyanosis or edema, Homan's sign negative  Neurologic: grossly nonfocal; Cranial nerves grossly wnl Psychologic: Normal mood and affect   Studies/Labs Reviewed:   December 16, 2020 ECG (independently read by me): Sinus rhythm at 70, PVC  May 30, 2019 ECG (independently read by me): Atrial flutter with 2:1 block; QTc 453  Recent Labs: BMP Latest Ref Rng & Units 11/11/2020 03/09/2020 01/31/2020  Glucose 70 - 99 mg/dL 229(H) 185(H) 117(H)  BUN 6 - 24 mg/dL 31(H) 17 15  Creatinine 0.76 - 1.27 mg/dL 1.41(H) 0.94 0.89  BUN/Creat Ratio 9 - 20 22(H) - -  Sodium 134 - 144 mmol/L 135 137 134(L)  Potassium 3.5 - 5.2 mmol/L 5.2 3.4(L) 3.9  Chloride 96 - 106 mmol/L 96 100 99  CO2 20 - 29 mmol/L _0 Calcium 8.7 - 10.2 mg/dL 9.9 9.5 9.5     Hepatic Function Latest Ref Rng & Units 01/31/2020 01/08/2019 02/03/2018  Total Protein 6.5 - 8.1 g/dL 7.4 7.0 6.7  Albumin 3.5 - 5.0 g/dL 4.2 4.2 3.6  AST 15 - 41 U/L _1 ALT 0 - 44 U/L _2 Alk Phosphatase 38 - 126 U/L 48 57 51  Total Bilirubin 0.3 - 1.2 mg/dL 0.5 0.6 0.6  Bilirubin, Direct 0.0 - 0.3 mg/dL - - -    CBC Latest Ref Rng & Units 01/31/2020 04/10/2019 04/08/2019  WBC 4.0 - 10.5 K/uL 5.1 4.8 5.9  Hemoglobin 13.0  - 17.0 g/dL 14.2 9.9(L) 10.4(L)  Hematocrit 39.0 - 52.0 % 39.7 33.1(L) 33.9(L)  Platelets 150 - 400 K/uL 160 181 228   Lab Results  Component Value Date   MCV 91.9 01/31/2020   MCV 86.2 04/10/2019   MCV 86.0 04/08/2019   Lab Results  Component Value Date   TSH 0.581 02/03/2018   Lab Results  Component Value Date   HGBA1C 8.3 (H) 01/08/2019     BNP    Component Value Date/Time   BNP 84.7 04/08/2019 2022    ProBNP No results found for: PROBNP   Lipid Panel     Component Value Date/Time   CHOL 90 01/08/2019 1005   TRIG 132.0 01/08/2019 1005   HDL 31.10 (L) 01/08/2019 1005   CHOLHDL 3 01/08/2019 1005   VLDL 26.4 01/08/2019 1005   LDLCALC 32 01/08/2019 1005   LDLCALC 39 02/23/2017 1636   LDLDIRECT 75.0 06/23/2016 0941     RADIOLOGY: No results found.   Additional studies/ records that were reviewed today include:  I have reviewed the [facility] polyp sonographic report from April 03, 2013.  I reviewed the July 02, 2013 record of Dr. Gwenette Pitts.  Recent records from Dr. Julien Nordmann were reviewed.  Epworth Sleepiness Scale score was calculated in the office today.  I obtain a new download from November 15 through December 15, 2020.  ASSESSMENT:    1. OSA (obstructive sleep apnea)   2. Paroxysmal atrial fibrillation (HCC)   3. Essential hypertension   4. Type 2 diabetes mellitus without complication, with long-term current use of insulin ([place])   5. NICM (nonischemic cardiomyopathy) ([place])   6. Morbid obesity [facility])     PLAN:  Mr. Julen Rubert is a 60 year old gentleman who has a history of morbid obesity and had undergone prior Roux-en-Y gastric bypass surgery.  He had significant weight reduction from 335 down to 268 but recently has gained weight back.  His BMI today is 39.6 with weight at 300 pounds.  I initially saw him, he told me his initial sleep study was in 2009 but he had not seen a sleep physician for over 6 years before being  reevaluated in 2015.  He subsequently had not been seen until I saw him in May 2021.   His initial evaluation with me I had a lengthy discussion with him guarding adverse consequences of sleep apnea with reference to his cardiovascular health.  He has a history of hypertension, diabetes mellitus, and cardiomyopathy.  He is using CPAP regularly and "cannot sleep without it."His most recent download, usage was only 6 hours and 8 minutes.  This most likely is related to the fact that he typically works from 11 AM to 11 PM 3 days/week.  He does not  get to bed until 1 AM and wakes up at 7 AM since he has to take his 29-year-old child to school.  He often is not "catching up" on his sleep on weekends.  His AHI is excellent and with his 95th percentile pressure at 14.3 I will change his minimum pressure from 6 up to 10 cm of water with maximum to 20.  I discussed with him optimal sleep duration at 7 and 9 hours if at all possible.  We also discussed the importance of weight loss and exercise.  Blood pressure today is controlled on amlodipine 5 mg, benazepril 40 mg, carvedilol 25 mg twice a day, furosemide 40 mg daily and spironolactone 25 mg.  He is diabetic on glipizide and Jardiance no longer taking Victoza.  With his history of PAF he continues to be on Xarelto for anticoagulation.  He will follow-up with cardiology care with Dr. Stanford Pitts.  I will see him in 1 year for follow-up sleep evaluation.  Medication Adjustments/Labs and Tests Ordered: Current medicines are reviewed at length with the patient today.  Concerns regarding medicines are outlined above.  Medication changes, Labs and Tests ordered today are listed in the Patient Instructions below. Patient Instructions  Medication Instructions:  Your physician recommends that you continue on your current medications as directed. Please refer to the Current Medication list given to you today.  *If you need a refill on your cardiac medications before your next  appointment, please call your pharmacy*   Follow-Up: At [facility], you and your health needs are our priority.  As part of our continuing mission to provide you with exceptional heart care, we have created designated Provider Care Teams.  These Care Teams include your primary Cardiologist (physician) and Advanced Practice Providers (APPs -  Physician Assistants and Nurse Practitioners) who all work together to provide you with the care you need, when you need it.  We recommend signing up for the patient portal called "MyChart".  Sign up information is provided on this After Visit Summary.  MyChart is used to connect with patients for Virtual Visits (Telemedicine).  Patients are able to view lab/test results, encounter notes, upcoming appointments, etc.  Non-urgent messages can be sent to your provider as well.   To learn more about what you can do with MyChart, go to [email or web address].    Your next appointment:   12 month(s)  The format for your next appointment:   In Person  Provider:   Shelva Majestic, MD  Other Instructions Sleep Clinic    Signed, Shelva Majestic, MD  12/23/2020 6:14 PM    [facility] [address] [place], [place], [place]  [address] Phone: [phone]

## 2020-12-23 ENCOUNTER — Encounter: Payer: Self-pay | Admitting: Cardiovascular Disease

## 2021-01-07 ENCOUNTER — Other Ambulatory Visit: Payer: Self-pay

## 2021-01-07 ENCOUNTER — Encounter: Payer: Self-pay | Admitting: Internal Medicine

## 2021-01-07 ENCOUNTER — Ambulatory Visit (INDEPENDENT_AMBULATORY_CARE_PROVIDER_SITE_OTHER): Payer: Managed Care, Other (non HMO) | Admitting: Internal Medicine

## 2021-01-07 VITALS — BP 112/66 | HR 75 | Ht 73.0 in | Wt 297.0 lb

## 2021-01-07 DIAGNOSIS — I4892 Unspecified atrial flutter: Secondary | ICD-10-CM

## 2021-01-07 DIAGNOSIS — I480 Paroxysmal atrial fibrillation: Secondary | ICD-10-CM | POA: Diagnosis not present

## 2021-01-07 DIAGNOSIS — I10 Essential (primary) hypertension: Secondary | ICD-10-CM | POA: Diagnosis not present

## 2021-01-07 LAB — BASIC METABOLIC PANEL
BUN/Creatinine Ratio: 17 (ref 9–20)
BUN: 21 mg/dL (ref 6–24)
CO2: 24 mmol/L (ref 20–29)
Calcium: 9.9 mg/dL (ref 8.7–10.2)
Chloride: 94 mmol/L — ABNORMAL LOW (ref 96–106)
Creatinine, Ser: 1.23 mg/dL (ref 0.76–1.27)
Glucose: 270 mg/dL — ABNORMAL HIGH (ref 70–99)
Potassium: 5 mmol/L (ref 3.5–5.2)
Sodium: 132 mmol/L — ABNORMAL LOW (ref 134–144)
eGFR: 68 mL/min/{1.73_m2} (ref >59)

## 2021-01-07 NOTE — Progress Notes (Signed)
HPI Mr. Danielsen returns today for followup of PAF. He is a pleasant 61 yo man with morbid obesity, s/p bariatric surgery, who has atial fib. He was hospitalized several weeks ago with initiation of dofetilide. He notes some peripheral edema but admits to dietary indiscretion.He has occaisional palpitations but feels better and has more energy since he has been on dofetilide for atrial fib.  No Known Allergies   Current Outpatient Medications  Medication Sig Dispense Refill   ALPRAZolam (XANAX) 0.5 MG tablet TAKE 1 TABLET BY MOUTH EVERY 8 HOURS AS NEEDED FOR SLEEP OR ANXIETY 90 tablet 5   amLODipine (NORVASC) 5 MG tablet Take 1 tablet (5 mg total) by mouth daily. Please keep upcoming appt in January 2023 with Dr. Lovena Le before anymore refills. Thank you Final Attempt 90 tablet 0   aspirin EC 81 MG tablet Take 81 mg by mouth daily.     atorvastatin (LIPITOR) 40 MG tablet Take 1 tablet by mouth once daily 90 tablet 0   benazepril (LOTENSIN) 40 MG tablet Take 1 tablet (40 mg total) by mouth daily. Please keep upcoming appt in January 2023 with Dr. Lovena Le before anymore refills. Thank you Final Attempt 90 tablet 0   dofetilide (TIKOSYN) 500 MCG capsule Take 1 capsule (500 mcg total) by mouth 2 (two) times daily. 180 capsule 2   furosemide (LASIX) 40 MG tablet Take 1 tablet (40 mg total) by mouth daily. 90 tablet 1   glipiZIDE (GLUCOTROL XL) 10 MG 24 hr tablet Take 1 tablet by mouth once daily with breakfast 30 tablet 0   Insulin Pen Needle 31G X 5 MM MISC Use with Victoza as directed 100 each 11   Iron, Ferrous Sulfate, 325 (65 Fe) MG TABS      JARDIANCE 25 MG TABS tablet Take 25 mg by mouth daily.     liraglutide (VICTOZA) 18 MG/3ML SOPN INJECT 1.2MG  SUBCUTANEOUSLY ONCE DAILY 15 mL 6   Magnesium Oxide 400 MG CAPS Take 1 capsule (400 mg total) by mouth 2 (two) times daily. 180 capsule 2   metFORMIN (GLUCOPHAGE) 1000 MG tablet TAKE 1 TABLET BY MOUTH TWICE DAILY WITH A MEAL 180 tablet 3    pregabalin (LYRICA) 150 MG capsule Take 1 capsule by mouth twice daily 60 capsule 5   spironolactone (ALDACTONE) 25 MG tablet Take 1 tablet (25 mg total) by mouth daily. 90 tablet 3   tamsulosin (FLOMAX) 0.4 MG CAPS capsule Take 0.4 mg by mouth daily in the afternoon.      testosterone cypionate (DEPOTESTOSTERONE CYPIONATE) 200 MG/ML injection 1 ml IM q 14 days 10 mL 0   XARELTO 20 MG TABS tablet TAKE 1 TABLET BY MOUTH ONCE DAILY WITH SUPPER 90 tablet 3   zaleplon (SONATA) 5 MG capsule TAKE 1 CAPSULE BY MOUTH AT BEDTIME AS NEEDED FOR SLEEP 90 capsule 1   carvedilol (COREG) 25 MG tablet Take 1 tablet (25 mg total) by mouth 2 (two) times daily. 60 tablet 6   No current facility-administered medications for this visit.     Past Medical History:  Diagnosis Date   Anxiety    Arthritis    lower back    Atrial flutter ([place]) 2020   Cards->lopressor and anticoag, ablation. In sinus still as of 08/2018 cards f/u.  Recurrence + PAF->requiring pt to get back on xarelto   BPH with obstruction/lower urinary tract symptoms    started flomax via urol 03/2019   CAD (coronary artery disease) 03/2018  Ostial first diagonal 60% narrowed, otherwise normal coronaries. Dr. Stanford Breed added ASA and statin 08/2018.   Depression    Diabetes mellitus    False positive stress test 03/2018   Cath showed normal coronaries with EF 50%   GERD (gastroesophageal reflux disease)    Hyperlipidemia    Hypertension    Hypogonadism, male 05/2015   Increased prostate specific antigen (PSA) velocity 2019   06/2017 velocity up; recheck 10/2017 back to baseline.  Plan repeat 1 yr.   Low back pain 2016   Left lumbar radiculopathy, lumbar spondylolisthesis.  Sciatica episode 03/2016.  Ortho getting L spine MRI as of 07/2016.   Microcytic anemia 12/2015   suspect iron def due to malabsorption.  Hemoccults NEG 01/26/16, ferrous sulfate started.   Neuropathic pain of left foot    NICM (nonischemic cardiomyopathy) ([place]) 05/2018   EF  40-45%, +LV hypokinesis. Suspected to be tachycardia-mediated->Dr. Stanford Breed to rpt echo fall 2020.   OSA on CPAP    cpap settings at 3   Peyronie's disease 2021   Dr. Porfirio Oar (collegenase intralesional injections)    ROS:   All systems reviewed and negative except as noted in the HPI.   Past Surgical History:  Procedure Laterality Date   A-FLUTTER ABLATION N/A 07/09/2018   Procedure: A-FLUTTER ABLATION;  Surgeon: Evans Lance, MD;  Location: [facility];  Service: Cardiovascular;  Laterality: N/A;   ADENOIDECTOMY     BREATH TEK H PYLORI N/A 01/08/2013   Procedure: [place];  Surgeon: [name], MD;  Location: [facility];  Service: General;  Laterality: N/A;   CARDIAC CATHETERIZATION  03/2018   No signi obstructive CAD (suspected FALSE POSITIVE STRESS TEST).  EF 50%.  Diastolic dysfunction.   CARDIOVASCULAR STRESS TEST  03/2018   Intermediate risk; EF 40%, medium sized defect with peri-infarct ischemia-->follow up cath showed no signif obstructive CAD.   COLONOSCOPY  04/17/2014   Benign polyp x 1.  Recall 10 yrs.   GASTRIC ROUX-EN-Y N/A 04/14/2013   Procedure: LAPAROSCOPIC ROUX-EN-Y GASTRIC BYPASS WITH UPPER ENDOSCOPY;  Surgeon: [name], MD;  Location: [facility];  Service: General;  Laterality: N/A;   LEFT HEART CATH AND CORONARY ANGIOGRAPHY N/A 03/22/2018   NO CAD.  EF 50%. Procedure: LEFT HEART CATH AND CORONARY ANGIOGRAPHY;  Surgeon: Belva Crome, MD;  Location: [facility];  Service: Cardiovascular;  Laterality: N/A;   Rhythm monitoring  01/08/2019   7 day monitor->Sinus rhythm with occasional PAC, PVC, paroxysmal atrial flutter, paroxysmal atrial fibrillation and 7 beats nonsustained ventricular tachycardia: xarelto restarted   TONSILLECTOMY     TRANSTHORACIC ECHOCARDIOGRAM  05/13/2018;12/03/18   05/2018 severe hypokinesis of LV inferior and inferolateral walls, EF 40-45%, correlates with stress test.  28/4132->GM 01-02%, diastolic  fxn/LV filling pressure not determined due to pt in a fib/flutter     Family History  Problem Relation Age of Onset   Heart disease Father        valvular disease   COPD Father    Prostate cancer Father    Arrhythmia Father    Colon cancer Neg Hx      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EMT    Employer: [employer]  Tobacco Use   Smoking status: Never   Smokeless tobacco: Former    Types: Chew    Quit date: 02/06/2010  Substance and Sexual Activity   Alcohol use: Yes    Alcohol/week: 18.0 standard drinks    Types: 18 Cans of beer per week    Comment: 18   Drug use: No   Sexual activity: Not on file  Other Topics Concern   Not on file  Social History Narrative   Married, no children.   Occupation: paramedic   Social Determinants of Radio broadcast assistant Strain: Not on file  Food Insecurity: Not on file  Transportation Needs: Not on file  Physical Activity: Not on file  Stress: Not on file  Social Connections: Not on file  Intimate Partner Violence: Not on file     BP 112/66    Pulse 75    Ht 6\' 1"  (1.854 m)    Wt 297 lb (134.7 kg)    SpO2 96%    BMI 39.18 kg/m   Physical Exam:  Well appearing NAD HEENT: Unremarkable Neck:  No JVD, no thyromegally Lymphatics:  No adenopathy Back:  No CVA tenderness Lungs:  Clear with no wheezes HEART:  Regular rate rhythm, no murmurs, no rubs, no clicks Abd:  soft, positive bowel sounds, no organomegally, no rebound, no guarding Ext:  2 plus pulses, no edema, no cyanosis, no clubbing Skin:  No rashes no nodules Neuro:  CN II through XII intact, motor grossly intact  EKG - nsr  DEVICE  Normal device function.  See PaceArt for details.   Assess/Plan:  PAF - he is maintaining NSR on  dofetilide Obesity - his weight is increased since his last visit and I have encouraged him to work on losing weight. HTN - his bp is controlled. No change in medical therapy. Atrial flutter - he is maintaining NSR since his flutter ablation.  Carleene Overlie Juanice Warburton,MD

## 2021-01-07 NOTE — Patient Instructions (Signed)

## 2021-02-10 ENCOUNTER — Other Ambulatory Visit: Payer: Self-pay | Admitting: Family Medicine

## 2021-02-17 ENCOUNTER — Encounter: Payer: Self-pay | Admitting: Cardiology

## 2021-03-15 ENCOUNTER — Other Ambulatory Visit: Payer: Self-pay | Admitting: Internal Medicine

## 2021-03-15 ENCOUNTER — Other Ambulatory Visit (HOSPITAL_COMMUNITY): Payer: Self-pay | Admitting: Physician Assistant

## 2021-06-06 ENCOUNTER — Telehealth (HOSPITAL_COMMUNITY): Payer: Self-pay

## 2021-06-06 MED ORDER — RIVAROXABAN 20 MG PO TABS: 20.0000 mg | ORAL_TABLET | Freq: Every day | ORAL | 0 refills | Status: DC

## 2021-06-06 NOTE — Telephone Encounter (Signed)
Patient called regarding refills on his Xarelto medication. Notified patient I sent in his medication- 90 day supply to [facility] in [place] and scheduled him for a follow up appointment on July 6th @ 1:30pm with Tilda Franco. Patient given the parking code for July. Patient verbalized understanding.

## 2021-06-09 ENCOUNTER — Other Ambulatory Visit (HOSPITAL_COMMUNITY): Payer: Self-pay | Admitting: Physician Assistant

## 2021-07-07 ENCOUNTER — Ambulatory Visit (HOSPITAL_COMMUNITY)
Admission: RE | Admit: 2021-07-07 | Discharge: 2021-07-07 | Disposition: A | Discharge status: Home / Self Care | Payer: Commercial Managed Care - PPO | Source: Ambulatory Visit | Attending: Physician Assistant | Admitting: Physician Assistant

## 2021-07-07 ENCOUNTER — Encounter (HOSPITAL_COMMUNITY): Payer: Self-pay | Admitting: Physician Assistant

## 2021-07-07 VITALS — BP 134/78 | HR 76 | Ht 73.0 in | Wt 285.8 lb

## 2021-07-07 DIAGNOSIS — E118 Type 2 diabetes mellitus with unspecified complications: Secondary | ICD-10-CM | POA: Diagnosis not present

## 2021-07-07 DIAGNOSIS — D6869 Other thrombophilia: Secondary | ICD-10-CM | POA: Diagnosis not present

## 2021-07-07 DIAGNOSIS — G4733 Obstructive sleep apnea (adult) (pediatric): Secondary | ICD-10-CM | POA: Diagnosis not present

## 2021-07-07 DIAGNOSIS — I480 Paroxysmal atrial fibrillation: Secondary | ICD-10-CM | POA: Diagnosis present

## 2021-07-07 DIAGNOSIS — Z79899 Other long term (current) drug therapy: Secondary | ICD-10-CM | POA: Insufficient documentation

## 2021-07-07 DIAGNOSIS — I10 Essential (primary) hypertension: Secondary | ICD-10-CM | POA: Insufficient documentation

## 2021-07-07 DIAGNOSIS — Z7984 Long term (current) use of oral hypoglycemic drugs: Secondary | ICD-10-CM | POA: Diagnosis not present

## 2021-07-07 DIAGNOSIS — Z794 Long term (current) use of insulin: Secondary | ICD-10-CM | POA: Diagnosis not present

## 2021-07-07 DIAGNOSIS — Z6837 Body mass index (BMI) 37.0-37.9, adult: Secondary | ICD-10-CM | POA: Insufficient documentation

## 2021-07-07 DIAGNOSIS — Z7901 Long term (current) use of anticoagulants: Secondary | ICD-10-CM | POA: Insufficient documentation

## 2021-07-07 DIAGNOSIS — E669 Obesity, unspecified: Secondary | ICD-10-CM | POA: Insufficient documentation

## 2021-07-07 DIAGNOSIS — I428 Other cardiomyopathies: Secondary | ICD-10-CM | POA: Diagnosis not present

## 2021-07-07 DIAGNOSIS — I483 Typical atrial flutter: Secondary | ICD-10-CM | POA: Diagnosis not present

## 2021-07-07 NOTE — Progress Notes (Signed)
Primary Care Physician: Jettie Booze, NP  Referring Physician: [facility] Primary Cardiologist: Dr Stanford Breed Primary EP: Dr Cleda Mccreedy is a 61 y.o. male with a history of atrial flutter, OSA, HTN, DM who presents for follow up in the [facility].  The patient was initially diagnosed with atrial flutter 02/03/18 when his Apple Watch showed that he had an irregular rhythm. 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 He had no awareness of his arrhythmia. Patient is s/p atrial flutter ablation 07/09/18 with Dr Lovena Le. Zio patch 12/2018 showed 35% afib burden with rapid rates at times. He was started back on Xarelto for a CHADS2VASC score of 4 by Dr Stanford Breed. Patient was seen at the ER 04/08/19 for hypotension. Patient states he was inadvertently taking metoprolol and carvedilol together. He was also found to be in afib but converted to SR after fluid bolus. Since stopping both carvedilol and metoprolol, he has noted some heart rates up to 110s. Prior to this, he states his afib has been well controlled with few episodes on his Apple Watch and always rate controlled. On follow up with Kerin Ransom 04/17/19, patient was in rate controlled atrial flutter but with several days of elevated heart rates on his Apple Watch.   On follow up today, patient has done well since his last visit. His smart watch has shown no afib. No significant bleeding issues on anticoagulation.   Today, he denies symptoms of palpitations, chest pain, SOB, orthopnea, PND, lower extremity edema, dizziness, presyncope, syncope, bleeding, or neurologic sequela. The patient is tolerating medications without difficulties and is otherwise without complaint today.    Atrial Fibrillation Risk Factors:  he does have symptoms or diagnosis of sleep apnea. he is compliant with CPAP therapy. he does not  have a history of rheumatic fever. he does not have a history of significant alcohol use.  he has a BMI of Body mass index is 37.71 kg/m.Marland Kitchen Filed Weights   07/07/21 1343  Weight: 129.6 kg      Atrial Fibrillation Management history:  Previous antiarrhythmic drugs: dofetilide  Previous cardioversions: none Previous ablations: 07/09/18 flutter CHADS2VASC score: 4 (DM, HTN, CAD, CM) Anticoagulation history: Xarelto   Past Medical History:  Diagnosis Date   Anxiety    Arthritis    lower back    Atrial flutter ([place]) 2020   Cards->lopressor and anticoag, ablation. In sinus still as of 08/2018 cards f/u.  Recurrence + PAF->requiring pt to get back on xarelto   BPH with obstruction/lower urinary tract symptoms    started flomax via urol 03/2019   CAD (coronary artery disease) 03/2018   Ostial first diagonal 60% narrowed, otherwise normal coronaries. Dr. Stanford Breed added ASA and statin 08/2018.   Depression    Diabetes mellitus    False positive stress test 03/2018   Cath showed normal coronaries with EF 50%   GERD (gastroesophageal reflux disease)    Hyperlipidemia    Hypertension    Hypogonadism, male 05/2015   Increased prostate specific antigen (PSA) velocity 2019   06/2017 velocity up; recheck 10/2017 back to baseline.  Plan repeat 1 yr.   Low back pain 2016   Left lumbar radiculopathy, lumbar spondylolisthesis.  Sciatica episode 03/2016.  Ortho getting L spine MRI as of 07/2016.   Microcytic anemia 12/2015   suspect iron def due to malabsorption.  Hemoccults NEG 01/26/16, ferrous sulfate started.   Neuropathic pain of left foot    NICM (nonischemic cardiomyopathy) ([place]) 05/2018   EF 40-45%, +LV hypokinesis. Suspected to be tachycardia-mediated->Dr. Stanford Breed to rpt echo fall 2020.   OSA on CPAP    cpap settings at 3   Peyronie's disease 2021   Dr. Porfirio Oar (collegenase intralesional injections)   Past Surgical History:  Procedure Laterality Date   A-FLUTTER ABLATION N/A  07/09/2018   Procedure: A-FLUTTER ABLATION;  Surgeon: Evans Lance, MD;  Location: [facility];  Service: Cardiovascular;  Laterality: N/A;   ADENOIDECTOMY     BREATH TEK H PYLORI N/A 01/08/2013   Procedure: [place];  Surgeon: [name], MD;  Location: [facility];  Service: General;  Laterality: N/A;   CARDIAC CATHETERIZATION  03/2018   No signi obstructive CAD (suspected FALSE POSITIVE STRESS TEST).  EF 50%.  Diastolic dysfunction.   CARDIOVASCULAR STRESS TEST  03/2018   Intermediate risk; EF 40%, medium sized defect with peri-infarct ischemia-->follow up cath showed no signif obstructive CAD.   COLONOSCOPY  04/17/2014   Benign polyp x 1.  Recall 10 yrs.   GASTRIC ROUX-EN-Y N/A 04/14/2013   Procedure: LAPAROSCOPIC ROUX-EN-Y GASTRIC BYPASS WITH UPPER ENDOSCOPY;  Surgeon: [name], MD;  Location: [facility];  Service: General;  Laterality: N/A;   LEFT HEART CATH AND CORONARY ANGIOGRAPHY N/A 03/22/2018   NO CAD.  EF 50%. Procedure: LEFT HEART CATH AND CORONARY ANGIOGRAPHY;  Surgeon: Belva Crome, MD;  Location: [facility];  Service: Cardiovascular;  Laterality: N/A;   Rhythm monitoring  01/08/2019   7 day monitor->Sinus rhythm with occasional PAC, PVC, paroxysmal atrial flutter, paroxysmal atrial fibrillation and 7 beats nonsustained ventricular tachycardia: xarelto restarted   TONSILLECTOMY     TRANSTHORACIC ECHOCARDIOGRAM  05/13/2018;12/03/18   05/2018 severe hypokinesis of LV inferior and inferolateral walls, EF 40-45%, correlates with stress test.  24/0973->ZH 29-92%, diastolic fxn/LV filling pressure not determined due to pt in a fib/flutter    Current Outpatient Medications  Medication Sig Dispense Refill   ALPRAZolam (XANAX) 0.5 MG tablet TAKE 1 TABLET BY MOUTH EVERY 8 HOURS AS NEEDED FOR SLEEP OR ANXIETY 90 tablet 5   amLODipine (NORVASC) 5 MG tablet TAKE 1 TABLET BY MOUTH ONCE DAILY . APPOINTMENT REQUIRED FOR FUTURE REFILLS 90 tablet 3   aspirin  EC 81 MG tablet Take 81 mg by mouth daily.     atorvastatin (LIPITOR) 40 MG tablet Take 1 tablet by mouth once daily 90 tablet 0   benazepril (LOTENSIN) 40 MG tablet Take 1 tablet (40 mg total) by mouth daily. Please keep upcoming appt in January 2023 with Dr. Lovena Le before anymore refills. Thank you Final Attempt 90 tablet 0   carvedilol (COREG) 25 MG tablet Take 1 tablet (25 mg total) by mouth 2 (two) times daily. 60 tablet 6   dofetilide (TIKOSYN) 500 MCG capsule Take 1 capsule (500 mcg total) by mouth 2 (two) times daily. 180 capsule 0   furosemide (LASIX) 40 MG tablet Take 1 tablet (40 mg total) by mouth daily. 90 tablet 1   glipiZIDE (GLUCOTROL XL) 10 MG 24 hr tablet Take 1 tablet by mouth once daily with breakfast 30 tablet 0   Insulin Pen Needle 31G X 5 MM MISC Use with Victoza as directed 100 each 11   Iron, Ferrous Sulfate, 325 (65 Fe) MG TABS      JARDIANCE 25 MG TABS tablet Take 25 mg  by mouth daily.     Magnesium Oxide 400 MG CAPS Take 1 capsule (400 mg total) by mouth 2 (two) times daily. 180 capsule 2   metFORMIN (GLUCOPHAGE) 1000 MG tablet TAKE 1 TABLET BY MOUTH TWICE DAILY WITH A MEAL 180 tablet 3   MOUNJARO 5 MG/0.5ML Pen SMARTSIG:5 Milligram(s) SUB-Q Once a Week     pregabalin (LYRICA) 150 MG capsule Take 1 capsule by mouth twice daily 60 capsule 5   rivaroxaban (XARELTO) 20 MG TABS tablet Take 1 tablet (20 mg total) by mouth daily with supper. 90 tablet 0   spironolactone (ALDACTONE) 25 MG tablet Take 1 tablet (25 mg total) by mouth daily. 90 tablet 3   tamsulosin (FLOMAX) 0.4 MG CAPS capsule Take 0.4 mg by mouth daily in the afternoon.      testosterone cypionate (DEPOTESTOSTERONE CYPIONATE) 200 MG/ML injection 1 ml IM q 14 days 10 mL 0   zaleplon (SONATA) 5 MG capsule TAKE 1 CAPSULE BY MOUTH AT BEDTIME AS NEEDED FOR SLEEP 90 capsule 1   No current facility-administered medications for this encounter.    No Known Allergies  Social History   Socioeconomic History    Marital status: Divorced    Spouse name: Not on file   Number of children: Not on file   Years of education: Not on file   Highest education level: Not on file  Occupational History   Occupation: Paramedic-EMT    Employer: [employer]  Tobacco Use   Smoking status: Never   Smokeless tobacco: Former    Types: Chew    Quit date: 02/06/2010   Tobacco comments:    Former Chew 07/07/21  Substance and Sexual Activity   Alcohol use: Yes    Alcohol/week: 18.0 standard drinks of alcohol    Types: 18 Cans of beer per week    Comment: 2-3 beers daily 07/07/21   Drug use: No   Sexual activity: Not on file  Other Topics Concern   Not on file  Social History Narrative   Married, no children.   Occupation: paramedic   Social Determinants of Radio broadcast assistant Strain: Not on file  Food Insecurity: Not on file  Transportation Needs: Not on file  Physical Activity: Not on file  Stress: Not on file  Social Connections: Not on file  Intimate Partner Violence: Not on file    Family History  Problem Relation Age of Onset   Heart disease Father        valvular disease   COPD Father    Prostate cancer Father    Arrhythmia Father    Colon cancer Neg Hx    The patient does not have a history of early familial atrial fibrillation or other arrhythmias.  ROS- All systems are reviewed and negative except as per the HPI above.  Physical Exam: Vitals:   07/07/21 1343  BP: 134/78  Pulse: 76  Weight: 129.6 kg  Height: '6\' 1"'$  (1.854 m)     GEN- The patient is a well appearing obese male, alert and oriented x 3 today.   HEENT-head normocephalic, atraumatic, sclera clear, conjunctiva pink, hearing intact, trachea midline. Lungs- Clear to ausculation bilaterally, normal work of breathing Heart- Regular rate and rhythm, no murmurs, rubs or gallops  GI- soft, NT, ND, + BS Extremities- no clubbing, cyanosis, or edema MS- no significant deformity or atrophy Skin- no rash or  lesion Psych- euthymic mood, full affect Neuro- strength and sensation are intact   Wt  Readings from Last 3 Encounters:  07/07/21 129.6 kg  01/07/21 134.7 kg  12/16/20 136.1 kg    EKG today demonstrates  SR Vent. rate 76 BPM PR interval 188 ms QRS duration 112 ms QT/QTcB 404/454 ms  Epic records are reviewed at length today  LHC 03/22/18 Elevated left ventricular end-diastolic pressure, 21 mmHg.  EF 50%.  Findings consistent with diastolic dysfunction. Widely patent coronary arteries. Normal left main Normal LAD.  Ostial first diagonal 60% narrowed. Normal circumflex Normal right coronary False positive myocardial perfusion study related to diaphragm attenuation  Echo 11/11/20  1. Left ventricular ejection fraction, by estimation, is 60 to 65%. The  left ventricle has normal function. The left ventricle has no regional  wall motion abnormalities. There is mild concentric left ventricular  hypertrophy. Left ventricular diastolic parameters were normal.   2. Right ventricular systolic function is normal. The right ventricular  size is mildly enlarged. There is normal pulmonary artery systolic  pressure. The estimated right ventricular systolic pressure is 65.0 mmHg.   3. The mitral valve is normal in structure. No evidence of mitral valve  regurgitation. No evidence of mitral stenosis.   4. The aortic valve is calcified. There is moderate calcification of the  aortic valve. There is mild thickening of the aortic valve. Aortic valve  regurgitation is not visualized. Mild to moderate aortic valve  sclerosis/calcification is present, without  any evidence of aortic stenosis.   5. Aortic dilatation noted. There is mild dilatation of the aortic root,  measuring 40 mm. There is mild dilatation of the ascending aorta,  measuring 38 mm.   6. The inferior vena cava is normal in size with greater than 50%  respiratory variability, suggesting right atrial pressure of 3 mmHg.     CHA2DS2-VASc Score = 4  The patient's score is based upon: CHF History: 1 HTN History: 1 Diabetes History: 1 Stroke History: 0 Vascular Disease History: 1 Age Score: 0 Gender Score: 0       ASSESSMENT AND PLAN: 1. Paroxysmal Atrial Fibrillation/typical atrial flutter The patient's CHA2DS2-VASc score is 4, indicating a 4.8% annual risk of stroke.   S/p atrial flutter ablation with Dr Lovena Le 07/09/18 S/p Tikosyn load 06/2019 Patient appears to be maintaining SR. Continue dofetilide 500 mcg BID. QT stable. Patient having lab work with PCP tomorrow, will have then fax results for dofetilide monitoring.  Continue Xarelto 20 mg daily Continue Coreg 25 mg BID Apple watch for monitoring  2. Secondary Hypercoagulable State (ICD10:  D68.69) The patient is at significant risk for stroke/thromboembolism based upon his CHA2DS2-VASc Score of 4.  Continue Rivaroxaban (Xarelto).   3. Obesity Body mass index is 37.71 kg/m. Lifestyle modification was discussed and encouraged including regular physical activity and weight reduction.  4. Obstructive sleep apnea Patient reports compliance with CPAP therapy. Followed by Dr Claiborne Billings.  5. HTN Stable, no changes today.  6. NICM EF normalized to 60-65% Appears euvolemic today.   Follow up with Dr Tanna Furry office per recall. AF clinic in one year.    [facility] [address] [place], [place] [address] [phone] 07/07/2021 1:55 PM

## 2021-08-28 ENCOUNTER — Other Ambulatory Visit (HOSPITAL_COMMUNITY): Payer: Self-pay | Admitting: Physician Assistant

## 2021-09-04 ENCOUNTER — Other Ambulatory Visit (HOSPITAL_COMMUNITY): Payer: Self-pay | Admitting: Physician Assistant

## 2021-11-01 ENCOUNTER — Other Ambulatory Visit: Payer: Self-pay | Admitting: Cardiology

## 2021-11-01 DIAGNOSIS — I428 Other cardiomyopathies: Secondary | ICD-10-CM

## 2022-01-26 ENCOUNTER — Other Ambulatory Visit: Payer: Self-pay | Admitting: Cardiology

## 2022-01-26 DIAGNOSIS — I428 Other cardiomyopathies: Secondary | ICD-10-CM

## 2022-02-09 ENCOUNTER — Encounter (HOSPITAL_COMMUNITY): Payer: Self-pay | Admitting: *Deleted

## 2022-02-14 ENCOUNTER — Ambulatory Visit: Payer: Commercial Managed Care - PPO | Admitting: Cardiovascular Disease

## 2022-02-15 ENCOUNTER — Other Ambulatory Visit: Payer: Self-pay

## 2022-02-15 ENCOUNTER — Encounter (HOSPITAL_BASED_OUTPATIENT_CLINIC_OR_DEPARTMENT_OTHER): Payer: Self-pay

## 2022-02-15 ENCOUNTER — Emergency Department (HOSPITAL_BASED_OUTPATIENT_CLINIC_OR_DEPARTMENT_OTHER): Payer: No Typology Code available for payment source | Admitting: Radiology

## 2022-02-15 ENCOUNTER — Inpatient Hospital Stay (HOSPITAL_BASED_OUTPATIENT_CLINIC_OR_DEPARTMENT_OTHER)
Admission: EM | Admit: 2022-02-15 | Discharge: 2022-02-17 | DRG: 871 | Disposition: A | Discharge status: Home / Self Care | Payer: No Typology Code available for payment source | Attending: Internal Medicine | Admitting: Internal Medicine

## 2022-02-15 DIAGNOSIS — I110 Hypertensive heart disease with heart failure: Secondary | ICD-10-CM | POA: Diagnosis present

## 2022-02-15 DIAGNOSIS — E785 Hyperlipidemia, unspecified: Secondary | ICD-10-CM | POA: Diagnosis present

## 2022-02-15 DIAGNOSIS — Z8042 Family history of malignant neoplasm of prostate: Secondary | ICD-10-CM

## 2022-02-15 DIAGNOSIS — J189 Pneumonia, unspecified organism: Principal | ICD-10-CM | POA: Diagnosis present

## 2022-02-15 DIAGNOSIS — Z7984 Long term (current) use of oral hypoglycemic drugs: Secondary | ICD-10-CM

## 2022-02-15 DIAGNOSIS — R6520 Severe sepsis without septic shock: Secondary | ICD-10-CM | POA: Diagnosis present

## 2022-02-15 DIAGNOSIS — E871 Hypo-osmolality and hyponatremia: Secondary | ICD-10-CM | POA: Diagnosis present

## 2022-02-15 DIAGNOSIS — Z1152 Encounter for screening for COVID-19: Secondary | ICD-10-CM

## 2022-02-15 DIAGNOSIS — E1140 Type 2 diabetes mellitus with diabetic neuropathy, unspecified: Secondary | ICD-10-CM | POA: Diagnosis not present

## 2022-02-15 DIAGNOSIS — Z79899 Other long term (current) drug therapy: Secondary | ICD-10-CM | POA: Diagnosis not present

## 2022-02-15 DIAGNOSIS — N179 Acute kidney failure, unspecified: Secondary | ICD-10-CM | POA: Diagnosis not present

## 2022-02-15 DIAGNOSIS — Z7985 Long-term (current) use of injectable non-insulin antidiabetic drugs: Secondary | ICD-10-CM

## 2022-02-15 DIAGNOSIS — E872 Acidosis, unspecified: Secondary | ICD-10-CM | POA: Diagnosis not present

## 2022-02-15 DIAGNOSIS — Z825 Family history of asthma and other chronic lower respiratory diseases: Secondary | ICD-10-CM

## 2022-02-15 DIAGNOSIS — I4892 Unspecified atrial flutter: Secondary | ICD-10-CM | POA: Diagnosis present

## 2022-02-15 DIAGNOSIS — Z8249 Family history of ischemic heart disease and other diseases of the circulatory system: Secondary | ICD-10-CM

## 2022-02-15 DIAGNOSIS — J9601 Acute respiratory failure with hypoxia: Secondary | ICD-10-CM | POA: Diagnosis not present

## 2022-02-15 DIAGNOSIS — A419 Sepsis, unspecified organism: Principal | ICD-10-CM | POA: Diagnosis present

## 2022-02-15 DIAGNOSIS — L989 Disorder of the skin and subcutaneous tissue, unspecified: Secondary | ICD-10-CM | POA: Diagnosis present

## 2022-02-15 DIAGNOSIS — G4733 Obstructive sleep apnea (adult) (pediatric): Secondary | ICD-10-CM | POA: Diagnosis present

## 2022-02-15 DIAGNOSIS — Z9884 Bariatric surgery status: Secondary | ICD-10-CM | POA: Diagnosis not present

## 2022-02-15 DIAGNOSIS — Z7901 Long term (current) use of anticoagulants: Secondary | ICD-10-CM

## 2022-02-15 DIAGNOSIS — E876 Hypokalemia: Secondary | ICD-10-CM | POA: Diagnosis not present

## 2022-02-15 DIAGNOSIS — I5032 Chronic diastolic (congestive) heart failure: Secondary | ICD-10-CM | POA: Diagnosis not present

## 2022-02-15 DIAGNOSIS — I447 Left bundle-branch block, unspecified: Secondary | ICD-10-CM | POA: Diagnosis not present

## 2022-02-15 DIAGNOSIS — F419 Anxiety disorder, unspecified: Secondary | ICD-10-CM | POA: Diagnosis not present

## 2022-02-15 DIAGNOSIS — I480 Paroxysmal atrial fibrillation: Secondary | ICD-10-CM | POA: Diagnosis present

## 2022-02-15 DIAGNOSIS — E860 Dehydration: Secondary | ICD-10-CM | POA: Diagnosis not present

## 2022-02-15 LAB — CBC WITH DIFFERENTIAL/PLATELET
Abs Immature Granulocytes: 0.23 10*3/uL — ABNORMAL HIGH (ref 0.00–0.07)
Basophils Absolute: 0.1 10*3/uL (ref 0.0–0.1)
Basophils Relative: 1 %
Eosinophils Absolute: 0 10*3/uL (ref 0.0–0.5)
Eosinophils Relative: 0 %
HCT: 43 % (ref 39.0–52.0)
Hemoglobin: 14.7 g/dL (ref 13.0–17.0)
Immature Granulocytes: 2 %
Lymphocytes Relative: 4 %
Lymphs Abs: 0.5 10*3/uL — ABNORMAL LOW (ref 0.7–4.0)
MCH: 30.9 pg (ref 26.0–34.0)
MCHC: 34.2 g/dL (ref 30.0–36.0)
MCV: 90.5 fL (ref 80.0–100.0)
Monocytes Absolute: 1 10*3/uL (ref 0.1–1.0)
Monocytes Relative: 6 %
Neutro Abs: 13.3 10*3/uL — ABNORMAL HIGH (ref 1.7–7.7)
Neutrophils Relative %: 87 %
Platelets: 142 10*3/uL — ABNORMAL LOW (ref 150–400)
RBC: 4.75 MIL/uL (ref 4.22–5.81)
RDW: 13.7 % (ref 11.5–15.5)
WBC: 15.1 10*3/uL — ABNORMAL HIGH (ref 4.0–10.5)
nRBC: 0 % (ref 0.0–0.2)

## 2022-02-15 LAB — COMPREHENSIVE METABOLIC PANEL
ALT: 14 U/L (ref 0–44)
AST: 20 U/L (ref 15–41)
Albumin: 3.9 g/dL (ref 3.5–5.0)
Alkaline Phosphatase: 53 U/L (ref 38–126)
Anion gap: 11 (ref 5–15)
BUN: 38 mg/dL — ABNORMAL HIGH (ref 6–20)
CO2: 26 mmol/L (ref 22–32)
Calcium: 9.4 mg/dL (ref 8.9–10.3)
Chloride: 90 mmol/L — ABNORMAL LOW (ref 98–111)
Creatinine, Ser: 1.55 mg/dL — ABNORMAL HIGH (ref 0.61–1.24)
GFR, Estimated: 51 mL/min — ABNORMAL LOW (ref >60)
Glucose, Bld: 269 mg/dL — ABNORMAL HIGH (ref 70–99)
Potassium: 4 mmol/L (ref 3.5–5.1)
Sodium: 127 mmol/L — ABNORMAL LOW (ref 135–145)
Total Bilirubin: 0.8 mg/dL (ref 0.3–1.2)
Total Protein: 7.6 g/dL (ref 6.5–8.1)

## 2022-02-15 LAB — URINALYSIS, ROUTINE W REFLEX MICROSCOPIC
Bacteria, UA: NONE SEEN
Bilirubin Urine: NEGATIVE
Glucose, UA: >1000 mg/dL — AB
Hgb urine dipstick: NEGATIVE
Ketones, ur: NEGATIVE mg/dL
Leukocytes,Ua: NEGATIVE
Nitrite: NEGATIVE
Specific Gravity, Urine: 1.015 (ref 1.005–1.030)
pH: 5.5 (ref 5.0–8.0)

## 2022-02-15 LAB — RESP PANEL BY RT-PCR (RSV, FLU A&B, COVID)  RVPGX2
Influenza A by PCR: NEGATIVE
Influenza B by PCR: NEGATIVE
Resp Syncytial Virus by PCR: NEGATIVE
SARS Coronavirus 2 by RT PCR: NEGATIVE

## 2022-02-15 LAB — HEMOGLOBIN A1C
Hgb A1c MFr Bld: 6.3 % — ABNORMAL HIGH (ref 4.8–5.6)
Mean Plasma Glucose: 134.11 mg/dL

## 2022-02-15 LAB — HIV ANTIBODY (ROUTINE TESTING W REFLEX): HIV Screen 4th Generation wRfx: NONREACTIVE

## 2022-02-15 LAB — MAGNESIUM: Magnesium: 2 mg/dL (ref 1.7–2.4)

## 2022-02-15 LAB — PROTIME-INR
INR: 2.1 — ABNORMAL HIGH (ref 0.8–1.2)
Prothrombin Time: 23.7 seconds — ABNORMAL HIGH (ref 11.4–15.2)

## 2022-02-15 LAB — STREP PNEUMONIAE URINARY ANTIGEN: Strep Pneumo Urinary Antigen: NEGATIVE

## 2022-02-15 LAB — LACTIC ACID, PLASMA
Lactic Acid, Venous: 7.6 mmol/L (ref 0.5–1.9)
Lactic Acid, Venous: 1.6 mmol/L (ref 0.5–1.9)

## 2022-02-15 LAB — LIPASE, BLOOD: Lipase: 20 U/L (ref 11–51)

## 2022-02-15 LAB — APTT: aPTT: 52 seconds — ABNORMAL HIGH (ref 24–36)

## 2022-02-15 MED ORDER — DOFETILIDE 500 MCG PO CAPS
500.0000 ug | ORAL_CAPSULE | Freq: Two times a day (BID) | ORAL | Status: DC
  Administered 2022-02-15 – 2022-02-17 (×4): 500 ug via ORAL
  Filled 2022-02-15 (×5): qty 1

## 2022-02-15 MED ORDER — OXYCODONE-ACETAMINOPHEN 5-325 MG PO TABS
1.0000 | ORAL_TABLET | Freq: Once | ORAL | Status: AC
  Administered 2022-02-16: 1 via ORAL
  Filled 2022-02-15: qty 1

## 2022-02-15 MED ORDER — LACTATED RINGERS IV SOLN: INTRAVENOUS | Status: AC

## 2022-02-15 MED ORDER — OXYCODONE-ACETAMINOPHEN 5-325 MG PO TABS
1.0000 | ORAL_TABLET | Freq: Once | ORAL | Status: AC
  Administered 2022-02-15: 1 via ORAL
  Filled 2022-02-15: qty 1

## 2022-02-15 MED ORDER — DICLOFENAC SODIUM 1 % EX GEL
4.0000 g | Freq: Four times a day (QID) | CUTANEOUS | Status: DC
  Administered 2022-02-15 – 2022-02-16 (×4): 4 g via TOPICAL
  Filled 2022-02-15: qty 100

## 2022-02-15 MED ORDER — ACETAMINOPHEN 325 MG PO TABS
650.0000 mg | ORAL_TABLET | Freq: Four times a day (QID) | ORAL | Status: DC | PRN
  Administered 2022-02-17: 650 mg via ORAL
  Filled 2022-02-15 (×2): qty 2

## 2022-02-15 MED ORDER — ACETAMINOPHEN 325 MG PO TABS
650.0000 mg | ORAL_TABLET | Freq: Once | ORAL | Status: AC
  Administered 2022-02-15: 650 mg via ORAL
  Filled 2022-02-15: qty 2

## 2022-02-15 MED ORDER — ONDANSETRON HCL 4 MG/2ML IJ SOLN
4.0000 mg | Freq: Once | INTRAMUSCULAR | Status: AC
  Administered 2022-02-15: 4 mg via INTRAVENOUS
  Filled 2022-02-15: qty 2

## 2022-02-15 MED ORDER — SODIUM CHLORIDE 0.9 % IV SOLN
500.0000 mg | INTRAVENOUS | Status: AC
  Administered 2022-02-16 – 2022-02-17 (×2): 500 mg via INTRAVENOUS
  Filled 2022-02-15 (×2): qty 5

## 2022-02-15 MED ORDER — ACETAMINOPHEN 650 MG RE SUPP: 650.0000 mg | Freq: Four times a day (QID) | RECTAL | Status: DC | PRN

## 2022-02-15 MED ORDER — LACTATED RINGERS IV BOLUS
1000.0000 mL | Freq: Once | INTRAVENOUS | Status: AC
  Administered 2022-02-15: 1000 mL via INTRAVENOUS

## 2022-02-15 MED ORDER — AZITHROMYCIN 250 MG PO TABS
500.0000 mg | ORAL_TABLET | Freq: Once | ORAL | Status: AC
  Administered 2022-02-15: 500 mg via ORAL
  Filled 2022-02-15: qty 2

## 2022-02-15 MED ORDER — INSULIN ASPART 100 UNIT/ML IJ SOLN
0.0000 [IU] | Freq: Three times a day (TID) | INTRAMUSCULAR | Status: DC
  Administered 2022-02-16 (×3): 3 [IU] via SUBCUTANEOUS
  Administered 2022-02-17: 2 [IU] via SUBCUTANEOUS
  Administered 2022-02-17: 3 [IU] via SUBCUTANEOUS

## 2022-02-15 MED ORDER — INSULIN ASPART 100 UNIT/ML IJ SOLN: 0.0000 [IU] | Freq: Every day | INTRAMUSCULAR | Status: DC

## 2022-02-15 MED ORDER — CEFTRIAXONE SODIUM 1 G IJ SOLR
1.0000 g | Freq: Once | INTRAMUSCULAR | Status: AC
  Administered 2022-02-15: 1 g via INTRAVENOUS
  Filled 2022-02-15: qty 10

## 2022-02-15 MED ORDER — RIVAROXABAN 20 MG PO TABS
20.0000 mg | ORAL_TABLET | Freq: Every day | ORAL | Status: DC
  Administered 2022-02-16: 20 mg via ORAL
  Filled 2022-02-15: qty 1

## 2022-02-15 MED ORDER — SODIUM CHLORIDE 0.9 % IV SOLN
2.0000 g | INTRAVENOUS | Status: DC
  Administered 2022-02-16 – 2022-02-17 (×2): 2 g via INTRAVENOUS
  Filled 2022-02-15 (×2): qty 20

## 2022-02-15 MED ORDER — LACTATED RINGERS IV BOLUS (SEPSIS): 2000.0000 mL | Freq: Once | INTRAVENOUS | Status: DC

## 2022-02-15 MED ORDER — ATORVASTATIN CALCIUM 40 MG PO TABS
40.0000 mg | ORAL_TABLET | Freq: Every day | ORAL | Status: DC
  Administered 2022-02-15 – 2022-02-16 (×2): 40 mg via ORAL
  Filled 2022-02-15 (×2): qty 1

## 2022-02-15 MED ORDER — SODIUM CHLORIDE 0.9 % IV BOLUS
2000.0000 mL | Freq: Once | INTRAVENOUS | Status: AC
  Administered 2022-02-15: 2000 mL via INTRAVENOUS

## 2022-02-15 MED ORDER — ALPRAZOLAM 0.5 MG PO TABS
0.5000 mg | ORAL_TABLET | Freq: Every evening | ORAL | Status: DC | PRN
  Administered 2022-02-15 – 2022-02-16 (×2): 0.5 mg via ORAL
  Filled 2022-02-15 (×2): qty 1

## 2022-02-15 NOTE — ED Provider Notes (Signed)
[name]   CSN: [id number] Arrival date & time: 02/15/22  [id number]     History  Chief Complaint  Patient presents with   N/V/D    Ethan Pitts is a 62 y.o. male.  Patient is a 62 year old male with a past medical history of paroxysmal A-fib on Xarelto, hypertension and diabetes presenting to the emergency department with nausea, vomiting and diarrhea started on Monday.  He states that he has had multiple episodes of diarrhea per day.  He states that his stools appear dark but he has been taking Pepto.  He denies any associated abdominal pain.  He states that he has been trying to drink fluids but has had decreased appetite.  He states he has had subjective fevers and chills.  He states he has been coughing and congested and has mild shortness of breath on exertion.  He denies any known sick contacts but states that he does work in Systems [employer].  He denies any recent antibiotic use, hospitalizations or travels.  He states he has been feeling generally weak all over.  The history is provided by the patient.       Home Medications Prior to Admission medications   Medication Sig Start Date End Date Taking? Authorizing Provider  ALPRAZolam (XANAX) 0.5 MG tablet TAKE 1 TABLET BY MOUTH EVERY 8 HOURS AS NEEDED FOR SLEEP OR ANXIETY 10/09/17  Yes McGowen, Adrian Blackwater, MD  amLODipine (NORVASC) 5 MG tablet TAKE 1 TABLET BY MOUTH ONCE DAILY . APPOINTMENT REQUIRED FOR FUTURE REFILLS 03/16/21  Yes Evans Lance, MD  atorvastatin (LIPITOR) 40 MG tablet Take 1 tablet by mouth once daily 09/01/19  Yes Crenshaw, Denice Bors, MD  benazepril (LOTENSIN) 40 MG tablet Take 1 tablet (40 mg total) by mouth daily. Please keep upcoming appt in January 2023 with Dr. Lovena Le before anymore refills. Thank you Final Attempt 10/07/20  Yes Evans Lance, MD  carvedilol (COREG) 25 MG tablet Take 1 tablet (25 mg total) by mouth 2 (two) times daily.  07/18/19 02/15/22 Yes Fenton, Clint R, PA  dofetilide (TIKOSYN) 500 MCG capsule Take 1 capsule by mouth twice daily 09/06/21  Yes Fenton, Clint R, PA  furosemide (LASIX) 40 MG tablet Take 1 tablet (40 mg total) by mouth daily. 10/20/20  Yes Lelon Perla, MD  glipiZIDE (GLUCOTROL XL) 10 MG 24 hr tablet Take 1 tablet by mouth once daily with breakfast 05/23/19  Yes McGowen, Adrian Blackwater, MD  JARDIANCE 25 MG TABS tablet Take 25 mg by mouth daily. 11/24/20  Yes [provider]  Magnesium Oxide 400 MG CAPS Take 1 capsule (400 mg total) by mouth 2 (two) times daily. 09/27/20  Yes Fenton, Clint R, PA  metFORMIN (GLUCOPHAGE) 1000 MG tablet TAKE 1 TABLET BY MOUTH TWICE DAILY WITH A MEAL 02/19/19  Yes McGowen, Adrian Blackwater, MD  MOUNJARO 5 MG/0.5ML Pen SMARTSIG:5 Milligram(s) SUB-Q Once a Week 06/13/21  Yes [provider]  pregabalin (LYRICA) 150 MG capsule Take 1 capsule by mouth twice daily 02/10/19  Yes McGowen, Adrian Blackwater, MD  spironolactone (ALDACTONE) 25 MG tablet Take 1 tablet by mouth once daily 01/26/22  Yes Crenshaw, Denice Bors, MD  tamsulosin (FLOMAX) 0.4 MG CAPS capsule Take 0.4 mg by mouth daily in the afternoon.    Yes [provider]  testosterone cypionate (DEPOTESTOSTERONE CYPIONATE) 200 MG/ML injection 1 ml IM q 14 days 01/08/19  Yes McGowen, Adrian Blackwater, MD  Alveda Reasons 20  MG TABS tablet TAKE 1 TABLET BY MOUTH ONCE DAILY WITH SUPPER 08/29/21  Yes Fenton, Clint R, PA  zaleplon (SONATA) 5 MG capsule TAKE 1 CAPSULE BY MOUTH AT BEDTIME AS NEEDED FOR SLEEP 02/19/19  Yes McGowen, Adrian Blackwater, MD  Insulin Pen Needle 31G X 5 MM MISC Use with Victoza as directed 01/15/18   McGowen, Adrian Blackwater, MD      Allergies    Patient has no known allergies.    Review of Systems   Review of Systems  Physical Exam Updated Vital Signs BP (!) 95/52   Pulse 80   Temp 99.5 F (37.5 C) (Oral)   Resp (!) 24   Ht 6' 1"$  (1.854 m)   Wt 127 kg   SpO2 96%   BMI 36.94 kg/m  Physical Exam Vitals and nursing note  reviewed.  Constitutional:      General: He is not in acute distress.    Appearance: Normal appearance.  HENT:     Head: Normocephalic and atraumatic.     Mouth/Throat:     Mouth: Mucous membranes are dry.     Pharynx: Oropharynx is clear.  Eyes:     Extraocular Movements: Extraocular movements intact.     Conjunctiva/sclera: Conjunctivae normal.  Cardiovascular:     Rate and Rhythm: Normal rate and regular rhythm.     Pulses: Normal pulses.     Heart sounds: Normal heart sounds.  Pulmonary:     Effort: Pulmonary effort is normal.     Breath sounds: Normal breath sounds.  Abdominal:     General: Abdomen is flat.     Palpations: Abdomen is soft.     Tenderness: There is no abdominal tenderness.  Musculoskeletal:        General: Normal range of motion.     Cervical back: Normal range of motion and neck supple.     Right lower leg: No edema.     Left lower leg: No edema.  Skin:    General: Skin is warm and dry.  Neurological:     General: No focal deficit present.     Mental Status: He is alert and oriented to person, place, and time.  Psychiatric:        Mood and Affect: Mood normal.        Behavior: Behavior normal.     ED Results / Procedures / Treatments   Labs (all labs ordered are listed, but only abnormal results are displayed) Labs Reviewed  COMPREHENSIVE METABOLIC PANEL - Abnormal; Notable for the following components:      Result Value   Sodium 127 (*)    Chloride 90 (*)    Glucose, Bld 269 (*)    BUN 38 (*)    Creatinine, Ser 1.55 (*)    GFR, Estimated 51 (*)    All other components within normal limits  CBC WITH DIFFERENTIAL/PLATELET - Abnormal; Notable for the following components:   WBC 15.1 (*)    Platelets 142 (*)    Neutro Abs 13.3 (*)    Lymphs Abs 0.5 (*)    Abs Immature Granulocytes 0.23 (*)    All other components within normal limits  LACTIC ACID, PLASMA - Abnormal; Notable for the following components:   Lactic Acid, Venous 7.6 (*)     All other components within normal limits  PROTIME-INR - Abnormal; Notable for the following components:   Prothrombin Time 23.7 (*)    INR 2.1 (*)    All other components within  normal limits  APTT - Abnormal; Notable for the following components:   aPTT 52 (*)    All other components within normal limits  RESP PANEL BY RT-PCR (RSV, FLU A&B, COVID)  [id number]  CULTURE, BLOOD (ROUTINE X 2)  CULTURE, BLOOD (ROUTINE X 2)  LIPASE, BLOOD  LACTIC ACID, PLASMA  MAGNESIUM  URINALYSIS, ROUTINE W REFLEX MICROSCOPIC    EKG EKG Interpretation  Date/Time:  Wednesday February 15 2022 11:41:31 EST Ventricular Rate:  119 PR Interval:  188 QRS Duration: 123 QT Interval:  400 QTC Calculation: 573 R Axis:   -16 Text Interpretation: Atrial flutter Left bundle branch block Now in atrial flutter compared to prior EKG today Confirmed by Leanord Asal ([id number]) on 02/15/2022 12:04:03 PM  Radiology DG Chest 2 View  Result Date: 02/15/2022 CLINICAL DATA:  Cough. EXAM: CHEST - 2 VIEW COMPARISON:  Chest radiograph dated April 08, 2019 FINDINGS: The heart size and mediastinal contours are within normal limits. Left lower lobe opacity with silhouetting of the left hemidiaphragm suggesting pneumonia. No appreciable pleural effusion suggesting. Thoracic spondylosis, no acute osseous abnormality. IMPRESSION: Left lower lobe opacity with silhouetting of the left hemidiaphragm concerning for pneumonia. Follow-up examination to resolution is recommended. Electronically Signed   By: Keane Police D.O.   On: 02/15/2022 09:08    Procedures .Critical Care  Performed by: Kemper Durie, DO Authorized by: Kemper Durie, DO   Critical care provider statement:    Critical care time (minutes):  40   Critical care time was exclusive of:  Separately billable procedures and treating other patients   Critical care was necessary to treat or prevent imminent or life-threatening deterioration of the following  conditions:  Sepsis   Critical care was time spent personally by me on the following activities:  Development of treatment plan with patient or surrogate, discussions with consultants, discussions with primary provider, evaluation of patient's response to treatment, examination of patient, obtaining history from patient or surrogate, ordering and performing treatments and interventions, ordering and review of laboratory studies, ordering and review of radiographic studies, pulse oximetry, re-evaluation of patient's condition and review of old charts   I assumed direction of critical care for this patient from another provider in my specialty: no       Medications Ordered in ED Medications  lactated ringers infusion ( Intravenous Not Given 02/15/22 1035)  oxyCODONE-acetaminophen (PERCOCET/ROXICET) 5-325 MG per tablet 1 tablet (has no administration in time range)  lactated ringers bolus 1,000 mL (0 mLs Intravenous Stopped 02/15/22 1020)  ondansetron (ZOFRAN) injection 4 mg (4 mg Intravenous Given 02/15/22 0908)  acetaminophen (TYLENOL) tablet 650 mg (650 mg Oral Given 02/15/22 0923)  cefTRIAXone (ROCEPHIN) 1 g in sodium chloride 0.9 % 100 mL IVPB (0 g Intravenous Stopped 02/15/22 1058)  azithromycin (ZITHROMAX) tablet 500 mg (500 mg Oral Given 02/15/22 1025)  sodium chloride 0.9 % bolus 2,000 mL (0 mLs Intravenous Stopped 02/15/22 1318)  lactated ringers bolus 1,000 mL (1,000 mLs Intravenous New Bag/Given 02/15/22 1332)  oxyCODONE-acetaminophen (PERCOCET/ROXICET) 5-325 MG per tablet 1 tablet (1 tablet Oral Given 02/15/22 1409)    ED Course/ Medical Decision Making/ A&P Clinical Course as of 02/15/22 1423  Wed Feb 15, 2022  0923 CXR with LLL infiltrate, concerning for pneumonia. [VK]  [id number] Has a leukocytosis and on repeat blood pressures, they are soft in the 123XX123 to 0000000 systolic.  He will be started on sepsis protocol and will be given 30 cc/kg fluids and cultures and lactic will be  sent. [VK]  1204  Patient does have a lactic of 7.6, consistent with severe sepsis.  Blood pressures have remained stable but soft in the 0000000 systolic.  He desatted to 88% on room air and was placed on 2 L nasal cannula.  He has been going in and out of atrial flutter on the monitor though rate controlled in the 100s while in atrial flutter.  ICU will be consulted for admission. [VK]  1213 I spoke with critical care and they recommended disposition based on repeat lactic. If remains elevated, he will go to ICU, if improved he will be stable for PCU. [VK]  1344 I spoke with Dr. Trilby Drummer, hospitalist, who recommended ED to ED transfer for reevaluation to determine ICU vs floors for admission. BP remains soft between 123XX123 systolic with 4th liter of IVF currently running to complete his 20 cc/kg [VK]    Clinical Course User Index [VK] Kemper Durie, DO                             Medical Decision Making This patient presents to the ED with chief complaint(s) of N/V/D with pertinent past medical history of paroxysmal A-fib on Xarelto, hypertension, diabetes which further complicates the presenting complaint. The complaint involves an extensive differential diagnosis and also carries with it a high risk of complications and morbidity.    The differential diagnosis includes dehydration, electrolyte abnormality, hyperglycemic crisis, DKA, viral syndrome, atypical ACS, pneumonia, pulmonary edema, pleural effusion, GI bleed, C. 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  He had mildly dry mucous membranes and will be started on IV fluids.  He  was given Zofran for symptomatic control.  He will have labs including viral swab and chest x-ray performed and will be closely reassessed.  Independent labs interpretation:  The following labs were independently interpreted: lactic acidosis -> normalized on repeat, leukocytosis, mild hyponatremia  Independent visualization of imaging: - I independently visualized the following imaging with scope of interpretation limited to determining acute life threatening conditions related to emergency care: CXR, which revealed LLL infiltrate concerning for pneumonia  Consultation: - Consulted or discussed management/test interpretation w/ external professional: Intensivist, hospitalist  Consideration for admission or further workup: patient requires admission for further management of his sepsis secondary to pneumonia Social Determinants of health: N/A    Amount and/or Complexity of Data Reviewed Labs: ordered. Radiology: ordered. ECG/medicine tests: ordered.  Risk OTC drugs. Prescription drug management.          Final Clinical Impression(s) / ED Diagnoses Final diagnoses:  Pneumonia of left lower lobe due to infectious organism  Sepsis without acute organ dysfunction, due to unspecified organism [facility])  Hyponatremia    Rx / DC Orders ED Discharge Orders     None         Kemper Durie, DO 02/15/22 1423

## 2022-02-15 NOTE — ED Notes (Signed)
Report given to ED charge RN at [facility]

## 2022-02-15 NOTE — ED Notes (Signed)
CRITICAL VALUE STICKER  CRITICAL VALUE:Lactate 7.6  RECEIVER (on-site recipient of call):Shawnie Pons, RN  DATE & TIME NOTIFIED:   MESSENGER (representative from lab):  MD NOTIFIED: Dr. Maylon Peppers  TIME OF NOTIFICATION:1117  RESPONSE:

## 2022-02-15 NOTE — ED Notes (Signed)
Pt remains on 2 lpm [place].

## 2022-02-15 NOTE — Progress Notes (Deleted)
Cardiology Office Note Date:  02/15/2022  Patient ID:  Ethan Pitts, Ethan Pitts 1960-01-28, MRN [id number] PCP:  Jettie Booze, NP  Cardiologist:  Dr. Evelena Asa Electrophysiologist: Dr. Lovena Le  ***refresh   Chief Complaint: *** annual visit  History of Present Illness: Ethan Pitts is a 62 y.o. male with history of OSA *** w/CPAP, morbid obesity (hx of bariatric surgery), AFib, NICM, HTN, HLD, DM  He saw Dr. Lovena Le last 01/07/21, doing well, some peripheral edema, room for dietary improvement.  Maintaining SR, no changes were made.  He saw the AFib clinic 07/07/21, doing well, planned for labs, no changes were made.  *** tikosyn, EKG, labs, med list *** xarelto, bleeding, dose, labs  Device information AFlutter, diagnosed Dec 2020 CTI ablation 07/09/2018 Subsequent AFib also mentioned  Tikosyn started 06/03/2019   Past Medical History:  Diagnosis Date   Anxiety    Arthritis    lower back    Atrial flutter ([place]) 2020   Cards->lopressor and anticoag, ablation. In sinus still as of 08/2018 cards f/u.  Recurrence + PAF->requiring pt to get back on xarelto   BPH with obstruction/lower urinary tract symptoms    started flomax via urol 03/2019   CAD (coronary artery disease) 03/2018   Ostial first diagonal 60% narrowed, otherwise normal coronaries. Dr. Stanford Breed added ASA and statin 08/2018.   Depression    Diabetes mellitus    False positive stress test 03/2018   Cath showed normal coronaries with EF 50%   GERD (gastroesophageal reflux disease)    Hyperlipidemia    Hypertension    Hypogonadism, male 05/2015   Increased prostate specific antigen (PSA) velocity 2019   06/2017 velocity up; recheck 10/2017 back to baseline.  Plan repeat 1 yr.   Low back pain 2016   Left lumbar radiculopathy, lumbar spondylolisthesis.  Sciatica episode 03/2016.  Ortho getting L spine MRI as of 07/2016.   Microcytic anemia 12/2015   suspect iron def due to malabsorption.  Hemoccults NEG  01/26/16, ferrous sulfate started.   Neuropathic pain of left foot    NICM (nonischemic cardiomyopathy) ([place]) 05/2018   EF 40-45%, +LV hypokinesis. Suspected to be tachycardia-mediated->Dr. Stanford Breed to rpt echo fall 2020.   OSA on CPAP    cpap settings at 3   Peyronie's disease 2021   Dr. Porfirio Oar (collegenase intralesional injections)    Past Surgical History:  Procedure Laterality Date   A-FLUTTER ABLATION N/A 07/09/2018   Procedure: A-FLUTTER ABLATION;  Surgeon: Evans Lance, MD;  Location: [facility];  Service: Cardiovascular;  Laterality: N/A;   ADENOIDECTOMY     BREATH TEK H PYLORI N/A 01/08/2013   Procedure: [place];  Surgeon: [name], MD;  Location: [facility];  Service: General;  Laterality: N/A;   CARDIAC CATHETERIZATION  03/2018   No signi obstructive CAD (suspected FALSE POSITIVE STRESS TEST).  EF 50%.  Diastolic dysfunction.   CARDIOVASCULAR STRESS TEST  03/2018   Intermediate risk; EF 40%, medium sized defect with peri-infarct ischemia-->follow up cath showed no signif obstructive CAD.   COLONOSCOPY  04/17/2014   Benign polyp x 1.  Recall 10 yrs.   GASTRIC ROUX-EN-Y N/A 04/14/2013   Procedure: LAPAROSCOPIC ROUX-EN-Y GASTRIC BYPASS WITH UPPER ENDOSCOPY;  Surgeon: [name], MD;  Location: [facility];  Service: General;  Laterality: N/A;   LEFT HEART CATH AND CORONARY ANGIOGRAPHY N/A 03/22/2018   NO CAD.  EF 50%. Procedure: LEFT HEART CATH AND CORONARY ANGIOGRAPHY;  Surgeon: Belva Crome, MD;  Location: [facility];  Service: Cardiovascular;  Laterality: N/A;   Rhythm monitoring  01/08/2019   7 day monitor->Sinus rhythm with occasional PAC, PVC, paroxysmal atrial flutter, paroxysmal atrial fibrillation and 7 beats nonsustained ventricular tachycardia: xarelto restarted   TONSILLECTOMY     TRANSTHORACIC ECHOCARDIOGRAM  05/13/2018;12/03/18   05/2018 severe hypokinesis of LV inferior and inferolateral walls, EF 40-45%, correlates with  stress test.  0000000 [id number], diastolic fxn/LV filling pressure not determined due to pt in a fib/flutter    No current facility-administered medications for this visit.   Current Outpatient Medications  Medication Sig Dispense Refill   ALPRAZolam (XANAX) 0.5 MG tablet TAKE 1 TABLET BY MOUTH EVERY 8 HOURS AS NEEDED FOR SLEEP OR ANXIETY 90 tablet 5   amLODipine (NORVASC) 5 MG tablet TAKE 1 TABLET BY MOUTH ONCE DAILY . APPOINTMENT REQUIRED FOR FUTURE REFILLS 90 tablet 3   atorvastatin (LIPITOR) 40 MG tablet Take 1 tablet by mouth once daily 90 tablet 0   benazepril (LOTENSIN) 40 MG tablet Take 1 tablet (40 mg total) by mouth daily. Please keep upcoming appt in January 2023 with Dr. Lovena Le before anymore refills. Thank you Final Attempt 90 tablet 0   carvedilol (COREG) 25 MG tablet Take 1 tablet (25 mg total) by mouth 2 (two) times daily. 60 tablet 6   dofetilide (TIKOSYN) 500 MCG capsule Take 1 capsule by mouth twice daily 180 capsule 1   furosemide (LASIX) 40 MG tablet Take 1 tablet (40 mg total) by mouth daily. 90 tablet 1   glipiZIDE (GLUCOTROL XL) 10 MG 24 hr tablet Take 1 tablet by mouth once daily with breakfast 30 tablet 0   Insulin Pen Needle 31G X 5 MM MISC Use with Victoza as directed 100 each 11   JARDIANCE 25 MG TABS tablet Take 25 mg by mouth daily.     Magnesium Oxide 400 MG CAPS Take 1 capsule (400 mg total) by mouth 2 (two) times daily. 180 capsule 2   metFORMIN (GLUCOPHAGE) 1000 MG tablet TAKE 1 TABLET BY MOUTH TWICE DAILY WITH A MEAL 180 tablet 3   MOUNJARO 5 MG/0.5ML Pen SMARTSIG:5 Milligram(s) SUB-Q Once a Week     pregabalin (LYRICA) 150 MG capsule Take 1 capsule by mouth twice daily 60 capsule 5   spironolactone (ALDACTONE) 25 MG tablet Take 1 tablet by mouth once daily 90 tablet 1   tamsulosin (FLOMAX) 0.4 MG CAPS capsule Take 0.4 mg by mouth daily in the afternoon.      testosterone cypionate (DEPOTESTOSTERONE CYPIONATE) 200 MG/ML injection 1 ml IM q 14 days 10 mL 0    XARELTO 20 MG TABS tablet TAKE 1 TABLET BY MOUTH ONCE DAILY WITH SUPPER 90 tablet 2   zaleplon (SONATA) 5 MG capsule TAKE 1 CAPSULE BY MOUTH AT BEDTIME AS NEEDED FOR SLEEP 90 capsule 1   Facility-Administered Medications Ordered in Other Visits  Medication Dose Route Frequency Provider Last Rate Last Admin   lactated ringers infusion   Intravenous Continuous Leanord Asal K, DO        Allergies:   Patient has no known allergies.   Social History:  The patient  reports that he has never smoked. He quit smokeless tobacco use about 12 years ago.  His smokeless tobacco use included chew. He reports current alcohol use of about 18.0 standard drinks of alcohol per week. He reports that he does not use drugs.   Family History:  The patient's family history  includes Arrhythmia in his father; COPD in his father; Heart disease in his father; Prostate cancer in his father.  ROS:  Please see the history of present illness.    All other systems are reviewed and otherwise negative.   PHYSICAL EXAM:  VS:  There were no vitals taken for this visit. BMI: There is no height or weight on file to calculate BMI. Well nourished, well developed, in no acute distress HEENT: normocephalic, atraumatic Neck: no JVD, carotid bruits or masses Cardiac:  *** RRR; no significant murmurs, no rubs, or gallops Lungs:  *** CTA b/l, no wheezing, rhonchi or rales Abd: soft, nontender MS: no deformity or *** atrophy Ext: *** no edema Skin: warm and dry, no rash Neuro:  No gross deficits appreciated Psych: euthymic mood, full affect    EKG:  Done today and reviewed by myself shows  ***    11/11/2020: TTE 1. Left ventricular ejection fraction, by estimation, is 60 to 65%. The  left ventricle has normal function. The left ventricle has no regional  wall motion abnormalities. There is mild concentric left ventricular  hypertrophy. Left ventricular diastolic  parameters were normal.   2. Right ventricular  systolic function is normal. The right ventricular  size is mildly enlarged. There is normal pulmonary artery systolic  pressure. The estimated right ventricular systolic pressure is [id number] mmHg.   3. The mitral valve is normal in structure. No evidence of mitral valve  regurgitation. No evidence of mitral stenosis.   4. The aortic valve is calcified. There is moderate calcification of the  aortic valve. There is mild thickening of the aortic valve. Aortic valve  regurgitation is not visualized. Mild to moderate aortic valve  sclerosis/calcification is present, without  any evidence of aortic stenosis.   5. Aortic dilatation noted. There is mild dilatation of the aortic root,  measuring 40 mm. There is mild dilatation of the ascending aorta,  measuring 38 mm.   6. The inferior vena cava is normal in size with greater than 50%  respiratory variability, suggesting right atrial pressure of 3 mmHg.    12/03/2018: TTE  1. Left ventricular ejection fraction, by visual estimation, is 40 to  45%. The left ventricle has mildly decreased function. There is mildly  increased left ventricular hypertrophy.   2. Left ventricular diastolic function could not be evaluated.   3. Global right ventricle was not well visualized.The right ventricular  size is not well visualized. Right vetricular wall thickness was not  assessed.   4. Left atrial size was normal.   5. Right atrial size was moderately dilated.   6. Mild to moderate aortic valve annular calcification.   7. The mitral valve is normal in structure. No evidence of mitral valve  regurgitation. No evidence of mitral stenosis.   8. The tricuspid valve is grossly normal. Tricuspid valve regurgitation  is not demonstrated.   9. The aortic valve is normal in structure. Aortic valve regurgitation is  not visualized. No evidence of aortic valve sclerosis or stenosis.  10. The pulmonic valve was normal in structure. Pulmonic valve  regurgitation is not  visualized.  11. The atrial septum is grossly normal.    07/09/2018: EPS/ablation Conclusion: succssful EPS/RFA of the typical atrial flutter isthmus resulting in the stim to A interval of 146m.    05/13/2018: TTE 1. Severe akinesis of the left ventricular, entire inferior wall and  inferolateral wall.   2. Findings corrleate with NUC stress test.  Consider cardiac MRI to more clearly define anatomy, EF.      Images were challenging despite Definity constrast.   3. The left ventricle has mild-moderately reduced systolic function, with  an ejection fraction of 40-45%.   03/22/2018: [facility] Elevated left ventricular end-diastolic pressure, 21 mmHg.  EF 50%.  Findings consistent with diastolic dysfunction. Widely patent coronary arteries. Normal left main Normal LAD.  Ostial first diagonal 60% narrowed. Normal circumflex Normal right coronary False positive myocardial perfusion study related to diaphragm attenuation.  Recent Labs: 02/15/2022: ALT 14; BUN 38; Creatinine, Ser 1.55; Hemoglobin 14.7; Magnesium 2.0; Platelets 142; Potassium 4.0; Sodium 127  No results found for requested labs within last 365 days.   Estimated Creatinine Clearance: 70.8 mL/min (A) (by C-G formula based on SCr of 1.55 mg/dL (H)).   Wt Readings from Last 3 Encounters:  02/15/22 280 lb (127 kg)  07/07/21 285 lb 12.8 oz (129.6 kg)  01/07/21 297 lb (134.7 kg)     Other studies reviewed: Additional studies/records reviewed today include: summarized above  ASSESSMENT AND PLAN:  Paroxysmal AFib CHA2DS2Vasc is 3, on Xarelto, *** appropriately dosed *** on Tikosyn ***  NICM Recovered LVEF ***  HTN ***  Hypercoagulable state     Disposition: F/u with ***  Current medicines are reviewed at length with the patient today.  The patient did not have any concerns regarding medicines.  Venetia Night, PA-C 02/15/2022 1:36 PM     [address] [place]  [place] [address] [phone] (office)  [phone] (fax)

## 2022-02-15 NOTE — ED Notes (Signed)
Provided pt a sandwich.

## 2022-02-15 NOTE — ED Notes (Signed)
CBG reading 221.

## 2022-02-15 NOTE — Hospital Course (Addendum)
Feels better, lidocaine patch helping with pain.

## 2022-02-15 NOTE — ED Notes (Signed)
Thomas[facility]George at [facility] will send transport for ED to [facility], Dr. Tinnie Gens is accepting at [facility].-ABB(NS)

## 2022-02-15 NOTE — ED Provider Notes (Signed)
4:49 PM Patient arrived as an ED to ED transfer to be evaluated and then help determine level of care need for this new pneumonia with oxygen requirement.  Initial lactic acid at [facility] was 7.6 but is now improved and is normal.  Patient is still resting.  He has maintaining oxygen saturations in the 90s on the 2 L nasal cannula and his blood pressure is over 100.  Given his lack of active chest pain or shortness of breath, I do feel is likely stable for stepdown as opposed to ICU.  Will call medicine for admission.   Marykay Mccleod, Gwenyth Allegra, MD 02/15/22 [id number]

## 2022-02-15 NOTE — ED Triage Notes (Signed)
He c/o intermittent chills, plus n/v/d and "dizzy sometimes" since this Mon> (2 days ago). He is alert and oriented x 4 with clear speech. Monitor shows nsr without ectopy.

## 2022-02-15 NOTE — Sepsis Progress Note (Signed)
eLink is following this Code Sepsis. °

## 2022-02-15 NOTE — ED Notes (Addendum)
ED TO INPATIENT HANDOFF REPORT  ED Nurse Name and Phone #: Drayk Humbarger/ [phone]  S Name/Age/Gender Ethan Pitts 62 y.o. male Room/Bed: 028C/028C  Code Status   Code Status: Full Code  Home/SNF/Other Home Patient oriented to: self, place, time, and situation Is this baseline? Yes   Triage Complete: Triage complete  Chief Complaint Sepsis due to pneumonia ([place]) [J18.9, A41.9]  Triage Note He c/o intermittent chills, plus n/v/d and "dizzy sometimes" since this Mon> (2 days ago). He is alert and oriented x 4 with clear speech. Monitor shows nsr without ectopy.  Pt arrives via [facility] as a transfer from [facility]. Pt reports n/v/d and fever that has been ongoing since Monday. [facility] being [facility] with [facility] and sepsis.    Allergies No Known Allergies  Level of Care/Admitting Diagnosis ED Disposition     ED Disposition  Admit   Condition  --   Comment  Hospital Area: [facility] [[id number]]  Level of Care: Progressive [102]  Admit to Progressive based on following criteria: MULTISYSTEM THREATS such as stable sepsis, metabolic/electrolyte imbalance with or without encephalopathy that is responding to early treatment.  May place patient in observation at [facility] or [facility] if equivalent level of care is available:: No  Covid Evaluation: Confirmed COVID Negative  Diagnosis: Sepsis [facility] to pneumonia [id number]  Admitting Physician: Aldine Contes [id number]  Attending Physician: Aldine Contes [phone]          B Medical/Surgery History Past Medical History:  Diagnosis Date   Anxiety    Arthritis    lower back    Atrial flutter ([place]) 2020   Cards->lopressor and anticoag, ablation. In sinus still as of 08/2018 cards f/u.  Recurrence + PAF->requiring pt to get back on xarelto   BPH with obstruction/lower urinary tract symptoms    started flomax via urol 03/2019   CAD (coronary artery disease) 03/2018   Ostial  first diagonal 60% narrowed, otherwise normal coronaries. Dr. Stanford Breed added ASA and statin 08/2018.   Depression    Diabetes mellitus    False positive stress test 03/2018   Cath showed normal coronaries with EF 50%   GERD (gastroesophageal reflux disease)    Hyperlipidemia    Hypertension    Hypogonadism, male 05/2015   Increased prostate specific antigen (PSA) velocity 2019   06/2017 velocity up; recheck 10/2017 back to baseline.  Plan repeat 1 yr.   Low back pain 2016   Left lumbar radiculopathy, lumbar spondylolisthesis.  Sciatica episode 03/2016.  Ortho getting L spine MRI as of 07/2016.   Microcytic anemia 12/2015   suspect iron def due to malabsorption.  Hemoccults NEG 01/26/16, ferrous sulfate started.   Neuropathic pain of left foot    NICM (nonischemic cardiomyopathy) ([place]) 05/2018   EF 40-45%, +LV hypokinesis. Suspected to be tachycardia-mediated->Dr. Stanford Breed to rpt echo fall 2020.   OSA on CPAP    cpap settings at 3   Peyronie's disease 2021   Dr. Porfirio Oar (collegenase intralesional injections)   Past Surgical History:  Procedure Laterality Date   A-FLUTTER ABLATION N/A 07/09/2018   Procedure: A-FLUTTER ABLATION;  Surgeon: Evans Lance, MD;  Location: [facility] [place] [facility];  Service: Cardiovascular;  Laterality: N/A;   ADENOIDECTOMY     BREATH TEK H PYLORI N/A 01/08/2013   Procedure: BREATH TEK H PYLORI;  Surgeon: [name], MD;  Location: [facility];  Service: General;  Laterality: N/A;   CARDIAC CATHETERIZATION  03/2018  No signi obstructive CAD (suspected FALSE POSITIVE STRESS TEST).  EF 50%.  Diastolic dysfunction.   CARDIOVASCULAR STRESS TEST  03/2018   Intermediate risk; EF 40%, medium sized defect with peri-infarct ischemia-->follow up cath showed no signif obstructive CAD.   COLONOSCOPY  04/17/2014   Benign polyp x 1.  Recall 10 yrs.   GASTRIC ROUX-EN-Y N/A 04/14/2013   Procedure: LAPAROSCOPIC ROUX-EN-Y GASTRIC BYPASS WITH UPPER ENDOSCOPY;  Surgeon:  [name], MD;  Location: [facility];  Service: General;  Laterality: N/A;   LEFT HEART CATH AND CORONARY ANGIOGRAPHY N/A 03/22/2018   NO CAD.  EF 50%. Procedure: LEFT HEART CATH AND CORONARY ANGIOGRAPHY;  Surgeon: Belva Crome, MD;  Location: [facility];  Service: Cardiovascular;  Laterality: N/A;   Rhythm monitoring  01/08/2019   7 day monitor->Sinus rhythm with occasional PAC, PVC, paroxysmal atrial flutter, paroxysmal atrial fibrillation and 7 beats nonsustained ventricular tachycardia: xarelto restarted   TONSILLECTOMY     TRANSTHORACIC ECHOCARDIOGRAM  05/13/2018;12/03/18   05/2018 severe hypokinesis of LV inferior and inferolateral walls, EF 40-45%, correlates with stress test.  [id number], diastolic fxn/LV filling pressure not determined due to pt in a fib/flutter     A IV Location/Drains/Wounds Patient Lines/Drains/Airways Status     Active Line/Drains/Airways     Name Placement date Placement time Site Days   Peripheral IV 02/15/22 20 G 1" Anterior;Distal;Left;Upper Arm 02/15/22  0900  Arm  less than 1   Peripheral IV 02/15/22 20 G 1" Posterior;Right Hand 02/15/22  1014  Hand  less than 1            Intake/Output Last 24 hours  Intake/Output Summary (Last 24 hours) at 02/15/2022 1912 Last data filed at 02/15/2022 1443 Gross per 24 hour  Intake 5139.21 ml  Output --  Net 5139.21 ml    Labs/Imaging Results for orders placed or performed during the hospital encounter of 02/15/22 (from the past 48 hour(s))  Comprehensive metabolic panel     Status: Abnormal   Collection Time: 02/15/22  9:00 AM  Result Value Ref Range   Sodium 127 (L) 135 - 145 mmol/L   Potassium 4.0 3.5 - 5.1 mmol/L   Chloride 90 (L) 98 - 111 mmol/L   CO2 26 22 - 32 mmol/L   Glucose, Bld 269 (H) 70 - 99 mg/dL    Comment: Glucose reference range applies only to samples taken after fasting for at least 8 hours.   BUN 38 (H) 6 - 20 mg/dL   Creatinine, Ser 1.55 (H) 0.61 - 1.24 mg/dL    Calcium 9.4 8.9 - 10.3 mg/dL   Total Protein 7.6 6.5 - 8.1 g/dL   Albumin 3.9 3.5 - 5.0 g/dL   AST 20 15 - 41 U/L   ALT 14 0 - 44 U/L   Alkaline Phosphatase 53 38 - 126 U/L   Total Bilirubin 0.8 0.3 - 1.2 mg/dL   GFR, Estimated 51 (L) >60 mL/min    Comment: (NOTE) Calculated using the CKD-EPI Creatinine Equation (2021)    Anion gap 11 5 - 15    Comment: Performed at [facility], [place], [place] [address]  Lipase, blood     Status: None   Collection Time: 02/15/22  9:00 AM  Result Value Ref Range   Lipase 20 11 - 51 U/L    Comment: Performed at [facility], [address] [place], [place] [address]  CBC with Differential     Status:  Abnormal   Collection Time: 02/15/22  9:00 AM  Result Value Ref Range   WBC 15.1 (H) 4.0 - 10.5 K/uL   RBC 4.75 4.22 - 5.81 MIL/uL   Hemoglobin 14.7 13.0 - 17.0 g/dL   HCT 43.0 39.0 - 52.0 %   MCV 90.5 80.0 - 100.0 fL   MCH 30.9 26.0 - 34.0 pg   MCHC 34.2 30.0 - 36.0 g/dL   RDW 13.7 11.5 - 15.5 %   Platelets 142 (L) 150 - 400 K/uL   nRBC 0.0 0.0 - 0.2 %   Neutrophils Relative % 87 %   Neutro Abs 13.3 (H) 1.7 - 7.7 K/uL   Lymphocytes Relative 4 %   Lymphs Abs 0.5 (L) 0.7 - 4.0 K/uL   Monocytes Relative 6 %   Monocytes Absolute 1.0 0.1 - 1.0 K/uL   Eosinophils Relative 0 %   Eosinophils Absolute 0.0 0.0 - 0.5 K/uL   Basophils Relative 1 %   Basophils Absolute 0.1 0.0 - 0.1 K/uL   Immature Granulocytes 2 %   Abs Immature Granulocytes 0.23 (H) 0.00 - 0.07 K/uL    Comment: Performed at [facility], [address], [place], [place] [address]  Resp panel by RT-PCR (RSV, Flu A&B, Covid) Anterior Nasal Swab     Status: None   Collection Time: 02/15/22  9:00 AM   Specimen: Anterior Nasal Swab  Result Value Ref Range   SARS Coronavirus 2 by RT PCR NEGATIVE NEGATIVE    Comment: (NOTE) SARS-CoV-2 target nucleic acids are NOT DETECTED.  The SARS-CoV-2 RNA is generally  detectable in upper respiratory specimens during the acute phase of infection. The lowest concentration of SARS-CoV-2 viral copies this assay can detect is 138 copies/mL. A negative result does not preclude SARS-Cov-2 infection and should not be used as the sole basis for treatment or other patient management decisions. A negative result may occur with  improper specimen collection/handling, submission of specimen other than nasopharyngeal swab, presence of viral mutation(s) within the areas targeted by this assay, and inadequate number of viral copies(<138 copies/mL). A negative result must be combined with clinical observations, patient history, and epidemiological information. The expected result is Negative.  Fact Sheet for Patients:  EntrepreneurPulse.com.au  Fact Sheet for Healthcare Providers:  IncredibleEmployment.be  This test is no t yet approved or cleared by the [place] FDA and  has been authorized for detection and/or diagnosis of SARS-CoV-2 by FDA under an Emergency Use Authorization (EUA). This EUA will remain  in effect (meaning this test can be used) for the duration of the COVID-19 declaration under Section 564(b)(1) of the Act, 21 U.S.C.section 360bbb-3(b)(1), unless the authorization is terminated  or revoked sooner.       Influenza A by PCR NEGATIVE NEGATIVE   Influenza B by PCR NEGATIVE NEGATIVE    Comment: (NOTE) The Xpert Xpress SARS-CoV-2/FLU/RSV plus assay is intended as an aid in the diagnosis of influenza from Nasopharyngeal swab specimens and should not be used as a sole basis for treatment. Nasal washings and aspirates are unacceptable for Xpert Xpress SARS-CoV-2/FLU/RSV testing.  Fact Sheet for Patients: EntrepreneurPulse.com.au  Fact Sheet for Healthcare Providers: IncredibleEmployment.be  This test is not yet approved or cleared by the [place] FDA and has  been authorized for detection and/or diagnosis of SARS-CoV-2 by FDA under an Emergency Use Authorization (EUA). This EUA will remain in effect (meaning this test can be used) for the duration of the COVID-19 declaration under Section 564(b)(1) of the  Act, 21 U.S.C. section 360bbb-3(b)(1), unless the authorization is terminated or revoked.     Resp Syncytial Virus by PCR NEGATIVE NEGATIVE    Comment: (NOTE) Fact Sheet for Patients: EntrepreneurPulse.com.au  Fact Sheet for Healthcare Providers: IncredibleEmployment.be  This test is not yet approved or cleared by the [place] FDA and has been authorized for detection and/or diagnosis of SARS-CoV-2 by FDA under an Emergency Use Authorization (EUA). This EUA will remain in effect (meaning this test can be used) for the duration of the COVID-19 declaration under Section 564(b)(1) of the Act, 21 U.S.C. section 360bbb-3(b)(1), unless the authorization is terminated or revoked.  Performed at [facility], [address] [place], [place] [address]   Protime-INR     Status: Abnormal   Collection Time: 02/15/22  9:00 AM  Result Value Ref Range   Prothrombin Time 23.7 (H) 11.4 - 15.2 seconds   INR 2.1 (H) 0.8 - 1.2    Comment: (NOTE) INR goal varies based on device and disease states. Performed at [facility], [address] [place], [place] [address]   APTT     Status: Abnormal   Collection Time: 02/15/22  9:00 AM  Result Value Ref Range   aPTT 52 (H) 24 - 36 seconds    Comment:        IF BASELINE aPTT IS ELEVATED, SUGGEST PATIENT RISK ASSESSMENT BE USED TO DETERMINE APPROPRIATE ANTICOAGULANT THERAPY. Performed at [facility], [address] [place], [place] [address]   Lactic acid, plasma     Status: Abnormal   Collection Time: 02/15/22 10:00 AM  Result Value Ref Range   Lactic Acid, Venous 7.6 (HH) 0.5 - 1.9 mmol/L     Comment: CRITICAL RESULT CALLED TO, READ BACK BY AND VERIFIED WITH: SMITH,T,RN @ 1116 02/15/22 BY GWYN,P Performed at [facility], [address] [place], [place] [address]   Lactic acid, plasma     Status: None   Collection Time: 02/15/22 12:05 PM  Result Value Ref Range   Lactic Acid, Venous 1.6 0.5 - 1.9 mmol/L    Comment: Performed at [facility], [address] [place], [place] [address]  Magnesium     Status: None   Collection Time: 02/15/22 12:05 PM  Result Value Ref Range   Magnesium 2.0 1.7 - 2.4 mg/dL    Comment: Performed at [facility], [address], [place], [place] [address]  Urinalysis, Routine w reflex microscopic -Urine, Clean Catch     Status: Abnormal   Collection Time: 02/15/22  1:45 PM  Result Value Ref Range   Color, Urine YELLOW YELLOW   APPearance CLEAR CLEAR   Specific Gravity, Urine 1.015 1.005 - 1.030   pH 5.5 5.0 - 8.0   Glucose, UA >1,000 (A) NEGATIVE mg/dL   Hgb urine dipstick NEGATIVE NEGATIVE   Bilirubin Urine NEGATIVE NEGATIVE   Ketones, ur NEGATIVE NEGATIVE mg/dL   Protein, ur TRACE (A) NEGATIVE mg/dL   Nitrite NEGATIVE NEGATIVE   Leukocytes,Ua NEGATIVE NEGATIVE   RBC / HPF 0-5 0 - 5 RBC/hpf   WBC, UA 0-5 0 - 5 WBC/hpf   Bacteria, UA NONE SEEN NONE SEEN   Squamous Epithelial / HPF 0-5 0 - 5 /HPF    Comment: Performed at [facility], [address] [place], [place] [address]   DG Chest 2 View  Result Date: 02/15/2022 CLINICAL DATA:  Cough. EXAM: CHEST - 2 VIEW COMPARISON:  Chest radiograph dated April 08, 2019 FINDINGS:  The heart size and mediastinal contours are within normal limits. Left lower lobe opacity with silhouetting of the left hemidiaphragm suggesting pneumonia. No appreciable pleural effusion suggesting. Thoracic spondylosis, no acute osseous abnormality. IMPRESSION: Left lower lobe opacity with silhouetting of the left hemidiaphragm concerning for  pneumonia. Follow-up examination to resolution is recommended. Electronically Signed   By: Keane Police D.O.   On: 02/15/2022 09:08    Pending Labs Unresulted Labs (From admission, onward)     Start     Ordered   02/16/22 0500  CBC with Differential/Platelet  Tomorrow morning,   R        02/15/22 1826   02/16/22 [id number]  Basic metabolic panel  Tomorrow morning,   R        02/15/22 1826   02/16/22 0500  Protime-INR  Tomorrow morning,   R        02/15/22 1826   02/16/22 0500  APTT  Tomorrow morning,   R        02/15/22 1826   02/15/22 1827  HIV Antibody (routine testing w rflx)  (HIV Antibody (Routine testing w reflex) panel)  Once,   R        02/15/22 1826   02/15/22 1827  Strep pneumoniae urinary antigen  (COPD / Pneumonia / Cellulitis / Lower Extremity Wound)  Once,   R        02/15/22 1826   02/15/22 1827  Legionella Pneumophila Serogp 1 Ur Ag  (COPD / Pneumonia / Cellulitis / Lower Extremity Wound)  Once,   R        02/15/22 1826   02/15/22 1827  Hemoglobin A1c  (Glycemic Control (SSI)  Q 4 Hours / Glycemic Control (SSI)  AC +/- HS)  Once,   R       Comments: To assess prior glycemic control    02/15/22 1826   02/15/22 0943  Blood Culture (routine x 2)  (Septic presentation on arrival (screening labs, nursing and treatment orders for obvious sepsis))  BLOOD CULTURE X 2,   STAT      02/15/22 0944            Vitals/Pain Today's Vitals   02/15/22 1615 02/15/22 1616 02/15/22 1617 02/15/22 1758  BP: (!) 102/54  (!) 102/54 102/70  Pulse: 81  80 81  Resp:   18 17  Temp:   (!) 101.6 F (38.7 C)   TempSrc:      SpO2: 95%  95% 98%  Weight:      Height:      PainSc:  4       Isolation Precautions No active isolations  Medications Medications  lactated ringers infusion ( Intravenous New Bag/Given 02/15/22 1446)  oxyCODONE-acetaminophen (PERCOCET/ROXICET) 5-325 MG per tablet 1 tablet (1 tablet Oral Not Given 02/15/22 1430)  cefTRIAXone (ROCEPHIN) 2 g in sodium chloride 0.9 %  100 mL IVPB (has no administration in time range)  azithromycin (ZITHROMAX) 500 mg in sodium chloride 0.9 % 250 mL IVPB (has no administration in time range)  insulin aspart (novoLOG) injection 0-15 Units (has no administration in time range)  insulin aspart (novoLOG) injection 0-5 Units (has no administration in time range)  acetaminophen (TYLENOL) tablet 650 mg (has no administration in time range)    Or  acetaminophen (TYLENOL) suppository 650 mg (has no administration in time range)  ALPRAZolam (XANAX) tablet 0.5 mg (has no administration in time range)  atorvastatin (LIPITOR) tablet 40 mg (has no administration in time range)  rivaroxaban (XARELTO) tablet 20 mg (has no administration in time range)  lactated ringers bolus 1,000 mL (0 mLs Intravenous Stopped 02/15/22 1020)  ondansetron (ZOFRAN) injection 4 mg (4 mg Intravenous Given 02/15/22 0908)  acetaminophen (TYLENOL) tablet 650 mg (650 mg Oral Given 02/15/22 0923)  cefTRIAXone (ROCEPHIN) 1 g in sodium chloride 0.9 % 100 mL IVPB (0 g Intravenous Stopped 02/15/22 1058)  azithromycin (ZITHROMAX) tablet 500 mg (500 mg Oral Given 02/15/22 1025)  sodium chloride 0.9 % bolus 2,000 mL (0 mLs Intravenous Stopped 02/15/22 1318)  lactated ringers bolus 1,000 mL (0 mLs Intravenous Stopped 02/15/22 1443)  oxyCODONE-acetaminophen (PERCOCET/ROXICET) 5-325 MG per tablet 1 tablet (1 tablet Oral Given 02/15/22 1409)  acetaminophen (TYLENOL) tablet 650 mg (650 mg Oral Given 02/15/22 1540)    Mobility walks     Focused Assessments     R Recommendations: See Admitting Provider Note  Report given to:   Additional Notes: Pt came from [facility] with c/o of N,V,D since Monday. A sepsis workup has already been done at [facility]. His 1st lactic was 1.6, 2nd lactic was 7.6. WBC is 15.1. Pt is being admitted for pneumonia. He is A&Ox4 and uses a urinal at bedside. He has a 20 G in L upper arm and a 20 G in the R hand. He has LR going at 150.

## 2022-02-15 NOTE — ED Triage Notes (Signed)
Pt arrives via [facility] as a transfer from [facility]. Pt reports n/v/d and fever that has been ongoing since Monday. [facility] after being [facility] with [facility] and sepsis.

## 2022-02-15 NOTE — H&P (Signed)
Date: 02/15/2022               Patient Name:  Ethan Pitts MRN: [id number]  DOB: [birth year] Age / Sex: 62 y.o., male   PCP: Jettie Booze, NP         Medical Service: Internal Medicine Teaching Service         Attending Physician: Dr. Aldine Contes, MD    First Contact: Gaylyn Rong, MD      Pager: Dorothea Ogle [phone]      Second Contact: Virl Axe, MD      Pager: Sandrea Hammond [phone]           After Hours (After 5p/  First Contact Pager: [phone]  weekends / holidays): Second Contact Pager: [phone]   SUBJECTIVE   Chief Complaint: Shortness of breath  History of Present Illness:  Ethan Pitts is a 62 year old male with past medical history HTN, T2DM, HLD, obesity status post gastric bypass, OSA, a flutter/A-fib, possible history of CHF presenting with dyspnea, fevers and chills.  He says that he woke up 2 days ago feeling terrible.  He was very sweaty with cold chills, shaking, and diarrhea, general malaise and myalgias.  He did not have any known sick contacts.  He said it since got progressively worse.  He started having shortness of breath yesterday which worsened and he also developed a productive cough.  Dyspnea was exertional and was elicited even by rolling over in bed.  Also had some chest pain which was associated with coughing fits or deep breathing, possibly exacerbated with exertional activity as well.  1 episode of emesis without any hematemesis.  His symptoms got better enough that he went to [place] ED.  Additionally, he endorses episodes where he is sitting in the chair and possibly syncopized so sore falls asleep and then wakes up couple hours later.  Unsure if he is she is falling asleep during these episodes.  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  He does endorse more daytime fatigue, but he is still compliant with his CPAP.  He does not have any known  history of narcolepsy.  He mentions that he did have a flutter ablation about 2.5 years ago but started having more episodes of A-fib about 4 months ago.  He is still anticoagulated with Xarelto and follows with cardiology and A-fib clinic.  He had previous workup for ischemic cardiomyopathy with prior left heart cath which showed no obstructive disease.  During that [facility] and subsequent echo in 2020, he had EF 40 to 50% and possible diastolic dysfunction, but on repeat echo in 2022, he had normal LV function and no diastolic dysfunction.  After presenting to [place] ED, he was noted to be hypotensive, febrile, requiring supplemental oxygen and in A-fib.  He additionally had initial leukocytosis of 15.1 and lactic acid of 7.6.  Chest x-ray showed left lobar pneumonia.  Blood cultures collected and he was started on antibiotics and had aggressive fluid resuscitation.  At this point, he was approved for ED to ED transfer to [facility]  Upon reaching [facility], repeat lactic acid was 1.6 and he was normotensive and no longer in A-fib.  IMTS consulted for admission  Meds:  Current Meds  Medication Sig   ALPRAZolam (XANAX) 0.5 MG tablet TAKE 1 TABLET BY MOUTH EVERY 8 HOURS AS NEEDED FOR SLEEP OR ANXIETY   amLODipine (NORVASC) 5 MG  tablet TAKE 1 TABLET BY MOUTH ONCE DAILY . APPOINTMENT REQUIRED FOR FUTURE REFILLS   atorvastatin (LIPITOR) 40 MG tablet Take 1 tablet by mouth once daily   benazepril (LOTENSIN) 40 MG tablet Take 1 tablet (40 mg total) by mouth daily. Please keep upcoming appt in January 2023 with Dr. Lovena Le before anymore refills. Thank you Final Attempt   carvedilol (COREG) 25 MG tablet Take 1 tablet (25 mg total) by mouth 2 (two) times daily.   dofetilide (TIKOSYN) 500 MCG capsule Take 1 capsule by mouth twice daily   furosemide (LASIX) 40 MG tablet Take 1 tablet (40 mg total) by mouth daily.   glipiZIDE (GLUCOTROL XL) 10 MG 24 hr tablet Take 1 tablet by mouth once daily with breakfast   JARDIANCE  25 MG TABS tablet Take 25 mg by mouth daily.   Magnesium Oxide 400 MG CAPS Take 1 capsule (400 mg total) by mouth 2 (two) times daily.   metFORMIN (GLUCOPHAGE) 1000 MG tablet TAKE 1 TABLET BY MOUTH TWICE DAILY WITH A MEAL   MOUNJARO 5 MG/0.5ML Pen SMARTSIG:5 Milligram(s) SUB-Q Once a Week   pregabalin (LYRICA) 150 MG capsule Take 1 capsule by mouth twice daily   spironolactone (ALDACTONE) 25 MG tablet Take 1 tablet by mouth once daily   tamsulosin (FLOMAX) 0.4 MG CAPS capsule Take 0.4 mg by mouth daily in the afternoon.    testosterone cypionate (DEPOTESTOSTERONE CYPIONATE) 200 MG/ML injection 1 ml IM q 14 days   XARELTO 20 MG TABS tablet TAKE 1 TABLET BY MOUTH ONCE DAILY WITH SUPPER   zaleplon (SONATA) 5 MG capsule TAKE 1 CAPSULE BY MOUTH AT BEDTIME AS NEEDED FOR SLEEP    Past Medical History  Past Surgical History:  Procedure Laterality Date   A-FLUTTER ABLATION N/A 07/09/2018   Procedure: A-FLUTTER ABLATION;  Surgeon: Evans Lance, MD;  Location: [facility];  Service: Cardiovascular;  Laterality: N/A;   ADENOIDECTOMY     BREATH TEK H PYLORI N/A 01/08/2013   Procedure: [place];  Surgeon: [name], MD;  Location: [facility];  Service: General;  Laterality: N/A;   CARDIAC CATHETERIZATION  03/2018   No signi obstructive CAD (suspected FALSE POSITIVE STRESS TEST).  EF 50%.  Diastolic dysfunction.   CARDIOVASCULAR STRESS TEST  03/2018   Intermediate risk; EF 40%, medium sized defect with peri-infarct ischemia-->follow up cath showed no signif obstructive CAD.   COLONOSCOPY  04/17/2014   Benign polyp x 1.  Recall 10 yrs.   GASTRIC ROUX-EN-Y N/A 04/14/2013   Procedure: LAPAROSCOPIC ROUX-EN-Y GASTRIC BYPASS WITH UPPER ENDOSCOPY;  Surgeon: [name], MD;  Location: [facility];  Service: General;  Laterality: N/A;   LEFT HEART CATH AND CORONARY ANGIOGRAPHY N/A 03/22/2018   NO CAD.  EF 50%. Procedure: LEFT HEART CATH AND CORONARY ANGIOGRAPHY;  Surgeon: Belva Crome, MD;  Location: [facility];  Service: Cardiovascular;  Laterality: N/A;   Rhythm monitoring  01/08/2019   7 day monitor->Sinus rhythm with occasional PAC, PVC, paroxysmal atrial flutter, paroxysmal atrial fibrillation and 7 beats nonsustained ventricular tachycardia: xarelto restarted   TONSILLECTOMY     TRANSTHORACIC ECHOCARDIOGRAM  05/13/2018;12/03/18   05/2018 severe hypokinesis of LV inferior and inferolateral walls, EF 40-45%, correlates with stress test.  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  He sees multiple cardiologists Substances: Never smoked cigarettes, drinks about a case of beer every week.  No other recreational substance use  Family History:  Family History  Problem Relation Age of Onset   Heart disease Father        valvular disease   COPD Father    Prostate cancer Father    Arrhythmia Father    Colon cancer Neg Hx      Allergies: Allergies as of 02/15/2022   (No Known Allergies)    Review of Systems: A complete ROS was negative except as per HPI.   OBJECTIVE:   Physical Exam: Blood pressure (!) 102/54, pulse 80, temperature (!) 101.6 F (38.7 C), resp. rate 18, height 6' 1"$  (1.854 m), weight 127 kg, SpO2 95 %.  Constitutional: NAD Cardiovascular: RRR, no murmurs, rubs or gallops Pulmonary/Chest: normal work of breathing on 2 L , diminished breath sounds on the left lung base.  No rhonchi or wheezing Abdominal: soft, non-tender, non-distended Extremities: warm, well perfused, extremity pulses 2+, no BLE pitting edema Skin: Palpable small pink purpuric skin  lesions across back  Labs:    Latest Ref Rng & Units 02/15/2022    9:00 AM 01/31/2020   11:25 AM 04/10/2019    1:42 PM 04/08/2019    6:34 PM 01/08/2019   10:05 AM 07/09/2018    8:46 AM 03/22/2018    8:02 AM  CBC EXTENDED  WBC 4.0 - 10.5 K/uL 15.1  5.1  4.8  5.9  4.6  4.2    RBC 4.22 - 5.81 MIL/uL 4.75  4.32  3.84  3.94  4.85  4.47    Hemoglobin 13.0 - 17.0 g/dL 14.7  14.2  9.9  10.4  14.3  13.0  13.6   HCT 39.0 - 52.0 % 43.0  39.7  33.1  33.9  43.0  39.7  40.0   Platelets 150 - 400 K/uL 142  160  181  228  173.0  136    NEUT# 1.7 - 7.7 K/uL 13.3  3.3   3.6      Lymph# 0.7 - 4.0 K/uL 0.5  1.2   1.5          Latest Ref Rng & Units 02/15/2022    9:00 AM 01/07/2021   12:00 AM 11/11/2020    9:31 AM 03/09/2020    8:53 AM 01/31/2020   11:25 AM 09/11/2019    9:42 AM 06/11/2019    3:30 PM  CMP  Glucose 70 - 99 mg/dL 269  270  229  185  117  162  129   BUN 6 - 20 mg/dL 38  21  31  17  15  16  13   $ Creatinine 0.61 - 1.24 mg/dL 1.55  1.23  1.41  0.94  0.89  0.91  1.24   Sodium 135 - 145 mmol/L 127  132  135  137  134  138  141   Potassium 3.5 - 5.1 mmol/L 4.0  5.0  5.2  3.4  3.9  4.0  4.2   Chloride 98 - 111 mmol/L 90  94  96  100  99  101  107   CO2 22 - 32 mmol/L 26  24  26  27  23  25  $ 25  Calcium 8.9 - 10.3 mg/dL 9.4  9.9  9.9  9.5  9.5  9.4  9.3   Total Protein 6.5 - 8.1 g/dL 7.6     7.4     Total Bilirubin 0.3 - 1.2 mg/dL 0.8     0.5     Alkaline Phos 38 - 126 U/L 53     48     AST 15 - 41 U/L 20     29     ALT 0 - 44 U/L 14     25     Lipase 20 Respiratory panel negative INR 2.1, PTT 52 Lactic acid 7.6> 1.6 BCx x 2 in process Mag 2.0 UA showing significant glucose, trace protein   Imaging: CXR: Lobar consolidation along left lower lobe.  No pleural effusion.  EKG: personally reviewed my interpretation is initial EKG A-fib, he has since spontaneously converted to normal sinus rhythm.  Was in NSR when we were assessing him.  ASSESSMENT & PLAN:   Ethan Pitts is a 61 y.o. male  with PMH HTN, T2DM, HLD, obesity status post gastric bypass, OSA, a flutter/A-fib, possible history of CHF who presented with diarrhea, dyspnea and admitted for left lobar pneumonia, initially septic on hospital day 0  #Sepsis secondary to left lobar pneumonia #Diarrhea #Generalized weakness Patient presents with 3 days of worsening diarrhea, dyspnea, generalized weakness, myalgias.  Was found to have left lobar pneumonia on initial chest x-ray.  Respiratory panel was negative.  He had initial significantly elevated lactic acidosis and was hypotensive with AKI at [place] ED and was transferred to [facility] for further management.  His blood pressures lactic acidosis improved after initial aggressive fluid resuscitation and he is stable on 2 L nasal cannula, weaned down to room air at time of assessment.  Initiated on CAP coverage.  Of note, he was initially hyponatremic and with diarrhea and other systemic symptoms with healthcare exposure, might be concerning for Legionella.  Strep pneumo urinary antigen also placed.  Given improvement on CAP coverage, do not think we need to cover Pseudomonas at this time, but can change to broaden coverage if needed. - Continue CAP coverage with ceftriaxone 2 g (for initial sepsis) daily for 5 days total and azithromycin 500 mg daily for 3 days total -If he continues fever and become hypotensive again on current regimen, would expand antibiotic coverage to broad-spectrum, recheck lactate - Tylenol as needed for fever - Continue LR 150 cc/h, can bolus if he becomes hypotensive again - Trend CBC, follow-up Legionella, strep pneumo urinary antigen.  Follow-up blood cultures -Supplemental O2 as needed to maintain sat greater than 92% - PT/OT -voltaren gel QID PRN for chest wall pain  # A flutter status post ablation #A-fib on Xarelto Initially in A-fib on presentation and spontaneously converted to normal sinus rhythm.  Follows with A-fib clinic and cardiology.  On  Coreg 25 mg twice daily and Tikosyn 500 mcg. Will consult cardiology for assistance with tikosyn. Holding coreg with initial hypotension. -Appreciate cardiology recs regarding tikosyn  #AKI Initial creatinine 1.5, baseline likely accidentally 1.  Most likely prerenal in the setting of emesis, diarrhea.  Will trend creatinine after fluid resuscitation. - Trend kidney function  #HTN #History of CHF Based on most recent echo, patient has normal LV function and no diastolic dysfunction.  He is still on Lasix and spironolactone as well as ACE, Jardiance 25.  Holding home antihypertensives and diuretics in the setting of initial sepsis.  His volume status is  stable on initial examination.  #T2DM On glipizide 10 mg daily, Jardiance 25 mg daily, metformin 1000 twice daily, Mounjaro 5 mg weekly at home.  Lyrica 150 twice daily for neuropathy.  CBGs initially elevated.  Will place on SSI initially while inpatient. -SSI, can add on long-acting insulin if needed while inpatient  #HLD - Continue Lipitor 40 mg daily  #OSA - CPAP nightly while inpatient   Diet: Carb-Modified VTE:  Xarelto IVF: LR, 150 cc/hr Code: Full  Prior to Admission Living Arrangement: Home, living with son Anticipated Discharge Location:  Pending PT/OT Barriers to Discharge: Clinical improvement/PT and OT eval  Dispo: Admit patient to Observation with expected length of stay less than 2 midnights.  Signed: Linus Galas, MD Internal Medicine Resident PGY-1  02/15/2022, 7:56 PM

## 2022-02-16 ENCOUNTER — Observation Stay (HOSPITAL_BASED_OUTPATIENT_CLINIC_OR_DEPARTMENT_OTHER): Payer: No Typology Code available for payment source

## 2022-02-16 DIAGNOSIS — E785 Hyperlipidemia, unspecified: Secondary | ICD-10-CM | POA: Diagnosis present

## 2022-02-16 DIAGNOSIS — R531 Weakness: Secondary | ICD-10-CM

## 2022-02-16 DIAGNOSIS — Z79899 Other long term (current) drug therapy: Secondary | ICD-10-CM | POA: Diagnosis not present

## 2022-02-16 DIAGNOSIS — J189 Pneumonia, unspecified organism: Secondary | ICD-10-CM | POA: Diagnosis present

## 2022-02-16 DIAGNOSIS — I447 Left bundle-branch block, unspecified: Secondary | ICD-10-CM | POA: Diagnosis present

## 2022-02-16 DIAGNOSIS — I4892 Unspecified atrial flutter: Secondary | ICD-10-CM | POA: Diagnosis present

## 2022-02-16 DIAGNOSIS — R6520 Severe sepsis without septic shock: Secondary | ICD-10-CM | POA: Diagnosis present

## 2022-02-16 DIAGNOSIS — Z1152 Encounter for screening for COVID-19: Secondary | ICD-10-CM | POA: Diagnosis not present

## 2022-02-16 DIAGNOSIS — R197 Diarrhea, unspecified: Secondary | ICD-10-CM

## 2022-02-16 DIAGNOSIS — Z9884 Bariatric surgery status: Secondary | ICD-10-CM | POA: Diagnosis not present

## 2022-02-16 DIAGNOSIS — E860 Dehydration: Secondary | ICD-10-CM | POA: Diagnosis present

## 2022-02-16 DIAGNOSIS — I5031 Acute diastolic (congestive) heart failure: Secondary | ICD-10-CM | POA: Diagnosis not present

## 2022-02-16 DIAGNOSIS — A419 Sepsis, unspecified organism: Secondary | ICD-10-CM | POA: Diagnosis present

## 2022-02-16 DIAGNOSIS — J9601 Acute respiratory failure with hypoxia: Secondary | ICD-10-CM | POA: Diagnosis present

## 2022-02-16 DIAGNOSIS — F419 Anxiety disorder, unspecified: Secondary | ICD-10-CM | POA: Diagnosis present

## 2022-02-16 DIAGNOSIS — J181 Lobar pneumonia, unspecified organism: Secondary | ICD-10-CM | POA: Diagnosis not present

## 2022-02-16 DIAGNOSIS — E872 Acidosis, unspecified: Secondary | ICD-10-CM | POA: Diagnosis present

## 2022-02-16 DIAGNOSIS — E876 Hypokalemia: Secondary | ICD-10-CM | POA: Diagnosis not present

## 2022-02-16 DIAGNOSIS — I110 Hypertensive heart disease with heart failure: Secondary | ICD-10-CM | POA: Diagnosis present

## 2022-02-16 DIAGNOSIS — I5032 Chronic diastolic (congestive) heart failure: Secondary | ICD-10-CM | POA: Diagnosis present

## 2022-02-16 DIAGNOSIS — I480 Paroxysmal atrial fibrillation: Secondary | ICD-10-CM | POA: Diagnosis present

## 2022-02-16 DIAGNOSIS — N179 Acute kidney failure, unspecified: Secondary | ICD-10-CM | POA: Diagnosis present

## 2022-02-16 DIAGNOSIS — G4733 Obstructive sleep apnea (adult) (pediatric): Secondary | ICD-10-CM | POA: Diagnosis present

## 2022-02-16 DIAGNOSIS — E871 Hypo-osmolality and hyponatremia: Secondary | ICD-10-CM | POA: Diagnosis present

## 2022-02-16 DIAGNOSIS — L989 Disorder of the skin and subcutaneous tissue, unspecified: Secondary | ICD-10-CM | POA: Diagnosis present

## 2022-02-16 DIAGNOSIS — Z87891 Personal history of nicotine dependence: Secondary | ICD-10-CM | POA: Diagnosis not present

## 2022-02-16 DIAGNOSIS — E1140 Type 2 diabetes mellitus with diabetic neuropathy, unspecified: Secondary | ICD-10-CM | POA: Diagnosis present

## 2022-02-16 DIAGNOSIS — Z7984 Long term (current) use of oral hypoglycemic drugs: Secondary | ICD-10-CM | POA: Diagnosis not present

## 2022-02-16 DIAGNOSIS — Z7901 Long term (current) use of anticoagulants: Secondary | ICD-10-CM | POA: Diagnosis not present

## 2022-02-16 LAB — BASIC METABOLIC PANEL
Anion gap: 11 (ref 5–15)
BUN: 17 mg/dL (ref 6–20)
CO2: 24 mmol/L (ref 22–32)
Calcium: 8.4 mg/dL — ABNORMAL LOW (ref 8.9–10.3)
Chloride: 99 mmol/L (ref 98–111)
Creatinine, Ser: 1.07 mg/dL (ref 0.61–1.24)
GFR, Estimated: >60 mL/min (ref >60)
Glucose, Bld: 105 mg/dL — ABNORMAL HIGH (ref 70–99)
Potassium: 3.2 mmol/L — ABNORMAL LOW (ref 3.5–5.1)
Sodium: 134 mmol/L — ABNORMAL LOW (ref 135–145)

## 2022-02-16 LAB — ECHOCARDIOGRAM COMPLETE
Area-P 1/2: 5.46 cm2
Height: 73 in
S' Lateral: 5.1 cm
Single Plane A4C EF: 28.7 %
Weight: 4480 oz

## 2022-02-16 LAB — CBC WITH DIFFERENTIAL/PLATELET
Abs Immature Granulocytes: 0.08 10*3/uL — ABNORMAL HIGH (ref 0.00–0.07)
Basophils Absolute: 0.1 10*3/uL (ref 0.0–0.1)
Basophils Relative: 1 %
Eosinophils Absolute: 0 10*3/uL (ref 0.0–0.5)
Eosinophils Relative: 0 %
HCT: 35.3 % — ABNORMAL LOW (ref 39.0–52.0)
Hemoglobin: 12.2 g/dL — ABNORMAL LOW (ref 13.0–17.0)
Immature Granulocytes: 1 %
Lymphocytes Relative: 10 %
Lymphs Abs: 1 10*3/uL (ref 0.7–4.0)
MCH: 32 pg (ref 26.0–34.0)
MCHC: 34.6 g/dL (ref 30.0–36.0)
MCV: 92.7 fL (ref 80.0–100.0)
Monocytes Absolute: 1.1 10*3/uL — ABNORMAL HIGH (ref 0.1–1.0)
Monocytes Relative: 11 %
Neutro Abs: 7.7 10*3/uL (ref 1.7–7.7)
Neutrophils Relative %: 77 %
Platelets: 113 10*3/uL — ABNORMAL LOW (ref 150–400)
RBC: 3.81 MIL/uL — ABNORMAL LOW (ref 4.22–5.81)
RDW: 13.8 % (ref 11.5–15.5)
WBC: 10 10*3/uL (ref 4.0–10.5)
nRBC: 0 % (ref 0.0–0.2)

## 2022-02-16 LAB — GLUCOSE, CAPILLARY
Glucose-Capillary: 131 mg/dL — ABNORMAL HIGH (ref 70–99)
Glucose-Capillary: 156 mg/dL — ABNORMAL HIGH (ref 70–99)
Glucose-Capillary: 186 mg/dL — ABNORMAL HIGH (ref 70–99)
Glucose-Capillary: 188 mg/dL — ABNORMAL HIGH (ref 70–99)
Glucose-Capillary: 199 mg/dL — ABNORMAL HIGH (ref 70–99)
Glucose-Capillary: 203 mg/dL — ABNORMAL HIGH (ref 70–99)

## 2022-02-16 LAB — CBG MONITORING, ED: Glucose-Capillary: 221 mg/dL — ABNORMAL HIGH (ref 70–99)

## 2022-02-16 MED ORDER — LIDOCAINE 5 % EX PTCH
1.0000 | MEDICATED_PATCH | Freq: Every day | CUTANEOUS | Status: DC | PRN
  Administered 2022-02-16 – 2022-02-17 (×2): 1 via TRANSDERMAL
  Filled 2022-02-16 (×2): qty 1

## 2022-02-16 MED ORDER — PERFLUTREN LIPID MICROSPHERE
1.0000 mL | INTRAVENOUS | Status: AC | PRN
  Administered 2022-02-16: 2 mL via INTRAVENOUS

## 2022-02-16 MED ORDER — POTASSIUM CHLORIDE CRYS ER 20 MEQ PO TBCR
40.0000 meq | EXTENDED_RELEASE_TABLET | Freq: Two times a day (BID) | ORAL | Status: DC
  Administered 2022-02-16 – 2022-02-17 (×3): 40 meq via ORAL
  Filled 2022-02-16 (×3): qty 2

## 2022-02-16 NOTE — Evaluation (Signed)
Physical Therapy Evaluation & Discharge Patient Details Name: Ethan Pitts MRN: [id number] DOB: 1960-05-02 Today's Date: 02/16/2022  History of Present Illness  Pt is a 62 y.o. male who presented 02/15/22 with nausea, vomiting, and diarrhea. Pt admitted with sepsis secondary to left lobar pneumonia. PMH: HTN, DM2, HLD, obesity s/p gastric bypass, OSA, a-flutter/afib, CHF, Peyronie's disease   Clinical Impression  Pt presents with condition above. Pt is functioning at his baseline, independently mobilizing without overt LOB, UE support, or need for assistance. Pt just demonstrates some mild SOB with activity. His SpO2 remained >/= 90% on RA at rest, dropped briefly to 88% on RA with static standing but quickly rebounded, and remained >/= 91% on RA while ambulating. Educated pt on frequent mobility, monitoring SpO2 with a pulse ox, and IS utilization and provided IS. He verbalized understanding of all education. All education completed and questions answered. PT will sign off.     Recommendations for follow up therapy are one component of a multi-disciplinary discharge planning process, led by the attending physician.  Recommendations may be updated based on patient status, additional functional criteria and insurance authorization.  Follow Up Recommendations No PT follow up      Assistance Recommended at Discharge None  Patient can return home with the following       Equipment Recommendations None recommended by PT  Recommendations for Other Services       Functional Status Assessment Patient has not had a recent decline in their functional status     Precautions / Restrictions Precautions Precautions: Other (comment) Precaution Comments: watch SpO2 Restrictions Weight Bearing Restrictions: No      Mobility  Bed Mobility               General bed mobility comments: Pt sitting in recliner upon arrival.    Transfers Overall transfer level:  Independent Equipment used: None               General transfer comment: Able to stand without LOB or need for assistance    Ambulation/Gait Ambulation/Gait assistance: Independent Gait Distance (Feet): 540 Feet Assistive device: None Gait Pattern/deviations: WFL(Within Functional Limits) Gait velocity: WFL Gait velocity interpretation: >4.37 ft/sec, indicative of normal walking speed   General Gait Details: Pt with steady gait and no significant gait deviations. No LOB, dragging O2 tank in holder throughout.  Stairs            Wheelchair Mobility    Modified Rankin (Stroke Patients Only)       Balance Overall balance assessment: No apparent balance deficits (not formally assessed)                                           Pertinent Vitals/Pain Pain Assessment Pain Assessment: Faces Faces Pain Scale: Hurts little more Pain Location: L side of abdomen Pain Descriptors / Indicators: Discomfort, Grimacing Pain Intervention(s): Limited activity within patient's tolerance, Monitored during session    Home Living Family/patient expects to be discharged to:: Private residence Living Arrangements: Children (74 y.o.)   Type of Home: House Home Access: Stairs to enter   Technical brewer of Steps: 1   Home Layout: One level Home Equipment: None      Prior Function Prior Level of Function : Independent/Modified Independent;Working/employed;Driving             Mobility Comments: No AD.  Hand Dominance        Extremity/Trunk Assessment   Upper Extremity Assessment Upper Extremity Assessment: Overall WFL for tasks assessed    Lower Extremity Assessment Lower Extremity Assessment: Overall WFL for tasks assessed    Cervical / Trunk Assessment Cervical / Trunk Assessment: Normal  Communication   Communication: No difficulties  Cognition Arousal/Alertness: Awake/alert Behavior During Therapy: WFL for tasks  assessed/performed Overall Cognitive Status: Within Functional Limits for tasks assessed                                          General Comments General comments (skin integrity, edema, etc.): SpO2 >/= 90% on RA at rest, dropped briefly to 88% on RA with static standing but quickly rebounded, >/= 91% on RA while ambulating; educated pt on IS utilization and provided IS; encouraged frequent mobility to improve endurance and to have a pulse ox to monitor his SpO2; he verbalized understanding of all education    Exercises Other Exercises Other Exercises: educated pt on use/frequency of IS provided   Assessment/Plan    PT Assessment Patient does not need any further PT services  PT Problem List         PT Treatment Interventions      PT Goals (Current goals can be found in the Care Plan section)  Acute Rehab PT Goals Patient Stated Goal: to feel better PT Goal Formulation: All assessment and education complete, DC therapy Time For Goal Achievement: 02/17/22 Potential to Achieve Goals: Good    Frequency       Co-evaluation               AM-PAC PT "6 Clicks" Mobility  Outcome Measure Help needed turning from your back to your side while in a flat bed without using bedrails?: None Help needed moving from lying on your back to sitting on the side of a flat bed without using bedrails?: None Help needed moving to and from a bed to a chair (including a wheelchair)?: None Help needed standing up from a chair using your arms (e.g., wheelchair or bedside chair)?: None Help needed to walk in hospital room?: None Help needed climbing 3-5 steps with a railing? : None 6 Click Score: 24    End of Session   Activity Tolerance: Patient tolerated treatment well Patient left: in chair;with call bell/phone within reach   PT Visit Diagnosis: Other (comment) (SOB)    Time: [id number] PT Time Calculation (min) (ACUTE ONLY): 17 min   Charges:   PT Evaluation $PT  Eval Low Complexity: 1 Low          Moishe Spice, PT, DPT Acute Rehabilitation Services  Office: [phone]   Orvan Falconer 02/16/2022, 2:54 PM

## 2022-02-16 NOTE — Care Management (Signed)
  Transition of Care [facility]) Screening Note   Patient Details  Name: Ethan Pitts Date of Birth: May 05, 1960   Transition of [facility]) CM/SW Contact:    Levonne Lapping, RN Phone Number: 02/16/2022, 9:26 AM    Transition of Care Department [facility]) has reviewed patient . Currently on IV ABX (recommended for 5 days) We will continue to monitor patient advancement through interdisciplinary progression rounds. If new patient transition needs arise, please place a TOC consult.

## 2022-02-16 NOTE — Progress Notes (Signed)
OT Cancellation Note  Patient Details Name: BEAVER TAUB MRN: [id number] DOB: Jan 29, 1960   Cancelled Treatment:    Reason Eval/Treat Not Completed: OT screened, no needs identified, will sign off (Discussed with PT who states pt is independent)  Orly Quimby,HILLARY 02/16/2022, 2:56 PM Maurie Boettcher, OT/L   Acute OT Clinical Specialist Acute Rehabilitation Services Pager [phone] Office [phone]

## 2022-02-16 NOTE — Progress Notes (Signed)
Internal Medicine Attending:   I saw and examined the patient. I reviewed the resident's H&P note and I agree with the resident's findings and plan as documented in the resident's note.  In brief, patient is a 62 year old male with a possible history of hypertension, type 2 diabetes, hyperlipidemia, obesity status post gastric bypass, OSA on CPAP, A-fib/a flutter, heart failure with recovered ejection fraction who presented to the ED with progressively worsening shortness of breath, fever/chills for 2 days.  Patient states approximately 2 days ago he developed shaking chills associated with generalized malaise and fatigue as well as myalgias and associated diarrhea.  Patient also complained of shortness of breath which progressively worsened associated with a productive cough.  Patient became dyspneic with minimal activity.  Patient presented to the ED secondary to worsening symptoms and was noted to be hypotensive, febrile, hypoxic requiring supplemental O2 and was in A-fib and was referred to our ED from [place] for further evaluation.  Today, patient is much improved but complains of some left-sided chest pain with deep inspiration and coughing.  Vitals:   02/16/22 1400 02/16/22 1439  BP:    Pulse: 65 78  Resp:    Temp:    SpO2: 91% 92%   On exam, patient is lying in bed in no apparent distress.  Lung exam reveals left lower lobe crackles, no wheezes noted.  Cardiovascular exam reveals regular rate and rhythm with normal heart sounds.  Abdomen is soft, nontender, nondistended with normoactive bowel sounds.  Lower extremities are nontender to palpation with no edema noted.  Patient is oriented x 3 and has a normal mood and affect.  Assessment and plan:  1.  Sepsis secondary to left lower lobe pneumonia with associated acute hypoxic respiratory failure: -Patient presented to ED with progressively worsening shortness of breath, productive cough and diarrhea with generalized weakness and was  found to have a left lower lobe pneumonia on imaging.  Patient also noted to have significant lactic acidosis up to the sevens and was hypotensive with systolic blood pressures in the 80s and febrile with fevers up to 101.6 F.  Patient was also noted to be hypoxic with O2 sats in the 80s requiring supplemental O2. -Will continue with ceftriaxone and azithromycin for now -Will follow-up urinary Legionella antigen given hyponatremia and diarrhea -Patient blood pressures have now improved with systolics in the 0000000 to [id number] -Lactic acidosis has now resolved. -Patient is now tolerating oral intake.  Will DC IV fluids and monitor patient closely -Strep pneumo antigen was negative -Patient noted to have left-sided chest pain today which is pleuritic in nature and is likely secondary to his underlying pneumonia.  Will attempt pain control with Voltaren gel.  If this does not work we will consider lidocaine patch. -Blood cultures with no growth to date -PT/OT to evaluate patient -No further workup at this time  2.  A-fib/flutter: -Patient initially noted to be in A-fib on presentation with spontaneously converted to normal sinus rhythm -Given patient's borderline blood pressures his carvedilol was held.  Will continue with Tikosyn for now -Patient remains in normal sinus rhythm with heart rates in the 70s.  No further workup for now  3.  AKI: -Patient was noted to have an elevated creatinine up to 1.5 on admission.  This is likely secondary to dehydration in setting of fevers and decreased oral intake -Patient's creatinine has now normalized after receiving IV fluids -No further workup at this time -Of note, patient was also noted to be hyponatremic  with a presenting sodium of 127.  I suspect this likely hypovolemic hyponatremia with improvement of his sodium now to 134 after fluid resuscitation

## 2022-02-16 NOTE — Progress Notes (Signed)
SATURATION QUALIFICATIONS: (This note is used to comply with regulatory documentation for home oxygen)  Patient Saturations on Room Air at Rest >/= 90%  Patient Saturations on Room Air while Ambulating = 88% briefly when standing statically but recovered quickly, remained >/= 91% on RA when ambulating    Please briefly explain why patient needs home oxygen: Pt does not appear to require supplemental O2 at home as he was able to recover his SpO2 to the 90s% quickly whenever it would briefly drop down to as low as 88%, seemingly only when standing statically on occasion. It remained >/= 91% on RA while ambulating.   Moishe Spice, PT, DPT Acute Rehabilitation Services  Office: [phone]

## 2022-02-16 NOTE — Progress Notes (Signed)
Subjective:   Summary: Ethan Pitts is a 62 y.o. year old male currently admitted on the IMTS HD#0 for pneumonia.  Overnight Events: NAEON.  He remains afebrile and hemodynamically stable on current antibiotic regimen.  Oxygenating well on room air.  Remains in normal sinus rhythm  Still feels well today at time of assessment.  Has some chest wall pain on the right which worsens with coughing or deep breathing, but otherwise no significant chest pain.  No dyspnea.  He remains comfortable on room air.  No abdominal pain, dysuria, nausea or vomiting no fevers or chills.  He is tolerating p.o. intake.  Discussed treatment plan and he is amenable at this time.  Objective:  Vital signs in last 24 hours: Vitals:   02/15/22 2107 02/16/22 0003 02/16/22 0100 02/16/22 0308  BP: 90/65 121/72  106/61  Pulse: 83 80  75  Resp: 18 20  17  $ Temp: (!) 100.8 F (38.2 C) 99.3 F (37.4 C)  99 F (37.2 C)  TempSrc: Oral Oral  Oral  SpO2: 91% 91% 99% 91%  Weight: 127 kg     Height: 6' 1"$  (1.854 m)      Supplemental O2: Room Air    Physical Exam:  Constitutional: NAD Cardiovascular: RRR, no murmurs, rubs or gallops Pulmonary/Chest: normal work of breathing on 2 L [place], diminished breath sounds on the left lung base.  No rhonchi or wheezing Abdominal: soft, non-tender, non-distended Extremities: warm, well perfused, extremity pulses 2+, no BLE pitting edema Skin: Palpable small pink purpuric skin lesions across back  [facility] Weights   02/15/22 0822 02/15/22 2107  Weight: 127 kg 127 kg     Intake/Output Summary (Last 24 hours) at 02/16/2022 0744 Last data filed at 02/15/2022 1443 Gross per 24 hour  Intake 5139.21 ml  Output --  Net 5139.21 ml   Net IO Since Admission: 5,139.21 mL [02/16/22 0744]  Pertinent Labs:    Latest Ref Rng & Units 02/16/2022    6:01 AM 02/15/2022    9:00 AM 01/31/2020   11:25 AM  CBC  WBC 4.0 - 10.5 K/uL 10.0  15.1  5.1   Hemoglobin 13.0 -  17.0 g/dL 12.2  14.7  14.2   Hematocrit 39.0 - 52.0 % 35.3  43.0  39.7   Platelets 150 - 400 K/uL 113  142  160        Latest Ref Rng & Units 02/15/2022    9:00 AM 01/07/2021   12:00 AM 11/11/2020    9:31 AM  CMP  Glucose 70 - 99 mg/dL 269  270  229   BUN 6 - 20 mg/dL 38  21  31   Creatinine 0.61 - 1.24 mg/dL 1.55  1.23  1.41   Sodium 135 - 145 mmol/L 127  132  135   Potassium 3.5 - 5.1 mmol/L 4.0  5.0  5.2   Chloride 98 - 111 mmol/L 90  94  96   CO2 22 - 32 mmol/L 26  24  26   $ Calcium 8.9 - 10.3 mg/dL 9.4  9.9  9.9   Total Protein 6.5 - 8.1 g/dL 7.6     Total Bilirubin 0.3 - 1.2 mg/dL 0.8     Alkaline Phos 38 - 126 U/L 53     AST 15 - 41 U/L 20     ALT 0 - 44 U/L 14  Strep pneumo urine antigen negative HIV nonreactive UA unremarkable A1c 6.3 Legionella pending Imaging: DG Chest 2 View  Result Date: 02/15/2022 CLINICAL DATA:  Cough. EXAM: CHEST - 2 VIEW COMPARISON:  Chest radiograph dated April 08, 2019 FINDINGS: The heart size and mediastinal contours are within normal limits. Left lower lobe opacity with silhouetting of the left hemidiaphragm suggesting pneumonia. No appreciable pleural effusion suggesting. Thoracic spondylosis, no acute osseous abnormality. IMPRESSION: Left lower lobe opacity with silhouetting of the left hemidiaphragm concerning for pneumonia. Follow-up examination to resolution is recommended. Electronically Signed   By: Keane Police D.O.   On: 02/15/2022 09:08     EKG: personally reviewed my interpretation is initial EKG A-fib, he has since spontaneously converted to normal sinus rhythm.  Was in NSR when we were assessing him.   Assessment/Plan:   Principal Problem:   Sepsis due to pneumonia [facility])   Patient Summary: Ethan Pitts is a 62 y.o. male with PMH HTN, T2DM, HLD, obesity status post gastric bypass, OSA, a flutter/A-fib, possible history of CHF who presented with diarrhea, dyspnea and admitted for left lobar pneumonia, initially  septic on hospital day 0   #Sepsis secondary to left lobar pneumonia #Diarrhea #Generalized weakness He remained afebrile and hemodynamically stable overnight.  Oxygenating well on room air.  Leukocytosis improved today.  Cultures preliminarily negative.  He remains afebrile on current antibiotic regimen.   - Continue CAP coverage with ceftriaxone 2 g (for initial sepsis) daily (day 2/5) and azithromycin 500 mg daily (day 2/3) -If he continues fever and become hypotensive again on current regimen, would expand antibiotic coverage to broad-spectrum, recheck lactate - Tylenol as needed for fever -DC fluids in the setting of normotension.  Will see if he can sustain his blood pressures with p.o. intake - Trend CBC, follow-up Legionella -Supplemental O2 as needed to maintain sat greater than 92% - PT/OT -voltaren gel QID PRN, lidocaine patch daily as needed for chest wall pain   # A flutter status post ablation #A-fib on Xarelto Initially in A-fib on presentation and spontaneously converted to normal sinus rhythm.  Follows with A-fib clinic and cardiology.  On Coreg 25 mg twice daily and Tikosyn 500 mcg.  Continued Tikosyn, held Coreg after discussing with cardiology yesterday.  Repeat EKG shows normal sinus rhythm.  Slightly hypokalemic today, will replete. - Continue Tikosyn 500 mcg twice daily.  Holding Coreg in the setting of acute infection and initial hypotension - Maintain K>4, mag>2   #AKI (resolved) Initial creatinine 1.5, baseline likely 1. Most likely prerenal in the setting of emesis, diarrhea.  AKI resolved today with creatinine of 1.07. - Trend kidney function   #HTN #History of CHF Based on most recent echo, patient has normal LV function and no diastolic dysfunction.  He is still on Lasix and spironolactone as well as ACE, Jardiance 25.  Holding home antihypertensives and diuretics in the setting of initial sepsis.  His volume status is stable. Echo poor study even with  Definity used, difficult to assess LVEF, though by estimation is 50 to 55% with LV dilation.  Patient can follow-up with cardiology outpatient following recovery   #T2DM On glipizide 10 mg daily, Jardiance 25 mg daily, metformin 1000 twice daily, Mounjaro 5 mg weekly at home.  Lyrica 150 twice daily for neuropathy.  CBGs initially elevated.  Will place on SSI initially while inpatient.  CBGs adequately managed -SSI, can add on long-acting insulin if needed while inpatient   #HLD - Continue Lipitor 40  mg daily   #OSA - CPAP nightly while inpatient   Diet: Carb-Modified IVF: None, VTE:  Xarelto Code: Full PT/OT recs: Pending TOC recs:  Family Update:   Dispo: Anticipated discharge to home pending clinical improvement, PT/OT eval.  Linus Galas, MD PGY-1 Internal Medicine Resident Please contact the on call pager after 5 pm and on weekends at [phone].

## 2022-02-16 NOTE — Plan of Care (Signed)

## 2022-02-17 ENCOUNTER — Ambulatory Visit: Payer: Commercial Managed Care - PPO | Admitting: Physician Assistant

## 2022-02-17 ENCOUNTER — Other Ambulatory Visit (HOSPITAL_COMMUNITY): Payer: Self-pay

## 2022-02-17 DIAGNOSIS — J189 Pneumonia, unspecified organism: Secondary | ICD-10-CM

## 2022-02-17 DIAGNOSIS — Z87891 Personal history of nicotine dependence: Secondary | ICD-10-CM | POA: Diagnosis not present

## 2022-02-17 DIAGNOSIS — A419 Sepsis, unspecified organism: Secondary | ICD-10-CM | POA: Diagnosis not present

## 2022-02-17 LAB — GLUCOSE, CAPILLARY
Glucose-Capillary: 141 mg/dL — ABNORMAL HIGH (ref 70–99)
Glucose-Capillary: 172 mg/dL — ABNORMAL HIGH (ref 70–99)

## 2022-02-17 LAB — CBC
HCT: 37.1 % — ABNORMAL LOW (ref 39.0–52.0)
Hemoglobin: 12.3 g/dL — ABNORMAL LOW (ref 13.0–17.0)
MCH: 31.8 pg (ref 26.0–34.0)
MCHC: 33.2 g/dL (ref 30.0–36.0)
MCV: 95.9 fL (ref 80.0–100.0)
Platelets: 126 10*3/uL — ABNORMAL LOW (ref 150–400)
RBC: 3.87 MIL/uL — ABNORMAL LOW (ref 4.22–5.81)
RDW: 13.9 % (ref 11.5–15.5)
WBC: 4.2 10*3/uL (ref 4.0–10.5)
nRBC: 0 % (ref 0.0–0.2)

## 2022-02-17 LAB — BASIC METABOLIC PANEL
Anion gap: 10 (ref 5–15)
BUN: 15 mg/dL (ref 6–20)
CO2: 27 mmol/L (ref 22–32)
Calcium: 9.1 mg/dL (ref 8.9–10.3)
Chloride: 101 mmol/L (ref 98–111)
Creatinine, Ser: 0.9 mg/dL (ref 0.61–1.24)
GFR, Estimated: >60 mL/min (ref >60)
Glucose, Bld: 127 mg/dL — ABNORMAL HIGH (ref 70–99)
Potassium: 3.8 mmol/L (ref 3.5–5.1)
Sodium: 138 mmol/L (ref 135–145)

## 2022-02-17 LAB — LEGIONELLA PNEUMOPHILA SEROGP 1 UR AG: L. pneumophila Serogp 1 Ur Ag: NEGATIVE

## 2022-02-17 MED ORDER — OXYCODONE-ACETAMINOPHEN 5-325 MG PO TABS
1.0000 | ORAL_TABLET | Freq: Three times a day (TID) | ORAL | 0 refills | Status: DC | PRN
  Filled 2022-02-17: qty 10, 4d supply, fill #0

## 2022-02-17 MED ORDER — ALPRAZOLAM 0.5 MG PO TABS
0.5000 mg | ORAL_TABLET | Freq: Every evening | ORAL | 0 refills | Status: AC | PRN
  Filled 2022-02-17: qty 30, 30d supply, fill #0

## 2022-02-17 MED ORDER — CEFPODOXIME PROXETIL 200 MG PO TABS
200.0000 mg | ORAL_TABLET | Freq: Two times a day (BID) | ORAL | 0 refills | Status: AC
  Filled 2022-02-17: qty 4, 2d supply, fill #0

## 2022-02-17 MED ORDER — LIDOCAINE 5 % EX PTCH
1.0000 | MEDICATED_PATCH | Freq: Every day | CUTANEOUS | 0 refills | Status: DC | PRN
  Filled 2022-02-17: qty 14, 14d supply, fill #0

## 2022-02-17 NOTE — Progress Notes (Signed)
Mobility Specialist Progress Note   02/17/22 1138  Mobility  Activity Ambulated with assistance in hallway  Level of Assistance Standby assist, set-up cues, supervision of patient - no hands on  Assistive Device Other (Comment) (IV pole)  Distance Ambulated (ft) 500 ft  Activity Response Tolerated well  Mobility Referral Yes  $Mobility charge 1 Mobility   Pre Mobility: 62 HR, 134/95 BP, 93% SpO2 During Mobility: 90 HR, 89% SpO2 Post Mobility: 68 HR, 144/78 91%, SpO2  Received in bed c/o L chest pain but agreeable. Ambulating in hallway w/ steady gait and no faults. Breif drops to 89% SpO2 but recover quickly while walking. Returned back to bed w/ needs met and call bell in reach.     Holland Falling Mobility Specialist Please contact via SecureChat or  Rehab office at [phone]

## 2022-02-17 NOTE — Progress Notes (Incomplete)
Subjective:   Summary: Ethan Pitts is a 62 y.o. year old male currently admitted on the IMTS HD#1 for pneumonia.  Overnight Events: No PT/OT follow-up needed, no supplemental oxygen needed at discharge.  Remains afebrile, HD stable overnight  Objective:  Vital signs in last 24 hours: Vitals:   02/16/22 1605 02/16/22 1744 02/16/22 2145 02/16/22 2311  BP:   113/66 131/78  Pulse: 68 67 68 68  Resp: 20 (!) 23 17 19  $ Temp:   98 F (36.7 C) 98.1 F (36.7 C)  TempSrc:   Oral Oral  SpO2: 93% 91% 92% 94%  Weight:      Height:       Supplemental O2: Room Air    Physical Exam:  Constitutional: NAD Cardiovascular: RRR, no murmurs, rubs or gallops Pulmonary/Chest: normal work of breathing on 2 L [place], diminished breath sounds on the left lung base.  No rhonchi or wheezing Abdominal: soft, non-tender, non-distended Extremities: warm, well perfused, extremity pulses 2+, no BLE pitting edema Skin: Palpable small pink purpuric skin lesions across back  [facility] Weights   02/15/22 0822 02/15/22 2107  Weight: 127 kg 127 kg     Intake/Output Summary (Last 24 hours) at 02/17/2022 0701 Last data filed at 02/16/2022 1553 Gross per 24 hour  Intake 200 ml  Output --  Net 200 ml    Net IO Since Admission: 5,339.21 mL [02/17/22 0701]  Pertinent Labs:    Latest Ref Rng & Units 02/16/2022    6:01 AM 02/15/2022    9:00 AM 01/31/2020   11:25 AM  CBC  WBC 4.0 - 10.5 K/uL 10.0  15.1  5.1   Hemoglobin 13.0 - 17.0 g/dL 12.2  14.7  14.2   Hematocrit 39.0 - 52.0 % 35.3  43.0  39.7   Platelets 150 - 400 K/uL 113  142  160        Latest Ref Rng & Units 02/16/2022    6:01 AM 02/15/2022    9:00 AM 01/07/2021   12:00 AM  CMP  Glucose 70 - 99 mg/dL 105  269  270   BUN 6 - 20 mg/dL 17  38  21   Creatinine 0.61 - 1.24 mg/dL 1.07  1.55  1.23   Sodium 135 - 145 mmol/L 134  127  132   Potassium 3.5 - 5.1 mmol/L 3.2  4.0  5.0   Chloride 98 - 111 mmol/L 99  90  94   CO2 22 -  32 mmol/L 24  26  24   $ Calcium 8.9 - 10.3 mg/dL 8.4  9.4  9.9   Total Protein 6.5 - 8.1 g/dL  7.6    Total Bilirubin 0.3 - 1.2 mg/dL  0.8    Alkaline Phos 38 - 126 U/L  53    AST 15 - 41 U/L  20    ALT 0 - 44 U/L  14      Legionella pending Imaging: ECHOCARDIOGRAM COMPLETE  Result Date: 02/16/2022    ECHOCARDIOGRAM REPORT   Patient Name:   Ethan Pitts Date of Exam: 02/16/2022 Medical Rec #:  [id number]            Height:       73.0 in Accession #:    [id number]           Weight:       280.0 lb Date of Birth:  02-04-1960           BSA:          2.482 m Patient Age:    61 years             BP:           118/68 mmHg Patient Gender: M                    HR:           73 bpm. Exam Location:  Inpatient Procedure: 2D Echo, Cardiac Doppler, Color Doppler and Intracardiac            Opacification Agent Indications:    CHF-Acute Diastolic [id number]  History:        Patient has prior history of Echocardiogram examinations, most                 recent 11/11/2020. Cardiomyopathy, Arrythmias:Atrial Flutter;                 Risk Factors:Hypertension, Diabetes and Dyslipidemia. Pneumonia.  Sonographer:    Darlina Sicilian RDCS Referring Phys: [id number] [facility] NARENDRA  Sonographer Comments: Suboptimal apical window and suboptimal parasternal window. IMPRESSIONS  1. Extremely poor acoustic windows limit study, even with Definity use. Difficult to assess LVEF or to compare to previous echoes. . Left ventricular ejection fraction, by estimation, is 50 to 55%. The left ventricle has low normal function. The left ventricular internal cavity size was severely dilated. Left ventricular diastolic parameters were normal.  2. Right ventricular systolic function is low normal. The right ventricular size is normal.  3. No evidence of mitral valve regurgitation. No evidence of mitral stenosis.  4. The aortic valve was not well visualized. Aortic valve regurgitation is not visualized. No aortic stenosis is present. FINDINGS  Left  Ventricle: Extremely poor acoustic windows limit study, even with Definity use. Difficult to assess LVEF or to compare to previous echoes. Left ventricular ejection fraction, by estimation, is 50 to 55%. The left ventricle has low normal function. Definity contrast agent was given IV to delineate the left ventricular endocardial borders. The left ventricular internal cavity size was severely dilated. There is no left ventricular hypertrophy. Left ventricular diastolic parameters were normal. Right Ventricle: The right ventricular size is normal. No increase in right ventricular wall thickness. Right ventricular systolic function is low normal. Left Atrium: Left atrial size was not well visualized. Right Atrium: Right atrial size was normal in size. Pericardium: There is no evidence of pericardial effusion. Mitral Valve: There is mild thickening of the mitral valve leaflet(s). Mild to moderate mitral annular calcification. No evidence of mitral valve regurgitation. No evidence of mitral valve stenosis. Tricuspid Valve: The tricuspid valve is normal in structure. Tricuspid valve regurgitation is trivial. No evidence of tricuspid stenosis. Aortic Valve: The aortic valve was not well visualized. Aortic valve regurgitation is not visualized. No aortic stenosis is present. Pulmonic Valve: The pulmonic valve was not well visualized. Pulmonic valve regurgitation is not visualized. No evidence of pulmonic stenosis. Aorta: The aortic root and ascending aorta are structurally normal, with no evidence of dilitation. IAS/Shunts: No atrial level shunt detected by color flow Doppler.  LEFT VENTRICLE PLAX 2D LVIDd:         6.40 cm      Diastology LVIDs:         5.10 cm      LV e' medial:    7.94 cm/s LV PW:  1.10 cm      LV E/e' medial:  13.7 LV IVS:        1.20 cm      LV e' lateral:   10.30 cm/s LVOT diam:     2.50 cm      LV E/e' lateral: 10.6 LV SV:         81 LV SV Index:   32 LVOT Area:     4.91 cm  LV Volumes (MOD)  LV vol d, MOD A4C: 187.0 ml LV vol s, MOD A4C: 133.3 ml LV SV MOD A4C:     187.0 ml RIGHT VENTRICLE RV S prime:     16.10 cm/s TAPSE (M-mode): 2.5 cm LEFT ATRIUM             Index        RIGHT ATRIUM           Index LA diam:        5.20 cm 2.09 cm/m   RA Area:     17.50 cm LA Vol (A2C):   65.8 ml 26.51 ml/m  RA Volume:   48.40 ml  19.50 ml/m LA Vol (A4C):   33.3 ml 13.41 ml/m LA Biplane Vol: 48.6 ml 19.58 ml/m  AORTIC VALVE LVOT Vmax:   80.50 cm/s LVOT Vmean:  59.400 cm/s LVOT VTI:    0.164 m  AORTA Ao Root diam: 3.30 cm Ao Asc diam:  3.50 cm MITRAL VALVE MV Area (PHT): 5.46 cm     SHUNTS MV Decel Time: 139 msec     Systemic VTI:  0.16 m MV E velocity: 109.00 cm/s  Systemic Diam: 2.50 cm MV A velocity: 77.60 cm/s MV E/A ratio:  1.40 Dorris Carnes MD Electronically signed by Dorris Carnes MD Signature Date/Time: 02/16/2022/2:58:18 PM    Final      EKG: personally reviewed my interpretation is initial EKG A-fib, he has since spontaneously converted to normal sinus rhythm.  Was in NSR when we were assessing him.   Assessment/Plan:   Principal Problem:   Sepsis due to pneumonia [facility])   Patient Summary: Ethan Pitts is a 62 y.o. male with PMH HTN, T2DM, HLD, obesity status post gastric bypass, OSA, a flutter/A-fib, possible history of CHF who presented with diarrhea, dyspnea and admitted for left lobar pneumonia, initially septic on hospital day 0   #Sepsis secondary to left lobar pneumonia #Diarrhea #Generalized weakness He remained afebrile and hemodynamically stable overnight.  Oxygenating well on room air.  Leukocytosis improved today.  Cultures preliminarily negative.  He remains afebrile on current antibiotic regimen.   - Continue CAP coverage with ceftriaxone 2 g (for initial sepsis) daily (day 3/5) and azithromycin 500 mg daily (day 3/3) -If he continues fever and become hypotensive again on current regimen, would expand antibiotic coverage to broad-spectrum, recheck lactate - Tylenol  as needed for fever -DC fluids in the setting of normotension.  Will see if he can sustain his blood pressures with p.o. intake - Trend CBC, follow-up Legionella -Supplemental O2 as needed to maintain sat greater than 92% - PT/OT -voltaren gel QID PRN, lidocaine patch daily as needed for chest wall pain   # A flutter status post ablation #A-fib on Xarelto Initially in A-fib on presentation and spontaneously converted to normal sinus rhythm.  Follows with A-fib clinic and cardiology.  On Coreg 25 mg twice daily and Tikosyn 500 mcg.  Continued Tikosyn, held Coreg after discussing with cardiology yesterday.  Repeat EKG shows normal sinus rhythm.  Slightly hypokalemic today, will replete. - Continue Tikosyn 500 mcg twice daily.  Holding Coreg in the setting of acute infection and initial hypotension - Maintain K>4, mag>2   #AKI (resolved) Initial creatinine 1.5, baseline likely 1. Most likely prerenal in the setting of emesis, diarrhea.  AKI resolved today with creatinine of 1.07. - Trend kidney function   #HTN #History of CHF Based on most recent echo, patient has normal LV function and no diastolic dysfunction.  He is still on Lasix and spironolactone as well as ACE, Jardiance 25.  Holding home antihypertensives and diuretics in the setting of initial sepsis.  His volume status is stable. Echo poor study even with Definity used, difficult to assess LVEF, though by estimation is 50 to 55% with LV dilation.  Patient can follow-up with cardiology outpatient following recovery   #T2DM On glipizide 10 mg daily, Jardiance 25 mg daily, metformin 1000 twice daily, Mounjaro 5 mg weekly at home.  Lyrica 150 twice daily for neuropathy.  CBGs initially elevated.  Will place on SSI initially while inpatient.  CBGs adequately managed -SSI, can add on long-acting insulin if needed while inpatient   #HLD - Continue Lipitor 40 mg daily   #OSA - CPAP nightly while inpatient   Diet: Carb-Modified IVF:  None, VTE:  Xarelto Code: Full PT/OT recs: Pending TOC recs:  Family Update:   Dispo: Anticipated discharge to home pending clinical improvement, PT/OT eval.  Linus Galas, MD PGY-1 Internal Medicine Resident Please contact the on call pager after 5 pm and on weekends at [phone].

## 2022-02-17 NOTE — Discharge Summary (Signed)
Name: Ethan Pitts MRN: [id number] DOB: May 28, 1960 62 y.o. PCP: Jettie Booze, NP  Date of Admission: 02/15/2022  8:14 AM Date of Discharge: 2/16/20242/16/24 Attending Physician: Dr. Dareen Piano  Discharge Diagnosis: Principal Problem:   Sepsis due to pneumonia [facility])    Discharge Medications: Allergies as of 02/17/2022   No Known Allergies      Medication List     STOP taking these medications    benazepril 40 MG tablet Commonly known as: LOTENSIN   carvedilol 25 MG tablet Commonly known as: Coreg   furosemide 40 MG tablet Commonly known as: LASIX   spironolactone 25 MG tablet Commonly known as: ALDACTONE       TAKE these medications    ALPRAZolam 0.5 MG tablet Commonly known as: XANAX Take 1 tablet (0.5 mg total) by mouth at bedtime as needed for anxiety or sleep. What changed:  how much to take how to take this when to take this reasons to take this additional instructions   amLODipine 5 MG tablet Commonly known as: NORVASC TAKE 1 TABLET BY MOUTH ONCE DAILY . APPOINTMENT REQUIRED FOR FUTURE REFILLS   atorvastatin 40 MG tablet Commonly known as: LIPITOR Take 1 tablet by mouth once daily   cefpodoxime 200 MG tablet Commonly known as: VANTIN Take 1 tablet (200 mg total) by mouth 2 (two) times daily for 2 days. Start taking on: February 18, 2022   dofetilide 500 MCG capsule Commonly known as: TIKOSYN Take 1 capsule by mouth twice daily   glipiZIDE 10 MG 24 hr tablet Commonly known as: GLUCOTROL XL Take 1 tablet by mouth once daily with breakfast   Insulin Pen Needle 31G X 5 MM Misc Use with Victoza as directed   Jardiance 25 MG Tabs tablet Generic drug: empagliflozin Take 25 mg by mouth daily.   lidocaine 5 % Commonly known as: LIDODERM Place 1 patch onto the skin daily as needed. Remove & Discard patch within 12 hours or as directed by MD   Magnesium Oxide 400 MG Caps Take 1 capsule (400 mg total) by mouth 2 (two) times  daily.   metFORMIN 1000 MG tablet Commonly known as: GLUCOPHAGE TAKE 1 TABLET BY MOUTH TWICE DAILY WITH A MEAL   Mounjaro 5 MG/0.5ML Pen Generic drug: tirzepatide SMARTSIG:5 Milligram(s) SUB-Q Once a Week   oxyCODONE-acetaminophen 5-325 MG tablet Commonly known as: Percocet Take 1 tablet by mouth every 8 (eight) hours as needed for severe pain.   pregabalin 150 MG capsule Commonly known as: LYRICA Take 1 capsule by mouth twice daily   tamsulosin 0.4 MG Caps capsule Commonly known as: FLOMAX Take 0.4 mg by mouth daily in the afternoon.   testosterone cypionate 200 MG/ML injection Commonly known as: DEPOTESTOSTERONE CYPIONATE 1 ml IM q 14 days   Xarelto 20 MG Tabs tablet Generic drug: rivaroxaban TAKE 1 TABLET BY MOUTH ONCE DAILY WITH SUPPER   zaleplon 5 MG capsule Commonly known as: SONATA TAKE 1 CAPSULE BY MOUTH AT BEDTIME AS NEEDED FOR SLEEP        Disposition and follow-up:   Ethan Pitts was discharged from [facility] in Good condition.  At the hospital follow up visit please address:  1.  Follow-up:  a.Please assess for continued resolution of respiratory symptoms and improvement of pleuritic pain. He will finish his abx course after 2 more days of cefpodoxime.    b. Tikosyn was continued throughout admission and he knows to continue taking doses on time after dc.  Resumed amlodipine and sglt2 at dc, will continue to hold benazepril, coreg, diuretics. Please resume at follow up as appropriate.   C. Please further assess skin lesions across back if needed  2.  Labs / imaging needed at time of follow-up: bmp  3.  Pending labs/ test needing follow-up: final bcx  Follow-up Appointments:  Follow-up Information     Jettie Booze, NP. Go on 02/21/2022.   Specialty: Family Medicine Contact information: [place] [address] [place] [facility]. Go on 03/02/2022.    Specialty: Cardiology Contact information: [address] [place] [id number] [address] [place] Hospital Course by problem list: Ethan Pitts is a 62 y.o. male with PMH HTN, T2DM, HLD, obesity status post gastric bypass, OSA, a flutter/A-fib, possible history of CHF who presented with diarrhea, dyspnea and admitted for left lobar pneumonia, initially septic on hospital day 0   #Sepsis secondary to left lobar pneumonia #Diarrhea #Generalized weakness Patient presents with 3 days of worsening diarrhea, dyspnea, generalized weakness, myalgias.  Was found to have left lobar pneumonia on initial chest x-ray.  Respiratory panel was negative.  He had initial significantly elevated lactic acidosis and was hypotensive with AKI at [place] ED and was transferred to [facility] for further management.  His blood pressures lactic acidosis improved after initial aggressive fluid resuscitation and he was quickly weaned to RA at initial assessment.  Initiated on CAP coverage. PT/OT ordered after initial stabilization and O2 sats remained stable without supplemental o2 during ambulation, no PT/OT follow up needed. Pleuritic pain treated appropriately while inpatient. Legionella and Strep pneumo testing negative and bcx remained negative while inpatient. He was stable on RA, afebrile, HD stable on day of dc and will complete abx course with cefpodoxime in the outpatient setting with follow up arranged.   # A flutter status post ablation #A-fib on Xarelto Initially in A-fib on presentation and spontaneously converted to normal sinus rhythm.  Follows with A-fib clinic and cardiology.  On Coreg 25 mg twice daily and Tikosyn 500 mcg.  Continued Tikosyn, held Coreg after discussing with cardiology on day of admission.  Repeat EKG shows normal sinus rhythm.  Tikosyn and xarelto continued through admission without missed doses and electrolytes repleted as  appropriate. Coreg held on dc and he has PCP and cardiology/EP follow up arranged.   #AKI (resolved) Initial creatinine 1.5, baseline likely 1. Most likely prerenal in the setting of emesis, diarrhea.  AKI resolved after initial fluids with creatinine of 1.07.   #HTN #History of CHF Based on most recent echo in 2022, patient has normal LV function and no diastolic dysfunction, but he has previously had EF [id number] and diastolic dysfunction.  He is on Lasix and spironolactone as well as ACE, coreg as above, and Jardiance 25.  Held home antihypertensives and diuretics in the setting of initial sepsis.  Repeated echo once stable. Echo poor study even with Definity used, difficult to assess LVEF, though by estimation is 50 to 55% with LV dilation. Continued to hold ACEi, BB, diuretics on day of dc, jardiance restarted. Can resume as appropriate during cardiology follow up.   #T2DM On glipizide 10 mg daily, Jardiance 25 mg daily, metformin 1000 twice daily, Mounjaro 5 mg weekly at home.  Lyrica 150 twice daily for neuropathy.  CBGs adequately managed  on SSI inpatient.     #HLD - Continued Lipitor 40 mg daily   #OSA - CPAP nightly while inpatient  #Skin lesions Noted palpable small purpuric lesions across back on intiial exam. Follow up outpatient.  Subjective: Feels better this morning.Symptoms improved, no new acute concerns. Discussed management including abx and other medication regimen and he stated understanding. Amenable to dc today with work note.  Discharge Vitals:   BP (!) 144/78 (BP Location: Right Arm)   Pulse 70   Temp 97.8 F (36.6 C) (Oral)   Resp 20   Ht 6' 1"$  (1.854 m)   Wt 127 kg   SpO2 92%   BMI 36.94 kg/m  Discharge exam: Constitutional: NAD Cardiovascular: RRR, no murmurs, rubs or gallops Pulmonary/Chest: normal work of breathing on RA.  No rhonchi or wheezing Abdominal: soft, non-tender, non-distended Extremities: warm, well perfused, extremity pulses 2+, no BLE  pitting edema Skin: Palpable small pink purpuric skin lesions across back   Pertinent Labs, Studies, and Procedures:     Latest Ref Rng & Units 02/17/2022    6:21 AM 02/16/2022    6:01 AM 02/15/2022    9:00 AM  CBC  WBC 4.0 - 10.5 K/uL 4.2  10.0  15.1   Hemoglobin 13.0 - 17.0 g/dL 12.3  12.2  14.7   Hematocrit 39.0 - 52.0 % 37.1  35.3  43.0   Platelets 150 - 400 K/uL 126  113  142        Latest Ref Rng & Units 02/17/2022    6:21 AM 02/16/2022    6:01 AM 02/15/2022    9:00 AM  CMP  Glucose 70 - 99 mg/dL 127  105  269   BUN 6 - 20 mg/dL 15  17  38   Creatinine 0.61 - 1.24 mg/dL 0.90  1.07  1.55   Sodium 135 - 145 mmol/L 138  134  127   Potassium 3.5 - 5.1 mmol/L 3.8  3.2  4.0   Chloride 98 - 111 mmol/L 101  99  90   CO2 22 - 32 mmol/L 27  24  26   $ Calcium 8.9 - 10.3 mg/dL 9.1  8.4  9.4   Total Protein 6.5 - 8.1 g/dL   7.6   Total Bilirubin 0.3 - 1.2 mg/dL   0.8   Alkaline Phos 38 - 126 U/L   53   AST 15 - 41 U/L   20   ALT 0 - 44 U/L   14     ECHOCARDIOGRAM COMPLETE  Result Date: 02/16/2022    ECHOCARDIOGRAM REPORT   Patient Name:   Ethan Pitts Date of Exam: 02/16/2022 Medical Rec #:  [id number]            Height:       73.0 in Accession #:    [id number]           Weight:       280.0 lb Date of Birth:  1960-03-05           BSA:          2.482 m Patient Age:    61 years             BP:           118/68 mmHg Patient Gender: M                    HR:           73  bpm. Exam Location:  Inpatient Procedure: 2D Echo, Cardiac Doppler, Color Doppler and Intracardiac            Opacification Agent Indications:    CHF-Acute Diastolic [id number]  History:        Patient has prior history of Echocardiogram examinations, most                 recent 11/11/2020. Cardiomyopathy, Arrythmias:Atrial Flutter;                 Risk Factors:Hypertension, Diabetes and Dyslipidemia. Pneumonia.  Sonographer:    Darlina Sicilian RDCS Referring Phys: [id number] [facility] NARENDRA  Sonographer Comments: Suboptimal  apical window and suboptimal parasternal window. IMPRESSIONS  1. Extremely poor acoustic windows limit study, even with Definity use. Difficult to assess LVEF or to compare to previous echoes. . Left ventricular ejection fraction, by estimation, is 50 to 55%. The left ventricle has low normal function. The left ventricular internal cavity size was severely dilated. Left ventricular diastolic parameters were normal.  2. Right ventricular systolic function is low normal. The right ventricular size is normal.  3. No evidence of mitral valve regurgitation. No evidence of mitral stenosis.  4. The aortic valve was not well visualized. Aortic valve regurgitation is not visualized. No aortic stenosis is present. FINDINGS  Left Ventricle: Extremely poor acoustic windows limit study, even with Definity use. Difficult to assess LVEF or to compare to previous echoes. Left ventricular ejection fraction, by estimation, is 50 to 55%. The left ventricle has low normal function. Definity contrast agent was given IV to delineate the left ventricular endocardial borders. The left ventricular internal cavity size was severely dilated. There is no left ventricular hypertrophy. Left ventricular diastolic parameters were normal. Right Ventricle: The right ventricular size is normal. No increase in right ventricular wall thickness. Right ventricular systolic function is low normal. Left Atrium: Left atrial size was not well visualized. Right Atrium: Right atrial size was normal in size. Pericardium: There is no evidence of pericardial effusion. Mitral Valve: There is mild thickening of the mitral valve leaflet(s). Mild to moderate mitral annular calcification. No evidence of mitral valve regurgitation. No evidence of mitral valve stenosis. Tricuspid Valve: The tricuspid valve is normal in structure. Tricuspid valve regurgitation is trivial. No evidence of tricuspid stenosis. Aortic Valve: The aortic valve was not well visualized. Aortic  valve regurgitation is not visualized. No aortic stenosis is present. Pulmonic Valve: The pulmonic valve was not well visualized. Pulmonic valve regurgitation is not visualized. No evidence of pulmonic stenosis. Aorta: The aortic root and ascending aorta are structurally normal, with no evidence of dilitation. IAS/Shunts: No atrial level shunt detected by color flow Doppler.  LEFT VENTRICLE PLAX 2D LVIDd:         6.40 cm      Diastology LVIDs:         5.10 cm      LV e' medial:    7.94 cm/s LV PW:         1.10 cm      LV E/e' medial:  13.7 LV IVS:        1.20 cm      LV e' lateral:   10.30 cm/s LVOT diam:     2.50 cm      LV E/e' lateral: 10.6 LV SV:         81 LV SV Index:   32 LVOT Area:     4.91 cm  LV Volumes (MOD) LV vol d, MOD  A4C: 187.0 ml LV vol s, MOD A4C: 133.3 ml LV SV MOD A4C:     187.0 ml RIGHT VENTRICLE RV S prime:     16.10 cm/s TAPSE (M-mode): 2.5 cm LEFT ATRIUM             Index        RIGHT ATRIUM           Index LA diam:        5.20 cm 2.09 cm/m   RA Area:     17.50 cm LA Vol (A2C):   65.8 ml 26.51 ml/m  RA Volume:   48.40 ml  19.50 ml/m LA Vol (A4C):   33.3 ml 13.41 ml/m LA Biplane Vol: 48.6 ml 19.58 ml/m  AORTIC VALVE LVOT Vmax:   80.50 cm/s LVOT Vmean:  59.400 cm/s LVOT VTI:    0.164 m  AORTA Ao Root diam: 3.30 cm Ao Asc diam:  3.50 cm MITRAL VALVE MV Area (PHT): 5.46 cm     SHUNTS MV Decel Time: 139 msec     Systemic VTI:  0.16 m MV E velocity: 109.00 cm/s  Systemic Diam: 2.50 cm MV A velocity: 77.60 cm/s MV E/A ratio:  1.40 Dorris Carnes MD Electronically signed by Dorris Carnes MD Signature Date/Time: 02/16/2022/2:58:18 PM    Final    DG Chest 2 View  Result Date: 02/15/2022 CLINICAL DATA:  Cough. EXAM: CHEST - 2 VIEW COMPARISON:  Chest radiograph dated April 08, 2019 FINDINGS: The heart size and mediastinal contours are within normal limits. Left lower lobe opacity with silhouetting of the left hemidiaphragm suggesting pneumonia. No appreciable pleural effusion suggesting. Thoracic  spondylosis, no acute osseous abnormality. IMPRESSION: Left lower lobe opacity with silhouetting of the left hemidiaphragm concerning for pneumonia. Follow-up examination to resolution is recommended. Electronically Signed   By: Keane Police D.O.   On: 02/15/2022 09:08     Discharge Instructions: Discharge Instructions     (HEART FAILURE PATIENTS) Call MD:  Anytime you have any of the following symptoms: 1) 3 pound weight gain in 24 hours or 5 pounds in 1 week 2) shortness of breath, with or without a dry hacking cough 3) swelling in the hands, feet or stomach 4) if you have to sleep on extra pillows at night in order to breathe.   Complete by: As directed    Call MD for:  difficulty breathing, headache or visual disturbances   Complete by: As directed    Call MD for:  severe uncontrolled pain   Complete by: As directed    Call MD for:  temperature >100.4   Complete by: As directed    Diet - low sodium heart healthy   Complete by: As directed    Discharge instructions   Complete by: As directed    1.  Please take cefpodoxime 200 mg twice a day for 2 more days to complete your course of antibiotics.  2.  You can use lidocaine daily for your chest wall pain.  If you really need to, you can use the Percocet every 8 hours.  I am refilling your Xanax, but please make sure to follow-up with your primary doctor for anymore.  3.  Please continue taking your Tikosyn and you can also resume taking your amlodipine at discharge.  Please continue to hold your Coreg, benazepril, Lasix, spironolactone until you are able to see your primary doctor or your cardiologist.  4.  Please follow-up with your primary doctor and your cardiologist.  As  discussed, you have your PCP appointment on 2/20.  I set up a cardiology appointment for you on 2/29 and 3/22.   Increase activity slowly   Complete by: As directed        Discharge Instructions   None     Signed: Linus Galas, MD 02/17/2022, 10:19  PM   Pager: [phone]

## 2022-02-20 LAB — CULTURE, BLOOD (ROUTINE X 2)
Culture: NO GROWTH
Culture: NO GROWTH
Special Requests: ADEQUATE

## 2022-02-28 NOTE — Progress Notes (Signed)
Cardiology Clinic Note   Patient Name: Ethan Pitts Date of Encounter: 03/02/2022  Primary Care Provider:  April Manson, NP Primary Cardiologist:  Olga Millers, MD  Patient Profile    Ethan Pitts 62 year old male presents to the clinic today for follow-up evaluation of his nonischemic cardiomyopathy and atrial flutter/fib.  Past Medical History    Past Medical History:  Diagnosis Date   Anxiety    Arthritis    lower back    Atrial flutter (HCC) 2020   Cards->lopressor and anticoag, ablation. In sinus still as of 08/2018 cards f/u.  Recurrence + PAF->requiring pt to get back on xarelto   BPH with obstruction/lower urinary tract symptoms    started flomax via urol 03/2019   CAD (coronary artery disease) 03/2018   Ostial first diagonal 60% narrowed, otherwise normal coronaries. Dr. Jens Som added ASA and statin 08/2018.   Depression    Diabetes mellitus    False positive stress test 03/2018   Cath showed normal coronaries with EF 50%   GERD (gastroesophageal reflux disease)    Hyperlipidemia    Hypertension    Hypogonadism, male 05/2015   Increased prostate specific antigen (PSA) velocity 2019   06/2017 velocity up; recheck 10/2017 back to baseline.  Plan repeat 1 yr.   Low back pain 2016   Left lumbar radiculopathy, lumbar spondylolisthesis.  Sciatica episode 03/2016.  Ortho getting L spine MRI as of 07/2016.   Microcytic anemia 12/2015   suspect iron def due to malabsorption.  Hemoccults NEG 01/26/16, ferrous sulfate started.   Neuropathic pain of left foot    NICM (nonischemic cardiomyopathy) (HCC) 05/2018   EF 40-45%, +LV hypokinesis. Suspected to be tachycardia-mediated->Dr. Jens Som to rpt echo fall 2020.   OSA on CPAP    cpap settings at 3   Peyronie's disease 2021   Dr. Thomasenia Sales (collegenase intralesional injections)   Past Surgical History:  Procedure Laterality Date   A-FLUTTER ABLATION N/A 07/09/2018   Procedure: A-FLUTTER ABLATION;   Surgeon: Marinus Maw, MD;  Location: [facility] INVASIVE CV LAB;  Service: Cardiovascular;  Laterality: N/A;   ADENOIDECTOMY     BREATH TEK H PYLORI N/A 01/08/2013   Procedure: BREATH TEK H PYLORI;  Surgeon: Valarie Merino, MD;  Location: [facility];  Service: General;  Laterality: N/A;   CARDIAC CATHETERIZATION  03/2018   No signi obstructive CAD (suspected FALSE POSITIVE STRESS TEST).  EF 50%.  Diastolic dysfunction.   CARDIOVASCULAR STRESS TEST  03/2018   Intermediate risk; EF 40%, medium sized defect with peri-infarct ischemia-->follow up cath showed no signif obstructive CAD.   COLONOSCOPY  04/17/2014   Benign polyp x 1.  Recall 10 yrs.   GASTRIC ROUX-EN-Y N/A 04/14/2013   Procedure: LAPAROSCOPIC ROUX-EN-Y GASTRIC BYPASS WITH UPPER ENDOSCOPY;  Surgeon: Valarie Merino, MD;  Location: [facility] ORS;  Service: General;  Laterality: N/A;   LEFT HEART CATH AND CORONARY ANGIOGRAPHY N/A 03/22/2018   NO CAD.  EF 50%. Procedure: LEFT HEART CATH AND CORONARY ANGIOGRAPHY;  Surgeon: Lyn Records, MD;  Location: [facility] INVASIVE CV LAB;  Service: Cardiovascular;  Laterality: N/A;   Rhythm monitoring  01/08/2019   7 day monitor->Sinus rhythm with occasional PAC, PVC, paroxysmal atrial flutter, paroxysmal atrial fibrillation and 7 beats nonsustained ventricular tachycardia: xarelto restarted   TONSILLECTOMY     TRANSTHORACIC ECHOCARDIOGRAM  05/13/2018;12/03/18   05/2018 severe hypokinesis of LV inferior and inferolateral walls, EF 40-45%, correlates with stress test.  12/2018->EF 40-45%, diastolic fxn/LV filling  pressure not determined due to pt in a fib/flutter    Allergies  No Known Allergies  History of Present Illness    Ethan Pitts has a PMH of HTN, atrial flutter, paroxysmal atrial fibrillation, nonischemic cardiomyopathy, coronary disease, sepsis due to pneumonia, type 2 diabetes, degenerative joint disease, hyperlipidemia, obesity, insomnia, depression, and chronic anticoagulation.  He follows  with Clint Fenton, PA-C at the atrial fibrillation clinic.  He was seen in the emergency department on 02/15/2022.  He complained of 3 days of worsening diarrhea, dyspnea, and generalized weakness as well as myalgias.  He was found to have a left lobar pneumonia diagnosed with chest x-ray.  His respiratory panel was negative.  He was transferred to [facility] for further management.  He received IV fluids.  He received antibiotics.  His oxygen saturation remained stable without supplemental oxygen.  He noted some pleuritic discomfort.  He was negative for Legionella and strep pneumonia.  He continued to be stable on room air and afebrile.  He was given a course of antibiotics and outpatient follow-up was arranged.  He was initially noted to be in atrial fibrillation on presentation but converted to sinus rhythm.  He was continued on carvedilol and Tikosyn.    He presents to the clinic today for follow-up evaluation and states he has not had any further episodes of atrial fibrillation.  He continues to use his Apple Watch.  He reports he will be ready to return to work 03/07/2022.  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  We reviewed his recent hospitalization and he expressed understanding.  He denies bleeding issues.  He asks about Watchman device.  He reports that he has a appointment with Dr. Ladona Ridgel next month.  He will inquire about Watchman device at that time.  He had lab work drawn on the 20th which was stable.  Will plan follow-up in 4 to 6 months.  Today he denies chest pain, shortness of breath, lower extremity edema, fatigue, palpitations, melena, hematuria, hemoptysis, diaphoresis, weakness, presyncope, syncope, orthopnea, and PND.    Home Medications    Prior to Admission medications   Medication Sig Start Date End Date Taking? Authorizing Provider  ALPRAZolam Prudy Feeler) 0.5 MG tablet Take 1 tablet (0.5 mg total) by mouth at bedtime as needed for anxiety or sleep. 02/17/22   Earl Lagos, MD   amLODipine (NORVASC) 5 MG tablet TAKE 1 TABLET BY MOUTH ONCE DAILY . APPOINTMENT REQUIRED FOR FUTURE REFILLS 03/16/21   Marinus Maw, MD  atorvastatin (LIPITOR) 40 MG tablet Take 1 tablet by mouth once daily 09/01/19   Lewayne Bunting, MD  dofetilide (TIKOSYN) 500 MCG capsule Take 1 capsule by mouth twice daily 09/06/21   Fenton, Clint R, PA  glipiZIDE (GLUCOTROL XL) 10 MG 24 hr tablet Take 1 tablet by mouth once daily with breakfast 05/23/19   McGowen, Maryjean Morn, MD  Insulin Pen Needle 31G X 5 MM MISC Use with Victoza as directed 01/15/18   McGowen, Maryjean Morn, MD  JARDIANCE 25 MG TABS tablet Take 25 mg by mouth daily. 11/24/20   [provider]  lidocaine (LIDODERM) 5 % Place 1 patch onto the skin daily as needed. Remove & Discard patch within 12 hours or as directed by MD 02/17/22   Lyndle Herrlich, MD  Magnesium Oxide 400 MG CAPS Take 1 capsule (400 mg total) by mouth 2 (two) times daily. 09/27/20   Fenton, Clint R, PA  metFORMIN (GLUCOPHAGE) 1000 MG tablet TAKE 1 TABLET BY MOUTH  TWICE DAILY WITH A MEAL 02/19/19   McGowen, Maryjean Morn, MD  [facility] 5 MG/0.5ML Pen SMARTSIG:5 Milligram(s) SUB-Q Once a Week 06/13/21   [provider]  oxyCODONE-acetaminophen (PERCOCET) 5-325 MG tablet Take 1 tablet by mouth every 8 (eight) hours as needed for severe pain. 02/17/22 02/17/23  Earl Lagos, MD  pregabalin (LYRICA) 150 MG capsule Take 1 capsule by mouth twice daily 02/10/19   McGowen, Maryjean Morn, MD  tamsulosin (FLOMAX) 0.4 MG CAPS capsule Take 0.4 mg by mouth daily in the afternoon.     [provider]  testosterone cypionate (DEPOTESTOSTERONE CYPIONATE) 200 MG/ML injection 1 ml IM q 14 days 01/08/19   McGowen, Maryjean Morn, MD  XARELTO 20 MG TABS tablet TAKE 1 TABLET BY MOUTH ONCE DAILY WITH SUPPER 08/29/21   Fenton, Clint R, PA  zaleplon (SONATA) 5 MG capsule TAKE 1 CAPSULE BY MOUTH AT BEDTIME AS NEEDED FOR SLEEP 02/19/19   McGowen, Maryjean Morn, MD    Family History    Family History   Problem Relation Age of Onset   Heart disease Father        valvular disease   COPD Father    Prostate cancer Father    Arrhythmia Father    Colon cancer Neg Hx    He indicated that his mother is alive. He indicated that his father is alive. He indicated that his brother is alive. He indicated that the status of his neg hx is unknown.  Social History    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EMT    Employer: [employer]  Tobacco Use   Smoking status: Never   Smokeless tobacco: Former    Types: Chew    Quit date: 02/06/2010   Tobacco comments:    Former Chew 07/07/21  Substance and Sexual Activity   Alcohol use: Yes    Alcohol/week: 18.0 standard drinks of alcohol    Types: 18 Cans of beer per week    Comment: 2-3 beers daily 07/07/21   Drug use: No   Sexual activity: Yes  Other Topics Concern   Not on file  Social History Narrative   Married, no children.   Occupation: paramedic   Social Determinants of Health   Financial Resource Strain: Not on file  Food Insecurity: No Food Insecurity (02/15/2022)   Hunger Vital Sign    Worried About Running Out of Food in the Last Year: Never true    Ran Out of Food in the Last Year: Never true  Transportation Needs: No Transportation Needs (02/15/2022)   PRAPARE - Administrator, Civil Service (Medical): No    Lack of Transportation (Non-Medical): No  Physical Activity: Not on file  Stress: Not on file  Social Connections: Not on file  Intimate Partner Violence: Not At Risk (02/15/2022)   Humiliation, Afraid, Rape, and Kick questionnaire    Fear of Current or Ex-Partner: No    Emotionally Abused: No    Physically Abused: No    Sexually Abused: No     Review of Systems    General:  No chills,  fever, night sweats or weight changes.  Cardiovascular:  No chest pain, dyspnea on exertion, edema, orthopnea, palpitations, paroxysmal nocturnal dyspnea. Dermatological: No rash,  lesions/masses Respiratory: No cough, dyspnea Urologic: No hematuria, dysuria Abdominal:   No nausea, vomiting, diarrhea, bright red blood per rectum, melena, or hematemesis Neurologic:  No visual changes, wkns, changes in mental status. All other systems reviewed and are otherwise negative except as noted above.  Physical Exam    VS:  BP 108/68   Pulse 76   Ht 6\' 1"  (1.854 m)   Wt 276 lb (125.2 kg)   SpO2 95%   BMI 36.41 kg/m  , BMI Body mass index is 36.41 kg/m. GEN: Well nourished, well developed, in no acute distress. HEENT: normal. Neck: Supple, no JVD, carotid bruits, or masses. Cardiac: RRR, no murmurs, rubs, or gallops. No clubbing, cyanosis, edema.  Radials/DP/PT 2+ and equal bilaterally.  Respiratory:  Respirations regular and unlabored, clear to auscultation bilaterally. GI: Soft, nontender, nondistended, BS + x 4. MS: no deformity or atrophy. Skin: warm and dry, no rash. Neuro:  Strength and sensation are intact. Psych: Normal affect.  Accessory Clinical Findings    Recent Labs: 02/15/2022: ALT 14; Magnesium 2.0 02/17/2022: BUN 15; Creatinine, Ser 0.90; Hemoglobin 12.3; Platelets 126; Potassium 3.8; Sodium 138   Recent Lipid Panel    Component Value Date/Time   CHOL 90 01/08/2019 1005   TRIG 132.0 01/08/2019 1005   HDL 31.10 (L) 01/08/2019 1005   CHOLHDL 3 01/08/2019 1005   VLDL 26.4 01/08/2019 1005   LDLCALC 32 01/08/2019 1005   LDLCALC 39 02/23/2017 1636   LDLDIRECT 75.0 06/23/2016 0941         ECG personally reviewed by me today-none today.  Echocardiogram 02/16/2022 IMPRESSIONS     1. Extremely poor acoustic windows limit study, even with Definity use.  Difficult to assess LVEF or to compare to previous echoes. . Left  ventricular ejection fraction, by estimation, is  50 to 55%. The left  ventricle has low normal function. The left  ventricular internal cavity size was severely dilated. Left ventricular  diastolic parameters were normal.   2. Right ventricular systolic function is low normal. The right  ventricular size is normal.   3. No evidence of mitral valve regurgitation. No evidence of mitral  stenosis.   4. The aortic valve was not well visualized. Aortic valve regurgitation  is not visualized. No aortic stenosis is present.   FINDINGS   Left Ventricle: Extremely poor acoustic windows limit study, even with  Definity use. Difficult to assess LVEF or to compare to previous echoes.  Left ventricular ejection fraction, by estimation, is 50 to 55%. The left  ventricle has low normal function.  Definity contrast agent was given IV to delineate the left ventricular  endocardial borders. The left ventricular internal cavity size was  severely dilated. There is no left ventricular hypertrophy. Left  ventricular diastolic parameters were normal.   Right Ventricle: The right ventricular size is normal. No increase in  right ventricular wall thickness. Right ventricular systolic function is  low normal.   Left Atrium: Left atrial size was not well visualized.   Right Atrium: Right atrial size was normal in size.   Pericardium: There is no evidence of pericardial effusion.   Mitral Valve: There is mild thickening of the mitral valve leaflet(s).  Mild to moderate mitral annular calcification. No evidence of mitral valve  regurgitation. No evidence of mitral valve stenosis.   Tricuspid Valve: The tricuspid valve is normal in structure. Tricuspid  valve regurgitation is trivial. No evidence of tricuspid stenosis.   Aortic Valve: The aortic valve was not  well visualized. Aortic valve  regurgitation is not visualized. No aortic stenosis is present.   Pulmonic Valve: The pulmonic valve was not well visualized. Pulmonic valve  regurgitation is not  visualized. No evidence of pulmonic stenosis.   Aorta: The aortic root and ascending aorta are structurally normal, with  no evidence of dilitation.   IAS/Shunts: No atrial level shunt detected by color flow Doppler.     Assessment & Plan   1.  Atrial flutter-status post ablation 7/20.  Compliant with Xarelto.  Denies bleeding issues.  Denies recent episodes of accelerated or irregular heartbeat.  Recently in the emergency department and noted to have atrial fibrillation in the setting of acute illness.  He spontaneously converted to sinus rhythm.  CHA2DS2-VASc score 4.  Is interested in discussing Watchman device with the EP. Continue carvedilol, Tikosyn, Xarelto Avoid triggers caffeine, chocolate, EtOH, dehydration etc.  Essential hypertension-BP today 108/68 Continue carvedilol, amlodipine Heart healthy low-sodium diet-salty 6 given Increase physical activity as tolerated  Nonischemic cardiomyopathy-no increased DOE or activity intolerance.  Echocardiogram showed EF of 60 to 65%.  Weight stable.  Euvolemic.  Well compensated post pneumonia. Continue/resume current medical therapy  Type 2 diabetes-glucose 127 on 02/17/2022. Continue metformin Follows with PCP  Disposition: Follow-up with Dr. Jens Som in 4 to 6 months.   Thomasene Ripple. Roran Wegner NP-C     03/02/2022, 11:10 AM [facility] [address] Office [phone] Fax [phone]    I spent 14 minutes examining this patient, reviewing medications, and using patient centered shared decision making involving her cardiac care.  Prior to her visit I spent greater than 20 minutes reviewing her past medical history,  medications, and prior cardiac tests.

## 2022-03-02 ENCOUNTER — Ambulatory Visit: Payer: Commercial Managed Care - PPO | Attending: Cardiology | Admitting: General Practice

## 2022-03-02 ENCOUNTER — Encounter: Payer: Self-pay | Admitting: General Practice

## 2022-03-02 VITALS — BP 108/68 | HR 76 | Ht 73.0 in | Wt 276.0 lb

## 2022-03-02 DIAGNOSIS — I10 Essential (primary) hypertension: Secondary | ICD-10-CM | POA: Diagnosis not present

## 2022-03-02 DIAGNOSIS — Z794 Long term (current) use of insulin: Secondary | ICD-10-CM

## 2022-03-02 DIAGNOSIS — E119 Type 2 diabetes mellitus without complications: Secondary | ICD-10-CM

## 2022-03-02 DIAGNOSIS — I4892 Unspecified atrial flutter: Secondary | ICD-10-CM | POA: Diagnosis not present

## 2022-03-02 DIAGNOSIS — I428 Other cardiomyopathies: Secondary | ICD-10-CM | POA: Diagnosis not present

## 2022-03-02 NOTE — Patient Instructions (Addendum)
Medication Instructions:  The current medical regimen is effective;  continue present plan and medications as directed. Please refer to the Current Medication list given to you today.  *If you need a refill on your cardiac medications before your next appointment, please call your pharmacy*  Lab Work: NONE If you have labs (blood work) drawn today and your tests are completely normal, you will receive your results only by: [place] (if you have MyChart) OR  A paper copy in the mail If you have any lab test that is abnormal or we need to change your treatment, we will call you to review the results.  Testing/Procedures: NONE  Follow-Up: At [facility], you and your health needs are our priority.  As part of our continuing mission to provide you with exceptional heart care, we have created designated Provider Care Teams.  These Care Teams include your primary Cardiologist (physician) and Advanced Practice Providers (APPs -  Physician Assistants and Nurse Practitioners) who all work together to provide you with the care you need, when you need it.  Your next appointment:   4-6 month(s)  Provider:   Kirk Ruths, MD [place] UP WITH [name], MD AS WELL     Other Instructions Please try to avoid these triggers: Do not use any products that have nicotine or tobacco in them. These include cigarettes, e-cigarettes, and chewing tobacco. If you need help quitting, ask your doctor. Eat heart-healthy foods. Talk with your doctor about the right eating plan for you. Exercise regularly as told by your doctor. Stay hydrated Do not drink alcohol, Caffeine or chocolate. Lose weight if you are overweight. Do not use drugs, including cannabis

## 2022-03-08 ENCOUNTER — Other Ambulatory Visit (HOSPITAL_COMMUNITY): Payer: Self-pay | Admitting: Physician Assistant

## 2022-03-24 ENCOUNTER — Ambulatory Visit: Payer: Commercial Managed Care - PPO | Admitting: Student

## 2022-03-25 ENCOUNTER — Other Ambulatory Visit: Payer: Self-pay | Admitting: Internal Medicine

## 2022-04-05 ENCOUNTER — Ambulatory Visit: Payer: Commercial Managed Care - PPO | Attending: Student | Admitting: Student

## 2022-04-05 ENCOUNTER — Encounter: Payer: Self-pay | Admitting: Student

## 2022-04-05 VITALS — BP 124/78 | HR 80 | Ht 73.0 in | Wt 277.0 lb

## 2022-04-05 DIAGNOSIS — I428 Other cardiomyopathies: Secondary | ICD-10-CM | POA: Diagnosis not present

## 2022-04-05 DIAGNOSIS — I480 Paroxysmal atrial fibrillation: Secondary | ICD-10-CM

## 2022-04-05 DIAGNOSIS — I4892 Unspecified atrial flutter: Secondary | ICD-10-CM

## 2022-04-05 DIAGNOSIS — I10 Essential (primary) hypertension: Secondary | ICD-10-CM

## 2022-04-05 NOTE — Progress Notes (Signed)
  Electrophysiology Office Note:   Date:  04/05/2022  ID:  Ethan Pitts, DOB 1960-04-25, MRN [id number]  Primary Cardiologist: Kirk Ruths, MD Electrophysiologist: [name], MD   History of Present Illness:   Ethan Pitts is a 62 y.o. male with h/o AFL s/p ablation, PAF, OSA, HTN, and DM2 seen today for post hospital follow up.    Admitted 2/14 - 2/16 with PNA. Was initially in AF and spontaneously converted as his tikosyn was continued.   Since discharge from hospital the patient reports doing very well. Looking at his list, he thinks he resumed his normal coreg and benazepril as soon as he got home. He will confirm. he denies chest pain, palpitations, dyspnea, PND, orthopnea, nausea, vomiting, dizziness, syncope, edema, weight gain, or early satiety. 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   Review of systems complete and found to be negative unless listed in HPI.   Studies Reviewed:    EKG is not ordered today. EKG from 02/15/22 reviewed which showed NSR at 86 bpm with stable QTc  Risk Assessment/Calculations:    CHA2DS2-VASc Score = 4    Physical Exam:   VS:  BP 124/78   Pulse 80   Ht 6\' 1"  (1.854 m)   Wt 277 lb (125.6 kg)   SpO2 96%   BMI 36.55 kg/m    Wt Readings from Last 3 Encounters:  04/05/22 277 lb (125.6 kg)  03/02/22 276 lb (125.2 kg)  02/15/22 280 lb (127 kg)     GEN: Well nourished, well developed in no acute distress NECK: No JVD; No carotid bruits CARDIAC: Regular rate and rhythm, no murmurs, rubs, gallops RESPIRATORY:  Clear to auscultation without rales, wheezing or rhonchi  ABDOMEN: Soft, non-tender, non-distended EXTREMITIES:  No edema; No deformity   ASSESSMENT AND PLAN:    PAF AFL Had AF during recent PNA admission EKG 2/14 showed NSR with stable QT Labs today.   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  He is sure he is still taking coreg and  benazepril, he will confirm at home and let us know.   HTN Stable on current regimen    Follow up with Dr. Quentin Ore in 2-3 months to discuss possibility of Watchman. I let him know he is borderline candidate, but he does have a high risk job.   Signed, Shirley Friar, PA-C

## 2022-04-05 NOTE — Patient Instructions (Signed)
Medication Instructions:  Your physician recommends that you continue on your current medications as directed. Please refer to the Current Medication list given to you today.  Call and let us know or send [place] a mychart msg with the current dose that you are on of carvedilol and benazepril *If you need a refill on your cardiac medications before your next appointment, please call your pharmacy*  Lab Work: BMET, MAG--TODAY If you have labs (blood work) drawn today and your tests are completely normal, you will receive your results only by: [place] (if you have MyChart) OR A paper copy in the mail If you have any lab test that is abnormal or we need to change your treatment, we will call you to review the results.   Follow-Up: At [facility], you and your health needs are our priority.  As part of our continuing mission to provide you with exceptional heart care, we have created designated Provider Care Teams.  These Care Teams include your primary Cardiologist (physician) and Advanced Practice Providers (APPs -  Physician Assistants and Nurse Practitioners) who all work together to provide you with the care you need, when you need it.   Your next appointment:   2-3 month(s)  with Dr Quentin Ore to discuss watchman  6 months with Dr Lovena Le

## 2022-04-06 LAB — MAGNESIUM: Magnesium: 2.4 mg/dL — ABNORMAL HIGH (ref 1.6–2.3)

## 2022-04-06 LAB — BASIC METABOLIC PANEL
BUN/Creatinine Ratio: 17 (ref 10–24)
BUN: 17 mg/dL (ref 8–27)
CO2: 23 mmol/L (ref 20–29)
Calcium: 10.1 mg/dL (ref 8.6–10.2)
Chloride: 98 mmol/L (ref 96–106)
Creatinine, Ser: 1.02 mg/dL (ref 0.76–1.27)
Glucose: 160 mg/dL — ABNORMAL HIGH (ref 70–99)
Potassium: 4.9 mmol/L (ref 3.5–5.2)
Sodium: 137 mmol/L (ref 134–144)
eGFR: 84 mL/min/{1.73_m2} (ref >59)

## 2022-05-03 ENCOUNTER — Ambulatory Visit: Payer: Commercial Managed Care - PPO | Admitting: Cardiology

## 2022-05-19 ENCOUNTER — Encounter: Payer: Self-pay | Admitting: Cardiovascular Disease

## 2022-05-19 ENCOUNTER — Ambulatory Visit: Payer: Commercial Managed Care - PPO | Attending: Cardiovascular Disease | Admitting: Cardiovascular Disease

## 2022-05-19 VITALS — BP 122/64 | HR 70 | Ht 73.0 in | Wt 272.8 lb

## 2022-05-19 DIAGNOSIS — E669 Obesity, unspecified: Secondary | ICD-10-CM

## 2022-05-19 DIAGNOSIS — G4719 Other hypersomnia: Secondary | ICD-10-CM | POA: Diagnosis not present

## 2022-05-19 DIAGNOSIS — I428 Other cardiomyopathies: Secondary | ICD-10-CM | POA: Diagnosis not present

## 2022-05-19 DIAGNOSIS — Z7901 Long term (current) use of anticoagulants: Secondary | ICD-10-CM

## 2022-05-19 DIAGNOSIS — G4733 Obstructive sleep apnea (adult) (pediatric): Secondary | ICD-10-CM | POA: Diagnosis not present

## 2022-05-19 DIAGNOSIS — Z794 Long term (current) use of insulin: Secondary | ICD-10-CM

## 2022-05-19 DIAGNOSIS — I10 Essential (primary) hypertension: Secondary | ICD-10-CM

## 2022-05-19 DIAGNOSIS — E785 Hyperlipidemia, unspecified: Secondary | ICD-10-CM

## 2022-05-19 DIAGNOSIS — E119 Type 2 diabetes mellitus without complications: Secondary | ICD-10-CM

## 2022-05-19 NOTE — Progress Notes (Unsigned)
Cardiology Office Note    Date:  05/21/2022   ID:  Ethan Pitts, DOB 1960/01/21, MRN [id number]  PCP:  April Manson, NP  Cardiologist:  Nicki Guadalajara, MD (sleep): Dr. Olga Millers  18 month sleep evaluation  History of Present Illness:  Ethan Pitts is a 62 y.o. male who is followed by Dr. Olga Millers with a history of atrial flutter and cardiomyopathy.  He has a history of obstructive sleep apnea. I last saw him on December 23, 2020,   He presents for an 18 month sleep evaluation  Ethan Pitts initially underwent a sleep study in 2009.  On April 03, 2013 he was referred by Dr. Shelle Iron for a another sleep evaluation which was done at the [facility] sleep disorder center.  This revealed very severe sleep apnea with an AHI of 55.5/h and RDI of 63.2/h.  Oxygen nadir was 83%.  Apparently the patient saw Dr. Shelle Iron in July 2015 and by his report CPAP is working well but at times was using his old machine but had received a new machine.  Ethan Pitts has not seen a sleep physician since 2015.  [employer] [employer] [facility] [employer] was his DME company but he is not use them in many years.  He was unaware that [employer] [facility] [employer] had been bought out by [employer].  Over the years he has been buying his CPAP masks online.  He has a history of morbid obesity and remotely had undergone Roux-en-Y gastric bypass surgery with weight reduction from 335 down to 268 but subsequently weight has again increased up to 289.  Presently, he admits to daytime sleepiness I calculated a new Epworth Sleepiness Scale score which endorsed at 14 as shown below.  Epworth Sleepiness Scale: Situation   Chance of Dozing/Sleeping (0 = never , 1 = slight chance , 2 = moderate chance , 3 = high chance )   sitting and reading 3   watching TV 2   sitting inactive in a public place 2   being a passenger in a motor vehicle for an hour or more 2   lying down in the afternoon 3   sitting and talking to someone 1   sitting  quietly after lunch (no alcohol) 2   while stopped for a few minutes in traffic as the driver 0   Total Score  15     He has a history of paroxysmal atrial fibrillation and atrial flutter as well as type 2 diabetes mellitus, hypertension, and chronic anxiety with insomnia.  I saw him for initial sleep evaluation on May 30, 2019 when he was referred for reestablishment of sleep care.  During his initial evaluation I had lengthy discussion with him and reviewed with him the adverse consequences of sleep apnea with reference to his cardiovascular health and the effects of untreated sleep apnea with reference to hypertension control, nocturnal arrhythmias which may have contributed to his history of atrial fibrillation and flutter as well as potential effects of nocturnal hypoxemia, glucose metabolism and GERD and inflammation.  Medication adjustment was made due to his low blood pressure.  I I saw him for follow-up evaluation on December 16, 2020 which he felt he was sleeping well with CPAP and  "cannot sleep without it."  He typically works from 11 AM to 11 PM at [employer] 3 days/week.  He typically goes to bed at 1 AM but he gets up at 7 AM since he has to take his 29-year-old  to school.  I obtained a download of his CPAP unit from November 15 through December 15, 2020.  He has been compliant standards with usage at 6 hours and 8 minutes.  His pressure range is set at 6 to 20 cm of water and his 95th percentile pressure is 14.3 with maximum average pressure 15.7.  AHI 0.6/h.  During that evaluation, I discussed optimal sleep duration at 7 and 9 hours.  His AHI was excellent with 95th percentile pressure at 14.3.  I changed his CPAP settings from 6-20 to 10 to 20 cm of water.  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  His hours typically are from 3 PM until 3 8 AM.  He will soon be changing to 7 PM to 7 AM.  His child is 77 years old.  I obtained a download from  April 16 through May 17, 2022.  Usage is excellent.  Average use is 6 hours and 8 minutes.  His 95th percentile pressure is 13.5 with maximum average pressure 14.6.  AHI is 1.5/h.  He continues to be on amlodipine 5 mg, carvedilol 25 mg twice a day, spironolactone 25 mg daily.  He is on Tikosyn and maintaining sinus rhythm unaware of recurrent AF.  He is anticoagulated with Xarelto.  He is on insulin, glipizide, Jardiance 25 mg in addition to [facility] for diabetes.  He presents for evaluation.  Past Medical History:  Diagnosis Date   Anxiety    Arthritis    lower back    Atrial flutter (HCC) 2020   Cards->lopressor and anticoag, ablation. In sinus still as of 08/2018 cards f/u.  Recurrence + PAF->requiring pt to get back on xarelto   BPH with obstruction/lower urinary tract symptoms    started flomax via urol 03/2019   CAD (coronary artery disease) 03/2018   Ostial first diagonal 60% narrowed, otherwise normal coronaries. Dr. Jens Som added ASA and statin 08/2018.   Depression    Diabetes mellitus    False positive stress test 03/2018   Cath showed normal coronaries with EF 50%   GERD (gastroesophageal reflux disease)    Hyperlipidemia    Hypertension    Hypogonadism, male 05/2015   Increased prostate specific antigen (PSA) velocity 2019   06/2017 velocity up; recheck 10/2017 back to baseline.  Plan repeat 1 yr.   Low back pain 2016   Left lumbar radiculopathy, lumbar spondylolisthesis.  Sciatica episode 03/2016.  Ortho getting L spine MRI as of 07/2016.   Microcytic anemia 12/2015   suspect iron def due to malabsorption.  Hemoccults NEG 01/26/16, ferrous sulfate started.   Neuropathic pain of left foot    NICM (nonischemic cardiomyopathy) (HCC) 05/2018   EF 40-45%, +LV hypokinesis. Suspected to be tachycardia-mediated->Dr. Jens Som to rpt echo fall 2020.   OSA on CPAP    cpap settings at 3   Peyronie's disease 2021   Dr. Thomasenia Sales (collegenase intralesional injections)    Past  Surgical History:  Procedure Laterality Date   A-FLUTTER ABLATION N/A 07/09/2018   Procedure: A-FLUTTER ABLATION;  Surgeon: Marinus Maw, MD;  Location: [facility] INVASIVE CV LAB;  Service: Cardiovascular;  Laterality: N/A;   ADENOIDECTOMY     BREATH TEK H PYLORI N/A 01/08/2013   Procedure: BREATH TEK H PYLORI;  Surgeon: Valarie Merino, MD;  Location: [facility];  Service: General;  Laterality: N/A;   CARDIAC CATHETERIZATION  03/2018   No signi obstructive CAD (suspected FALSE POSITIVE STRESS TEST).  EF 50%.  Diastolic dysfunction.   CARDIOVASCULAR STRESS TEST  03/2018   Intermediate risk; EF 40%, medium sized defect with peri-infarct ischemia-->follow up cath showed no signif obstructive CAD.   COLONOSCOPY  04/17/2014   Benign polyp x 1.  Recall 10 yrs.   GASTRIC ROUX-EN-Y N/A 04/14/2013   Procedure: LAPAROSCOPIC ROUX-EN-Y GASTRIC BYPASS WITH UPPER ENDOSCOPY;  Surgeon: Valarie Merino, MD;  Location: [facility] ORS;  Service: General;  Laterality: N/A;   LEFT HEART CATH AND CORONARY ANGIOGRAPHY N/A 03/22/2018   NO CAD.  EF 50%. Procedure: LEFT HEART CATH AND CORONARY ANGIOGRAPHY;  Surgeon: Lyn Records, MD;  Location: [facility] INVASIVE CV LAB;  Service: Cardiovascular;  Laterality: N/A;   Rhythm monitoring  01/08/2019   7 day monitor->Sinus rhythm with occasional PAC, PVC, paroxysmal atrial flutter, paroxysmal atrial fibrillation and 7 beats nonsustained ventricular tachycardia: xarelto restarted   TONSILLECTOMY     TRANSTHORACIC ECHOCARDIOGRAM  05/13/2018;12/03/18   05/2018 severe hypokinesis of LV inferior and inferolateral walls, EF 40-45%, correlates with stress test.  12/2018->EF 40-45%, diastolic fxn/LV filling pressure not determined due to pt in a fib/flutter    Current Medications: Outpatient Medications Prior to Visit  Medication Sig Dispense Refill   carvedilol (COREG) 25 MG tablet Take 25 mg by mouth 2 (two) times daily.     spironolactone (ALDACTONE) 25 MG tablet Take 25 mg by mouth daily.      ALPRAZolam (XANAX) 0.5 MG tablet Take 1 tablet (0.5 mg total) by mouth at bedtime as needed for anxiety or sleep. 30 tablet 0   amLODipine (NORVASC) 5 MG tablet TAKE 1 TABLET BY MOUTH ONCE DAILY . APPOINTMENT REQUIRED FOR FUTURE REFILLS 90 tablet 3   atorvastatin (LIPITOR) 40 MG tablet Take 1 tablet by mouth once daily 90 tablet 0   dofetilide (TIKOSYN) 500 MCG capsule Take 1 capsule by mouth twice daily 180 capsule 1   glipiZIDE (GLUCOTROL XL) 10 MG 24 hr tablet Take 1 tablet by mouth once daily with breakfast 30 tablet 0   Insulin Pen Needle 31G X 5 MM MISC Use with Victoza as directed 100 each 11   JARDIANCE 25 MG TABS tablet Take 25 mg by mouth daily.     Magnesium Oxide 400 MG CAPS Take 1 capsule (400 mg total) by mouth 2 (two) times daily. 180 capsule 2   metFORMIN (GLUCOPHAGE) 1000 MG tablet TAKE 1 TABLET BY MOUTH TWICE DAILY WITH A MEAL 180 tablet 3   MOUNJARO 5 MG/0.5ML Pen SMARTSIG:5 Milligram(s) SUB-Q Once a Week     pregabalin (LYRICA) 150 MG capsule Take 1 capsule by mouth twice daily 60 capsule 5   tamsulosin (FLOMAX) 0.4 MG CAPS capsule Take 0.4 mg by mouth daily in the afternoon.      testosterone cypionate (DEPOTESTOSTERONE CYPIONATE) 200 MG/ML injection 1 ml IM q 14 days 10 mL 0   XARELTO 20 MG TABS tablet TAKE 1 TABLET BY MOUTH ONCE DAILY WITH SUPPER 90 tablet 2   zaleplon (SONATA) 5 MG capsule TAKE 1 CAPSULE BY MOUTH AT BEDTIME AS NEEDED FOR SLEEP 90 capsule 1   No facility-administered medications prior to visit.     Allergies:   Patient has no known allergies.   Social History   Socioeconomic History   Marital status: Divorced    Spouse name: Not on file   Number of children: Not on file   Years of education: Not on file   Highest education level: Not on file  Occupational History  Occupation: Paramedic-EMT    Employer: [employer]  Tobacco Use   Smoking status: Never   Smokeless tobacco: Former    Types: Chew    Quit date: 02/06/2010   Tobacco  comments:    Former Chew 07/07/21  Substance and Sexual Activity   Alcohol use: Yes    Alcohol/week: 18.0 standard drinks of alcohol    Types: 18 Cans of beer per week    Comment: 2-3 beers daily 07/07/21   Drug use: No   Sexual activity: Yes  Other Topics Concern   Not on file  Social History Narrative   Married, no children.   Occupation: paramedic   Social Determinants of Health   Financial Resource Strain: Not on file  Food Insecurity: No Food Insecurity (02/15/2022)   Hunger Vital Sign    Worried About Running Out of Food in the Last Year: Never true    Ran Out of Food in the Last Year: Never true  Transportation Needs: No Transportation Needs (02/15/2022)   PRAPARE - Administrator, Civil Service (Medical): No    Lack of Transportation (Non-Medical): No  Physical Activity: Not on file  Stress: Not on file  Social Connections: Not on file    Socially he is divorced.  Adopted a 58-year-old.  He is retired from [employer].  He works at [employer] at a critical care team.  Family History:  The patient's family history includes Arrhythmia in his father; COPD in his father; Heart disease in his father; Prostate cancer in his father.  His father died at age 29 with pneumonia.  Mother is 75 and living.  He has 1 brother age 53.  ROS General: Negative; No fevers, chills, or night sweats;  HEENT: Negative; No changes in vision or hearing, sinus congestion, difficulty swallowing Pulmonary: Negative; No cough, wheezing, shortness of breath, hemoptysis Cardiovascular: Negative; No chest pain, presyncope, syncope, palpitations GI: Negative; No nausea, vomiting, diarrhea, or abdominal pain GU: Negative; No dysuria, hematuria, or difficulty voiding Musculoskeletal: Negative; no myalgias, joint pain, or weakness Hematologic/Oncology: Negative; no easy bruising, bleeding Endocrine: Negative; no heat/cold intolerance; no diabetes Neuro: Negative; no changes in  balance, headaches Skin: Negative; No rashes or skin lesions Psychiatric: Negative; No behavioral problems, depression Sleep: Negative; No snoring, daytime sleepiness, hypersomnolence, bruxism, restless legs, hypnogognic hallucinations, no cataplexy Other comprehensive 14 point system review is negative.   PHYSICAL EXAM:   VS:  BP 122/64 (BP Location: Left Arm, Patient Position: Sitting, Cuff Size: Normal)   Pulse 70   Ht 6\' 1"  (1.854 m)   Wt 272 lb 12.8 oz (123.7 kg)   SpO2 92%   BMI 35.99 kg/m     Repeat blood pressure by me was 120/70  Wt Readings from Last 3 Encounters:  05/19/22 272 lb 12.8 oz (123.7 kg)  04/05/22 277 lb (125.6 kg)  03/02/22 276 lb (125.2 kg)    General: Alert, oriented, no distress.  Skin: normal turgor, no rashes, warm and dry HEENT: Normocephalic, atraumatic.  Right eye erythema  Nose without nasal septal hypertrophy Mouth/Parynx benign; Mallinpatti scale 3 Neck: No JVD, no carotid bruits; normal carotid upstroke Lungs: clear to ausculatation and percussion; no wheezing or rales Chest wall: without tenderness to palpitation Heart: PMI not displaced, RRR, s1 s2 normal, 1/6 systolic murmur, no diastolic murmur, no rubs, gallops, thrills, or heaves Abdomen: soft, nontender; no hepatosplenomehaly, BS+; abdominal aorta nontender and not dilated by palpation. Back: no CVA tenderness Pulses 2+ Musculoskeletal:  full range of motion, normal strength, no joint deformities Extremities: no clubbing cyanosis or edema, Homan's sign negative  Neurologic: grossly nonfocal; Cranial nerves grossly wnl Psychologic: Normal mood and affect    Studies/Labs Reviewed:   May 19, 2022 ECG (independently read by me): NSR at 70. IVCD  December 16, 2020 ECG (independently read by me): Sinus rhythm at 70, PVC  May 30, 2019 ECG (independently read by me): Atrial flutter with 2:1 block; QTc 453  Recent Labs:    Latest Ref Rng & Units 04/05/2022   11:44 AM 02/17/2022     6:21 AM 02/16/2022    6:01 AM  BMP  Glucose 70 - 99 mg/dL 161  096  045   BUN 8 - 27 mg/dL 17  15  17    Creatinine 0.76 - 1.27 mg/dL 4.09  8.11  9.14   BUN/Creat Ratio 10 - 24 17     Sodium 134 - 144 mmol/L 137  138  134   Potassium 3.5 - 5.2 mmol/L 4.9  3.8  3.2   Chloride 96 - 106 mmol/L 98  101  99   CO2 20 - 29 mmol/L 23  27  24    Calcium 8.6 - 10.2 mg/dL 78.2  9.1  8.4         Latest Ref Rng & Units 02/15/2022    9:00 AM 01/31/2020   11:25 AM 01/08/2019   10:05 AM  Hepatic Function  Total Protein 6.5 - 8.1 g/dL 7.6  7.4  7.0   Albumin 3.5 - 5.0 g/dL 3.9  4.2  4.2   AST 15 - 41 U/L 20  29  22    ALT 0 - 44 U/L 14  25  23    Alk Phosphatase 38 - 126 U/L 53  48  57   Total Bilirubin 0.3 - 1.2 mg/dL 0.8  0.5  0.6        Latest Ref Rng & Units 02/17/2022    6:21 AM 02/16/2022    6:01 AM 02/15/2022    9:00 AM  CBC  WBC 4.0 - 10.5 K/uL 4.2  10.0  15.1   Hemoglobin 13.0 - 17.0 g/dL 95.6  21.3  08.6   Hematocrit 39.0 - 52.0 % 37.1  35.3  43.0   Platelets 150 - 400 K/uL 126  113  142    Lab Results  Component Value Date   MCV 95.9 02/17/2022   MCV 92.7 02/16/2022   MCV 90.5 02/15/2022   Lab Results  Component Value Date   TSH 0.581 02/03/2018   Lab Results  Component Value Date   HGBA1C 6.3 (H) 02/15/2022     BNP    Component Value Date/Time   BNP 84.7 04/08/2019 2022    ProBNP No results found for: "PROBNP"   Lipid Panel     Component Value Date/Time   CHOL 90 01/08/2019 1005   TRIG 132.0 01/08/2019 1005   HDL 31.10 (L) 01/08/2019 1005   CHOLHDL 3 01/08/2019 1005   VLDL 26.4 01/08/2019 1005   LDLCALC 32 01/08/2019 1005   LDLCALC 39 02/23/2017 1636   LDLDIRECT 75.0 06/23/2016 0941     RADIOLOGY: No results found.   Additional studies/ records that were reviewed today include:  I have reviewed the [facility] polyp sonographic report from April 03, 2013.  I reviewed the July 02, 2013 record of Dr. Shelle Iron.  Recent records from Dr.  Horatio Pel were reviewed.  Epworth Sleepiness Scale score was  calculated in the office today.  I obtain a new download from November 15 through December 15, 2020.  ASSESSMENT:    1. OSA (obstructive sleep apnea)   2. Excessive daytime sleepiness   3. Essential hypertension   4. NICM (nonischemic cardiomyopathy) (HCC)   5. Type 2 diabetes mellitus without complication, with long-term current use of insulin (HCC)   6. Class II obesity   7. Chronic anticoagulation   8. Hyperlipidemia, unspecified hyperlipidemia type     PLAN:  Ethan Pitts is a 62 year old gentleman who has a history of morbid obesity and had undergone prior Roux-en-Y gastric bypass surgery.  He had significant weight reduction from 335 down to 268 but recently has gained weight back.  Had a subsequent prior evaluation he had gained weight up to 300 pounds with BMI 39.6.  He is diabetic on Jardiance, metformin, glipizide, insulin, and is now on Mounjaro.  He has lost weight down to 272 today with BMI 35.99.  He has continued to use CPAP therapy.  Since his last visit with me in December 2015, [facility] is no longer providing CPAP care.  As result he will need to be set up with CPAP company.  Based on his insurance, I will set him up with [facility].  The [employer] rep was in the office today who will arrange this.  He continues to use CPAP with excellent compliance.  However average use is only 6 hours and 8 minutes which most likely is reflected by his work hours.  An Epworth Sleepiness Scale score calculated at 12 consistent with residual daytime sleepiness.  I suspect this is rate related to his inadequate sleep duration resulting from his work hours.  He states however on days off he does sleep for 8 hours.  Again I discussed optimal sleep duration at 7 and 9 hours.  On his most recent download, AHI is 1.5 with his pressure set at a range of 10 to 20 cm.  95th percentile pressure is 13.5 with maximum average  pressure 14.6.  We discussed the importance of continued weight loss and exercise.  His blood pressure today is well-controlled on amlodipine 5 mg, carvedilol 25 mg twice a day, spironolactone 25 mg daily.  He is on dofetilide and is without recurrent atrial fibrillation.  He is anticoagulated on Xarelto.  He has been using nasal pillow mask which he states he tolerates exceptionally well.  As result I will not change his mask.  He will be contacted by [facility] for supplies.  He will follow-up cardiology care with Dr. Jens Som.  He is scheduled to see Dr. [name] in August for EP evaluation.  As long as he is stable I will see him in 1 year for reevaluation.    .  Medication Adjustments/Labs and Tests Ordered: Current medicines are reviewed at length with the patient today.  Concerns regarding medicines are outlined above.  Medication changes, Labs and Tests ordered today are listed in the Patient Instructions below. Patient Instructions  Medication Instructions:  Your physician recommends that you continue on your current medications as directed. Please refer to the Current Medication list given to you today.  *If you need a refill on your cardiac medications before your next appointment, please call your pharmacy*   Follow-Up: At [facility], you and your health needs are our priority.  As part of our continuing mission to provide you with exceptional heart care, we have created designated Provider Care Teams.  These Care  Teams include your primary Cardiologist (physician) and Advanced Practice Providers (APPs -  Physician Assistants and Nurse Practitioners) who all work together to provide you with the care you need, when you need it.  We recommend signing up for the patient portal called "MyChart".  Sign up information is provided on this After Visit Summary.  MyChart is used to connect with patients for Virtual Visits (Telemedicine).  Patients are able to view lab/test results,  encounter notes, upcoming appointments, etc.  Non-urgent messages can be sent to your provider as well.   To learn more about what you can do with MyChart, go to [email or web address].    Your next appointment:   12 month(s)  Provider:   Nicki Guadalajara, MD  Other Instructions Sleep Clinic    Signed, Nicki Guadalajara, MD, [facility], ABSM Diplomate, American Board of Sleep Medicine  05/21/2022 5:26 PM    [facility] [address], [address], [place], [place]  [address] Phone: [phone]

## 2022-05-19 NOTE — Patient Instructions (Signed)
Medication Instructions:  Your physician recommends that you continue on your current medications as directed. Please refer to the Current Medication list given to you today.  *If you need a refill on your cardiac medications before your next appointment, please call your pharmacy*   Follow-Up: At [facility], you and your health needs are our priority.  As part of our continuing mission to provide you with exceptional heart care, we have created designated Provider Care Teams.  These Care Teams include your primary Cardiologist (physician) and Advanced Practice Providers (APPs -  Physician Assistants and Nurse Practitioners) who all work together to provide you with the care you need, when you need it.  We recommend signing up for the patient portal called "MyChart".  Sign up information is provided on this After Visit Summary.  MyChart is used to connect with patients for Virtual Visits (Telemedicine).  Patients are able to view lab/test results, encounter notes, upcoming appointments, etc.  Non-urgent messages can be sent to your provider as well.   To learn more about what you can do with MyChart, go to [email or web address].    Your next appointment:   12 month(s)  Provider:   Thomas Kelly, MD  Other Instructions Sleep Clinic  

## 2022-05-21 ENCOUNTER — Encounter: Payer: Self-pay | Admitting: Cardiovascular Disease

## 2022-07-05 NOTE — Progress Notes (Deleted)
HPI: Follow-up atrial fibrillation/flutter and cardiomyopathy.  Patient diagnosed with atrial flutter February 2020. LV function felt to be reduced on technically difficult echo 3/20. Cath 3/20 showed EF 50 60 D1 and otherwise normal coronaries. Had atrial flutter ablation 7/20. Follow-up monitor January 2021 showed sinus with occasional PAC, PVC, paroxysmal atrial flutter, paroxysmal atrial fibrillation and 7 beats nonsustained ventricular tachycardia.  Patient ultimately placed on Tikosyn. Echocardiogram February 2024 showed ejection fraction 50 to 55%, severe left ventricular enlargement. Since last seen,   Current Outpatient Medications  Medication Sig Dispense Refill   ALPRAZolam (XANAX) 0.5 MG tablet Take 1 tablet (0.5 mg total) by mouth at bedtime as needed for anxiety or sleep. 30 tablet 0   amLODipine (NORVASC) 5 MG tablet TAKE 1 TABLET BY MOUTH ONCE DAILY . APPOINTMENT REQUIRED FOR FUTURE REFILLS 90 tablet 3   atorvastatin (LIPITOR) 40 MG tablet Take 1 tablet by mouth once daily 90 tablet 0   carvedilol (COREG) 25 MG tablet Take 25 mg by mouth 2 (two) times daily.     dofetilide (TIKOSYN) 500 MCG capsule Take 1 capsule by mouth twice daily 180 capsule 1   glipiZIDE (GLUCOTROL XL) 10 MG 24 hr tablet Take 1 tablet by mouth once daily with breakfast 30 tablet 0   Insulin Pen Needle 31G X 5 MM MISC Use with Victoza as directed 100 each 11   JARDIANCE 25 MG TABS tablet Take 25 mg by mouth daily.     Magnesium Oxide 400 MG CAPS Take 1 capsule (400 mg total) by mouth 2 (two) times daily. 180 capsule 2   metFORMIN (GLUCOPHAGE) 1000 MG tablet TAKE 1 TABLET BY MOUTH TWICE DAILY WITH A MEAL 180 tablet 3   MOUNJARO 5 MG/0.5ML Pen SMARTSIG:5 Milligram(s) SUB-Q Once a Week     pregabalin (LYRICA) 150 MG capsule Take 1 capsule by mouth twice daily 60 capsule 5   spironolactone (ALDACTONE) 25 MG tablet Take 25 mg by mouth daily.     tamsulosin (FLOMAX) 0.4 MG CAPS capsule Take 0.4 mg by mouth  daily in the afternoon.      testosterone cypionate (DEPOTESTOSTERONE CYPIONATE) 200 MG/ML injection 1 ml IM q 14 days 10 mL 0   XARELTO 20 MG TABS tablet TAKE 1 TABLET BY MOUTH ONCE DAILY WITH SUPPER 90 tablet 2   zaleplon (SONATA) 5 MG capsule TAKE 1 CAPSULE BY MOUTH AT BEDTIME AS NEEDED FOR SLEEP 90 capsule 1   No current facility-administered medications for this visit.     Past Medical History:  Diagnosis Date   Anxiety    Arthritis    lower back    Atrial flutter (HCC) 2020   Cards->lopressor and anticoag, ablation. In sinus still as of 08/2018 cards f/u.  Recurrence + PAF->requiring pt to get back on xarelto   BPH with obstruction/lower urinary tract symptoms    started flomax via urol 03/2019   CAD (coronary artery disease) 03/2018   Ostial first diagonal 60% narrowed, otherwise normal coronaries. Dr. Jens Som added ASA and statin 08/2018.   Depression    Diabetes mellitus    False positive stress test 03/2018   Cath showed normal coronaries with EF 50%   GERD (gastroesophageal reflux disease)    Hyperlipidemia    Hypertension    Hypogonadism, male 05/2015   Increased prostate specific antigen (PSA) velocity 2019   06/2017 velocity up; recheck 10/2017 back to baseline.  Plan repeat 1 yr.   Low back pain 2016  Left lumbar radiculopathy, lumbar spondylolisthesis.  Sciatica episode 03/2016.  Ortho getting L spine MRI as of 07/2016.   Microcytic anemia 12/2015   suspect iron def due to malabsorption.  Hemoccults NEG 01/26/16, ferrous sulfate started.   Neuropathic pain of left foot    NICM (nonischemic cardiomyopathy) (HCC) 05/2018   EF 40-45%, +LV hypokinesis. Suspected to be tachycardia-mediated->Dr. Jens Som to rpt echo fall 2020.   OSA on CPAP    cpap settings at 3   Peyronie's disease 2021   Dr. Thomasenia Sales (collegenase intralesional injections)    Past Surgical History:  Procedure Laterality Date   A-FLUTTER ABLATION N/A 07/09/2018   Procedure: A-FLUTTER ABLATION;   Surgeon: Marinus Maw, MD;  Location: [facility] INVASIVE CV LAB;  Service: Cardiovascular;  Laterality: N/A;   ADENOIDECTOMY     BREATH TEK H PYLORI N/A 01/08/2013   Procedure: BREATH TEK H PYLORI;  Surgeon: Valarie Merino, MD;  Location: [facility];  Service: General;  Laterality: N/A;   CARDIAC CATHETERIZATION  03/2018   No signi obstructive CAD (suspected FALSE POSITIVE STRESS TEST).  EF 50%.  Diastolic dysfunction.   CARDIOVASCULAR STRESS TEST  03/2018   Intermediate risk; EF 40%, medium sized defect with peri-infarct ischemia-->follow up cath showed no signif obstructive CAD.   COLONOSCOPY  04/17/2014   Benign polyp x 1.  Recall 10 yrs.   GASTRIC ROUX-EN-Y N/A 04/14/2013   Procedure: LAPAROSCOPIC ROUX-EN-Y GASTRIC BYPASS WITH UPPER ENDOSCOPY;  Surgeon: Valarie Merino, MD;  Location: [facility];  Service: General;  Laterality: N/A;   LEFT HEART CATH AND CORONARY ANGIOGRAPHY N/A 03/22/2018   NO CAD.  EF 50%. Procedure: LEFT HEART CATH AND CORONARY ANGIOGRAPHY;  Surgeon: Lyn Records, MD;  Location: [facility] INVASIVE CV LAB;  Service: Cardiovascular;  Laterality: N/A;   Rhythm monitoring  01/08/2019   7 day monitor->Sinus rhythm with occasional PAC, PVC, paroxysmal atrial flutter, paroxysmal atrial fibrillation and 7 beats nonsustained ventricular tachycardia: xarelto restarted   TONSILLECTOMY     TRANSTHORACIC ECHOCARDIOGRAM  05/13/2018;12/03/18   05/2018 severe hypokinesis of LV inferior and inferolateral walls, EF 40-45%, correlates with stress test.  12/2018->EF 40-45%, diastolic fxn/LV filling pressure not determined due to pt in a fib/flutter    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EMT    Employer: [employer]  Tobacco Use   Smoking status: Never   Smokeless tobacco:  Former    Types: Chew    Quit date: 02/06/2010   Tobacco comments:    Former Chew 07/07/21  Substance and Sexual Activity   Alcohol use: Yes    Alcohol/week: 18.0 standard drinks of alcohol    Types: 18 Cans of beer per week    Comment: 2-3 beers daily 07/07/21   Drug use: No   Sexual activity: Yes  Other Topics Concern   Not on file  Social History Narrative   Married, no children.   Occupation: paramedic   Social Determinants of Health   Financial Resource Strain: Not on file  Food Insecurity: No Food Insecurity (02/15/2022)   Hunger Vital Sign    Worried About Running Out of Food in the Last Year: Never true    Ran Out of Food in the Last Year: Never true  Transportation  Needs: No Transportation Needs (02/15/2022)   PRAPARE - Administrator, Civil Service (Medical): No    Lack of Transportation (Non-Medical): No  Physical Activity: Not on file  Stress: Not on file  Social Connections: Not on file  Intimate Partner Violence: Not At Risk (02/15/2022)   Humiliation, Afraid, Rape, and Kick questionnaire    Fear of Current or Ex-Partner: No    Emotionally Abused: No    Physically Abused: No    Sexually Abused: No    Family History  Problem Relation Age of Onset   Heart disease Father        valvular disease   COPD Father    Prostate cancer Father    Arrhythmia Father    Colon cancer Neg Hx     ROS: no fevers or chills, productive cough, hemoptysis, dysphasia, odynophagia, melena, hematochezia, dysuria, hematuria, rash, seizure activity, orthopnea, PND, pedal edema, claudication. Remaining systems are negative.  Physical Exam: Well-developed well-nourished in no acute distress.  Skin is warm and dry.  HEENT is normal.  Neck is supple.  Chest is clear to auscultation with normal expansion.  Cardiovascular exam is regular rate and rhythm.  Abdominal exam nontender or distended. No masses palpated. Extremities show no edema. neuro grossly intact  ECG-  personally reviewed  A/P  1 paroxysmal atrial fibrillation/flutter-patient is in sinus rhythm today.  Continue carvedilol, Tikosyn and Xarelto.  Check renal function, magnesium and hemoglobin.  2 nonischemic cardiomyopathy-LV function improved on most recent echocardiogram.  Continue ACE inhibitor and beta-blocker.  3 hypertension-blood pressure controlled.  Continue present medical regimen.  4 coronary artery disease-patient denies chest pain.  Continue aspirin and statin.  5 obstructive sleep apnea-managed by Dr. Tresa Endo.  6 morbid obesity-we again discussed the importance of diet, exercise and weight loss.  Olga Millers, MD

## 2022-07-17 ENCOUNTER — Ambulatory Visit: Payer: Commercial Managed Care - PPO | Attending: Cardiology | Admitting: Cardiology

## 2022-08-03 ENCOUNTER — Ambulatory Visit: Payer: Commercial Managed Care - PPO | Attending: Cardiology | Admitting: Cardiology

## 2022-08-03 ENCOUNTER — Encounter: Payer: Self-pay | Admitting: Cardiology

## 2022-08-03 VITALS — BP 122/68 | HR 69 | Ht 72.0 in | Wt 264.0 lb

## 2022-08-03 DIAGNOSIS — Z79899 Other long term (current) drug therapy: Secondary | ICD-10-CM | POA: Diagnosis not present

## 2022-08-03 DIAGNOSIS — I4819 Other persistent atrial fibrillation: Secondary | ICD-10-CM | POA: Diagnosis not present

## 2022-08-03 DIAGNOSIS — I5022 Chronic systolic (congestive) heart failure: Secondary | ICD-10-CM

## 2022-08-03 NOTE — Progress Notes (Signed)
Electrophysiology Office Note:    Date:  08/03/2022   ID:  Ethan Pitts, DOB 1960/07/24, MRN [id number]  [facility] Cardiologist:  Olga Millers, MD  [facility] Electrophysiologist:  Lewayne Bunting, MD   Referring MD: April Manson, NP   Chief Complaint: Atrial fibrillation  History of Present Illness:    Ethan Pitts is a 62 y.o. malewho I am seeing today for an evaluation of atrial fibrillation at the request of Ethan Pitts.  The patient was last seen by Ethan Pitts on April 05, 2022.  The patient has a medical history that includes atrial flutter post ablation, paroxysmal atrial fibrillation, obstructive sleep apnea, hypertension, diabetes.  He is maintained on Tikosyn for rhythm control.  The patient was hospitalized in February of this year with pneumonia.  In the setting of the pneumonia he was in atrial fibrillation but converted while hospitalized back to sinus rhythm.  The patient works on a farm and is concerned about his elevated risk of bleeding.  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  He is interested in left atrial appendage occlusion if he is a candidate.  He is also interested in pursuing rhythm control for his atrial fibrillation.  He is interested in avoiding exposure medications if at all possible.  He is on [facility] and has lost a significant amount of weight (at least 30 pounds).    Their past medical, social and family history was reveiwed.   ROS:   Please see the history of present illness.    All other systems reviewed and are negative.  EKGs/Labs/Other Studies Reviewed:    The following studies were reviewed today:  February 16, 2022 echo EF 50 to 55%. RV normal No MR Trivial TR   EKG Interpretation Date/Time:  Thursday August 03 2022 14:43:16 EDT Ventricular Rate:  69 PR Interval:  186 QRS Duration:  116 QT Interval:  434 QTC Calculation: 465 R Axis:   6  Text  Interpretation: Normal sinus rhythm Confirmed by [name] [phone]) on 08/03/2022 3:14:11 PM    Physical Exam:    VS:  BP 122/68   Pulse 69   Ht 6' (1.829 m)   Wt 264 lb (119.7 kg)   SpO2 94%   BMI 35.80 kg/m     Wt Readings from Last 3 Encounters:  08/03/22 264 lb (119.7 kg)  05/19/22 272 lb 12.8 oz (123.7 kg)  04/05/22 277 lb (125.6 kg)     GEN:  Well nourished, well developed in no acute distress.  Obese CARDIAC: RRR, no murmurs, rubs, gallops RESPIRATORY:  Clear to auscultation without rales, wheezing or rhonchi       ASSESSMENT AND PLAN:    1. Persistent atrial fibrillation (HCC)   2. Encounter for long-term (current) use of high-risk medication   3. Morbid obesity (HCC)   4. Chronic systolic heart failure (HCC)     #Persistent atrial fibrillation #High risk med monitoring-Tikosyn The patient has a history of atrial flutter post ablation.  He then developed atrial fibrillation that is now being managed with Tikosyn. QTc today acceptable for ongoing Tikosyn use. Rhythm control is indicated given his history of chronic systolic heart failure.  He is continue to have breakthrough episodes of atrial fibrillation despite treatment Tikosyn. 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   ---------------------------  Discussed treatment options today for  AF including antiarrhythmic drug therapy and ablation. Discussed risks, recovery and likelihood of success with each treatment strategy. Risk, benefits, and alternatives to EP study and ablation for afib were discussed. These risks include but are not limited to stroke, bleeding, vascular damage, tamponade, perforation, damage to the esophagus, lungs, phrenic nerve and other structures, pulmonary vein stenosis, worsening renal function, coronary vasospasm and death.  Discussed potential need for repeat ablation procedures and antiarrhythmic  drugs after an initial ablation. The patient understands these risk and wishes to proceed.  We will therefore proceed with catheter ablation at the next available time.  Carto, ICE, anesthesia are requested for the procedure.  Will also obtain CT PV protocol prior to the procedure to exclude LAA thrombus and further evaluate atrial anatomy.  -----------------------------  I have seen Ethan Pitts in the office today who is being considered for a Watchman left atrial appendage closure device. I believe they will benefit from this procedure given their history of atrial fibrillation, CHA2DS2-VASc score of 3 and unadjusted ischemic stroke rate of 3.2% per year. 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 The patient's chart has been reviewed and I feel that they would be a candidate for short term oral anticoagulation after Watchman implant.   It is my belief that after undergoing a LAA closure procedure, Ethan Pitts will not need long term anticoagulation which eliminates anticoagulation side effects and major bleeding risk.   Procedural risks for the Watchman implant have been reviewed with the patient including a 0.5% risk of stroke, <1% risk of perforation and <1% risk of device embolization. Other risks include bleeding, vascular damage, tamponade, worsening renal function, and death. The patient understands these risk and wishes to proceed.     The published clinical data on the safety and effectiveness of WATCHMAN include but are not limited to the following: - Holmes DR, [name], [name] et al. for the PROTECT AF Investigators. Percutaneous closure of the left atrial appendage versus warfarin therapy for prevention of stroke in patients with atrial fibrillation: a randomised non-inferiority trial. Lancet 2009; 374: 534-42. [name], Doshi SK, Isa Rankin D et al. on behalf of the PROTECT AF Investigators. Percutaneous Left Atrial Appendage Closure for Stroke Prophylaxis in Patients With Atrial Fibrillation 2.3-Year Follow-up of the PROTECT AF (Watchman Left Atrial Appendage System for Embolic Protection in Patients With Atrial Fibrillation) Trial. Circulation 2013; 127:720-729. - Alli O, Doshi S,  Kar S, Reddy VY, Sievert H et al. Quality of Life Assessment in the Randomized PROTECT AF (Percutaneous Closure of the Left Atrial Appendage Versus Warfarin Therapy for Prevention of Stroke in Patients With Atrial Fibrillation) Trial of Patients at Risk for Stroke With Nonvalvular Atrial Fibrillation. J Am Coll Cardiol 2013; 61:1790-8. Aline August DR, Mia Creek, Price M, Whisenant B, Sievert H, Doshi S, Huber K, Reddy V. Prospective randomized evaluation of the Watchman left atrial appendage Device in patients with atrial fibrillation versus long-term warfarin therapy; the PREVAIL trial. Journal of the [employer] of Cardiology, Vol. 4, No. 1, 2014, 1-11. - Kar S, Doshi SK, Sadhu A, Horton R, Osorio J et al. Primary outcome evaluation of a next-generation left atrial appendage closure device: results from the PINNACLE FLX trial. Circulation 2021;143(18)1754-1762.    After today's visit with the patient which was dedicated solely for shared decision making visit regarding LAA closure device,  the patient decided to proceed with the LAA appendage closure procedure scheduled to be done in the near future at [facility]. Prior to the procedure, I would like to obtain a gated CT scan of the chest with contrast timed for PV/LA visualization.    HAS-BLED score 1 Hypertension Yes  Abnormal renal and liver function (Dialysis, transplant, Cr >2.26 mg/dL /Cirrhosis or Bilirubin >2x Normal or AST/ALT/AP >3x Normal) No  Stroke No  Bleeding No  Labile INR (Unstable/high INR) No  Elderly (>65) No  Drugs or alcohol (? 8 drinks/week, anti-plt or NSAID) No    CHA2DS2-VASc Score = 3  The patient's score is based upon: CHF History: 1 HTN History: 1 Diabetes History: 1 Stroke History: 0 Vascular Disease History: 0 Age Score: 0 Gender Score: 0  His QTc stable for ongoing Tikosyn use.  He needs an updated BMP and magnesium for his Tikosyn.    Signed, Rossie Muskrat. Lalla Brothers, MD, [facility], [facility] 08/03/2022 3:35 PM    Electrophysiology [facility]

## 2022-08-03 NOTE — Patient Instructions (Addendum)
Medication Instructions:  Your physician recommends that you continue on your current medications as directed. Please refer to the Current Medication list given to you today.  *If you need a refill on your cardiac medications before your next appointment, please call your pharmacy*  Labs: TODAY: BMET and Mag  Testing/Procedures: Your physician has requested that you have cardiac CT. Cardiac computed tomography (CT) is a painless test that uses an x-ray machine to take clear, detailed pictures of your heart. For further information please visit [email or web address] We will call you to schedule your CT scan. It will be done about one week prior to your ablation.  Your physician has recommended that you have an ablation. Catheter ablation is a medical procedure used to treat some cardiac arrhythmias (irregular heartbeats). During catheter ablation, a long, thin, flexible tube is put into a blood vessel in your groin (upper thigh), or neck. This tube is called an ablation catheter. It is then guided to your heart through the blood vessel. Radio frequency waves destroy small areas of heart tissue where abnormal heartbeats may cause an arrhythmia to start. You will be scheduled for an ablation to be done in January 2024. We will call you to schedule.   Your physician has requested that you have Left atrial appendage (LAA) closure device implantation is a procedure to put a small device in the LAA of the heart. The LAA is a small sac in the wall of the heart's left upper chamber. Blood clots can form in this area. The device, Watchman closes the LAA to help prevent a blood clot and stroke. We will arrange your watchman procedure after your ablation.   Follow-Up: At [facility], you and your health needs are our priority.  As part of our continuing mission to provide you with exceptional heart care, we have created designated Provider Care Teams.  These Care Teams include your primary Cardiologist  (physician) and Advanced Practice Providers (APPs -  Physician Assistants and Nurse Practitioners) who all work together to provide you with the care you need, when you need it.  Your next appointment:   We will call you to arrange your follow up appointments.

## 2022-08-11 ENCOUNTER — Other Ambulatory Visit: Payer: Self-pay | Admitting: Cardiology

## 2022-09-07 ENCOUNTER — Other Ambulatory Visit (HOSPITAL_COMMUNITY): Payer: Self-pay | Admitting: Physician Assistant

## 2022-11-03 ENCOUNTER — Encounter: Payer: Self-pay | Admitting: Cardiology

## 2022-11-08 ENCOUNTER — Other Ambulatory Visit: Payer: Self-pay

## 2022-11-08 ENCOUNTER — Telehealth: Payer: Self-pay

## 2022-11-08 DIAGNOSIS — Z79899 Other long term (current) drug therapy: Secondary | ICD-10-CM

## 2022-11-08 DIAGNOSIS — I4819 Other persistent atrial fibrillation: Secondary | ICD-10-CM

## 2022-11-08 DIAGNOSIS — I5022 Chronic systolic (congestive) heart failure: Secondary | ICD-10-CM

## 2022-11-15 ENCOUNTER — Telehealth: Payer: Self-pay

## 2022-11-15 NOTE — Telephone Encounter (Addendum)
The patient reported he is interested in concomitant ablation/Watchman on 12/08/2022 if logistics can be confirmed prior to procedure date. He understood ablation is confirmed but Watchman implant is tentative. Will inform the patient as updates are received from the Cath Lab and the precert department.

## 2022-11-22 ENCOUNTER — Ambulatory Visit (HOSPITAL_COMMUNITY): Admission: RE | Admit: 2022-11-22 | Payer: Commercial Managed Care - PPO | Source: Ambulatory Visit

## 2022-11-22 ENCOUNTER — Telehealth (HOSPITAL_COMMUNITY): Payer: Self-pay | Admitting: Emergency Medicine

## 2022-11-22 NOTE — Telephone Encounter (Signed)
Reaching out to patient to offer assistance regarding upcoming cardiac imaging study; pt verbalizes understanding of appt date/time, parking situation and where to check in, pre-test NPO status and medications ordered, and verified current allergies; name and call back number provided for further questions should they arise Ethan Yom RN Navigator Cardiac Imaging [facility] [phone] office [phone] cell 

## 2022-11-24 ENCOUNTER — Ambulatory Visit (HOSPITAL_COMMUNITY)
Admission: RE | Admit: 2022-11-24 | Discharge: 2022-11-24 | Disposition: A | Discharge status: Home / Self Care | Payer: No Typology Code available for payment source | Source: Ambulatory Visit | Attending: Cardiology | Admitting: Cardiology

## 2022-11-24 DIAGNOSIS — I4819 Other persistent atrial fibrillation: Secondary | ICD-10-CM | POA: Diagnosis present

## 2022-11-24 DIAGNOSIS — Z79899 Other long term (current) drug therapy: Secondary | ICD-10-CM | POA: Diagnosis present

## 2022-11-24 DIAGNOSIS — I5022 Chronic systolic (congestive) heart failure: Secondary | ICD-10-CM | POA: Diagnosis present

## 2022-11-24 MED ORDER — IOHEXOL 350 MG/ML SOLN
100.0000 mL | Freq: Once | INTRAVENOUS | Status: AC | PRN
  Administered 2022-11-24: 100 mL via INTRAVENOUS

## 2022-11-26 NOTE — Pre-Procedure Instructions (Signed)
Patient is scheduled for procedure with anesthesia on 12/6.  He takes a medication, Greggory Keen that needs to be help 7 days before procedure.  Left voicemail that his last dose of Mounjaro should be 11/28 or before until after procedure.

## 2022-11-27 ENCOUNTER — Telehealth: Payer: Self-pay

## 2022-11-27 ENCOUNTER — Other Ambulatory Visit (HOSPITAL_COMMUNITY): Payer: Commercial Managed Care - PPO

## 2022-11-27 NOTE — Telephone Encounter (Signed)
Pt called Cath lab asking about his post Ablation restrictions. I called him and went over restrictions with him. 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

## 2022-11-28 ENCOUNTER — Telehealth: Payer: Self-pay

## 2022-11-28 NOTE — Telephone Encounter (Signed)
See CT results:  Informed the patient Dr. Lalla Brothers will review CT and give recommendations for potential PVI/LAAO 12/6.  The patient has an elevated CAC (758, 95th percentile). 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 He had a clean cath in 2020 after a false positive nuclear stress test.  The patient understands he will be called after Dr. Lalla Brothers reviews CT results.

## 2022-11-28 NOTE — Telephone Encounter (Signed)
Per [employer], "Ethan Pitts: Chicken wing anatomy with oblong ostium and long neck. Max 25/ AVG 21/ Depth 16 Likely use a 27mm device and double curve sheath. Inf/Mid-Ant TSP RAO 18 CAU 15"

## 2022-12-04 ENCOUNTER — Other Ambulatory Visit: Payer: Self-pay

## 2022-12-04 DIAGNOSIS — I4819 Other persistent atrial fibrillation: Secondary | ICD-10-CM

## 2022-12-04 NOTE — Telephone Encounter (Signed)
           Henrietta Dine, RN 12/04/2022  5:20 PM EST Back to Top    Lanier Prude, MD  Henrietta Dine, RN; Filbert Schilder, NP OK to proceed with PVI/Watchman. I do not think additional coronary evaluation is indicated given his history. Thanks Dellia Nims     Called to confirm concomitant PVI/LAAO plan. Left message to call back. The patient will need to draw labs prior to procedures.

## 2022-12-05 ENCOUNTER — Ambulatory Visit: Payer: Commercial Managed Care - PPO | Attending: Cardiology

## 2022-12-05 ENCOUNTER — Telehealth: Payer: Self-pay

## 2022-12-05 DIAGNOSIS — E119 Type 2 diabetes mellitus without complications: Secondary | ICD-10-CM

## 2022-12-05 DIAGNOSIS — I10 Essential (primary) hypertension: Secondary | ICD-10-CM | POA: Diagnosis not present

## 2022-12-05 DIAGNOSIS — Z794 Long term (current) use of insulin: Secondary | ICD-10-CM

## 2022-12-05 DIAGNOSIS — I4819 Other persistent atrial fibrillation: Secondary | ICD-10-CM | POA: Diagnosis not present

## 2022-12-05 DIAGNOSIS — Z01818 Encounter for other preprocedural examination: Secondary | ICD-10-CM

## 2022-12-05 DIAGNOSIS — I428 Other cardiomyopathies: Secondary | ICD-10-CM

## 2022-12-05 DIAGNOSIS — R931 Abnormal findings on diagnostic imaging of heart and coronary circulation: Secondary | ICD-10-CM

## 2022-12-05 DIAGNOSIS — I2510 Atherosclerotic heart disease of native coronary artery without angina pectoris: Secondary | ICD-10-CM | POA: Diagnosis not present

## 2022-12-05 NOTE — Telephone Encounter (Signed)
Spoke with the patient. He had labs drawn at [facility] and are available in Care Everywhere. He understands his ablation and Watchman procedure are authorized. He will have a telephone visit this afternoon with Georgie Chard to discuss Watchman in more detail prior to procedures.   Confirmed procedure date of 12/08/2022. Confirmed NEW arrival time of 8:00AM for procedure time at 10:30AM. Reviewed pre-procedure instructions with patient. Confirmed he has already stopped his Mounjaro. The patient is scheduled with Georgie Chard later today for virtual visit to discuss prior to procedure.

## 2022-12-05 NOTE — Telephone Encounter (Signed)
Spoke with the patient. He had labs drawn at [facility] and are available in Care Everywhere. He understands his ablation and Watchman procedure a re authorized. He will have a telephone visit this afternoon with Georgie Chard to discuss Watchman in more detail prior to procedures. He was grateful for call and agreed with plan.     Patient Consent for Virtual Visit        Ethan Pitts has provided verbal consent on 12/05/2022 for a virtual visit (video or telephone).   CONSENT FOR VIRTUAL VISIT FOR:  Ethan Pitts  By participating in this virtual visit I agree to the following:  I hereby voluntarily request, consent and authorize [facility] and its employed or contracted physicians, physician assistants, nurse practitioners or other licensed health care professionals (the Practitioner), to provide me with telemedicine health care services (the "Services") as deemed necessary by the treating Practitioner. I acknowledge and consent to receive the Services by the Practitioner via telemedicine. I understand that the telemedicine visit will involve communicating with the Practitioner through live audiovisual communication technology and the disclosure of certain medical information by electronic transmission. I acknowledge that I have been given the opportunity to request an in-person assessment or other available alternative prior to the telemedicine visit and am voluntarily participating in the telemedicine visit.  I understand that I have the right to withhold or withdraw my consent to the use of telemedicine in the course of my care at any time, without affecting my right to future care or treatment, and that the Practitioner or I may terminate the telemedicine visit at any time. I understand that I have the right to inspect all information obtained and/or recorded in the course of the telemedicine visit and may receive copies of available information for a reasonable fee.  I  understand that some of the potential risks of receiving the Services via telemedicine include:  Delay or interruption in medical evaluation due to technological equipment failure or disruption; Information transmitted may not be sufficient (e.g. poor resolution of images) to allow for appropriate medical decision making by the Practitioner; and/or  In rare instances, security protocols could fail, causing a breach of personal health information.  Furthermore, I acknowledge that it is my responsibility to provide information about my medical history, conditions and care that is complete and accurate to the best of my ability. I acknowledge that Practitioner's advice, recommendations, and/or decision may be based on factors not within their control, such as incomplete or inaccurate data provided by me or distortions of diagnostic images or specimens that may result from electronic transmissions. I understand that the practice of medicine is not an exact science and that Practitioner makes no warranties or guarantees regarding treatment outcomes. I acknowledge that a copy of this consent can be made available to me via my patient portal [facility] MyChart), or I can request a printed copy by calling the office of  [facility].    I understand that my insurance will be billed for this visit.   I have read or had this consent read to me. I understand the contents of this consent, which adequately explains the benefits and risks of the Services being provided via telemedicine.  I have been provided ample opportunity to ask questions regarding this consent and the Services and have had my questions answered to my satisfaction. I give my informed consent for the services to be provided through the use of telemedicine in my medical care

## 2022-12-05 NOTE — Progress Notes (Signed)
Virtual Visit via Telephone Note   Because of Ethan Pitts's co-morbid illnesses, he is at least at moderate risk for complications without adequate follow up.  This format is felt to be most appropriate for this patient at this time.  The patient did not have access to video technology/had techni cal difficulties with video requiring transitioning to audio format only (telephone).  All issues noted in this document were discussed and addressed.  No physical exam could be performed with this format.  Please refer to the patient's chart for his consent to telehealth for [facility].   Date:  12/05/2022   ID:  Ethan Pitts, DOB 1960/03/21, MRN [id number] The patient was identified using 2 identifiers.  Patient Location: Home Provider Location: Office/Clinic  PCP:  April Manson, NP   [facility] Providers Cardiologist:  Olga Millers, MD Electrophysiologist:  Lewayne Bunting, MD     Evaluation Performed:  Follow-Up Visit  Chief Complaint:  Pre-op evaluation (concomitant Watchman/ablation)  History of Present Illness:    AH DOOD is a 62 y.o. male with a history of HTN, NICM, DMII, osteoarthritis, OSA, atrial flutter s/p ablation, and PAF on Tikosyn who was referred back to Dr. Lalla Brothers for AF ablation and concomitant LAAO with Watchman.   Mr. Momon is followed by Dr. Jens Som and the AF clinic for his cardiology/EP care. He was referred for AF ablation and LAAO consideration after having increased frequency of breakthrough episodes of AF after being hospitalized with PNA. He self converted however felt to be in the setting of continued therapy with Tikosyn. After discharge he was seen in follow up and wished to avoid high risk antiarrythmic medications and to pursue rhythm control with ablation. 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 At the time of referral, he was felt to be a good candidate to move forward. CT imaging showed anatomy suitable for both ablation and implant with a large chicken wing LAA suitable for 27mm Watchman FLX Pro device.   He was contacted today for pre-LAAO evaluation. He reports he has been well with no symptoms of chest pain, SOB, palpitations, LE edema, orthopnea, PND, dizziness, or syncope. Denies bleeding in stool or urine.   Past Medical History:  Diagnosis Date   Anxiety    Arthritis    lower back    Atrial flutter (HCC) 2020   Cards->lopressor and anticoag, ablation. In sinus still as of 08/2018 cards f/u.  Recurrence + PAF->requiring pt to get back on xarelto   BPH with obstruction/lower urinary tract symptoms    started flomax via urol 03/2019   CAD (coronary artery disease) 03/2018   Ostial first diagonal 60% narrowed, otherwise normal coronaries. Dr. Jens Som added ASA and statin 08/2018.   Depression    Diabetes mellitus    False positive stress test 03/2018   Cath showed normal coronaries with EF 50%   GERD (gastroesophageal reflux disease)    Hyperlipidemia    Hypertension    Hypogonadism, male 05/2015   Increased prostate specific antigen (PSA) velocity 2019   06/2017 velocity up; recheck 10/2017 back to baseline.  Plan repeat 1 yr.   Low back pain 2016   Left lumbar radiculopathy, lumbar spondylolisthesis.  Sciatica episode 03/2016.  Ortho getting L spine MRI as of 07/2016.   Microcytic anemia 12/2015   suspect iron  def due to malabsorption.  Hemoccults NEG 01/26/16, ferrous sulfate started.   Neuropathic pain of left foot    NICM (nonischemic cardiomyopathy) (HCC) 05/2018   EF 40-45%, +LV hypokinesis. Suspected to be tachycardia-mediated->Dr. Jens Som to rpt echo fall 2020.   OSA on CPAP    cpap settings at 3   Peyronie's disease 2021   Dr. Thomasenia Sales (collegenase intralesional injections)   Past Surgical History:  Procedure Laterality Date    A-FLUTTER ABLATION N/A 07/09/2018   Procedure: A-FLUTTER ABLATION;  Surgeon: Marinus Maw, MD;  Location: [facility] INVASIVE CV LAB;  Service: Cardiovascular;  Laterality: N/A;   ADENOIDECTOMY     BREATH TEK H PYLORI N/A 01/08/2013   Procedure: BREATH TEK H PYLORI;  Surgeon: Valarie Merino, MD;  Location: [facility];  Service: General;  Laterality: N/A;   CARDIAC CATHETERIZATION  03/2018   No signi obstructive CAD (suspected FALSE POSITIVE STRESS TEST).  EF 50%.  Diastolic dysfunction.   CARDIOVASCULAR STRESS TEST  03/2018   Intermediate risk; EF 40%, medium sized defect with peri-infarct ischemia-->follow up cath showed no signif obstructive CAD.   COLONOSCOPY  04/17/2014   Benign polyp x 1.  Recall 10 yrs.   GASTRIC ROUX-EN-Y N/A 04/14/2013   Procedure: LAPAROSCOPIC ROUX-EN-Y GASTRIC BYPASS WITH UPPER ENDOSCOPY;  Surgeon: Valarie Merino, MD;  Location: [facility] ORS;  Service: General;  Laterality: N/A;   LEFT HEART CATH AND CORONARY ANGIOGRAPHY N/A 03/22/2018   NO CAD.  EF 50%. Procedure: LEFT HEART CATH AND CORONARY ANGIOGRAPHY;  Surgeon: Lyn Records, MD;  Location: [facility] INVASIVE CV LAB;  Service: Cardiovascular;  Laterality: N/A;   Rhythm monitoring  01/08/2019   7 day monitor->Sinus rhythm with occasional PAC, PVC, paroxysmal atrial flutter, paroxysmal atrial fibrillation and 7 beats nonsustained ventricular tachycardia: xarelto restarted   TONSILLECTOMY     TRANSTHORACIC ECHOCARDIOGRAM  05/13/2018;12/03/18   05/2018 severe hypokinesis of LV inferior and inferolateral walls, EF 40-45%, correlates with stress test.  12/2018->EF 40-45%, diastolic fxn/LV filling pressure not determined due to pt in a fib/flutter     No outpatient medications have been marked as taking for the 12/05/22 encounter (Appointment) with [facility].     Allergies:   Patient has no known allergies.   Social History   Tobacco Use   Smoking status: Never   Smokeless tobacco: Former    Types: Chew     Quit date: 02/06/2010   Tobacco comments:    Former Chew 07/07/21  Substance Use Topics   Alcohol use: Yes    Alcohol/week: 18.0 standard drinks of alcohol    Types: 18 Cans of beer per week    Comment: 2-3 beers daily 07/07/21   Drug use: No     Family Hx: The patient's family history includes Arrhythmia in his father; COPD in his father; Heart disease in his father; Prostate cancer in his father. There is no history of Colon cancer.  ROS:   Please see the history of present illness.     All other systems reviewed and are negative.  Labs/Other Tests and Data Reviewed:    EKG:  No ECG reviewed.  Recent Labs: 02/15/2022: ALT 14 02/17/2022: Hemoglobin 12.3; Platelets 126 08/03/2022: BUN 8; Creatinine, Ser 0.96; Magnesium 1.9; Potassium 4.2; Sodium 141   Recent Lipid Panel Lab Results  Component Value Date/Time   CHOL 90 01/08/2019 10:05 AM   TRIG 132.0 01/08/2019 10:05 AM   HDL 31.10 (L) 01/08/2019 10:05 AM   CHOLHDL 3 01/08/2019  10:05 AM   LDLCALC 32 01/08/2019 10:05 AM   LDLCALC 39 02/23/2017 04:36 PM   LDLDIRECT 75.0 06/23/2016 09:41 AM    Wt Readings from Last 3 Encounters:  08/03/22 264 lb (119.7 kg)  05/19/22 272 lb 12.8 oz (123.7 kg)  04/05/22 277 lb (125.6 kg)    Risk Assessment/Calculations:    HAS-BLED score 1 Hypertension Yes  Abnormal renal and liver function (Dialysis, transplant, Cr >2.26 mg/dL /Cirrhosis or Bilirubin >2x Normal or AST/ALT/AP >3x Normal) No  Stroke No  Bleeding No  Labile INR (Unstable/high INR) No  Elderly (>65) No  Drugs or alcohol (>= 8 drinks/week, anti-plt or NSAID) No    CHA2DS2-VASc Score = 3  The patient's score is based upon: CHF History: 1 HTN History: 1 Diabetes History: 1 Stroke History: 0 Vascular Disease History: 0 Age Score: 0 Gender Score: 0     Objective:    Vital Signs:  There were no vitals taken for this visit.   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 Pre procedure CT with anatomy suitable to proceed. Instruction letter reviewed with understanding although plan to re-send through MyChart messenger. PAAT labs performed with no changes needed. Plan overnight stay after ablation/LAAO.   Atrial flutter: s/p Aflutter ablation. Denies recent breakthrough symptoms. Continue current regimen with post op plan to be reviewed prior to discharge later this week.   NICM: Most recent echocardiogram 02/16/2022 with low normal EF at 50-55% and no valvular disease. No reports of SOB or LE edema with NYHA class I symptoms.  DMII: Noted to have lost >30lb on Mounjaro. Holding prior to ablation/LAAO.   Elevated calcium score: Calcium score 758 on pre LAAO CT. Normal coronaries on LHC 2020 after false positive nuclear stress test. Plan indefinite ASA after 6 months of post Watchman therapy. Once radiology over-read complete on scan, if an incidentals noted will plan to follow appropriately.      Time:   Today, I have spent 15 minutes with the patient with telehealth technology discussing the above problems.     Medication Adjustments/Labs and Tests Ordered: Current medicines are reviewed at length with the patient today.  Concerns regarding medicines are outlined above.   Tests Ordered: No orders of the defined types were placed in this encounter.   Medication Changes: No orders of the defined types were placed in this encounter.   Follow Up:  In Person Will make post procedure appointments prior to discharge later this week.   Signed, Georgie Chard, NP  12/05/2022 10:32 AM    [facility]

## 2022-12-07 ENCOUNTER — Other Ambulatory Visit (HOSPITAL_COMMUNITY): Payer: Self-pay | Admitting: Physician Assistant

## 2022-12-07 NOTE — Pre-Procedure Instructions (Signed)
Attempted to call patient regarding procedure instructions for tomorrow.  Left voicemail on the following items: Arrival time 0730 Nothing to eat or drink after midnight No meds AM of procedure Responsible person to drive you home and stay with you for 24 hrs  Have you missed any doses of anti-coagulant Xarelto- should be taken once a day, if you have missed any doses please let us know.

## 2022-12-08 ENCOUNTER — Inpatient Hospital Stay (HOSPITAL_COMMUNITY)
Admission: RE | Admit: 2022-12-08 | Discharge: 2022-12-09 | DRG: 317 | Disposition: A | Discharge status: Home / Self Care | Payer: No Typology Code available for payment source | Attending: Cardiology | Admitting: Cardiology

## 2022-12-08 ENCOUNTER — Inpatient Hospital Stay (HOSPITAL_COMMUNITY): Payer: No Typology Code available for payment source | Admitting: Anesthesiology

## 2022-12-08 ENCOUNTER — Encounter: Payer: Self-pay | Admitting: Cardiology

## 2022-12-08 ENCOUNTER — Inpatient Hospital Stay (HOSPITAL_COMMUNITY): Payer: No Typology Code available for payment source

## 2022-12-08 ENCOUNTER — Other Ambulatory Visit: Payer: Self-pay

## 2022-12-08 ENCOUNTER — Inpatient Hospital Stay (HOSPITAL_COMMUNITY)
Admission: RE | Disposition: A | Discharge status: Home / Self Care | Payer: Self-pay | Source: Home / Self Care | Attending: Cardiology

## 2022-12-08 ENCOUNTER — Encounter (HOSPITAL_COMMUNITY): Payer: Self-pay | Admitting: Cardiology

## 2022-12-08 DIAGNOSIS — Z7902 Long term (current) use of antithrombotics/antiplatelets: Secondary | ICD-10-CM | POA: Diagnosis not present

## 2022-12-08 DIAGNOSIS — I4892 Unspecified atrial flutter: Secondary | ICD-10-CM | POA: Diagnosis present

## 2022-12-08 DIAGNOSIS — I5022 Chronic systolic (congestive) heart failure: Secondary | ICD-10-CM | POA: Diagnosis present

## 2022-12-08 DIAGNOSIS — Z95818 Presence of other cardiac implants and grafts: Secondary | ICD-10-CM | POA: Diagnosis not present

## 2022-12-08 DIAGNOSIS — Z9889 Other specified postprocedural states: Secondary | ICD-10-CM

## 2022-12-08 DIAGNOSIS — I4891 Unspecified atrial fibrillation: Secondary | ICD-10-CM

## 2022-12-08 DIAGNOSIS — E785 Hyperlipidemia, unspecified: Secondary | ICD-10-CM | POA: Diagnosis present

## 2022-12-08 DIAGNOSIS — N400 Enlarged prostate without lower urinary tract symptoms: Secondary | ICD-10-CM | POA: Diagnosis present

## 2022-12-08 DIAGNOSIS — I428 Other cardiomyopathies: Secondary | ICD-10-CM | POA: Diagnosis present

## 2022-12-08 DIAGNOSIS — Z6835 Body mass index (BMI) 35.0-35.9, adult: Secondary | ICD-10-CM

## 2022-12-08 DIAGNOSIS — I2510 Atherosclerotic heart disease of native coronary artery without angina pectoris: Secondary | ICD-10-CM | POA: Diagnosis present

## 2022-12-08 DIAGNOSIS — Z006 Encounter for examination for normal comparison and control in clinical research program: Secondary | ICD-10-CM

## 2022-12-08 DIAGNOSIS — Z7901 Long term (current) use of anticoagulants: Secondary | ICD-10-CM | POA: Diagnosis not present

## 2022-12-08 DIAGNOSIS — I4819 Other persistent atrial fibrillation: Secondary | ICD-10-CM | POA: Diagnosis present

## 2022-12-08 DIAGNOSIS — I110 Hypertensive heart disease with heart failure: Secondary | ICD-10-CM | POA: Diagnosis present

## 2022-12-08 DIAGNOSIS — G4733 Obstructive sleep apnea (adult) (pediatric): Secondary | ICD-10-CM | POA: Diagnosis present

## 2022-12-08 DIAGNOSIS — Z6837 Body mass index (BMI) 37.0-37.9, adult: Secondary | ICD-10-CM

## 2022-12-08 DIAGNOSIS — I10 Essential (primary) hypertension: Secondary | ICD-10-CM | POA: Diagnosis present

## 2022-12-08 DIAGNOSIS — E119 Type 2 diabetes mellitus without complications: Secondary | ICD-10-CM | POA: Diagnosis present

## 2022-12-08 DIAGNOSIS — I480 Paroxysmal atrial fibrillation: Secondary | ICD-10-CM | POA: Diagnosis present

## 2022-12-08 DIAGNOSIS — Z8679 Personal history of other diseases of the circulatory system: Secondary | ICD-10-CM

## 2022-12-08 DIAGNOSIS — G4730 Sleep apnea, unspecified: Secondary | ICD-10-CM | POA: Diagnosis present

## 2022-12-08 HISTORY — PX: TRANSESOPHAGEAL ECHOCARDIOGRAM (CATH LAB): EP1270

## 2022-12-08 HISTORY — DX: Presence of other cardiac implants and grafts: Z95.818

## 2022-12-08 HISTORY — PX: ATRIAL FIBRILLATION ABLATION: EP1191

## 2022-12-08 HISTORY — DX: Other specified postprocedural states: Z98.890

## 2022-12-08 HISTORY — PX: LEFT ATRIAL APPENDAGE OCCLUSION: EP1229

## 2022-12-08 LAB — BASIC METABOLIC PANEL
Anion gap: 12 (ref 5–15)
BUN: 11 mg/dL (ref 8–23)
CO2: 24 mmol/L (ref 22–32)
Calcium: 9.4 mg/dL (ref 8.9–10.3)
Chloride: 98 mmol/L (ref 98–111)
Creatinine, Ser: 0.98 mg/dL (ref 0.61–1.24)
GFR, Estimated: >60 mL/min (ref >60)
Glucose, Bld: 138 mg/dL — ABNORMAL HIGH (ref 70–99)
Potassium: 4.1 mmol/L (ref 3.5–5.1)
Sodium: 134 mmol/L — ABNORMAL LOW (ref 135–145)

## 2022-12-08 LAB — CBC
HCT: 43.8 % (ref 39.0–52.0)
Hemoglobin: 15.5 g/dL (ref 13.0–17.0)
MCH: 32.3 pg (ref 26.0–34.0)
MCHC: 35.4 g/dL (ref 30.0–36.0)
MCV: 91.3 fL (ref 80.0–100.0)
Platelets: 149 10*3/uL — ABNORMAL LOW (ref 150–400)
RBC: 4.8 MIL/uL (ref 4.22–5.81)
RDW: 12.2 % (ref 11.5–15.5)
WBC: 3.8 10*3/uL — ABNORMAL LOW (ref 4.0–10.5)
nRBC: 0 % (ref 0.0–0.2)

## 2022-12-08 LAB — ECHO TEE

## 2022-12-08 LAB — ABO/RH: ABO/RH(D): A POS

## 2022-12-08 LAB — GLUCOSE, CAPILLARY: Glucose-Capillary: 144 mg/dL — ABNORMAL HIGH (ref 70–99)

## 2022-12-08 SURGERY — ATRIAL FIBRILLATION ABLATION
Anesthesia: General

## 2022-12-08 MED ORDER — ROCURONIUM BROMIDE 10 MG/ML (PF) SYRINGE
PREFILLED_SYRINGE | INTRAVENOUS | Status: DC | PRN
  Administered 2022-12-08: 20 mg via INTRAVENOUS
  Administered 2022-12-08: 50 mg via INTRAVENOUS
  Administered 2022-12-08: 80 mg via INTRAVENOUS

## 2022-12-08 MED ORDER — HEPARIN SODIUM (PORCINE) 1000 UNIT/ML IJ SOLN
INTRAMUSCULAR | Status: AC
  Filled 2022-12-08: qty 10

## 2022-12-08 MED ORDER — ACETAMINOPHEN 325 MG PO TABS
650.0000 mg | ORAL_TABLET | ORAL | Status: DC | PRN
Start: 2022-12-08 — End: 2022-12-09

## 2022-12-08 MED ORDER — MIDAZOLAM HCL 2 MG/2ML IJ SOLN
INTRAMUSCULAR | Status: DC | PRN
  Administered 2022-12-08: 1 mg via INTRAVENOUS
  Administered 2022-12-08: 2 mg via INTRAVENOUS

## 2022-12-08 MED ORDER — CEFAZOLIN SODIUM-DEXTROSE 2-4 GM/100ML-% IV SOLN
2.0000 g | INTRAVENOUS | Status: AC
  Administered 2022-12-08: 2 g via INTRAVENOUS

## 2022-12-08 MED ORDER — SODIUM CHLORIDE 0.9 % IV SOLN: INTRAVENOUS | Status: DC

## 2022-12-08 MED ORDER — PHENYLEPHRINE 80 MCG/ML (10ML) SYRINGE FOR IV PUSH (FOR BLOOD PRESSURE SUPPORT)
PREFILLED_SYRINGE | INTRAVENOUS | Status: DC | PRN
  Administered 2022-12-08: 160 ug via INTRAVENOUS

## 2022-12-08 MED ORDER — PROPOFOL 10 MG/ML IV BOLUS
INTRAVENOUS | Status: DC | PRN
  Administered 2022-12-08: 150 mg via INTRAVENOUS

## 2022-12-08 MED ORDER — FENTANYL CITRATE (PF) 100 MCG/2ML IJ SOLN
INTRAMUSCULAR | Status: AC
  Filled 2022-12-08: qty 2

## 2022-12-08 MED ORDER — CEFAZOLIN SODIUM-DEXTROSE 2-4 GM/100ML-% IV SOLN
INTRAVENOUS | Status: AC
  Filled 2022-12-08: qty 100

## 2022-12-08 MED ORDER — SUGAMMADEX SODIUM 200 MG/2ML IV SOLN
INTRAVENOUS | Status: DC | PRN
  Administered 2022-12-08: 200 mg via INTRAVENOUS

## 2022-12-08 MED ORDER — DEXAMETHASONE SODIUM PHOSPHATE 10 MG/ML IJ SOLN
INTRAMUSCULAR | Status: DC | PRN
  Administered 2022-12-08: 10 mg via INTRAVENOUS

## 2022-12-08 MED ORDER — HEPARIN SODIUM (PORCINE) 1000 UNIT/ML IJ SOLN
INTRAMUSCULAR | Status: DC | PRN
  Administered 2022-12-08: 18000 [IU] via INTRAVENOUS
  Administered 2022-12-08: 4000 [IU] via INTRAVENOUS

## 2022-12-08 MED ORDER — FENTANYL CITRATE (PF) 250 MCG/5ML IJ SOLN
INTRAMUSCULAR | Status: DC | PRN
  Administered 2022-12-08: 50 ug via INTRAVENOUS

## 2022-12-08 MED ORDER — ONDANSETRON HCL 4 MG/2ML IJ SOLN: 4.0000 mg | Freq: Four times a day (QID) | INTRAMUSCULAR | Status: DC | PRN

## 2022-12-08 MED ORDER — RIVAROXABAN 20 MG PO TABS
20.0000 mg | ORAL_TABLET | Freq: Every day | ORAL | Status: DC
  Administered 2022-12-08: 20 mg via ORAL
  Filled 2022-12-08: qty 1

## 2022-12-08 MED ORDER — MIDAZOLAM HCL 5 MG/5ML IJ SOLN
INTRAMUSCULAR | Status: AC
  Filled 2022-12-08: qty 5

## 2022-12-08 MED ORDER — ATROPINE SULFATE 1 MG/10ML IJ SOSY
PREFILLED_SYRINGE | INTRAMUSCULAR | Status: DC | PRN
  Administered 2022-12-08: 1 mg via INTRAVENOUS

## 2022-12-08 MED ORDER — HEPARIN SODIUM (PORCINE) 1000 UNIT/ML IJ SOLN
INTRAMUSCULAR | Status: AC
  Filled 2022-12-08: qty 20

## 2022-12-08 MED ORDER — SODIUM CHLORIDE 0.9% FLUSH
3.0000 mL | Freq: Two times a day (BID) | INTRAVENOUS | Status: DC
  Administered 2022-12-08 – 2022-12-09 (×2): 3 mL via INTRAVENOUS

## 2022-12-08 MED ORDER — SODIUM CHLORIDE 0.9% FLUSH: 3.0000 mL | INTRAVENOUS | Status: DC | PRN

## 2022-12-08 MED ORDER — PROTAMINE SULFATE 10 MG/ML IV SOLN
INTRAVENOUS | Status: DC | PRN
  Administered 2022-12-08: 30 mg via INTRAVENOUS

## 2022-12-08 MED ORDER — DOFETILIDE 500 MCG PO CAPS
500.0000 ug | ORAL_CAPSULE | Freq: Two times a day (BID) | ORAL | Status: DC
  Administered 2022-12-08 – 2022-12-09 (×2): 500 ug via ORAL
  Filled 2022-12-08 (×2): qty 1

## 2022-12-08 MED ORDER — HEPARIN (PORCINE) IN NACL 1000-0.9 UT/500ML-% IV SOLN
INTRAVENOUS | Status: DC | PRN
  Administered 2022-12-08 (×3): 500 mL

## 2022-12-08 MED ORDER — CARVEDILOL 25 MG PO TABS
25.0000 mg | ORAL_TABLET | Freq: Two times a day (BID) | ORAL | Status: DC
  Administered 2022-12-08 – 2022-12-09 (×2): 25 mg via ORAL
  Filled 2022-12-08 (×2): qty 1

## 2022-12-08 MED ORDER — SPIRONOLACTONE 25 MG PO TABS
25.0000 mg | ORAL_TABLET | Freq: Every day | ORAL | Status: DC
  Administered 2022-12-09: 25 mg via ORAL
  Filled 2022-12-08: qty 1

## 2022-12-08 MED ORDER — PREGABALIN 50 MG PO CAPS
150.0000 mg | ORAL_CAPSULE | Freq: Two times a day (BID) | ORAL | Status: DC
  Administered 2022-12-08 – 2022-12-09 (×2): 150 mg via ORAL
  Filled 2022-12-08 (×2): qty 2

## 2022-12-08 MED ORDER — AMLODIPINE BESYLATE 5 MG PO TABS
5.0000 mg | ORAL_TABLET | Freq: Every day | ORAL | Status: DC
  Administered 2022-12-09: 5 mg via ORAL
  Filled 2022-12-08: qty 1

## 2022-12-08 MED ORDER — PANTOPRAZOLE SODIUM 40 MG PO TBEC
40.0000 mg | DELAYED_RELEASE_TABLET | Freq: Every day | ORAL | Status: DC
  Administered 2022-12-08 – 2022-12-09 (×2): 40 mg via ORAL
  Filled 2022-12-08 (×2): qty 1

## 2022-12-08 MED ORDER — PHENYLEPHRINE HCL-NACL 20-0.9 MG/250ML-% IV SOLN
INTRAVENOUS | Status: DC | PRN
  Administered 2022-12-08: 25 ug/min via INTRAVENOUS

## 2022-12-08 MED ORDER — COLCHICINE 0.6 MG PO TABS
0.6000 mg | ORAL_TABLET | Freq: Two times a day (BID) | ORAL | Status: DC
  Administered 2022-12-08 – 2022-12-09 (×2): 0.6 mg via ORAL
  Filled 2022-12-08 (×2): qty 1

## 2022-12-08 MED ORDER — IOHEXOL 350 MG/ML SOLN
INTRAVENOUS | Status: DC | PRN
  Administered 2022-12-08: 10 mL

## 2022-12-08 MED ORDER — ONDANSETRON HCL 4 MG/2ML IJ SOLN
INTRAMUSCULAR | Status: DC | PRN
  Administered 2022-12-08: 4 mg via INTRAVENOUS

## 2022-12-08 MED ORDER — DEXMEDETOMIDINE HCL IN NACL 200 MCG/50ML IV SOLN
INTRAVENOUS | Status: DC | PRN
  Administered 2022-12-08: 12 ug via INTRAVENOUS

## 2022-12-08 MED ORDER — SODIUM CHLORIDE 0.9 % IV SOLN: 250.0000 mL | INTRAVENOUS | Status: DC | PRN

## 2022-12-08 MED ORDER — CARMEX CLASSIC LIP BALM EX OINT
TOPICAL_OINTMENT | CUTANEOUS | Status: DC | PRN
  Filled 2022-12-08: qty 10

## 2022-12-08 MED ORDER — EMPAGLIFLOZIN 25 MG PO TABS
25.0000 mg | ORAL_TABLET | Freq: Every morning | ORAL | Status: DC
  Administered 2022-12-09: 25 mg via ORAL
  Filled 2022-12-08: qty 1

## 2022-12-08 SURGICAL SUPPLY — 30 items
BAG SNAP BAND KOVER 36X36 (MISCELLANEOUS) IMPLANT
BLANKET WARM UNDERBOD FULL ACC (MISCELLANEOUS) ×1 IMPLANT
CABLE PFA RX CATH CONN (CABLE) IMPLANT
CATH DXT MULTI JL4 JR4 ANG PIG (CATHETERS) IMPLANT
CATH FARAWAVE ABLATION 31 (CATHETERS) IMPLANT
CATH OCTARAY 2.0 F 3-3-3-3-3 (CATHETERS) IMPLANT
CATH SOUNDSTAR ECO 8FR (CATHETERS) IMPLANT
CATH WEB BI DIR CSDF CRV REPRO (CATHETERS) IMPLANT
CLOSURE PERCLOSE PROSTYLE (VASCULAR PRODUCTS) IMPLANT
COVER SWIFTLINK CONNECTOR (BAG) ×1 IMPLANT
DEVICE WATCHMAN FLX PRO PROC (KITS) IMPLANT
DEVICE WATCHMAN FXD CRV PROC (KITS) IMPLANT
DILATOR VESSEL 38 20CM 16FR (INTRODUCER) IMPLANT
GUIDEWIRE INQWIRE 1.5J.035X260 (WIRE) IMPLANT
INQWIRE 1.5J .035X260CM (WIRE) ×1 IMPLANT
KIT VERSACROSS CNCT FARADRIVE (KITS) IMPLANT
PACK EP LF (CUSTOM PROCEDURE TRAY) ×1 IMPLANT
PAD DEFIB RADIO PHYSIO CONN (PAD) ×1 IMPLANT
PATCH CARTO3 (PAD) IMPLANT
SHEATH FARADRIVE STEERABLE (SHEATH) IMPLANT
SHEATH PINNACLE 8F 10CM (SHEATH) IMPLANT
SHEATH PINNACLE 9F 10CM (SHEATH) IMPLANT
SHEATH PROBE COVER 6X72 (BAG) ×1 IMPLANT
SYR CONTROL 10ML ANGIOGRAPHIC (SYRINGE) IMPLANT
SYS WATCHMAN FXD DBL (SHEATH) ×1 IMPLANT
SYSTEM WATCHMAN FXD DBL (SHEATH) IMPLANT
TRANSDUCER W/STOPCOCK (MISCELLANEOUS) ×1 IMPLANT
WATCHMAN FLX PRO 27 (Prosthesis & Implant Heart) IMPLANT
WATCHMAN FLX PRO PROCEDURE (KITS) ×1 IMPLANT
WATCHMAN FXD CRV SYS PROCEDURE (KITS) ×1 IMPLANT

## 2022-12-08 NOTE — Progress Notes (Signed)
  [facility]   WATCHMAN TEAM  Patient doing well s/p LAAO. He is hemodynamically stable. Groin site remains stable. Plan early ambulation after bedrest completed and hopeful discharge tomorrow morning.   Georgie Chard NP-C Structural Heart Team  Phone: [phone]

## 2022-12-08 NOTE — Discharge Instructions (Addendum)
Cardiac Ablation, Care After  This sheet gives you information about how to care for yourself after your procedure. Your health care provider may also give you more specific instructions. If you have problems or questions, contact your health care provider. What can I expect after the procedure? After the procedure, it is common to have: Bruising around your puncture site. Tenderness around your puncture site. Skipped heartbeats. If you had an atrial fibrillation ablation, you may have atrial fibrillation during the first several months after your procedure.  Tiredness (fatigue).  Follow these instructions at home: Puncture site care  Follow instructions from your health care provider about how to take care of your puncture site. Make sure you: If present, leave stitches (sutures), skin glue, or adhesive strips in place. These skin closures may need to stay in place for up to 2 weeks. If adhesive strip edges start to loosen and curl up, you may trim the loose edges. Do not remove adhesive strips completely unless your health care provider tells you to do that. If a large square bandage is present, this may be removed 24 hours after surgery.  Check your puncture site every day for signs of infection. Check for: Redness, swelling, or pain. Fluid or blood. If your puncture site starts to bleed, lie down on your back, apply firm pressure to the area, and contact your health care provider. Warmth. Pus or a bad smell. A pea or small marble sized lump at the site is normal and can take up to three months to resolve.  Driving Do not drive for at least 4 days after your procedure or however long your health care provider recommends. (Do not resume driving if you have previously been instructed not to drive for other health reasons.) Do not drive or use heavy machinery while taking prescription pain medicine. Activity Avoid activities that take a lot of effort for at least 7 days after your  procedure. Do not lift anything that is heavier than 5 lb (4.5 kg) for one week.  No sexual activity for 1 week.  Return to your normal activities as told by your health care provider. Ask your health care provider what activities are safe for you. General instructions Take over-the-counter and prescription medicines only as told by your health care provider. Do not use any products that contain nicotine or tobacco, such as cigarettes and e-cigarettes. If you need help quitting, ask your health care provider. You may shower after 24 hours, but Do not take baths, swim, or use a hot tub for 1 week.  Do not drink alcohol for 24 hours after your procedure. Keep all follow-up visits as told by your health care provider. This is important. Contact a health care provider if: You have redness, mild swelling, or pain around your puncture site. You have fluid or blood coming from your puncture site that stops after applying firm pressure to the area. Your puncture site feels warm to the touch. You have pus or a bad smell coming from your puncture site. You have a fever. You have chest pain or discomfort that spreads to your neck, jaw, or arm. You have chest pain that is worse with lying on your back or taking a deep breath. You are sweating a lot. You feel nauseous. You have a fast or irregular heartbeat. You have shortness of breath. You are dizzy or light-headed and feel the need to lie down. You have pain or numbness in the arm or leg closest to your puncture  site. Get help right away if: Your puncture site suddenly swells. Your puncture site is bleeding and the bleeding does not stop after applying firm pressure to the area. These symptoms may represent a serious problem that is an emergency. Do not wait to see if the symptoms will go away. Get medical help right away. Call your local emergency services (911 in the [place] Do not drive yourself to the hospital. Summary After the procedure, it  is normal to have bruising and tenderness at the puncture site in your groin, neck, or forearm. Check your puncture site every day for signs of infection. Get help right away if your puncture site is bleeding and the bleeding does not stop after applying firm pressure to the area. This is a medical emergency. This information is not intended to replace advice given to you by your health care provider. Make sure you discuss any questions you have with your health care provider.  -----------------------------------------------------------------------------------------------------------------------------------------------------------------------------------------------------------------------------------------------------------------------------------------------  [facility] Procedure, Care After  Procedure MD: Dr. Isidoro Donning Clinical Coordinator: Karsten Fells, RN  This sheet gives you information about how to care for yourself after your procedure. Your health care provider may also give you more specific instructions. If you have problems or questions, contact your health care provider.  What can I expect after the procedure? After the procedure, it is common to have: Bruising around your puncture site. Tenderness around your puncture site. Tiredness (fatigue).  Medication instructions It is very important to continue to take your blood thinner as directed by your doctor after the Watchman procedure. Call your procedure doctor's office with question or concerns. If you are on Coumadin (warfarin), you will have your INR checked the week after your procedure, with a goal INR of 2.0 - 3.0. Please follow your medication instructions on your discharge summary. Only take the medications listed on your discharge paperwork.  Follow up You will be seen in 1 month after your procedure You will have a repeat CT scan approximately 8 weeks after your procedure mark to check your device You will  follow up the MD/APP who performed your procedure 6 months after your procedure The [facility]man Clinical Coordinator will check in with you from time to time, including 1 and 2 years after your procedure.    Follow these instructions at home: Puncture site care  Follow instructions from your health care provider about how to take care of your puncture site. Make sure you: If present, leave stitches (sutures), skin glue, or adhesive strips in place.  If a large square bandage is present, this may be removed 24 hours after surgery.  Check your puncture site every day for signs of infection. Check for: Redness, swelling, or pain. Fluid or blood. If your puncture site starts to bleed, lie down on your back, apply firm pressure to the area, and contact your health care provider. Warmth. Pus or a bad smell. Driving Do not drive yourself home if you received sedation Do not drive for at least 4 days after your procedure or however long your health care provider recommends. (Do not resume driving if you have previously been instructed not to drive for other health reasons.) Do not spend greater than 1 hour at a time in a car for the first 3 days. Stop and take a break with a 5 minute walk at least every hour.  Do not drive or use heavy machinery while taking prescription pain medicine.  Activity Avoid activities that take a lot of effort, including exercise,  for at least 7 days after your procedure. For the first 3 days, avoid sitting for longer than one hour at a time.  Avoid alcoholic beverages, signing paperwork, or participating in legal proceedings for 24 hours after receiving sedation Do not lift anything that is heavier than 10 lb (4.5 kg) for one week.  No sexual activity for 1 week.  Return to your normal activities as told by your health care provider. Ask your health care provider what activities are safe for you. General instructions Take over-the-counter and prescription medicines only  as told by your health care provider. Do not use any products that contain nicotine or tobacco, such as cigarettes and e-cigarettes. If you need help quitting, ask your health care provider. You may shower after 24 hours, but Do not take baths, swim, or use a hot tub for 1 week.  Do not drink alcohol for 24 hours after your procedure. Keep all follow-up visits as told by your health care provider. This is important. Dental Work: You will require antibiotics prior to any dental work, including cleanings, for 6 months after your Watchman implantation to help protect you from infection. After 6 months, antibiotics are no longer required. Contact a health care provider if: You have redness, mild swelling, or pain around your puncture site. You have soreness in your throat or at your puncture site that does not improve after several days You have fluid or blood coming from your puncture site that stops after applying firm pressure to the area. Your puncture site feels warm to the touch. You have pus or a bad smell coming from your puncture site. You have a fever. You have chest pain or discomfort that spreads to your neck, jaw, or arm. You are sweating a lot. You feel nauseous. You have a fast or irregular heartbeat. You have shortness of breath. You are dizzy or light-headed and feel the need to lie down. You have pain or numbness in the arm or leg closest to your puncture site. Get help right away if: Your puncture site suddenly swells. Your puncture site is bleeding and the bleeding does not stop after applying firm pressure to the area. These symptoms may represent a serious problem that is an emergency. Do not wait to see if the symptoms will go away. Get medical help right away. Call your local emergency services (911 in the [place] Do not drive yourself to the hospital. Summary After the procedure, it is normal to have bruising and tenderness at the puncture site in your groin, neck, or  forearm. Check your puncture site every day for signs of infection. Get help right away if your puncture site is bleeding and the bleeding does not stop after applying firm pressure to the area. This is a medical emergency.  This information is not intended to replace advice given to you by your health care provider. Make sure you discuss any questions you have with your health care provider.      [facility] Procedure, Care After  Procedure MD: Dr. Isidoro Donning Clinical Coordinator: Karsten Fells, RN  This sheet gives you information about how to care for yourself after your procedure. Your health care provider may also give you more specific instructions. If you have problems or questions, contact your health care provider.  What can I expect after the procedure? After the procedure, it is common to have: Bruising around your puncture site. Tenderness around your puncture site. Tiredness (fatigue).  Medication instructions It is very important to  continue to take your blood thinner as directed by your doctor after the Watchman procedure. Call your procedure doctor's office with question or concerns. If you are on Coumadin (warfarin), you will have your INR checked the week after your procedure, with a goal INR of 2.0 - 3.0. Please follow your medication instructions on your discharge summary. Only take the medications listed on your discharge paperwork.  Follow up You will be seen in 1 month after your procedure You will have a repeat CT scan approximately 8 weeks after your procedure mark to check your device You will follow up the MD/APP who performed your procedure 6 months after your procedure The [facility]man Clinical Coordinator will check in with you from time to time, including 1 and 2 years after your procedure.    Follow these instructions at home: Puncture site care  Follow instructions from your health care provider about how to take care of your puncture site. Make sure  you: If present, leave stitches (sutures), skin glue, or adhesive strips in place.  If a large square bandage is present, this may be removed 24 hours after surgery.  Check your puncture site every day for signs of infection. Check for: Redness, swelling, or pain. Fluid or blood. If your puncture site starts to bleed, lie down on your back, apply firm pressure to the area, and contact your health care provider. Warmth. Pus or a bad smell. Driving Do not drive yourself home if you received sedation Do not drive for at least 4 days after your procedure or however long your health care provider recommends. (Do not resume driving if you have previously been instructed not to drive for other health reasons.) Do not spend greater than 1 hour at a time in a car for the first 3 days. Stop and take a break with a 5 minute walk at least every hour.  Do not drive or use heavy machinery while taking prescription pain medicine.  Activity Avoid activities that take a lot of effort, including exercise, for at least 7 days after your procedure. For the first 3 days, avoid sitting for longer than one hour at a time.  Avoid alcoholic beverages, signing paperwork, or participating in legal proceedings for 24 hours after receiving sedation Do not lift anything that is heavier than 10 lb (4.5 kg) for one week.  No sexual activity for 1 week.  Return to your normal activities as told by your health care provider. Ask your health care provider what activities are safe for you. General instructions Take over-the-counter and prescription medicines only as told by your health care provider. Do not use any products that contain nicotine or tobacco, such as cigarettes and e-cigarettes. If you need help quitting, ask your health care provider. You may shower after 24 hours, but Do not take baths, swim, or use a hot tub for 1 week.  Do not drink alcohol for 24 hours after your procedure. Keep all follow-up visits as  told by your health care provider. This is important. Dental Work: You will require antibiotics prior to any dental work, including cleanings, for 6 months after your Watchman implantation to help protect you from infection. After 6 months, antibiotics are no longer required. Contact a health care provider if: You have redness, mild swelling, or pain around your puncture site. You have soreness in your throat or at your puncture site that does not improve after several days You have fluid or blood coming from your puncture site that stops  after applying firm pressure to the area. Your puncture site feels warm to the touch. You have pus or a bad smell coming from your puncture site. You have a fever. You have chest pain or discomfort that spreads to your neck, jaw, or arm. You are sweating a lot. You feel nauseous. You have a fast or irregular heartbeat. You have shortness of breath. You are dizzy or light-headed and feel the need to lie down. You have pain or numbness in the arm or leg closest to your puncture site. Get help right away if: Your puncture site suddenly swells. Your puncture site is bleeding and the bleeding does not stop after applying firm pressure to the area. These symptoms may represent a serious problem that is an emergency. Do not wait to see if the symptoms will go away. Get medical help right away. Call your local emergency services (911 in the [place] Do not drive yourself to the hospital. Summary After the procedure, it is normal to have bruising and tenderness at the puncture site in your groin, neck, or forearm. Check your puncture site every day for signs of infection. Get help right away if your puncture site is bleeding and the bleeding does not stop after applying firm pressure to the area. This is a medical emergency.  This information is not intended to replace advice given to you by your health care provider. Make sure you discuss any questions you have with  your health care provider.     [facility] Procedure, Care After  Procedure MD: Dr. Isidoro Donning Clinical Coordinator: Karsten Fells, RN  This sheet gives you information about how to care for yourself after your procedure. Your health care provider may also give you more specific instructions. If you have problems or questions, contact your health care provider.  What can I expect after the procedure? After the procedure, it is common to have: Bruising around your puncture site. Tenderness around your puncture site. Tiredness (fatigue).  Medication instructions It is very important to continue to take your blood thinner as directed by your doctor after the Watchman procedure. Call your procedure doctor's office with question or concerns. If you are on Coumadin (warfarin), you will have your INR checked the week after your procedure, with a goal INR of 2.0 - 3.0. Please follow your medication instructions on your discharge summary. Only take the medications listed on your discharge paperwork.  Follow up You will be seen in 1 month after your procedure You will have a repeat CT scan approximately 8 weeks after your procedure mark to check your device You will follow up the MD/APP who performed your procedure 6 months after your procedure The [facility]man Clinical Coordinator will check in with you from time to time, including 1 and 2 years after your procedure.    Follow these instructions at home: Puncture site care  Follow instructions from your health care provider about how to take care of your puncture site. Make sure you: If present, leave stitches (sutures), skin glue, or adhesive strips in place.  If a large square bandage is present, this may be removed 24 hours after surgery.  Check your puncture site every day for signs of infection. Check for: Redness, swelling, or pain. Fluid or blood. If your puncture site starts to bleed, lie down on your back, apply firm pressure to the  area, and contact your health care provider. Warmth. Pus or a bad smell. Driving Do not drive yourself home if you received sedation  Do not drive for at least 4 days after your procedure or however long your health care provider recommends. (Do not resume driving if you have previously been instructed not to drive for other health reasons.) Do not spend greater than 1 hour at a time in a car for the first 3 days. Stop and take a break with a 5 minute walk at least every hour.  Do not drive or use heavy machinery while taking prescription pain medicine.  Activity Avoid activities that take a lot of effort, including exercise, for at least 7 days after your procedure. For the first 3 days, avoid sitting for longer than one hour at a time.  Avoid alcoholic beverages, signing paperwork, or participating in legal proceedings for 24 hours after receiving sedation Do not lift anything that is heavier than 10 lb (4.5 kg) for one week.  No sexual activity for 1 week.  Return to your normal activities as told by your health care provider. Ask your health care provider what activities are safe for you. General instructions Take over-the-counter and prescription medicines only as told by your health care provider. Do not use any products that contain nicotine or tobacco, such as cigarettes and e-cigarettes. If you need help quitting, ask your health care provider. You may shower after 24 hours, but Do not take baths, swim, or use a hot tub for 1 week.  Do not drink alcohol for 24 hours after your procedure. Keep all follow-up visits as told by your health care provider. This is important. Dental Work: You will require antibiotics prior to any dental work, including cleanings, for 6 months after your Watchman implantation to help protect you from infection. After 6 months, antibiotics are no longer required. Contact a health care provider if: You have redness, mild swelling, or pain around your puncture  site. You have soreness in your throat or at your puncture site that does not improve after several days You have fluid or blood coming from your puncture site that stops after applying firm pressure to the area. Your puncture site feels warm to the touch. You have pus or a bad smell coming from your puncture site. You have a fever. You have chest pain or discomfort that spreads to your neck, jaw, or arm. You are sweating a lot. You feel nauseous. You have a fast or irregular heartbeat. You have shortness of breath. You are dizzy or light-headed and feel the need to lie down. You have pain or numbness in the arm or leg closest to your puncture site. Get help right away if: Your puncture site suddenly swells. Your puncture site is bleeding and the bleeding does not stop after applying firm pressure to the area. These symptoms may represent a serious problem that is an emergency. Do not wait to see if the symptoms will go away. Get medical help right away. Call your local emergency services (911 in the [place] Do not drive yourself to the hospital. Summary After the procedure, it is normal to have bruising and tenderness at the puncture site in your groin, neck, or forearm. Check your puncture site every day for signs of infection. Get help right away if your puncture site is bleeding and the bleeding does not stop after applying firm pressure to the area. This is a medical emergency.  This information is not intended to replace advice given to you by your health care provider. Make sure you discuss any questions you have with your health care provider.

## 2022-12-08 NOTE — Anesthesia Procedure Notes (Signed)
Procedure Name: Intubation Date/Time: 12/08/2022 10:34 AM  Performed by: Ammie Dalton, CRNAPre-anesthesia Checklist: Patient identified, Emergency Drugs available, Suction available and Patient being monitored Patient Re-evaluated:Patient Re-evaluated prior to induction Oxygen Delivery Method: Circle System Utilized Preoxygenation: Pre-oxygenation with 100% oxygen Induction Type: IV induction Ventilation: Mask ventilation without difficulty Laryngoscope Size: Glidescope and 4 (LoPro) Grade View: Grade I Tube type: Oral Tube size: 8.0 mm Number of attempts: 1 Airway Equipment and Method: Rigid stylet Placement Confirmation: ETT inserted through vocal cords under direct vision, positive ETCO2 and breath sounds checked- equal and bilateral Secured at: 23 cm Tube secured with: Tape Dental Injury: Teeth and Oropharynx as per pre-operative assessment

## 2022-12-08 NOTE — H&P (Signed)
Electrophysiology Office Note:     Date:  12/08/2022    ID:  Ethan Pitts, DOB 04/25/1960, MRN [id number]   [facility] Cardiologist:  Ethan Millers, MD  [facility] Electrophysiologist:  Ethan Bunting, MD    Referring MD: Ethan Manson, NP    Chief Complaint: Atrial fibrillation   History of Present Illness:     Ethan Pitts is a 62 y.o. malewho I am seeing today for an evaluation of atrial fibrillation at the request of Ethan Pitts.   The patient was last seen by Ethan Pitts on Ethan 3, 2024.   The patient has a medical history that includes atrial flutter post ablation, paroxysmal atrial fibrillation, obstructive sleep apnea, hypertension, diabetes.  He is maintained on Tikosyn for rhythm control.  The patient was hospitalized in February of this year with pneumonia.  In the setting of the pneumonia he was in atrial fibrillation but converted while hospitalized back to sinus rhythm.  The patient works on a farm and is concerned about his elevated risk of bleeding.  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  He is interested in left atrial appendage occlusion if he is a candidate.  He is also interested in pursuing rhythm control for his atrial fibrillation.  He is interested in avoiding exposure medications if at all possible.  He is on [facility] and has lost a significant amount of weight (at least 30 pounds).   Plan for PVI and Watchman today. Procedure reviewed.     Objective Their past medical, social and family history was reveiwed.     ROS:   Please see the history of present illness.    All other systems reviewed and are negative.   EKGs/Labs/Other Studies Reviewed:     The following studies were reviewed today:   February 16, 2022 echo EF 50 to 55%. RV normal No MR Trivial TR     EKG Interpretation Date/Time:                  Thursday August 03 2022 14:43:16 EDT Ventricular Rate:         69 PR  Interval:                 186 QRS Duration:             116 QT Interval:                 434 QTC Calculation:465 R Axis:                         6   Text Interpretation:Normal sinus rhythm Confirmed by Ethan Pitts [phone]) on 08/03/2022 3:14:11 PM      Physical Exam:     VS:  BP 115/72   Pulse 70   Ht 6' (1.829 m)   Wt 264 lb (119.7 kg)   SpO2 94%   BMI 35.80 kg/m         Wt Readings from Last 3 Encounters:  08/03/22 264 lb (119.7 kg)  05/19/22 272 lb 12.8 oz (123.7 kg)  04/05/22 277 lb (125.6 kg)      GEN:  Well nourished, well developed in no acute distress.  Obese CARDIAC: RRR, no murmurs, rubs, gallops.  RESPIRATORY:  Clear to auscultation without rales, wheezing or rhonchi          Assessment ASSESSMENT AND PLAN:     1. Persistent  atrial fibrillation (HCC)   2. Encounter for long-term (current) use of high-risk medication   3. Morbid obesity (HCC)   4. Chronic systolic heart failure (HCC)       #Persistent atrial fibrillation #High risk med monitoring-Tikosyn The patient has a history of atrial flutter post ablation.  He then developed atrial fibrillation that is now being managed with Tikosyn. QTc today acceptable for ongoing Tikosyn use. Rhythm control is indicated given his history of chronic systolic heart failure.  He is continue to have breakthrough episodes of atrial fibrillation despite treatment Tikosyn. 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     ---------------------------   Discussed treatment options today for AF including antiarrhythmic drug therapy and ablation. Discussed risks, recovery and likelihood of success with each treatment strategy. Risk, benefits, and alternatives to EP study and ablation for afib were discussed. These risks include but are not limited to stroke, bleeding, vascular damage, tamponade, perforation, damage to the esophagus, lungs,  phrenic nerve and other structures, pulmonary vein stenosis, worsening renal function, coronary vasospasm and death.  Discussed potential need for repeat ablation procedures and antiarrhythmic drugs after an initial ablation. The patient understands these risk and wishes to proceed.  We will therefore proceed with catheter ablation at the next available time.  Carto, ICE, anesthesia are requested for the procedure.  Will also obtain CT PV protocol prior to the procedure to exclude LAA thrombus and further evaluate atrial anatomy.   -----------------------------   I have seen Ethan Pitts in the office today who is being considered for a Watchman left atrial appendage closure device. I believe they will benefit from this procedure given their history of atrial fibrillation, CHA2DS2-VASc score of 3 and unadjusted ischemic stroke rate of 3.2% per year. 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 The patient's chart has been reviewed and I feel that they would be a candidate for short term oral anticoagulation after Watchman implant.    It is my belief that after undergoing a LAA closure procedure, Ethan Pitts will not need long term anticoagulation which eliminates anticoagulation side effects and major bleeding risk.    Procedural risks for the Watchman implant have been reviewed with the patient including a 0.5% risk of stroke, <1% risk of perforation and <1% risk of device embolization. Other risks include bleeding, vascular damage, tamponade, worsening renal function, and death. The patient understands these risk and wishes to proceed.       The published clinical data on the safety and effectiveness of WATCHMAN include but are not limited to the following: - Ethan Pitts, Ethan Pitts, Ethan Pitts et al. for the PROTECT AF Investigators. Percutaneous closure of the left  atrial appendage versus warfarin therapy for prevention of stroke in patients with atrial fibrillation: a randomised non-inferiority trial. Lancet 2009; 374: 534-42. Ethan Pitts, Doshi SK, Isa Rankin D et al. on behalf of the PROTECT AF Investigators. Percutaneous Left Atrial Appendage Closure for Stroke Prophylaxis in Patients With Atrial Fibrillation 2.3-Year Follow-up of the PROTECT AF (Watchman Left Atrial Appendage System for Embolic Protection in Patients With Atrial Fibrillation) Trial. Circulation 2013; 127:720-729. - Alli O, Doshi S,  Kar S, Reddy VY, Sievert H et al. Quality of Life Assessment in the Randomized PROTECT AF (Percutaneous Closure of the  Left Atrial Appendage Versus Warfarin Therapy for Prevention of Stroke in Patients With Atrial Fibrillation) Trial of Patients at Risk for Stroke With Nonvalvular Atrial Fibrillation. J Am Coll Cardiol 2013; 61:1790-8. Aline August Pitts, Mia Creek, Price M, Whisenant B, Sievert H, Doshi S, Huber K, Reddy V. Prospective randomized evaluation of the Watchman left atrial appendage Device in patients with atrial fibrillation versus long-term warfarin therapy; the PREVAIL trial. Journal of the [employer] of Cardiology, Vol. 4, No. 1, 2014, 1-11. - Kar S, Doshi SK, Sadhu A, Horton R, Osorio J et al. Primary outcome evaluation of a next-generation left atrial appendage closure device: results from the PINNACLE FLX trial. Circulation 2021;143(18)1754-1762.      After today's visit with the patient which was dedicated solely for shared decision making visit regarding LAA closure device, the patient decided to proceed with the LAA appendage closure procedure scheduled to be done in the near future at [facility]. Prior to the procedure, I would like to obtain a gated CT scan of the chest with contrast timed for PV/LA visualization.      HAS-BLED score 1 Hypertension Yes  Abnormal renal and liver function (Dialysis, transplant, Cr >2.26 mg/dL  /Cirrhosis or Bilirubin >2x Normal or AST/ALT/AP >3x Normal) No  Stroke No  Bleeding No  Labile INR (Unstable/high INR) No  Elderly (>65) No  Drugs or alcohol (>= 8 drinks/week, anti-plt or NSAID) No    CHA2DS2-VASc Score = 3  The patient's score is based upon: CHF History: 1 HTN History: 1 Diabetes History: 1 Stroke History: 0 Vascular Disease History: 0 Age Score: 0 Gender Score: 0   Plan for PVI and Watchman today. Procedures reviewed.     Signed, Rossie Muskrat. Lalla Brothers, MD, [facility] 12/08/2022 Electrophysiology [facility]

## 2022-12-08 NOTE — Anesthesia Postprocedure Evaluation (Signed)
Anesthesia Post Note  Patient: Ethan Pitts  Procedure(s) Performed: ATRIAL FIBRILLATION ABLATION LEFT ATRIAL APPENDAGE OCCLUSION TRANSESOPHAGEAL ECHOCARDIOGRAM     Patient location during evaluation: PACU Anesthesia Type: General Level of consciousness: awake and alert and oriented Pain management: pain level controlled Vital Signs Assessment: post-procedure vital signs reviewed and stable Respiratory status: spontaneous breathing, nonlabored ventilation and respiratory function stable Cardiovascular status: blood pressure returned to baseline and stable Postop Assessment: no apparent nausea or vomiting Anesthetic complications: no   There were no known notable events for this encounter.  Last Vitals:  Vitals:   12/08/22 1415 12/08/22 1453  BP: 101/65 112/72  Pulse: 64 62  Resp: 20 20  Temp:  37.3 C  SpO2: 91% 93%    Last Pain:  Vitals:   12/08/22 1453  TempSrc: Oral  PainSc:                  Dreanna Kyllo A.

## 2022-12-08 NOTE — Anesthesia Preprocedure Evaluation (Addendum)
Anesthesia Evaluation  Patient identified by MRN, date of birth, ID band Patient awake    Reviewed: Allergy & Precautions, NPO status , Patient's Chart, lab work & pertinent test results, reviewed documented beta blocker date and time   Airway Mallampati: IV  TM Distance: >3 FB Neck ROM: Limited    Dental no notable dental hx. (+) Teeth Intact, Caps, Dental Advisory Given   Pulmonary sleep apnea and Continuous Positive Airway Pressure Ventilation , pneumonia, resolved   Pulmonary exam normal breath sounds clear to auscultation       Cardiovascular hypertension, Pt. on medications and Pt. on home beta blockers + CAD  Normal cardiovascular exam+ dysrhythmias Atrial Fibrillation  Rhythm:Regular Rate:Normal  Hx/o Ischemic CM LVEF 40-45% Echo 12/20  CAD Dx1- 60% at cath 03/22/2018 Last dose of Carvedilol 12/5  Echo 02/16/22 1. Extremely poor acoustic windows limit study, even with Definity use.  Difficult to assess LVEF or to compare to previous echoes. . Left  ventricular ejection fraction, by estimation, is 50 to 55%. The left  ventricle has low normal function. The left  ventricular internal cavity size was severely dilated. Left ventricular  diastolic parameters were normal.   2. Right ventricular systolic function is low normal. The right  ventricular size is normal.   3. No evidence of mitral valve regurgitation. No evidence of mitral  stenosis.   4. The aortic valve was not well visualized. Aortic valve regurgitation  is not visualized. No aortic stenosis is present.   EKG 08/03/22 NSR, Normal    Neuro/Psych  PSYCHIATRIC DISORDERS Anxiety Depression    negative neurological ROS     GI/Hepatic Neg liver ROS,GERD  Medicated,,  Endo/Other  diabetes, Well Controlled, Type 2, Oral Hypoglycemic Agents  GLP-1 RA therapy- last dose 11/28 Obesity HLD Male hypogonadism  Renal/GU negative Renal ROS   BPH     Musculoskeletal  (+) Arthritis , Osteoarthritis,    Abdominal  (+) + obese  Peds  Hematology  (+) Blood dyscrasia, anemia Xarelto therapy- last dose 12/5   Anesthesia Other Findings   Reproductive/Obstetrics                             Anesthesia Physical Anesthesia Plan  ASA: 3  Anesthesia Plan: General   Post-op Pain Management: Minimal or no pain anticipated   Induction: Intravenous  PONV Risk Score and Plan: 3 and Treatment may vary due to age or medical condition, Midazolam, Ondansetron and Dexamethasone  Airway Management Planned: Oral ETT  Additional Equipment: None  Intra-op Plan:   Post-operative Plan: Extubation in OR  Informed Consent: I have reviewed the patients History and Physical, chart, labs and discussed the procedure including the risks, benefits and alternatives for the proposed anesthesia with the patient or authorized representative who has indicated his/her understanding and acceptance.     Dental advisory given  Plan Discussed with: CRNA and Anesthesiologist  Anesthesia Plan Comments:         Anesthesia Quick Evaluation

## 2022-12-08 NOTE — Transfer of Care (Signed)
Immediate Anesthesia Transfer of Care Note  Patient: Ethan Pitts  Procedure(s) Performed: ATRIAL FIBRILLATION ABLATION LEFT ATRIAL APPENDAGE OCCLUSION TRANSESOPHAGEAL ECHOCARDIOGRAM  Patient Location: PACU  Anesthesia Type:General  Level of Consciousness: awake, alert , and oriented  Airway & Oxygen Therapy: Patient Spontanous Breathing and Patient connected to face mask oxygen  Post-op Assessment: Report given to RN and Post -op Vital signs reviewed and stable  Post vital signs: Reviewed and stable  Last Vitals:  Vitals Value Taken Time  BP    Temp    Pulse    Resp    SpO2      Last Pain:  Vitals:   12/08/22 0847  TempSrc: Oral  PainSc: 0-No pain      Patients Stated Pain Goal: 4 (12/08/22 0847)  Complications: There were no known notable events for this encounter.

## 2022-12-08 NOTE — Discharge Summary (Signed)
[facility]    Patient ID: Ethan Pitts,  MRN: [id number], DOB/AGE: 61-Mar-1962 61 y.o.  Admit date: 12/08/2022 Discharge date: 12/09/2022  Primary Care Physician: April Manson, NP  Primary Cardiologist: Olga Millers, MD  Electrophysiologist: Lewayne Bunting, MD  Primary Discharge Diagnosis:  Paroxysmal Atrial Fibrillation Poor candidacy for long term anticoagulation due to high risk occupation  Procedures This Admission:  Transeptal Puncture Intra-procedural TEE which showed no LAA thrombus Left atrial appendage occlusive device placement on 12/08/2022 by Dr. Lalla Brothers.   EP study 12/08/22 CONCLUSIONS: 1. Successful PVI 2. Successful ablation/isolation of the posterior wall 3. Successful Watchman device implant (left atrial appendage occlusion) 4. Intracardiac echo reveals trivial pericardial effusion, normal left atrial architecture 5. Transesophageal echo reveals trivial pericardial effusion, no left atrial appendage thrombus, successful left atrial appendage closure 6. No early apparent complications. 7. Colchicine 0.6mg  PO BID x 5 days 8. Protonix 40mg  PO daily x 45 days  TEE 12/08/22  1. Asked to image patient after afib ablation completed to aid in  placment of 27 mm Watchman FLX device. Large Chicken wing appendage with  no thrombus. Well placed device with no leak by color flow, negative tugg  test and no large shoulders Average  compression 28%.   2. Post tran septal puncture PFO persists only left to right shunt  through puncture site.   3. Left ventricular ejection fraction, by estimation, is 55 to 60%. The  left ventricle has normal function.   4. Right ventricular systolic function is normal. The right ventricular  size is normal.   5. No left atrial/left atrial appendage thrombus was detected.   6. The mitral valve is normal in structure. Mild mitral valve  regurgitation.   7. The aortic valve is tricuspid. Aortic valve regurgitation  is trivial.   8. Aortic dilatation noted. There is mild dilatation of the aortic root,  measuring 38 mm.   9. Small tunneled PFO present with mobile atrial septum.      Brief HPI: Ethan Pitts is a 62 y.o. male with a history of HTN, NICM, DMII, obesity, osteoarthritis, OSA on CPAP, aflutter s/p ablation, and PAF on Tikosyn with breakthrough episodes who was referred to Dr. Lalla Brothers for consideration of concomitant AF ablation and LAAO with Watchman implantation.   Pre-procedure CT with anatomy suitable to proceed with large chicken wing morphology LA with no thrombus measuring 18.58mm L with landing zone at 21.57mm x 18.81mm. Optimal punture at mid/mid with deployment angle RAO 4 CAU 2. Recommendations for a 27mm Watchman FLX Pro device with 20% compression.   Hospital Course:  The patient was admitted and underwent concomitant atrial fibrillation ablation and left atrial appendage occlusive device placement with 27mm Watchman FLX Pro on 12/08/2022. He was monitored in the post procedure setting and did very well. He was kept overnight for close observation with plan for next day discharge. Groin site has been stable without evidence of hematoma or bleeding. Wound care and restrictions were reviewed with the patient. The patient has been scheduled for post procedure follow up with Georgie Chard, NP in approximately 1 month. He was restarted on Xarelto the evening of his procedure and will continue for 45 days then stop (01/22/2023). At that time he will transition to Plavix 75mg  daily to complete 6 months of therapy (06/08/2023). He will require dental SBE during this time and should refrain from dental work or cleanings for at least 30 days post implant. SBE to be  RXd at follow up. A repeat CT will be performed in approximately 60 days to ensure proper seal of the device.   Patient was seen and examined by Dr. Jimmey Ralph on 12/7 and deemed stable for discharge.   Physical Exam: Vitals:   12/08/22  1832 12/08/22 1956 12/08/22 2227 12/09/22 0547  BP: 104/68 108/66 106/66 115/74  Pulse: 64  73 65  Resp: 20 20 20 18   Temp: 98.8 F (37.1 C) 98.7 F (37.1 C) 98 F (36.7 C) 98.2 F (36.8 C)  TempSrc: Oral Oral Oral Oral  SpO2: 92%  94% 94%  Weight:      Height:       See progress note from 12/7 for physical exam   Labs:   Lab Results  Component Value Date   WBC 3.8 (L) 12/08/2022   HGB 15.5 12/08/2022   HCT 43.8 12/08/2022   MCV 91.3 12/08/2022   PLT 149 (L) 12/08/2022    Recent Labs  Lab 12/08/22 0856  NA 134*  K 4.1  CL 98  CO2 24  BUN 11  CREATININE 0.98  CALCIUM 9.4  GLUCOSE 138*    Discharge Medications:  Allergies as of 12/09/2022   No Known Allergies      Medication List     TAKE these medications    ALPRAZolam 0.5 MG tablet Commonly known as: XANAX Take 1 tablet (0.5 mg total) by mouth at bedtime as needed for anxiety or sleep.   amLODipine 5 MG tablet Commonly known as: NORVASC TAKE 1 TABLET BY MOUTH ONCE DAILY . APPOINTMENT REQUIRED FOR FUTURE REFILLS   atorvastatin 20 MG tablet Commonly known as: LIPITOR Take 20 mg by mouth every evening.   carvedilol 25 MG tablet Commonly known as: COREG Take 25 mg by mouth 2 (two) times daily.   colchicine 0.6 MG tablet Take 1 tablet (0.6 mg total) by mouth 2 (two) times daily for 5 days.   dofetilide 500 MCG capsule Commonly known as: TIKOSYN Take 1 capsule by mouth twice daily   furosemide 40 MG tablet Commonly known as: LASIX Take 40 mg by mouth daily as needed for fluid.   Insulin Pen Needle 31G X 5 MM Misc Use with Victoza as directed   Jardiance 25 MG Tabs tablet Generic drug: empagliflozin Take 25 mg by mouth in the morning.   Magnesium Oxide 400 MG Caps Take 1 capsule (400 mg total) by mouth 2 (two) times daily.   metFORMIN 1000 MG tablet Commonly known as: GLUCOPHAGE TAKE 1 TABLET BY MOUTH TWICE DAILY WITH A MEAL   Mounjaro 15 MG/0.5ML Pen Generic drug:  tirzepatide Inject 15 mg into the skin every Thursday.   pantoprazole 40 MG tablet Commonly known as: Protonix Take 1 tablet (40 mg total) by mouth daily.   pregabalin 150 MG capsule Commonly known as: LYRICA Take 1 capsule by mouth twice daily   spironolactone 25 MG tablet Commonly known as: ALDACTONE Take 1 tablet by mouth once daily   tamsulosin 0.4 MG Caps capsule Commonly known as: FLOMAX Take 0.4 mg by mouth at bedtime.   testosterone cypionate 200 MG/ML injection Commonly known as: DEPOTESTOSTERONE CYPIONATE 1 ml IM q 14 days   Xarelto 20 MG Tabs tablet Generic drug: rivaroxaban TAKE 1 TABLET BY MOUTH ONCE DAILY WITH SUPPER What changed: when to take this   zaleplon 5 MG capsule Commonly known as: SONATA TAKE 1 CAPSULE BY MOUTH AT BEDTIME AS NEEDED FOR SLEEP  Disposition:  Home  Discharge Instructions     Call MD for:  extreme fatigue   Complete by: As directed    Call MD for:  persistant nausea and vomiting   Complete by: As directed    Call MD for:  redness, tenderness, or signs of infection (pain, swelling, redness, odor or green/yellow discharge around incision site)   Complete by: As directed    Call MD for:  temperature >100.4   Complete by: As directed    Diet - low sodium heart healthy   Complete by: As directed    Increase activity slowly   Complete by: As directed        Follow-up Information     [facility] Follow up on 01/10/2023.   Specialty: Cardiology Why: Your appointment has been scheduled for 01/10/2023 at 9AM. Please plan to arrive by 8:45AM. Contact information: [address] [place] [address] [phone]               Duration of Discharge Encounter: Greater than 30 minutes including physician time.  Signed, Jonita Albee, PA-C  12/09/2022 10:15 AM

## 2022-12-09 ENCOUNTER — Other Ambulatory Visit: Payer: Self-pay | Admitting: Cardiology

## 2022-12-09 DIAGNOSIS — Z95818 Presence of other cardiac implants and grafts: Secondary | ICD-10-CM

## 2022-12-09 MED ORDER — PANTOPRAZOLE SODIUM 40 MG PO TBEC: 40.0000 mg | DELAYED_RELEASE_TABLET | Freq: Every day | ORAL | 0 refills | Status: DC

## 2022-12-09 MED ORDER — COLCHICINE 0.6 MG PO TABS: 0.6000 mg | ORAL_TABLET | Freq: Two times a day (BID) | ORAL | 0 refills | Status: DC

## 2022-12-09 NOTE — Progress Notes (Signed)
   Patient Name: Ethan Pitts Date of Encounter: 12/09/2022 [facility] Cardiologist: Olga Millers, MD   Interval Summary  .    Concomitant Watchman and PVI yesterday. Did well overnight. Ambulating. Groin sites nontender, without bleeding. Urinating. Tolerating PO. No new or acute complaints.  Vital Signs .    Vitals:   12/08/22 1832 12/08/22 1956 12/08/22 2227 12/09/22 0547  BP: 104/68 108/66 106/66 115/74  Pulse: 64  73 65  Resp: 20 20 20 18   Temp: 98.8 F (37.1 C) 98.7 F (37.1 C) 98 F (36.7 C) 98.2 F (36.8 C)  TempSrc: Oral Oral Oral Oral  SpO2: 92%  94% 94%  Weight:      Height:        Intake/Output Summary (Last 24 hours) at 12/09/2022 0950 Last data filed at 12/09/2022 0100 Gross per 24 hour  Intake 1680 ml  Output 1080 ml  Net 600 ml      12/08/2022    8:47 AM 08/03/2022    2:38 PM 05/19/2022    8:56 AM  Last 3 Weights  Weight (lbs) 258 lb 264 lb 272 lb 12.8 oz  Weight (kg) 117.028 kg 119.75 kg 123.741 kg      Telemetry/ECG    Sinus rhythm - Personally Reviewed  Physical Exam .   GEN: No acute distress.   Neck: No JVD Cardiac: Normal rate, regular rhythm. Respiratory: Clear to auscultation bilaterally. GI: Soft, nontender, non-distended  Groins: Soft without hematoma or tenderness bilaterally MS: No edema  Assessment & Plan .     Mr. Ethan Pitts is a 62 year old male with a history of persistent atrial fibrillation and high occupational bleeding risk who underwent concomitant PVI and Watchman implant on 12/6. No complications.   #Persistent atrial fibrillation #Secondary hypercoagulable state due to atrial fibrillation c/b high occupational bleeding risk - Continue Eliquis.  - Continue Tikosyn. - Colchicine 0.6mg  BID x 5 days.  - Protonix 40mg  every day x 45 days.   For questions or updates, please contact [facility] Please consult [email or web address] for contact info under        Signed, Nobie Putnam, MD

## 2022-12-11 ENCOUNTER — Telehealth: Payer: Self-pay

## 2022-12-11 ENCOUNTER — Encounter (HOSPITAL_COMMUNITY): Payer: Self-pay | Admitting: Cardiology

## 2022-12-11 DIAGNOSIS — I4819 Other persistent atrial fibrillation: Secondary | ICD-10-CM

## 2022-12-11 DIAGNOSIS — Z95818 Presence of other cardiac implants and grafts: Secondary | ICD-10-CM

## 2022-12-11 LAB — POCT ACTIVATED CLOTTING TIME
Activated Clotting Time: 320 s
Activated Clotting Time: 366 s

## 2022-12-11 MED ORDER — COLCHICINE 0.6 MG PO TABS: 0.3000 mg | ORAL_TABLET | Freq: Every day | ORAL | 0 refills | Status: DC

## 2022-12-11 MED FILL — Midazolam HCl Inj 5 MG/5ML (Base Equivalent): INTRAMUSCULAR | Qty: 3 | Status: AC

## 2022-12-11 MED FILL — Fentanyl Citrate Preservative Free (PF) Inj 100 MCG/2ML: INTRAMUSCULAR | Qty: 1 | Status: AC

## 2022-12-11 NOTE — Telephone Encounter (Signed)
  [facility]   Watchman Team  Contacted the patient regarding discharge from [facility] on 12/09/2022  The patient understands to follow up with Jorja Loa, PA on 01/10/2023  The patient understands discharge instructions? Yes  The patient understands medications and regimen? Yes   The patient reports groin site looks healthy with no signs/symptoms of bleeding or infection  The patient understands to call with any questions or concerns prior to scheduled visit.

## 2022-12-11 NOTE — Addendum Note (Signed)
Addended by: Gunnar Fusi A on: 12/11/2022 05:08 PM   Modules accepted: Orders

## 2022-12-12 LAB — TYPE AND SCREEN
ABO/RH(D): A POS
Antibody Screen: POSITIVE
Donor AG Type: NEGATIVE
Donor AG Type: NEGATIVE
PT AG Type: NEGATIVE
Unit division: 0
Unit division: 0

## 2022-12-12 LAB — BPAM RBC
Blood Product Expiration Date: 202412232359
Blood Product Expiration Date: 202412232359
Unit Type and Rh: 6200
Unit Type and Rh: 6200

## 2022-12-14 ENCOUNTER — Encounter: Payer: Self-pay | Admitting: Cardiology

## 2022-12-29 ENCOUNTER — Telehealth: Payer: Self-pay

## 2022-12-29 NOTE — Telephone Encounter (Signed)
The patient reported he has FMLA that needs to be completed.  Will route to Dr. Lovena Neighbours nurse for next steps. The patient requested that if he does not answer the phone, to leave a message.

## 2022-12-29 NOTE — Telephone Encounter (Signed)
Left message for patient advising him that FMLA papers can be faxed over or dropped off at the office. Advised to call back with any questions or concerns.

## 2023-01-02 ENCOUNTER — Telehealth: Payer: Self-pay | Admitting: Cardiology

## 2023-01-02 DIAGNOSIS — Z0279 Encounter for issue of other medical certificate: Secondary | ICD-10-CM

## 2023-01-02 NOTE — Telephone Encounter (Signed)
 Pt dropped off [employer] paperwork. Copies are in plastic abc box. Red folder will be in providers box by eod. Please fax when complete and call pt to let him know. I did advise of the 7 to 14 business days and he advised he hopes to not get fired.   Pt has paid for forms.

## 2023-01-08 NOTE — Telephone Encounter (Signed)
 [employer]MLA form faxed to Rafael Gonzalez and scanned into chart.  Billing and patient notified.

## 2023-01-08 NOTE — Telephone Encounter (Signed)
Forms completed and given to front office staff

## 2023-01-09 ENCOUNTER — Ambulatory Visit (HOSPITAL_COMMUNITY): Payer: Commercial Managed Care - PPO | Admitting: Physician Assistant

## 2023-01-10 ENCOUNTER — Encounter (HOSPITAL_COMMUNITY): Payer: Self-pay | Admitting: Physician Assistant

## 2023-01-10 ENCOUNTER — Telehealth: Payer: Self-pay | Admitting: Cardiology

## 2023-01-10 ENCOUNTER — Ambulatory Visit (HOSPITAL_COMMUNITY)
Admission: RE | Admit: 2023-01-10 | Discharge: 2023-01-10 | Disposition: A | Discharge status: Home / Self Care | Payer: Commercial Managed Care - PPO | Source: Ambulatory Visit | Attending: Physician Assistant | Admitting: Physician Assistant

## 2023-01-10 VITALS — BP 118/68 | HR 68 | Ht 73.0 in | Wt 266.4 lb

## 2023-01-10 DIAGNOSIS — Z7901 Long term (current) use of anticoagulants: Secondary | ICD-10-CM | POA: Insufficient documentation

## 2023-01-10 DIAGNOSIS — Z5181 Encounter for therapeutic drug level monitoring: Secondary | ICD-10-CM | POA: Diagnosis present

## 2023-01-10 DIAGNOSIS — Z7985 Long-term (current) use of injectable non-insulin antidiabetic drugs: Secondary | ICD-10-CM | POA: Diagnosis not present

## 2023-01-10 DIAGNOSIS — G4733 Obstructive sleep apnea (adult) (pediatric): Secondary | ICD-10-CM | POA: Insufficient documentation

## 2023-01-10 DIAGNOSIS — I483 Typical atrial flutter: Secondary | ICD-10-CM | POA: Insufficient documentation

## 2023-01-10 DIAGNOSIS — I480 Paroxysmal atrial fibrillation: Secondary | ICD-10-CM | POA: Diagnosis not present

## 2023-01-10 DIAGNOSIS — E119 Type 2 diabetes mellitus without complications: Secondary | ICD-10-CM | POA: Insufficient documentation

## 2023-01-10 DIAGNOSIS — E669 Obesity, unspecified: Secondary | ICD-10-CM | POA: Insufficient documentation

## 2023-01-10 DIAGNOSIS — Z7984 Long term (current) use of oral hypoglycemic drugs: Secondary | ICD-10-CM | POA: Diagnosis not present

## 2023-01-10 DIAGNOSIS — Z79899 Other long term (current) drug therapy: Secondary | ICD-10-CM | POA: Diagnosis not present

## 2023-01-10 DIAGNOSIS — I428 Other cardiomyopathies: Secondary | ICD-10-CM | POA: Diagnosis not present

## 2023-01-10 DIAGNOSIS — I10 Essential (primary) hypertension: Secondary | ICD-10-CM | POA: Insufficient documentation

## 2023-01-10 DIAGNOSIS — Z6835 Body mass index (BMI) 35.0-35.9, adult: Secondary | ICD-10-CM | POA: Diagnosis not present

## 2023-01-10 DIAGNOSIS — D6869 Other thrombophilia: Secondary | ICD-10-CM | POA: Diagnosis not present

## 2023-01-10 LAB — BASIC METABOLIC PANEL
Anion gap: 8 (ref 5–15)
BUN: 14 mg/dL (ref 8–23)
CO2: 27 mmol/L (ref 22–32)
Calcium: 9.5 mg/dL (ref 8.9–10.3)
Chloride: 104 mmol/L (ref 98–111)
Creatinine, Ser: 1.13 mg/dL (ref 0.61–1.24)
GFR, Estimated: >60 mL/min (ref >60)
Glucose, Bld: 130 mg/dL — ABNORMAL HIGH (ref 70–99)
Potassium: 4.3 mmol/L (ref 3.5–5.1)
Sodium: 139 mmol/L (ref 135–145)

## 2023-01-10 LAB — MAGNESIUM: Magnesium: 2.2 mg/dL (ref 1.7–2.4)

## 2023-01-10 MED ORDER — PANTOPRAZOLE SODIUM 40 MG PO TBEC: 40.0000 mg | DELAYED_RELEASE_TABLET | Freq: Every day | ORAL | 0 refills | Status: DC

## 2023-01-10 MED ORDER — AMOXICILLIN 500 MG PO CAPS: 2000.0000 mg | ORAL_CAPSULE | ORAL | 0 refills | Status: AC

## 2023-01-10 MED ORDER — CLOPIDOGREL BISULFATE 75 MG PO TABS: 75.0000 mg | ORAL_TABLET | Freq: Every day | ORAL | 2 refills | Status: DC

## 2023-01-10 NOTE — Progress Notes (Signed)
 Primary Care Physician: Teresa Aldona CROME, NP  Referring Physician: Jolynn Pack [facility] Primary Cardiologist: Dr Pietro Primary EP: Dr Waddell Lonni DELENA Ethan Pitts is a 63 y.o. male with a history of atrial flutter, OSA, HTN, DM who presents for follow up in the [facility].  The patient was initially diagnosed with atrial flutter 02/03/18 when his Apple Watch showed that he had an irregular rhythm. 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 He had no awareness of his arrhythmia. Patient is s/p atrial flutter ablation 07/09/18 with Dr Waddell. Zio patch 12/2018 showed 35% afib burden with rapid rates at times. He was started back on Xarelto  for a CHADS2VASC score of 4 by Dr Pietro. He was loaded on dofetilide  for rhythm control. He was seen by Dr Cindie and underwent concomitant afib ablation and Watchman implant on 12/08/22.  On follow up today, patient reports that he has done well since the procedure. He did have an ED visit on 01/06/23 for bradycardia. He inadvertently took an extra dose of all his medications including carvedilol  and dofetilide . He was monitored and his heart rate returned to normal. He denies significant chest pain or groin issues.   Today, he denies symptoms of palpitations, chest pain, SOB, orthopnea, PND, lower extremity edema, dizziness, presyncope, syncope, bleeding, or neurologic sequela. The patient is tolerating medications without difficulties and is otherwise without complaint today.    Atrial Fibrillation Risk Factors:  he does have symptoms or diagnosis of sleep apnea. he does not have a history of rheumatic fever. he does not have a history of significant alcohol use. The patient does not have a history of early familial atrial fibrillation or other arrhythmias.   Atrial Fibrillation Management history:  Previous antiarrhythmic drugs:  dofetilide   Previous cardioversions: none Previous ablations: 07/09/18 flutter, 12/08/22 Anticoagulation history: Xarelto    Past Medical History:  Diagnosis Date   Anxiety    Arthritis    lower back    Atrial flutter (HCC) 2020   Cards->lopressor  and anticoag, ablation. In sinus still as of 08/2018 cards f/u.  Recurrence + PAF->requiring pt to get back on xarelto    BPH with obstruction/lower urinary tract symptoms    started flomax  via urol 03/2019   CAD (coronary artery disease) 03/2018   Ostial first diagonal 60% narrowed, otherwise normal coronaries. Dr. Pietro added ASA and statin 08/2018.   Depression    Diabetes mellitus    False positive stress test 03/2018   Cath showed normal coronaries with EF 50%   GERD (gastroesophageal reflux disease)    Hyperlipidemia    Hypertension    Hypogonadism, male 05/2015   Increased prostate specific antigen (PSA) velocity 2019   06/2017 velocity up; recheck 10/2017 back to baseline.  Plan repeat 1 yr.   Low back pain 2016   Left lumbar radiculopathy, lumbar spondylolisthesis.  Sciatica episode 03/2016.  Ortho getting L spine MRI as of 07/2016.   Microcytic anemia 12/2015   suspect iron def due to malabsorption.  Hemoccults NEG 01/26/16, ferrous sulfate started.   Neuropathic pain of left foot    NICM (nonischemic cardiomyopathy) (HCC) 05/2018   EF 40-45%, +LV hypokinesis. Suspected to be tachycardia-mediated->Dr. Pietro to rpt echo fall 2020.   OSA on CPAP    cpap settings at 3   Peyronie's disease 2021   Dr. Donavon (collegenase intralesional injections)   Presence of  Watchman left atrial appendage closure device 12/08/2022   27mm Watchman FLX Pro placed by Dr. Cindie   S/P ablation of atrial fibrillation 12/08/2022    Current Outpatient Medications  Medication Sig Dispense Refill   ALPRAZolam  (XANAX ) 0.5 MG tablet Take 1 tablet (0.5 mg total) by mouth at bedtime as needed for anxiety or sleep. 30 tablet 0   amLODipine  (NORVASC )  5 MG tablet TAKE 1 TABLET BY MOUTH ONCE DAILY . APPOINTMENT REQUIRED FOR FUTURE REFILLS 90 tablet 3   atorvastatin  (LIPITOR) 20 MG tablet Take 20 mg by mouth every evening.     carvedilol  (COREG ) 25 MG tablet Take 25 mg by mouth 2 (two) times daily.     colchicine  0.6 MG tablet Take 0.5 tablets (0.3 mg total) by mouth daily. For 5 days 3 tablet 0   dofetilide  (TIKOSYN ) 500 MCG capsule Take 1 capsule by mouth twice daily 180 capsule 1   furosemide  (LASIX ) 40 MG tablet Take 40 mg by mouth daily as needed for fluid.     Insulin  Pen Needle 31G X 5 MM MISC Use with Victoza  as directed 100 each 11   JARDIANCE  25 MG TABS tablet Take 25 mg by mouth in the morning.     Magnesium  Oxide 400 MG CAPS Take 1 capsule (400 mg total) by mouth 2 (two) times daily. 180 capsule 2   metFORMIN  (GLUCOPHAGE ) 1000 MG tablet TAKE 1 TABLET BY MOUTH TWICE DAILY WITH A MEAL 180 tablet 3   MOUNJARO 15 MG/0.5ML Pen Inject 15 mg into the skin every Thursday.     pantoprazole  (PROTONIX ) 40 MG tablet Take 1 tablet (40 mg total) by mouth daily. 45 tablet 0   pregabalin  (LYRICA ) 150 MG capsule Take 1 capsule by mouth twice daily 60 capsule 5   spironolactone  (ALDACTONE ) 25 MG tablet Take 1 tablet by mouth once daily 90 tablet 3   tamsulosin  (FLOMAX ) 0.4 MG CAPS capsule Take 0.4 mg by mouth at bedtime.     testosterone  cypionate (DEPOTESTOSTERONE CYPIONATE) 200 MG/ML injection 1 ml IM q 14 days 10 mL 0   XARELTO  20 MG TABS tablet TAKE 1 TABLET BY MOUTH ONCE DAILY WITH SUPPER (Patient taking differently: Take 20 mg by mouth in the morning.) 90 tablet 2   zaleplon  (SONATA ) 5 MG capsule TAKE 1 CAPSULE BY MOUTH AT BEDTIME AS NEEDED FOR SLEEP 90 capsule 1   No current facility-administered medications for this encounter.    ROS- All systems are reviewed and negative except as per the HPI above.  Physical Exam: Vitals:   01/10/23 0844  BP: 118/68  Pulse: 68  Weight: 120.8 kg  Height: 6' 1 (1.854 m)     GEN: Well nourished,  well developed in no acute distress NECK: No JVD; No carotid bruits CARDIAC: Regular rate and rhythm, no murmurs, rubs, gallops RESPIRATORY:  Clear to auscultation without rales, wheezing or rhonchi  ABDOMEN: Soft, non-tender, non-distended EXTREMITIES:  No edema; No deformity    Wt Readings from Last 3 Encounters:  01/10/23 120.8 kg  12/08/22 117 kg  08/03/22 119.7 kg    EKG today demonstrates  SR Vent. rate 68 BPM PR interval 180 ms QRS duration 108 ms QT/QTcB 422/448 ms   Epic records are reviewed at length today   Echo 02/16/22  1. Extremely poor acoustic windows limit study, even with Definity  use.  Difficult to assess LVEF or to compare to previous echoes. . Left  ventricular ejection fraction, by estimation, is 50 to 55%.  The left  ventricle has low normal function. The left ventricular internal cavity size was severely dilated. Left ventricular diastolic parameters were normal.   2. Right ventricular systolic function is low normal. The right  ventricular size is normal.   3. No evidence of mitral valve regurgitation. No evidence of mitral  stenosis.   4. The aortic valve was not well visualized. Aortic valve regurgitation  is not visualized. No aortic stenosis is present.    CHA2DS2-VASc Score = 4  The patient's score is based upon: CHF History: 1 HTN History: 1 Diabetes History: 1 Stroke History: 0 Vascular Disease History: 1 Age Score: 0 Gender Score: 0       HAS-BLED score 1 Hypertension Yes  Abnormal renal and liver function (Dialysis, transplant, Cr >2.26 mg/dL /Cirrhosis or Bilirubin >2x Normal or AST/ALT/AP >3x Normal) No  Stroke No  Bleeding No  Labile INR (Unstable/high INR) No  Elderly (>65) No  Drugs or alcohol (>= 8 drinks/week, anti-plt or NSAID) No     ASSESSMENT AND PLAN: Paroxysmal Atrial Fibrillation/typical atrial flutter The patient's CHA2DS2-VASc score is 4, indicating a 4.8% annual risk of stroke.   S/p atrial flutter  ablation with Dr Waddell 07/09/18 S/p Tikosyn  load 06/2019 S/p concomitant afib ablation and Watchman implant 12/08/22.  Patient appears to be maintaining SR Continue dofetilide  500 mcg BID, QT stable Check bmet/mag today Continue Xarelto  20 mg daily for now.  Continue Coreg  25 mg BID Apple watch for monitoring  Secondary Hypercoagulable State (ICD10:  D68.69) The patient is at significant risk for stroke/thromboembolism based upon his CHA2DS2-VASc Score of 4.  Continue Rivaroxaban  (Xarelto ) for now. He is s/p Watchman implant. CT instructions given.   Obesity Body mass index is 35.15 kg/m.  Encouraged lifestyle modification He has lost a considerable amount of weight on Mounjaro.  OSA  Encouraged nightly CPAP Followed by Dr Burnard  HTN Stable on current regimen  NICM EF normalized to 60-65% Appears euvolemic today.   Follow up as scheduled with the structural heart team.   Ethan Kicks PA-C Afib Clinic [facility] [address] [place], [place] [address] [phone] 01/10/2023 8:58 AM

## 2023-01-10 NOTE — Addendum Note (Signed)
 Addended by: Georgie Chard D on: 01/10/2023 03:44 PM   Modules accepted: Orders

## 2023-01-10 NOTE — Telephone Encounter (Signed)
 Patient contacted for post LAAO medication changes. He will continue Xarelto  20mg  daily through 01/22/23 then stop. At that time he will start Plavix  75mg  PO daily to complete 6 months post implant therapy. He will additionally need dental SBE coverage with Amoxicillin  through 6 months. These changes were discussed today with understanding. He has follow up with our team 06/11/23. We will follow CT results.    Kate Minus NP-C Structural Heart Team  Phone: [phone]

## 2023-01-10 NOTE — Telephone Encounter (Signed)
 Patient seen earlier today for 1 month s/p LAAO/ablation. He was continued on Xarelto  however unclear if he was instructed to stop this on 01/22/23 and to transition to Plavix  75mg  daily to complete 6 months post implant therapy. He will additionally need dental SBE during that time. Attempted to call with no answer and no way to leave VM. Sent MyChart message as well. Will follow for response and attempt to call again.   Kate Minus NP-C Structural Heart Team  Phone: [phone]

## 2023-01-29 ENCOUNTER — Other Ambulatory Visit: Payer: Self-pay

## 2023-01-29 NOTE — Telephone Encounter (Signed)
[facility] pharmacy is requesting a refill on pantoprazole. Would Dr. Jens Som like to refill this medication? Please address

## 2023-01-29 NOTE — Telephone Encounter (Signed)
Pantoprazole was refilled on 01/10/23 by Noreene Larsson NP

## 2023-02-05 ENCOUNTER — Other Ambulatory Visit: Payer: Self-pay | Admitting: Cardiology

## 2023-02-13 ENCOUNTER — Ambulatory Visit (HOSPITAL_COMMUNITY): Admission: RE | Admit: 2023-02-13 | Payer: Commercial Managed Care - PPO | Source: Ambulatory Visit

## 2023-02-13 ENCOUNTER — Telehealth: Payer: Self-pay

## 2023-02-13 NOTE — Telephone Encounter (Signed)
The CT department spoke with the patient. He thought his appointment was tomorrow. They rescheduled him to 02/28/2023.

## 2023-02-13 NOTE — Telephone Encounter (Signed)
Called to check on patient after he missed his post-Watchman CT today.  Left message to call back. Will reschedule CT at that time.

## 2023-02-28 ENCOUNTER — Ambulatory Visit (HOSPITAL_COMMUNITY)
Admission: RE | Admit: 2023-02-28 | Discharge: 2023-02-28 | Disposition: A | Discharge status: Home / Self Care | Payer: Commercial Managed Care - PPO | Source: Ambulatory Visit | Attending: Cardiology | Admitting: Cardiology

## 2023-02-28 DIAGNOSIS — I4819 Other persistent atrial fibrillation: Secondary | ICD-10-CM | POA: Diagnosis not present

## 2023-02-28 DIAGNOSIS — Z95818 Presence of other cardiac implants and grafts: Secondary | ICD-10-CM | POA: Insufficient documentation

## 2023-02-28 MED ORDER — IOHEXOL 350 MG/ML SOLN
95.0000 mL | Freq: Once | INTRAVENOUS | Status: AC | PRN
  Administered 2023-02-28: 95 mL via INTRAVENOUS

## 2023-03-06 ENCOUNTER — Encounter: Payer: Self-pay | Admitting: Cardiology

## 2023-03-08 ENCOUNTER — Other Ambulatory Visit: Payer: Self-pay | Admitting: Cardiovascular Disease

## 2023-03-16 NOTE — Addendum Note (Signed)
 Addended by: Gunnar Fusi A on: 03/16/2023 09:13 AM   Modules accepted: Orders

## 2023-06-11 ENCOUNTER — Ambulatory Visit: Admitting: Student

## 2023-06-11 ENCOUNTER — Ambulatory Visit: Payer: Commercial Managed Care - PPO | Admitting: Physician Assistant

## 2023-06-14 ENCOUNTER — Ambulatory Visit: Admitting: Student

## 2023-06-26 NOTE — Progress Notes (Deleted)
  Electrophysiology Office Note:   Date:  06/26/2023  ID:  Ethan Pitts, DOB [birth year], MRN [id number]  Primary Cardiologist: Redell Shallow, MD Electrophysiologist: Danelle Birmingham, MD  {Click to update primary MD,subspecialty MD or APP then REFRESH:1}    History of Present Illness:   Ethan Pitts is a 63 y.o. male with h/o AF/AFL s/p multiple ablations and Watchman, OSA, HTN, and DM2 seen today for routine electrophysiology followup.   Since last being seen in our clinic the patient reports doing ***.  he denies chest pain, palpitations, dyspnea, PND, orthopnea, nausea, vomiting, dizziness, syncope, edema, weight gain, or early satiety.   Review of systems complete and found to be negative unless listed in HPI.   EP Information / Studies Reviewed:    EKG is ordered today. Personal review as below.       Arrhythmia/Device History No specialty comments available.   Physical Exam:   VS:  There were no vitals taken for this visit.   Wt Readings from Last 3 Encounters:  01/10/23 266 lb 6.4 oz (120.8 kg)  12/08/22 258 lb (117 kg)  08/03/22 264 lb (119.7 kg)     GEN: No acute distress NECK: No JVD; No carotid bruits CARDIAC: {EPRHYTHM:28826}, no murmurs, rubs, gallops RESPIRATORY:  Clear to auscultation without rales, wheezing or rhonchi  ABDOMEN: Soft, non-tender, non-distended EXTREMITIES:  {EDEMA LEVEL:28147::No} edema; No deformity   ASSESSMENT AND PLAN:    Paroxysmal AF Typical AFL S/p AFL ablation 07/2018 S/p concomitant ablation and Watchman 12/08/2022 EKG today shows *** Continue tikosyn  500 mcg BID Continue Xarelto  20 mg daily for CHA2DS2VASc of at least 4  Secondary hypercoagulable state Pt on Xarelto  as above   Obesity There is no height or weight on file to calculate BMI.  Encouraged lifestyle modification   OSA  Encouraged nightly CPAP Followed by Dr. Burnard  HTN Stable on current regimen   NICM EF normalized to 60-65%  {Click here  to Review PMH, Prob List, Meds, Allergies, SHx, FHx  :1}   Follow up with {EPMDS:28135::EP Team} {EPFOLLOW LE:71826}  Signed, Ozell Prentice Passey, PA-C

## 2023-06-28 ENCOUNTER — Ambulatory Visit: Attending: Student | Admitting: Student

## 2023-06-28 DIAGNOSIS — I10 Essential (primary) hypertension: Secondary | ICD-10-CM

## 2023-06-28 DIAGNOSIS — Z95818 Presence of other cardiac implants and grafts: Secondary | ICD-10-CM

## 2023-06-28 DIAGNOSIS — Z79899 Other long term (current) drug therapy: Secondary | ICD-10-CM

## 2023-06-28 DIAGNOSIS — I428 Other cardiomyopathies: Secondary | ICD-10-CM

## 2023-06-28 DIAGNOSIS — I480 Paroxysmal atrial fibrillation: Secondary | ICD-10-CM

## 2023-06-28 DIAGNOSIS — Z01818 Encounter for other preprocedural examination: Secondary | ICD-10-CM

## 2023-06-29 ENCOUNTER — Encounter: Payer: Self-pay | Admitting: Student

## 2023-07-25 ENCOUNTER — Telehealth: Payer: Self-pay

## 2023-07-25 NOTE — Telephone Encounter (Signed)
 Called to check on the patient as he missed his 6 month Watchman visit.  Left message to call back. If he is doing OK, will STOP PLAVIX  and START ASA 81 mg daily. Will also arrange overdue follow-up with Dr. Waddell.

## 2023-07-26 MED ORDER — ASPIRIN 81 MG PO TBEC
81.0000 mg | DELAYED_RELEASE_TABLET | Freq: Every day | ORAL | Status: AC
Start: 2023-07-26 — End: ?

## 2023-07-26 NOTE — Telephone Encounter (Signed)
 Instructed the patient to STOP PLAVIX  and START ASA 81 mg daily. He also asked if he could stop pantoprazole  as he has not had any GERD symptoms. Will remove from his medication list and he understands to contact his doctor if he needs to restart. He is currently out with his son. He will call the office to arrange follow-up with Dr. Adrian team.

## 2023-07-26 NOTE — Addendum Note (Signed)
 Addended by: Debar Plate A on: 07/26/2023 04:55 PM   Modules accepted: Orders

## 2023-07-26 NOTE — Telephone Encounter (Signed)
 Left message to call back

## 2023-07-27 ENCOUNTER — Other Ambulatory Visit (HOSPITAL_COMMUNITY): Payer: Self-pay | Admitting: Physician Assistant

## 2023-08-23 ENCOUNTER — Other Ambulatory Visit (HOSPITAL_COMMUNITY): Payer: Self-pay

## 2023-08-23 ENCOUNTER — Other Ambulatory Visit (HOSPITAL_COMMUNITY): Payer: Self-pay | Admitting: *Deleted

## 2023-08-23 MED ORDER — DOFETILIDE 500 MCG PO CAPS: 500.0000 ug | ORAL_CAPSULE | Freq: Two times a day (BID) | ORAL | 0 refills | Status: DC

## 2023-09-06 ENCOUNTER — Ambulatory Visit (HOSPITAL_COMMUNITY)
Admission: RE | Admit: 2023-09-06 | Discharge: 2023-09-06 | Disposition: A | Discharge status: Home / Self Care | Source: Ambulatory Visit | Attending: Physician Assistant | Admitting: Physician Assistant

## 2023-09-06 VITALS — BP 130/78 | HR 65 | Ht 73.0 in | Wt 275.0 lb

## 2023-09-06 DIAGNOSIS — I480 Paroxysmal atrial fibrillation: Secondary | ICD-10-CM | POA: Diagnosis not present

## 2023-09-06 DIAGNOSIS — I4891 Unspecified atrial fibrillation: Secondary | ICD-10-CM

## 2023-09-06 DIAGNOSIS — D6869 Other thrombophilia: Secondary | ICD-10-CM

## 2023-09-06 NOTE — Progress Notes (Signed)
 Primary Care Physician: Teresa Aldona CROME, NP  Referring Physician: Jolynn Pack ER Primary Cardiologist: Dr Pietro Primary EP: Dr Waddell Lonni DELENA Ethan Pitts is a 63 y.o. male with a history of atrial flutter, OSA, HTN, DM who presents for follow up in the [facility].  The patient was initially diagnosed with atrial flutter 02/03/18 when his Apple Watch showed that he had an irregular rhythm. 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 He had no awareness of his arrhythmia. Patient is s/p atrial flutter ablation 07/09/18 with Dr Waddell. Zio patch 12/2018 showed 35% afib burden with rapid rates at times. He was started back on Xarelto  for a CHADS2VASC score of 4 by Dr Pietro. He was loaded on dofetilide  for rhythm control. He was seen by Dr Cindie and underwent concomitant afib ablation and Watchman implant on 12/08/22.  Patient returns for follow up for atrial fibrillation and dofetilide  monitoring. He reports that he has done very well since his last visit. He has not had any interim symptoms of afib. He is now off anticoagulation.   Today, he  denies symptoms of palpitations, chest pain, shortness of breath, orthopnea, PND, lower extremity edema, dizziness, presyncope, syncope, bleeding, or neurologic sequela. The patient is tolerating medications without difficulties and is otherwise without complaint today.    Atrial Fibrillation Risk Factors:  he does have symptoms or diagnosis of sleep apnea. he does not have a history of rheumatic fever. he does not have a history of significant alcohol use. The patient does not have a history of early familial atrial fibrillation or other arrhythmias.   Atrial Fibrillation Management history:  Previous antiarrhythmic drugs: dofetilide   Previous cardioversions: none Previous ablations: 07/09/18 flutter, 12/08/22 Anticoagulation  history: Xarelto    Past Medical History:  Diagnosis Date   Anxiety    Arthritis    lower back    Atrial flutter (HCC) 2020   Cards->lopressor  and anticoag, ablation. In sinus still as of 08/2018 cards f/u.  Recurrence + PAF->requiring pt to get back on xarelto    BPH with obstruction/lower urinary tract symptoms    started flomax  via urol 03/2019   CAD (coronary artery disease) 03/2018   Ostial first diagonal 60% narrowed, otherwise normal coronaries. Dr. Pietro added ASA and statin 08/2018.   Depression    Diabetes mellitus    False positive stress test 03/2018   Cath showed normal coronaries with EF 50%   GERD (gastroesophageal reflux disease)    Hyperlipidemia    Hypertension    Hypogonadism, male 05/2015   Increased prostate specific antigen (PSA) velocity 2019   06/2017 velocity up; recheck 10/2017 back to baseline.  Plan repeat 1 yr.   Low back pain 2016   Left lumbar radiculopathy, lumbar spondylolisthesis.  Sciatica episode 03/2016.  Ortho getting L spine MRI as of 07/2016.   Microcytic anemia 12/2015   suspect iron def due to malabsorption.  Hemoccults NEG 01/26/16, ferrous sulfate started.   Neuropathic pain of left foot    NICM (nonischemic cardiomyopathy) (HCC) 05/2018   EF 40-45%, +LV hypokinesis. Suspected to be tachycardia-mediated->Dr. Pietro to rpt echo fall 2020.   OSA on CPAP    cpap settings at 3   Peyronie's disease 2021   Dr. 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  Cindie   S/P ablation of atrial fibrillation 12/08/2022    Current Outpatient Medications  Medication Sig Dispense Refill   ALPRAZolam  (XANAX ) 0.5 MG tablet Take 1 tablet (0.5 mg total) by mouth at bedtime as needed for anxiety or sleep. 30 tablet 0   amLODipine  (NORVASC ) 5 MG tablet Take 1 tablet (5 mg total) by mouth daily. 90 tablet 1   amoxicillin  (AMOXIL ) 500 MG  capsule Take 4 capsules (2,000 mg total) by mouth as directed. Take 4 tablets 1 hour prior to dental work, including cleanings. 12 capsule 0   aspirin  EC 81 MG tablet Take 1 tablet (81 mg total) by mouth daily. Swallow whole.     atorvastatin  (LIPITOR) 20 MG tablet Take 20 mg by mouth every evening.     carvedilol  (COREG ) 25 MG tablet Take 25 mg by mouth 2 (two) times daily.     dofetilide  (TIKOSYN ) 500 MCG capsule Take 1 capsule (500 mcg total) by mouth 2 (two) times daily. Appointment Required For Further Refills [id number] 60 capsule 0   furosemide  (LASIX ) 40 MG tablet Take 40 mg by mouth daily as needed for fluid. (Patient taking differently: Take 40 mg by mouth as needed for fluid.)     Insulin  Pen Needle 31G X 5 MM MISC Use with Victoza  as directed 100 each 11   JARDIANCE  25 MG TABS tablet Take 25 mg by mouth in the morning.     Magnesium  Oxide 400 MG CAPS Take 1 capsule (400 mg total) by mouth 2 (two) times daily. 180 capsule 2   metFORMIN  (GLUCOPHAGE ) 1000 MG tablet TAKE 1 TABLET BY MOUTH TWICE DAILY WITH A MEAL (Patient taking differently: Take 1,000 mg by mouth daily with breakfast.) 180 tablet 3   MOUNJARO 15 MG/0.5ML Pen Inject 15 mg into the skin every Thursday.     pregabalin  (LYRICA ) 150 MG capsule Take 1 capsule by mouth twice daily (Patient taking differently: Take 150 mg by mouth 3 (three) times daily.) 60 capsule 5   spironolactone  (ALDACTONE ) 25 MG tablet Take 1 tablet by mouth once daily 90 tablet 3   tamsulosin  (FLOMAX ) 0.4 MG CAPS capsule Take 0.4 mg by mouth at bedtime.     zaleplon  (SONATA ) 5 MG capsule TAKE 1 CAPSULE BY MOUTH AT BEDTIME AS NEEDED FOR SLEEP 90 capsule 1   testosterone  cypionate (DEPOTESTOSTERONE CYPIONATE) 200 MG/ML injection 1 ml IM q 14 days (Patient not taking: Reported on 09/06/2023) 10 mL 0   No current facility-administered medications for this encounter.    ROS- All systems are reviewed and negative except as per the HPI above.  Physical  Exam: Vitals:   09/06/23 0832  BP: 130/78  Pulse: 65  Weight: 124.7 kg  Height: 6' 1 (1.854 m)    GEN: Well nourished, well developed in no acute distress CARDIAC: Regular rate and rhythm, no murmurs, rubs, gallops RESPIRATORY:  Clear to auscultation without rales, wheezing or rhonchi  ABDOMEN: Soft, non-tender, non-distended EXTREMITIES:  No edema; No deformity    Wt Readings from Last 3 Encounters:  09/06/23 124.7 kg  01/10/23 120.8 kg  12/08/22 117 kg    EKG today demonstrates  SR Vent. rate 65 BPM PR interval 192 ms QRS duration 112 ms QT/QTcB 428/445 ms   Epic records are reviewed at length today   Echo 02/16/22  1. Extremely poor acoustic windows limit study, even with Definity  use.  Difficult to assess LVEF or to compare to previous echoes. . Left  ventricular ejection fraction,  by estimation, is 50 to 55%. The left  ventricle has low normal function. The left ventricular internal cavity size was severely dilated. Left ventricular diastolic parameters were normal.   2. Right ventricular systolic function is low normal. The right  ventricular size is normal.   3. No evidence of mitral valve regurgitation. No evidence of mitral  stenosis.   4. The aortic valve was not well visualized. Aortic valve regurgitation  is not visualized. No aortic stenosis is present.    CHA2DS2-VASc Score = 4  The patient's score is based upon: CHF History: 1 HTN History: 1 Diabetes History: 1 Stroke History: 0 Vascular Disease History: 1 Age Score: 0 Gender Score: 0       ASSESSMENT AND PLAN: Paroxysmal Atrial Fibrillation/typical atrial flutter (ICD10:  I48.0) The patient's CHA2DS2-VASc score is 4, indicating a 4.8% annual risk of stroke.   S/p flutter ablation 07/09/18, afib ablation (with Watchman) 12/08/22 S/p dofetilide  loading 06/2019 Patient appears to be maintaining SR. We discussed rhythm control today. Given his recent significant weight loss and recent ablation,  we have decided to trial off dofetilide . He understands that if he has recurrence of afib, he will need to be readmitted to resume.  Continue carvedilol  25 mg BID Apple Watch for home monitoring.   Secondary Hypercoagulable State (ICD10:  D68.69) The patient is at significant risk for stroke/thromboembolism based upon his CHA2DS2-VASc Score of 4.  S/p Watchman implant 12/08/22.    Obesity Body mass index is 36.28 kg/m.  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 AF clinic in one year.    Daril Kicks PA-C Afib Clinic [facility] [address] [place], [place] [address] [phone] 09/06/2023 8:49 AM

## 2023-09-06 NOTE — Addendum Note (Signed)
 Encounter addended by: Franchot Glade RAMAN, RN on: 09/06/2023 10:29 AM  Actions taken: Order list changed

## 2023-09-22 ENCOUNTER — Other Ambulatory Visit: Payer: Self-pay | Admitting: Cardiology

## 2023-10-20 ENCOUNTER — Other Ambulatory Visit: Payer: Self-pay | Admitting: Internal Medicine

## 2023-11-23 NOTE — Progress Notes (Deleted)
 HPI: Follow-up atrial fibrillation/flutter and cardiomyopathy.  Cath 3/20 showed EF 50 60 D1 and otherwise normal coronaries. Had atrial flutter ablation 7/20.  Echocardiogram February 2024 showed normal LV function, severe left ventricular enlargement.  CTA PV November 2024 showed calcium  score 758 which was 95th percentile.  Had PVI and watchman placed December 2024.  Follow-up CTA showed well-positioned device, mild biatrial enlargement, small residual iatrogenic ASD.  Since last seen,    Current Outpatient Medications  Medication Sig Dispense Refill   ALPRAZolam  (XANAX ) 0.5 MG tablet Take 1 tablet (0.5 mg total) by mouth at bedtime as needed for anxiety or sleep. 30 tablet 0   amLODipine  (NORVASC ) 5 MG tablet Take 1 tablet by mouth once daily 90 tablet 0   amoxicillin  (AMOXIL ) 500 MG capsule Take 4 capsules (2,000 mg total) by mouth as directed. Take 4 tablets 1 hour prior to dental work, including cleanings. 12 capsule 0   aspirin  EC 81 MG tablet Take 1 tablet (81 mg total) by mouth daily. Swallow whole.     atorvastatin  (LIPITOR) 20 MG tablet Take 20 mg by mouth every evening.     carvedilol  (COREG ) 25 MG tablet Take 25 mg by mouth 2 (two) times daily.     furosemide  (LASIX ) 40 MG tablet Take 40 mg by mouth daily as needed for fluid. (Patient taking differently: Take 40 mg by mouth as needed for fluid.)     Insulin  Pen Needle 31G X 5 MM MISC Use with Victoza  as directed 100 each 11   JARDIANCE  25 MG TABS tablet Take 25 mg by mouth in the morning.     Magnesium  Oxide 400 MG CAPS Take 1 capsule (400 mg total) by mouth 2 (two) times daily. 180 capsule 2   metFORMIN  (GLUCOPHAGE ) 1000 MG tablet TAKE 1 TABLET BY MOUTH TWICE DAILY WITH A MEAL (Patient taking differently: Take 1,000 mg by mouth daily with breakfast.) 180 tablet 3   MOUNJARO 15 MG/0.5ML Pen Inject 15 mg into the skin every Thursday.     pregabalin  (LYRICA ) 150 MG capsule Take 1 capsule by mouth twice daily (Patient taking  differently: Take 150 mg by mouth 3 (three) times daily.) 60 capsule 5   spironolactone  (ALDACTONE ) 25 MG tablet Take 1 tablet by mouth once daily 90 tablet 3   tamsulosin  (FLOMAX ) 0.4 MG CAPS capsule Take 0.4 mg by mouth at bedtime.     testosterone  cypionate (DEPOTESTOSTERONE CYPIONATE) 200 MG/ML injection 1 ml IM q 14 days (Patient not taking: Reported on 09/06/2023) 10 mL 0   zaleplon  (SONATA ) 5 MG capsule TAKE 1 CAPSULE BY MOUTH AT BEDTIME AS NEEDED FOR SLEEP 90 capsule 1   No current facility-administered medications for this visit.     Past Medical History:  Diagnosis Date   Anxiety    Arthritis    lower back    Atrial flutter (HCC) 2020   Cards->lopressor  and anticoag, ablation. In sinus still as of 08/2018 cards f/u.  Recurrence + PAF->requiring pt to get back on xarelto    BPH with obstruction/lower urinary tract symptoms    started flomax  via urol 03/2019   CAD (coronary artery disease) 03/2018   Ostial first diagonal 60% narrowed, otherwise normal coronaries. Dr. Pietro added ASA and statin 08/2018.   Depression    Diabetes mellitus    False positive stress test 03/2018   Cath showed normal coronaries with EF 50%   GERD (gastroesophageal reflux disease)    Hyperlipidemia    Hypertension  Hypogonadism, male 05/2015   Increased prostate specific antigen (PSA) velocity 2019   06/2017 velocity up; recheck 10/2017 back to baseline.  Plan repeat 1 yr.   Low back pain 2016   Left lumbar radiculopathy, lumbar spondylolisthesis.  Sciatica episode 03/2016.  Ortho getting L spine MRI as of 07/2016.   Microcytic anemia 12/2015   suspect iron def due to malabsorption.  Hemoccults NEG 01/26/16, ferrous sulfate started.   Neuropathic pain of left foot    NICM (nonischemic cardiomyopathy) (HCC) 05/2018   EF 40-45%, +LV hypokinesis. Suspected to be tachycardia-mediated->Dr. Pietro to rpt echo fall 2020.   OSA on CPAP    cpap settings at 3   Peyronie's disease 2021   Dr. 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 Cindie   S/P ablation of atrial fibrillation 12/08/2022    Past Surgical History:  Procedure Laterality Date   A-FLUTTER ABLATION N/A 07/09/2018   Procedure: A-FLUTTER ABLATION;  Surgeon: Waddell Danelle ORN, MD;  Location: [facility] INVASIVE CV LAB;  Service: Cardiovascular;  Laterality: N/A;   ADENOIDECTOMY     ATRIAL FIBRILLATION ABLATION N/A 12/08/2022   Procedure: ATRIAL FIBRILLATION ABLATION;  Surgeon: Cindie Ole DASEN, MD;  Location: [facility] INVASIVE CV LAB;  Service: Cardiovascular;  Laterality: N/A;   BREATH TEK H PYLORI N/A 01/08/2013   Procedure: BREATH TEK H PYLORI;  Surgeon: Donnice KATHEE Lunger, MD;  Location: [facility];  Service: General;  Laterality: N/A;   CARDIAC CATHETERIZATION  03/2018   No signi obstructive CAD (suspected FALSE POSITIVE STRESS TEST).  EF 50%.  Diastolic dysfunction.   CARDIOVASCULAR STRESS TEST  03/2018   Intermediate risk; EF 40%, medium sized defect with peri-infarct ischemia-->follow up cath showed no signif obstructive CAD.   COLONOSCOPY  04/17/2014   Benign polyp x 1.  Recall 10 yrs.   GASTRIC ROUX-EN-Y N/A 04/14/2013   Procedure: LAPAROSCOPIC ROUX-EN-Y GASTRIC BYPASS WITH UPPER ENDOSCOPY;  Surgeon: Donnice KATHEE Lunger, MD;  Location: [facility];  Service: General;  Laterality: N/A;   LEFT ATRIAL APPENDAGE OCCLUSION N/A 12/08/2022   Procedure: LEFT ATRIAL APPENDAGE OCCLUSION;  Surgeon: Cindie Ole DASEN, MD;  Location: [facility] INVASIVE CV LAB;  Service: Cardiovascular;  Laterality: N/A;   LEFT HEART CATH AND CORONARY ANGIOGRAPHY N/A 03/22/2018   NO CAD.  EF 50%. Procedure: LEFT HEART CATH AND CORONARY ANGIOGRAPHY;  Surgeon: Claudene Victory ORN, MD;  Location: [facility] INVASIVE CV LAB;  Service: Cardiovascular;  Laterality: N/A;   Rhythm monitoring  01/08/2019   7 day monitor->Sinus rhythm with occasional PAC, PVC, paroxysmal atrial flutter,  paroxysmal atrial fibrillation and 7 beats nonsustained ventricular tachycardia: xarelto  restarted   TONSILLECTOMY     TRANSESOPHAGEAL ECHOCARDIOGRAM (CATH LAB) N/A 12/08/2022   Procedure: TRANSESOPHAGEAL ECHOCARDIOGRAM;  Surgeon: Cindie Ole DASEN, MD;  Location: [facility] INVASIVE CV LAB;  Service: Cardiovascular;  Laterality: N/A;   TRANSTHORACIC ECHOCARDIOGRAM  05/13/2018;12/03/18   05/2018 severe hypokinesis of LV inferior and inferolateral walls, EF 40-45%, correlates with stress test.  12/2018->EF 40-45%, diastolic fxn/LV filling pressure not determined due to pt in a fib/flutter    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EMT    Employer: [employer]  Tobacco Use   Smoking status: Never   Smokeless tobacco: Former    Types: Chew    Quit date: 02/06/2010   Tobacco comments:    Former Chew 07/07/21  Substance and Sexual Activity   Alcohol use: Yes    Alcohol/week: 18.0 standard drinks of alcohol    Types: 18 Cans of beer per week    Comment: 2-3 beers daily 07/07/21   Drug use: No   Sexual activity: Yes  Other Topics Concern   Not on file  Social History Narrative   Married, no children.   Occupation: paramedic   Social Drivers of Health   Financial Resource Strain: Low Risk  (03/27/2023)   Received from [facility]   Overall Financial Resource Strain (CARDIA)    Difficulty of Paying Living Expenses: Not hard at all  Food Insecurity: No Food Insecurity (03/27/2023)   Received from [facility]   Hunger Vital Sign    Within the past 12 months, you worried that your food would run out before you got the money to buy more.: Never true    Within the past 12 months, the food you bought just didn't last and you didn't have money to get more.: Never true   Transportation Needs: No Transportation Needs (03/27/2023)   Received from [facility] - Transportation    Lack of Transportation (Medical): No    Lack of Transportation (Non-Medical): No  Physical Activity: Inactive (09/26/2022)   Received from [facility]   Exercise Vital Sign    On average, how many days per week do you engage in moderate to strenuous exercise (like a brisk walk)?: 1 day    On average, how many minutes do you engage in exercise at this level?: 0 min  Stress: Stress Concern Present (09/26/2022)   Received from [facility] - Occupational Stress Questionnaire    Feeling of Stress : To some extent  Social Connections: Socially Integrated (09/26/2022)   Received from [facility]   Social Network    How would you rate your social network (family, work, friends)?: Good participation with social networks  Intimate Partner Violence: Not At Risk (01/06/2023)   Received from [facility]   HITS    Over the last 12 months how often did your partner physically hurt you?: Never    Over the last 12 months how often did your partner insult you or talk down to you?: Never    Over the last 12 months how often did your partner threaten you with physical harm?: Never    Over the last 12 months how often did your partner scream or curse at you?: Never    Family History  Problem Relation Age of Onset   Heart disease Father        valvular disease   COPD Father    Prostate cancer Father    Arrhythmia Father    Colon cancer Neg Hx     ROS: no fevers or chills, productive cough, hemoptysis, dysphasia, odynophagia, melena, hematochezia, dysuria, hematuria, rash, seizure activity, orthopnea, PND, pedal edema, claudication. Remaining systems are negative.  Physical Exam: Well-developed well-nourished in no acute distress.  Skin is warm and dry.  HEENT is normal.  Neck is supple.  Chest is clear to auscultation with normal  expansion.  Cardiovascular exam is regular rate and rhythm.  Abdominal exam nontender or distended. No masses palpated. Extremities show no edema. neuro grossly intact  ECG-  personally reviewed  A/P  1 paroxysmal atrial fibrillation/flutter-status post PVI as well as watchman placement.  Remains in sinus rhythm.  Continue carvedilol .  2 history of nonischemic cardiomyopathy-LV function improved on most recent echocardiogram.  Continue carvedilol , Lasix  and Jardiance  at present dose.  3 hypertension-patient's blood pressure is controlled.  Continue present medications.  4 coronary artery disease-continue aspirin  and statin.  He denies chest pain.  5 morbid obesity-we again discussed the importance of weight loss.  6 obstructive sleep apnea-continue CPAP.  Redell Shallow, MD

## 2023-12-07 ENCOUNTER — Ambulatory Visit: Attending: Cardiology | Admitting: Cardiology

## 2023-12-10 ENCOUNTER — Telehealth: Payer: Self-pay

## 2023-12-10 ENCOUNTER — Encounter: Payer: Self-pay | Admitting: Cardiology

## 2023-12-10 NOTE — Telephone Encounter (Signed)
 Patient s/p LAAO 12/08/2022.   Attempted to call patient to see how he is doing 1 year post implant. Left message for return call.   Patient no showed OV 12/5 with Dr. Pietro.

## 2023-12-31 NOTE — Telephone Encounter (Signed)
 Called to check in with patient, who had LAAO on 12/08/2022. The patient reports doing well with no issues.  Rescheduled OV with Dr. Pietro for 01/30/24. The patient understands to call with questions or concerns.

## 2024-01-17 NOTE — Progress Notes (Unsigned)
 "    HPI: Follow-up atrial fibrillation/flutter and cardiomyopathy.  Patient diagnosed with atrial flutter February 2020. Cath 3/20 showed EF 50 60 D1 and otherwise normal coronaries. Had atrial flutter ablation 7/20.  Echocardiogram February 2024 showed ejection fraction 50 to 55%, severe left ventricular enlargement.  CTA November 2024 showed calcium  score 758 which was 95th percentile.  Had atrial fibrillation ablation and watchman implanted December 2024.  TEE during that procedure showed normal LV function, mild mitral regurgitation and trace aortic insufficiency.  Follow-up CTA February 2025 showed well-positioned Watchman device with no leak.  Since last seen,   Current Outpatient Medications  Medication Sig Dispense Refill   ALPRAZolam  (XANAX ) 0.5 MG tablet Take 1 tablet (0.5 mg total) by mouth at bedtime as needed for anxiety or sleep. 30 tablet 0   amLODipine  (NORVASC ) 5 MG tablet Take 1 tablet by mouth once daily 90 tablet 0   amoxicillin  (AMOXIL ) 500 MG capsule Take 4 capsules (2,000 mg total) by mouth as directed. Take 4 tablets 1 hour prior to dental work, including cleanings. 12 capsule 0   aspirin  EC 81 MG tablet Take 1 tablet (81 mg total) by mouth daily. Swallow whole.     atorvastatin  (LIPITOR) 20 MG tablet Take 20 mg by mouth every evening.     carvedilol  (COREG ) 25 MG tablet Take 25 mg by mouth 2 (two) times daily.     furosemide  (LASIX ) 40 MG tablet Take 40 mg by mouth daily as needed for fluid. (Patient taking differently: Take 40 mg by mouth as needed for fluid.)     Insulin  Pen Needle 31G X 5 MM MISC Use with Victoza  as directed 100 each 11   JARDIANCE  25 MG TABS tablet Take 25 mg by mouth in the morning.     Magnesium  Oxide 400 MG CAPS Take 1 capsule (400 mg total) by mouth 2 (two) times daily. 180 capsule 2   metFORMIN  (GLUCOPHAGE ) 1000 MG tablet TAKE 1 TABLET BY MOUTH TWICE DAILY WITH A MEAL (Patient taking differently: Take 1,000 mg by mouth daily with breakfast.) 180  tablet 3   MOUNJARO 15 MG/0.5ML Pen Inject 15 mg into the skin every Thursday.     pregabalin  (LYRICA ) 150 MG capsule Take 1 capsule by mouth twice daily (Patient taking differently: Take 150 mg by mouth 3 (three) times daily.) 60 capsule 5   spironolactone  (ALDACTONE ) 25 MG tablet Take 1 tablet by mouth once daily 90 tablet 3   tamsulosin  (FLOMAX ) 0.4 MG CAPS capsule Take 0.4 mg by mouth at bedtime.     testosterone  cypionate (DEPOTESTOSTERONE CYPIONATE) 200 MG/ML injection 1 ml IM q 14 days (Patient not taking: Reported on 09/06/2023) 10 mL 0   zaleplon  (SONATA ) 5 MG capsule TAKE 1 CAPSULE BY MOUTH AT BEDTIME AS NEEDED FOR SLEEP 90 capsule 1   No current facility-administered medications for this visit.     Past Medical History:  Diagnosis Date   Anxiety    Arthritis    lower back    Atrial flutter (HCC) 2020   Cards->lopressor  and anticoag, ablation. In sinus still as of 08/2018 cards f/u.  Recurrence + PAF->requiring pt to get back on xarelto    BPH with obstruction/lower urinary tract symptoms    started flomax  via urol 03/2019   CAD (coronary artery disease) 03/2018   Ostial first diagonal 60% narrowed, otherwise normal coronaries. Dr. Pietro added ASA and statin 08/2018.   Depression    Diabetes mellitus    False positive stress  test 03/2018   Cath showed normal coronaries with EF 50%   GERD (gastroesophageal reflux disease)    Hyperlipidemia    Hypertension    Hypogonadism, male 05/2015   Increased prostate specific antigen (PSA) velocity 2019   06/2017 velocity up; recheck 10/2017 back to baseline.  Plan repeat 1 yr.   Low back pain 2016   Left lumbar radiculopathy, lumbar spondylolisthesis.  Sciatica episode 03/2016.  Ortho getting L spine MRI as of 07/2016.   Microcytic anemia 12/2015   suspect iron def due to malabsorption.  Hemoccults NEG 01/26/16, ferrous sulfate started.   Neuropathic pain of left foot    NICM (nonischemic cardiomyopathy) (HCC) 05/2018   EF 40-45%, +LV  hypokinesis. Suspected to be tachycardia-mediated->Dr. Pietro to rpt echo fall 2020.   OSA on CPAP    cpap settings at 3   Peyronie's disease 2021   Dr. 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 Cindie   S/P ablation of atrial fibrillation 12/08/2022    Past Surgical History:  Procedure Laterality Date   A-FLUTTER ABLATION N/A 07/09/2018   Procedure: A-FLUTTER ABLATION;  Surgeon: Waddell Danelle ORN, MD;  Location: [facility] INVASIVE CV LAB;  Service: Cardiovascular;  Laterality: N/A;   ADENOIDECTOMY     ATRIAL FIBRILLATION ABLATION N/A 12/08/2022   Procedure: ATRIAL FIBRILLATION ABLATION;  Surgeon: Cindie Ole DASEN, MD;  Location: [facility] INVASIVE CV LAB;  Service: Cardiovascular;  Laterality: N/A;   BREATH TEK H PYLORI N/A 01/08/2013   Procedure: BREATH TEK H PYLORI;  Surgeon: Donnice KATHEE Lunger, MD;  Location: [facility];  Service: General;  Laterality: N/A;   CARDIAC CATHETERIZATION  03/2018   No signi obstructive CAD (suspected FALSE POSITIVE STRESS TEST).  EF 50%.  Diastolic dysfunction.   CARDIOVASCULAR STRESS TEST  03/2018   Intermediate risk; EF 40%, medium sized defect with peri-infarct ischemia-->follow up cath showed no signif obstructive CAD.   COLONOSCOPY  04/17/2014   Benign polyp x 1.  Recall 10 yrs.   GASTRIC ROUX-EN-Y N/A 04/14/2013   Procedure: LAPAROSCOPIC ROUX-EN-Y GASTRIC BYPASS WITH UPPER ENDOSCOPY;  Surgeon: Donnice KATHEE Lunger, MD;  Location: [facility];  Service: General;  Laterality: N/A;   LEFT ATRIAL APPENDAGE OCCLUSION N/A 12/08/2022   Procedure: LEFT ATRIAL APPENDAGE OCCLUSION;  Surgeon: Cindie Ole DASEN, MD;  Location: [facility] INVASIVE CV LAB;  Service: Cardiovascular;  Laterality: N/A;   LEFT HEART CATH AND CORONARY ANGIOGRAPHY N/A 03/22/2018   NO CAD.  EF 50%. Procedure: LEFT HEART CATH AND CORONARY ANGIOGRAPHY;  Surgeon: Claudene Victory ORN, MD;  Location: [facility] INVASIVE  CV LAB;  Service: Cardiovascular;  Laterality: N/A;   Rhythm monitoring  01/08/2019   7 day monitor->Sinus rhythm with occasional PAC, PVC, paroxysmal atrial flutter, paroxysmal atrial fibrillation and 7 beats nonsustained ventricular tachycardia: xarelto  restarted   TONSILLECTOMY     TRANSESOPHAGEAL ECHOCARDIOGRAM (CATH LAB) N/A 12/08/2022   Procedure: TRANSESOPHAGEAL ECHOCARDIOGRAM;  Surgeon: Cindie Ole DASEN, MD;  Location: [facility] INVASIVE CV LAB;  Service: Cardiovascular;  Laterality: N/A;   TRANSTHORACIC ECHOCARDIOGRAM  05/13/2018;12/03/18   05/2018 severe hypokinesis of LV inferior and inferolateral walls, EF 40-45%, correlates with stress test.  12/2018->EF 40-45%, diastolic fxn/LV filling pressure not determined due to pt in a fib/flutter    Social History   Socioeconomic History   Marital status: Divorced    Spouse name: Not on file   Number of children: Not on file  Years of education: Not on file   Highest education level: Not on file  Occupational History   Occupation: Paramedic-EMT    Employer: [employer]  Tobacco Use   Smoking status: Never   Smokeless tobacco: Former    Types: Chew    Quit date: 02/06/2010   Tobacco comments:    Former Chew 07/07/21  Substance and Sexual Activity   Alcohol use: Yes    Alcohol/week: 18.0 standard drinks of alcohol    Types: 18 Cans of beer per week    Comment: 2-3 beers daily 07/07/21   Drug use: No   Sexual activity: Yes  Other Topics Concern   Not on file  Social History Narrative   Married, no children.   Occupation: paramedic   Social Drivers of Health   Tobacco Use: Medium Risk (06/04/2023)   Received from [facility]   Patient History    Smoking Tobacco Use: Never    Smokeless Tobacco Use: Former    Passive Exposure: Never  Physicist, Medical Strain: Low Risk (03/27/2023)   Received from [facility]   Overall Financial Resource Strain (CARDIA)    Difficulty of Paying Living Expenses: Not hard at all  Food  Insecurity: No Food Insecurity (03/27/2023)   Received from [facility]   Epic    Within the past 12 months, you worried that your food would run out before you got the money to buy more.: Never true    Within the past 12 months, the food you bought just didn't last and you didn't have money to get more.: Never true  Transportation Needs: No Transportation Needs (03/27/2023)   Received [facility] - [facility]    Lack of Transportation (Medical): No    Lack of Transportation (Non-Medical): No  Physical Activity: Inactive (09/26/2022)   Received from [facility]   Exercise Vital Sign    On average, how many days per week do you engage in moderate to strenuous exercise (like a brisk walk)?: 1 day    On average, how many minutes do you engage in exercise at this level?: 0 min  Stress: Stress Concern Present (09/26/2022)   Received from [facility] of [facility]ational [facility] - Occupational Stress Questionnaire    Feeling of Stress : To some extent  Social Connections: Socially Integrated (09/26/2022)   Received from [facility]   Social Network    How would you rate your social network (family, work, friends)?: Good participation with social networks  Intimate Partner Violence: Not At Risk (01/06/2023)   Received from [facility]   HITS    Over the last 12 months how often did your partner physically hurt you?: Never    Over the last 12 months how often did your partner insult you or talk down to you?: Never    Over the last 12 months how often did your partner threaten you with physical harm?: Never    Over the last 12 months how often did your partner scream or curse at you?: Never  Depression (PHQ2-9): Not on file  Alcohol Screen: Not on file  Housing: Low Risk (03/27/2023)   Received from [facility]    In the last 12 months, was there a time when you were not able to pay the mortgage or rent on time?: No    In the past 12 months,  how many times have you moved where you were living?: 0  At any time in the past 12 months, were you homeless or living in a shelter (including now)?: No  Utilities: Not At Risk (03/27/2023)   Received from [facility] Utilities    Threatened with loss of utilities: No  Health Literacy: Not on file    Family History  Problem Relation Age of Onset   Heart disease Father        valvular disease   COPD Father    Prostate cancer Father    Arrhythmia Father    Colon cancer Neg Hx     ROS: no fevers or chills, productive cough, hemoptysis, dysphasia, odynophagia, melena, hematochezia, dysuria, hematuria, rash, seizure activity, orthopnea, PND, pedal edema, claudication. Remaining systems are negative.  Physical Exam: Well-developed well-nourished in no acute distress.  Skin is warm and dry.  HEENT is normal.  Neck is supple.  Chest is clear to auscultation with normal expansion.  Cardiovascular exam is regular rate and rhythm.  Abdominal exam nontender or distended. No masses palpated. Extremities show no edema. neuro grossly intact  ECG- personally reviewed  A/P  1 paroxysmal atrial fibrillation/flutter-patient is status post ablation.  He remains in sinus rhythm and his Tikosyn  was discontinued September 2025.  Continue carvedilol .  He has also had a previous watchman placed and is therefore not on anticoagulation.  2 history of nonischemic cardiomyopathy-LV function normal on most recent echocardiogram.  3 coronary calcification-patient denies chest pain.  Continue aspirin  and statin.  4 hypertension-patient's blood pressure is controlled.  Continue present medical regimen.  5 obstructive sleep apnea-continue CPAP.  6 morbid obesity-  Redell Shallow, MD    "

## 2024-01-25 ENCOUNTER — Other Ambulatory Visit: Payer: Self-pay | Admitting: Internal Medicine

## 2024-01-30 ENCOUNTER — Ambulatory Visit: Attending: Cardiovascular Disease | Admitting: Cardiology
